# Patient Record
Sex: Female | Born: 1945
Health system: Southern US, Community
[De-identification: ages and names within clinical notes are randomized; demographics above are authoritative.]

## PROBLEM LIST (undated history)

## (undated) DIAGNOSIS — M069 Rheumatoid arthritis, unspecified: Secondary | ICD-10-CM

## (undated) DIAGNOSIS — I10 Essential (primary) hypertension: Secondary | ICD-10-CM

## (undated) DIAGNOSIS — D649 Anemia, unspecified: Secondary | ICD-10-CM

## (undated) DIAGNOSIS — K56609 Unspecified intestinal obstruction, unspecified as to partial versus complete obstruction: Secondary | ICD-10-CM

## (undated) DIAGNOSIS — Z720 Tobacco use: Secondary | ICD-10-CM

## (undated) DIAGNOSIS — E079 Disorder of thyroid, unspecified: Secondary | ICD-10-CM

## (undated) DIAGNOSIS — C189 Malignant neoplasm of colon, unspecified: Secondary | ICD-10-CM

## (undated) DIAGNOSIS — I77819 Aortic ectasia, unspecified site: Secondary | ICD-10-CM

## (undated) DIAGNOSIS — M3500 Sicca syndrome, unspecified: Secondary | ICD-10-CM

## (undated) DIAGNOSIS — E785 Hyperlipidemia, unspecified: Secondary | ICD-10-CM

## (undated) DIAGNOSIS — I2510 Atherosclerotic heart disease of native coronary artery without angina pectoris: Secondary | ICD-10-CM

## (undated) DIAGNOSIS — K760 Fatty (change of) liver, not elsewhere classified: Secondary | ICD-10-CM

## (undated) DIAGNOSIS — Z8679 Personal history of other diseases of the circulatory system: Secondary | ICD-10-CM

## (undated) DIAGNOSIS — I319 Disease of pericardium, unspecified: Secondary | ICD-10-CM

## (undated) DIAGNOSIS — Z148 Genetic carrier of other disease: Secondary | ICD-10-CM

## (undated) HISTORY — DX: Genetic carrier of other disease: Z14.8

## (undated) HISTORY — DX: Personal history of other diseases of the circulatory system: Z86.79

## (undated) HISTORY — PX: COLON SURGERY: SHX602

## (undated) HISTORY — DX: Disorder of thyroid, unspecified: E07.9

## (undated) HISTORY — DX: Tobacco use: Z72.0

## (undated) HISTORY — DX: Aortic ectasia, unspecified site: I77.819

## (undated) HISTORY — DX: Anemia, unspecified: D64.9

## (undated) HISTORY — DX: Sicca syndrome, unspecified: M35.00

## (undated) HISTORY — DX: Hyperlipidemia, unspecified: E78.5

## (undated) HISTORY — DX: Atherosclerotic heart disease of native coronary artery without angina pectoris: I25.10

## (undated) HISTORY — DX: Fatty (change of) liver, not elsewhere classified: K76.0

## (undated) HISTORY — DX: Unspecified intestinal obstruction, unspecified as to partial versus complete obstruction: K56.609

## (undated) HISTORY — DX: Disease of pericardium, unspecified: I31.9

---

## 1973-08-25 HISTORY — PX: DILATION AND CURETTAGE OF UTERUS: SHX78

## 1999-08-26 HISTORY — PX: KNEE SURGERY: SHX244

## 2004-08-25 HISTORY — PX: KNEE SURGERY: SHX244

## 2009-06-05 ENCOUNTER — Ambulatory Visit: Payer: Self-pay | Admitting: Family

## 2009-06-05 ENCOUNTER — Telehealth: Payer: Self-pay | Admitting: Family

## 2009-06-05 DIAGNOSIS — Z87891 Personal history of nicotine dependence: Secondary | ICD-10-CM

## 2009-06-05 DIAGNOSIS — E785 Hyperlipidemia, unspecified: Secondary | ICD-10-CM

## 2009-06-05 DIAGNOSIS — D510 Vitamin B12 deficiency anemia due to intrinsic factor deficiency: Secondary | ICD-10-CM

## 2009-06-05 DIAGNOSIS — M069 Rheumatoid arthritis, unspecified: Secondary | ICD-10-CM

## 2009-06-05 DIAGNOSIS — K219 Gastro-esophageal reflux disease without esophagitis: Secondary | ICD-10-CM

## 2009-06-05 DIAGNOSIS — E782 Mixed hyperlipidemia: Secondary | ICD-10-CM | POA: Insufficient documentation

## 2009-06-05 DIAGNOSIS — E039 Hypothyroidism, unspecified: Secondary | ICD-10-CM

## 2009-06-05 HISTORY — DX: Hypothyroidism, unspecified: E03.9

## 2009-06-05 HISTORY — DX: Rheumatoid arthritis, unspecified: M06.9

## 2009-06-05 HISTORY — DX: Vitamin B12 deficiency anemia due to intrinsic factor deficiency: D51.0

## 2009-06-05 HISTORY — DX: Personal history of nicotine dependence: Z87.891

## 2009-06-05 HISTORY — DX: Gastro-esophageal reflux disease without esophagitis: K21.9

## 2009-06-05 HISTORY — DX: Mixed hyperlipidemia: E78.2

## 2009-06-06 ENCOUNTER — Encounter: Payer: Self-pay | Admitting: Family

## 2009-06-06 LAB — CONVERTED CEMR LAB
Calcium: 8.9 mg/dL (ref 8.4–10.5)
Cholesterol: 168 mg/dL (ref 0–200)
Creatinine, Ser: 0.72 mg/dL (ref 0.40–1.20)
Eosinophils Absolute: 0.2 10*3/uL (ref 0.0–0.7)
Eosinophils Relative: 3 % (ref 0–5)
HDL: 35 mg/dL — ABNORMAL LOW (ref >39)
LDL Cholesterol: 96 mg/dL (ref 0–99)
Lymphs Abs: 1.2 10*3/uL (ref 0.7–4.0)
MCHC: 31.9 g/dL (ref 30.0–36.0)
MCV: 96.8 fL (ref 78.0–100.0)
Neutro Abs: 4.1 10*3/uL (ref 1.7–7.7)
Platelets: 228 10*3/uL (ref 150–400)
Potassium: 4.5 meq/L (ref 3.5–5.3)
RBC: 4.72 M/uL (ref 3.87–5.11)
RDW: 16.1 % — ABNORMAL HIGH (ref 11.5–15.5)
Sodium: 139 meq/L (ref 135–145)
TSH: 0.116 microintl units/mL — ABNORMAL LOW (ref 0.350–4.500)
Triglycerides: 184 mg/dL — ABNORMAL HIGH (ref <150)
VLDL: 37 mg/dL (ref 0–40)
Vitamin B-12: 440 pg/mL (ref 211–911)

## 2009-06-08 ENCOUNTER — Telehealth: Payer: Self-pay | Admitting: Family

## 2009-06-08 ENCOUNTER — Encounter: Payer: Self-pay | Admitting: Family

## 2009-07-23 ENCOUNTER — Encounter: Payer: Self-pay | Admitting: Family

## 2009-07-23 ENCOUNTER — Ambulatory Visit: Payer: Self-pay | Admitting: Internal Medicine

## 2009-07-23 LAB — CONVERTED CEMR LAB: TSH: 1.119 microintl units/mL (ref 0.350–4.500)

## 2009-07-26 ENCOUNTER — Encounter: Payer: Self-pay | Admitting: Family

## 2009-09-03 ENCOUNTER — Telehealth (INDEPENDENT_AMBULATORY_CARE_PROVIDER_SITE_OTHER): Payer: Self-pay | Admitting: *Deleted

## 2009-09-05 ENCOUNTER — Telehealth: Payer: Self-pay | Admitting: Family

## 2009-09-10 ENCOUNTER — Ambulatory Visit: Payer: Self-pay | Admitting: Family

## 2009-09-10 DIAGNOSIS — F329 Major depressive disorder, single episode, unspecified: Secondary | ICD-10-CM

## 2009-09-10 DIAGNOSIS — I10 Essential (primary) hypertension: Secondary | ICD-10-CM | POA: Insufficient documentation

## 2009-09-10 DIAGNOSIS — R030 Elevated blood-pressure reading, without diagnosis of hypertension: Secondary | ICD-10-CM

## 2009-09-10 DIAGNOSIS — J309 Allergic rhinitis, unspecified: Secondary | ICD-10-CM

## 2009-09-10 DIAGNOSIS — E119 Type 2 diabetes mellitus without complications: Secondary | ICD-10-CM

## 2009-09-10 DIAGNOSIS — F32A Depression, unspecified: Secondary | ICD-10-CM

## 2009-09-10 HISTORY — DX: Type 2 diabetes mellitus without complications: E11.9

## 2009-09-10 HISTORY — DX: Depression, unspecified: F32.A

## 2009-09-10 HISTORY — DX: Allergic rhinitis, unspecified: J30.9

## 2009-09-10 LAB — CONVERTED CEMR LAB
Albumin: 4.2 g/dL (ref 3.5–5.2)
Alkaline Phosphatase: 80 units/L (ref 39–117)
Basophils Absolute: 0 10*3/uL (ref 0.0–0.1)
Basophils Relative: 0 % (ref 0–1)
Calcium: 9.1 mg/dL (ref 8.4–10.5)
Creatinine, Ser: 0.74 mg/dL (ref 0.40–1.20)
Eosinophils Absolute: 0.1 10*3/uL (ref 0.0–0.7)
Eosinophils Relative: 3 % (ref 0–5)
HCT: 41.5 % (ref 36.0–46.0)
Lymphocytes Relative: 30 % (ref 12–46)
Lymphs Abs: 1.5 10*3/uL (ref 0.7–4.0)
Monocytes Absolute: 0.5 10*3/uL (ref 0.1–1.0)
Monocytes Relative: 9 % (ref 3–12)
Neutrophils Relative %: 58 % (ref 43–77)
RBC: 4.56 M/uL (ref 3.87–5.11)
RDW: 15.8 % — ABNORMAL HIGH (ref 11.5–15.5)
Total Bilirubin: 0.3 mg/dL (ref 0.3–1.2)
Total Protein: 7.5 g/dL (ref 6.0–8.3)
Vitamin B-12: >2000 pg/mL — ABNORMAL HIGH (ref 211–911)
WBC: 4.9 10*3/uL (ref 4.0–10.5)

## 2009-09-11 ENCOUNTER — Encounter: Payer: Self-pay | Admitting: Family

## 2009-09-11 ENCOUNTER — Telehealth (INDEPENDENT_AMBULATORY_CARE_PROVIDER_SITE_OTHER): Payer: Self-pay | Admitting: *Deleted

## 2009-10-01 ENCOUNTER — Ambulatory Visit: Payer: Self-pay | Admitting: Family

## 2009-10-09 ENCOUNTER — Ambulatory Visit: Payer: Self-pay | Admitting: Family

## 2009-11-02 ENCOUNTER — Encounter: Payer: Self-pay | Admitting: Family

## 2009-11-12 ENCOUNTER — Encounter: Payer: Self-pay | Admitting: Family

## 2009-11-15 ENCOUNTER — Telehealth: Payer: Self-pay | Admitting: Family

## 2009-11-16 ENCOUNTER — Ambulatory Visit: Payer: Self-pay | Admitting: Family

## 2009-11-16 ENCOUNTER — Ambulatory Visit: Payer: Self-pay | Admitting: Diagnostic Radiology

## 2009-11-16 ENCOUNTER — Ambulatory Visit (HOSPITAL_BASED_OUTPATIENT_CLINIC_OR_DEPARTMENT_OTHER): Admission: RE | Admit: 2009-11-16 | Discharge: 2009-11-16 | Payer: Self-pay | Admitting: Internal Medicine

## 2009-11-16 ENCOUNTER — Telehealth: Payer: Self-pay | Admitting: Family

## 2009-11-19 ENCOUNTER — Encounter (INDEPENDENT_AMBULATORY_CARE_PROVIDER_SITE_OTHER): Payer: Self-pay | Admitting: *Deleted

## 2009-11-19 ENCOUNTER — Telehealth: Payer: Self-pay | Admitting: Family

## 2009-11-19 ENCOUNTER — Ambulatory Visit: Payer: Self-pay | Admitting: Family

## 2009-11-20 ENCOUNTER — Encounter: Payer: Self-pay | Admitting: Family

## 2009-11-20 ENCOUNTER — Telehealth: Payer: Self-pay | Admitting: Family

## 2009-11-27 ENCOUNTER — Encounter: Payer: Self-pay | Admitting: Family

## 2009-11-28 ENCOUNTER — Encounter: Admission: RE | Admit: 2009-11-28 | Discharge: 2009-11-28 | Payer: Self-pay | Admitting: Internal Medicine

## 2009-11-28 LAB — HM MAMMOGRAPHY

## 2009-12-23 LAB — HM DEXA SCAN

## 2009-12-26 ENCOUNTER — Ambulatory Visit: Payer: Self-pay | Admitting: Family

## 2009-12-26 DIAGNOSIS — J45909 Unspecified asthma, uncomplicated: Secondary | ICD-10-CM

## 2009-12-26 HISTORY — DX: Unspecified asthma, uncomplicated: J45.909

## 2009-12-26 LAB — CONVERTED CEMR LAB
ALT: 24 units/L (ref 0–35)
Albumin: 4.5 g/dL (ref 3.5–5.2)
Alkaline Phosphatase: 76 units/L (ref 39–117)
BUN: 13 mg/dL (ref 6–23)
CO2: 25 meq/L (ref 19–32)
Calcium: 9.3 mg/dL (ref 8.4–10.5)
Chloride: 103 meq/L (ref 96–112)
Glucose, Bld: 88 mg/dL (ref 70–99)
HDL: 32 mg/dL — ABNORMAL LOW (ref >39)
Hgb A1c MFr Bld: 6.1 % — ABNORMAL HIGH (ref <5.7)
LDL Cholesterol: 112 mg/dL — ABNORMAL HIGH (ref 0–99)
Potassium: 4.7 meq/L (ref 3.5–5.3)
Sodium: 138 meq/L (ref 135–145)
Total Bilirubin: 0.4 mg/dL (ref 0.3–1.2)
VLDL: 38 mg/dL (ref 0–40)

## 2009-12-27 ENCOUNTER — Encounter: Payer: Self-pay | Admitting: Family

## 2009-12-27 ENCOUNTER — Telehealth: Payer: Self-pay | Admitting: Family

## 2010-01-02 ENCOUNTER — Encounter: Payer: Self-pay | Admitting: Family

## 2010-03-04 ENCOUNTER — Telehealth: Payer: Self-pay | Admitting: Family

## 2010-03-05 ENCOUNTER — Encounter: Payer: Self-pay | Admitting: Family

## 2010-03-06 LAB — CONVERTED CEMR LAB
ALT: 24 units/L (ref 0–35)
AST: 22 units/L (ref 0–37)
Basophils Absolute: 0 10*3/uL (ref 0.0–0.1)
Basophils Relative: 1 % (ref 0–1)
Eosinophils Relative: 3 % (ref 0–5)
HCT: 45.6 % (ref 36.0–46.0)
Hemoglobin: 14.6 g/dL (ref 12.0–15.0)
MCHC: 32 g/dL (ref 30.0–36.0)
Monocytes Relative: 8 % (ref 3–12)
Neutro Abs: 3.3 10*3/uL (ref 1.7–7.7)
Neutrophils Relative %: 54 % (ref 43–77)
RDW: 16.1 % — ABNORMAL HIGH (ref 11.5–15.5)

## 2010-03-07 ENCOUNTER — Encounter: Payer: Self-pay | Admitting: Family

## 2010-03-11 ENCOUNTER — Encounter: Payer: Self-pay | Admitting: Family

## 2010-03-13 ENCOUNTER — Ambulatory Visit: Payer: Self-pay | Admitting: Family

## 2010-03-25 ENCOUNTER — Telehealth: Payer: Self-pay | Admitting: Family

## 2010-04-24 ENCOUNTER — Ambulatory Visit: Payer: Self-pay | Admitting: Family

## 2010-05-06 ENCOUNTER — Telehealth: Payer: Self-pay | Admitting: Family

## 2010-05-15 ENCOUNTER — Ambulatory Visit: Payer: Self-pay | Admitting: Family

## 2010-05-15 DIAGNOSIS — E559 Vitamin D deficiency, unspecified: Secondary | ICD-10-CM | POA: Insufficient documentation

## 2010-05-15 HISTORY — DX: Vitamin D deficiency, unspecified: E55.9

## 2010-05-15 LAB — CONVERTED CEMR LAB
Basophils Absolute: 0 10*3/uL (ref 0.0–0.1)
Eosinophils Absolute: 0.1 10*3/uL (ref 0.0–0.7)
Eosinophils Relative: 1 % (ref 0–5)
Hemoglobin: 15 g/dL (ref 12.0–15.0)
Lymphocytes Relative: 20 % (ref 12–46)
Lymphs Abs: 1.4 10*3/uL (ref 0.7–4.0)
MCHC: 34.2 g/dL (ref 30.0–36.0)
Monocytes Relative: 6 % (ref 3–12)
Neutrophils Relative %: 72 % (ref 43–77)
Platelets: 201 10*3/uL (ref 150–400)
RBC: 4.85 M/uL (ref 3.87–5.11)
RDW: 15.3 % (ref 11.5–15.5)
Vit D, 1,25-Dihydroxy: 47 (ref 30–89)
WBC: 7.1 10*3/uL (ref 4.0–10.5)

## 2010-05-17 ENCOUNTER — Encounter: Payer: Self-pay | Admitting: Family

## 2010-05-20 ENCOUNTER — Ambulatory Visit: Payer: Self-pay | Admitting: Family

## 2010-05-20 ENCOUNTER — Ambulatory Visit: Payer: Self-pay | Admitting: Diagnostic Radiology

## 2010-05-20 ENCOUNTER — Ambulatory Visit (HOSPITAL_BASED_OUTPATIENT_CLINIC_OR_DEPARTMENT_OTHER): Admission: RE | Admit: 2010-05-20 | Discharge: 2010-05-20 | Payer: Self-pay | Admitting: Internal Medicine

## 2010-05-20 LAB — CONVERTED CEMR LAB
Glucose, Urine, Semiquant: NEGATIVE
Specific Gravity, Urine: 1.01

## 2010-05-22 ENCOUNTER — Inpatient Hospital Stay (HOSPITAL_COMMUNITY): Admission: EM | Admit: 2010-05-22 | Discharge: 2010-05-25 | Payer: Self-pay | Admitting: Emergency Medicine

## 2010-05-22 ENCOUNTER — Ambulatory Visit: Payer: Self-pay | Admitting: Family

## 2010-06-03 ENCOUNTER — Ambulatory Visit: Payer: Self-pay | Admitting: Family

## 2010-06-04 ENCOUNTER — Encounter: Payer: Self-pay | Admitting: Family

## 2010-06-04 ENCOUNTER — Encounter: Payer: Self-pay | Admitting: Internal Medicine

## 2010-06-04 ENCOUNTER — Telehealth: Payer: Self-pay | Admitting: Family

## 2010-07-08 ENCOUNTER — Ambulatory Visit: Payer: Self-pay | Admitting: Family

## 2010-07-09 ENCOUNTER — Telehealth: Payer: Self-pay | Admitting: Family

## 2010-08-25 DIAGNOSIS — C189 Malignant neoplasm of colon, unspecified: Secondary | ICD-10-CM

## 2010-08-25 HISTORY — DX: Malignant neoplasm of colon, unspecified: C18.9

## 2010-09-10 ENCOUNTER — Encounter: Payer: Self-pay | Admitting: Family

## 2010-09-17 ENCOUNTER — Ambulatory Visit
Admission: RE | Admit: 2010-09-17 | Discharge: 2010-09-17 | Payer: Self-pay | Source: Home / Self Care | Attending: Family | Admitting: Family

## 2010-09-17 ENCOUNTER — Telehealth: Payer: Self-pay | Admitting: Family

## 2010-09-17 DIAGNOSIS — L989 Disorder of the skin and subcutaneous tissue, unspecified: Secondary | ICD-10-CM | POA: Insufficient documentation

## 2010-09-17 LAB — CONVERTED CEMR LAB
BUN: 16 mg/dL (ref 6–23)
CO2: 26 meq/L (ref 19–32)
Calcium: 9 mg/dL (ref 8.4–10.5)
Creatinine, Ser: 0.89 mg/dL (ref 0.40–1.20)
Glucose, Bld: 99 mg/dL (ref 70–99)
Hgb A1c MFr Bld: 6.2 % — ABNORMAL HIGH (ref <5.7)
Potassium: 4.6 meq/L (ref 3.5–5.3)

## 2010-09-18 ENCOUNTER — Encounter: Payer: Self-pay | Admitting: Family

## 2010-09-19 ENCOUNTER — Telehealth: Payer: Self-pay | Admitting: Family

## 2010-09-23 ENCOUNTER — Telehealth: Payer: Self-pay | Admitting: Family

## 2010-09-24 ENCOUNTER — Telehealth: Payer: Self-pay | Admitting: Family

## 2010-09-24 NOTE — Progress Notes (Signed)
Summary: b12 result, mailed letter  Phone Note Outgoing Call   Summary of Call: Please call patient and let her know B12 level was normal, but I would like to schedule a follow up B12 level drawn in 6 weeks (281). Thanks Initial call taken by: [name] FNP,  Dec 27, 2009 8:36 AM  Follow-up for Phone Call        Left message on machine to return my call.  Mervin Kung CMA  Dec 27, 2009 9:15 AM   Left message on machine to return my call.  Mervin Kung CMA  Dec 28, 2009 8:49 AM   Left message on machine to return my call.  Mervin Kung CMA  Dec 31, 2009 4:28 PM   Additional Follow-up for Phone Call Additional follow up Details #1::        Unable to reach pt by phone.  Letter mailed to pt. with Lab appt. and instructions.  Mervin Kung CMA  Jan 02, 2010 9:30 AM

## 2010-09-24 NOTE — Assessment & Plan Note (Signed)
Summary: nurse visit for tb test & CXR / tf,cma   Vital Signs:  Patient profile:   65 year old female Pulse rate:   88 / minute Pulse rhythm:   regular BP sitting:   100 / 76  (right arm) Cuff size:   large  Vitals Entered By: Mervin Kung CMA (November 16, 2009 1:46 PM) CC: Pt here for nurse visit to have PPD skin test and CXR per Dr. Corliss Skains to r/o TB.    CC:  Pt here for nurse visit to have PPD skin test and CXR per Dr. Corliss Skains to r/o TB. Marland Kitchen  Allergies: 1)  ! Levaquin   Impression & Recommendations:  Problem # 1:  RHEUMATOID ARTHRITIS (ICD-714.0)  Dr. Corliss Skains is requesting routine  PPD and CXR due to ongoing  immunosuppressant therapy.  Orders: CXR- 2view (CXR)  Complete Medication List: 1)  Advair Diskus 100-50 Mcg/dose Aepb (Fluticasone-salmeterol) .Marland Kitchen.. 1puff by mouth two times a day 2)  Ventolin Hfa 108 (90 Base) Mcg/act Aers (Albuterol sulfate) .Marland Kitchen.. 1 puff by mouth once daily as needed 3)  Methotrexate 2.5 Mg Tabs (Methotrexate sodium) .Marland Kitchen.. 10  tablets by mouth once a week 4)  Pantoprazole Sodium 40 Mg Tbec (Pantoprazole sodium) .... Take 1 tablet by mouth once a day 5)  Ambien Cr 12.5 Mg Cr-tabs (Zolpidem tartrate) .... Take 1 tab by mouth at bedtime 6)  Cyanocobalamin 1000 Mcg/ml Soln (Cyanocobalamin) .... Inject im once per month 7)  Diclofenac Sodium 75 Mg Tbec (Diclofenac sodium) .... Take 1 tablet by mouth two times a day as needed 8)  Fluticasone Propionate 50 Mcg/act Susp (Fluticasone propionate) .... 2 sprays each nostril once daily 9)  Zetia 10 Mg Tabs (Ezetimibe) .... Take 1 tablet by mouth once a day 10)  Levothroid 100 Mcg Tabs (Levothyroxine sodium) .... Take one tablet once daily 11)  Lidoderm 5 % Ptch (Lidocaine) .... Apply once daily for 12 hours a day as needed to affected areas  Other Orders: TB Skin Test [id number]) Admin 1st Vaccine (98119)    Immunizations Administered:  PPD Skin Test:    Vaccine Type: PPD    Site: right forearm  Mfr: San[employer]i Pasteur    Dose: 0.1 ml    Route: ID    Given by: Mervin Kung CMA    Exp. Date: 10/31/2011    Lot #: [id number]

## 2010-09-24 NOTE — Progress Notes (Signed)
  Phone Note Outgoing Call   Summary of Call: Please call patient and let her know that her TSH is normal she should continue current dose of thyroid medication and that I have reviewed the guidelines for the Zostavax, and we unfortunately will not be able to give it to her because it is a live vaccine which she cannot take while on her rheumatoid medications. Initial call taken by: [name] FNP,  July 09, 2010 9:17 AM     Appended Document:  Left message on machine to return my call.  Appended Document:  Left message on machine to return my call.  Appended Document:  Left detailed message on home phone re: thyroid result and zostavax per above note and to call if pt has any questions.

## 2010-09-24 NOTE — Letter (Signed)
Summary: Records Dated 10-13-03 thru 01-05-09/[facility]  Records Dated 10-13-03 thru 01-05-09/[facility], [place], [place]   Imported By: Lanelle Bal 09/06/2009 11:18:00  _____________________________________________________________________  External Attachment:    Type:   Image     Comment:   External Document

## 2010-09-24 NOTE — Letter (Signed)
Summary: Property Tax Form/[place] [employer]  Property Tax Form/[place] [employer]   Imported By: Lanelle Bal 06/14/2010 12:30:06  _____________________________________________________________________  External Attachment:    Type:   Image     Comment:   External Document

## 2010-09-24 NOTE — Progress Notes (Signed)
Summary: standing lab order:  sgot/sgpt/cbc diff/ albumin/ creat  Phone Note Call from Patient   Caller: Patient Call For: Shoaib Siefker  Summary of Call: she wants to have her blood work done before her appt on 7-20. Please fax order to the lab.  Dr Charlton Amor is sending an order for the labs he wants drawn  Initial call taken by: Roselle Locus,  March 04, 2010 2:43 PM  Follow-up for Phone Call        Spoke with nurse, she will refax the order.  Nicki Guadalajara Fergerson CMA Duncan Dull)  March 05, 2010 11:49 AM   Additional Follow-up for Phone Call Additional follow up Details #1::        Received order from Dr. Corliss Skains for CBC w/Diff, SGOT/SGPT, Creat / ALB--v58.69 every 6 - 8 weeks until January 2012.     Are there any other labs you want pt to have?  Nicki Guadalajara Fergerson CMA Duncan Dull)  March 05, 2010 1:15 PM     Additional Follow-up for Phone Call Additional follow up Details #2::    Could we please also add a B12 level? ( ICD-9 281) Follow-up by: [name] FNP,  March 05, 2010 4:25 PM  Additional Follow-up for Phone Call Additional follow up Details #3:: Details for Additional Follow-up Action Taken: Notified pt. we will send order to [facility]. Pt will go tomorrow. She is aware that labs will need to be repeated every 6 to 8 weeks. Pt has appt with Dr. Corliss Skains on Thursday. I advised pt that labs may not get to Dr Corliss Skains before her appt.  Pt voices understanding.  Lab order faxed to [facility]. Nicki Guadalajara Fergerson CMA Duncan Dull)  March 05, 2010 5:21 PM

## 2010-09-24 NOTE — Letter (Signed)
Summary: Primary Care Consult Scheduled Letter  [name] [place]  [address] [place], [place] [address]   Phone: [phone]  Fax: [phone]      11/19/2009 MRN: [id number]  Sheryl [name] [address] [place], [place]  [address]    Dear Ms. Mower,    We have scheduled an appointment for you.  At the recommendation of Sandford Craze, FNP, we have scheduled you for a Bone Density Scan with [facility] on 12-20-2009 at 9:00am.  Their address is [address] [place], [place] [address]. The office phone number is [phone].  If this appointment day and time is not convenient for you, please feel free to call the office of the doctor you are being referred to at the number listed above and reschedule the appointment.    It is important for you to keep your scheduled appointments. We are here to make sure you are given good patient care.   Thank you,    Renee, Patient Care Coordinator [place] [facility]

## 2010-09-24 NOTE — Assessment & Plan Note (Signed)
Summary: 2 DAY FOLLOW UP/MHF   Vital Signs:  Patient profile:   66 year old female O2 Sat:      93 % on Room air Temp:     98.3 degrees F oral Pulse rate:   91 / minute Pulse rhythm:   regular Resp:     26 per minute BP sitting:   140 / 80  (left arm) Cuff size:   large  Vitals Entered By: Mervin Kung CMA (AAMA) (May 22, 2010 10:50 AM)  O2 Flow:  Room air  Primary Care Latishia Suitt:  [name] FNP   History of Present Illness: Sheryl [name] is a 65 year  old female who presents today for follow up of her acute asthma exacerbation/bronchitis.  She was see on Monday for Bronchitis/asthma and was started on prednisone and zithromax.  She was also given a dose of solumedrol here in the office.  Pt notes that she awoke this AM at about 6AM and was unable to breath.  She called EMS and they came to her home and provided oxygen.  The oxygen helped significantly.  Symptoms are worse when she is ambulating. Symptoms are accompanied by anxiety. The patient is accompanied today by her Nephew.    Allergies: 1)  ! Levaquin 2)  ! Ceftin (Cefuroxime Axetil)  Past History:  Past Medical History: Last updated: 05/15/2010 Hyperlipidemia Hypothyroidism rheumatoid arthitis pernicious anemia Asthma DM 2 Tobacco abuse hyperlipidemia  Past Surgical History: Last updated: 06/05/2009 2006 Left Knee Arthrocopic surgery 2001 Right Knee GYN surgery D& C 1975  Family History: Reviewed history from 06/05/2009 and no changes required. Family History of CAD Female 1st degree relative <60 Family History of CAD Female 1st degree relative <50 Family History Diabetes 1st degree relative Family History High cholesterol Family History of Stroke F 1st degree relative <60  Social History: Reviewed history from 06/05/2009 and no changes required. Occupation: Unemployed Single 1 son age 66   Review of Systems       + SOB, +cough, had fever yesterday-none today  Physical  Exam  General:  Initially very anxious and appeared in distress.  After she received the neb treatment she was breathing without difficulty and was calm. Head:  Normocephalic and atraumatic without obvious abnormalities. No apparent alopecia or balding. Lungs:  normal respiratory effort and no accessory muscle use.  Bilateral expiratory wheeze.  Decreased air movement to bases Heart:  Normal rate and regular rhythm. S1 and S2 normal without gallop, murmur, click, rub or other extra sounds. Extremities:  No clubbing, cyanosis, edema, or deformity noted with normal full range of motion of all joints.   Neurologic:  alert & oriented X3 and gait normal.   Psych:  Oriented X3, memory intact for recent and remote, and severely anxious prior to nebulizer.   Impression & Recommendations:  Problem # 1:  ASTHMA, WITH ACUTE EXACERBATION (JOA-416.60) Assessment Deteriorated  Patient has not responded to outpatient treatment.  Patient was instructed to go to the emergency room for evaluation and admission.  Spoke with Dr. Katherine Roan at [facility] ED (this is family's preference of hospital) and gave her report.  Patient will likely need IV steroids and nebs.   Her updated medication list for this problem includes:    Advair Diskus 250-50 Mcg/dose Aepb (Fluticasone-salmeterol) ..... One puff twice daily    Ventolin Hfa 108 (90 Base) Mcg/act Aers (Albuterol sulfate) .Marland Kitchen... 2 puffs every 6 hours as needed    Prednisone 10 Mg Tabs (Prednisone) .Marland KitchenMarland KitchenMarland KitchenMarland Kitchen  Take 4 tablets daily x 2 days, then 3 tabs daily x 2 days, then 2 tabs daily x 2 days, then 1 tab daily for 2 days then stop.  Orders: Prescription Created Electronically [phone])  Complete Medication List: 1)  Advair Diskus 250-50 Mcg/dose Aepb (Fluticasone-salmeterol) .... One puff twice daily 2)  Ventolin Hfa 108 (90 Base) Mcg/act Aers (Albuterol sulfate) .... 2 puffs every 6 hours as needed 3)  Pantoprazole Sodium 40 Mg Tbec (Pantoprazole sodium) .... Take 1  tablet by mouth once a day 4)  Ambien 5 Mg Tabs (Zolpidem tartrate) .... One tablet by mouth at bedtime as needed 5)  Diclofenac Sodium 75 Mg Tbec (Diclofenac sodium) .... Take 1 tablet by mouth two times a day as needed 6)  Fluticasone Propionate 50 Mcg/act Susp (Fluticasone propionate) .... 2 sprays each nostril once daily as needed 7)  Zetia 10 Mg Tabs (Ezetimibe) .... Take 1 tablet by mouth once a day 8)  Levothroid 100 Mcg Tabs (Levothyroxine sodium) .... Take one tablet once daily 9)  Patanol 0.1 % Soln (Olopatadine hcl) .... One drop in each eye twice daily as needed 10)  Methotrexate Sodium 25 Mg/ml Soln (Methotrexate sodium) .... Inject 1 time a week. 11)  B Complex Tabs (B complex vitamins) .... One tablet by mouth daily 12)  Plaquenil 200 Mg Tabs (Hydroxychloroquine sulfate) .... Take 1 tablet by mouth two times a day 13)  Vitamin D 1000 Unit Tabs (Cholecalciferol) .... 2 tabs by mouth daily 14)  Vitamin E 1000 Unit Caps (Vitamin e) .... On cap 3x weekly 15)  Fish Oil Double Strength 1200 Mg Caps (Omega-3 fatty acids) .... One cap three times a day 16)  Aspirin Ec 81 Mg Tbec (Aspirin) .... One tab by mouth daily 17)  Zithromax 250 Mg Tabs (Azithromycin) .... 2 tabs by mouth today, then once daily for 4 more days. 18)  Prednisone 10 Mg Tabs (Prednisone) .... Take 4 tablets daily x 2 days, then 3 tabs daily x 2 days, then 2 tabs daily x 2 days, then 1 tab daily for 2 days then stop. 19)  Tessalon Perles 100 Mg Caps (Benzonatate) .... One cap by mouth three times a day as needed cough  Patient Instructions: 1)  A prescription has been sent to your pharmacy for a higher dose of advair. Please start this medication.  2)  Go directly downstairs to the ED for evaluation. Prescriptions: ADVAIR DISKUS 250-50 MCG/DOSE AEPB (FLUTICASONE-SALMETEROL) one puff twice daily  #1 x 2   Entered and Authorized by:   [name] FNP   Signed by:   [name] FNP on 05/22/2010    Method used:   Electronically to        [facility] (retail)       [address]       [place], [place]  [address]       Ph: [phone]       Fax: [phone]   RxID:   [id number]    Medication Administration  Medication # 1:    Medication: Albuterol Sulfate Sol 1mg  unit dose    Diagnosis: ASTHMA, WITH ACUTE EXACERBATION (ICD-493.92)    Dose: 2.5mg  / 3 mL    Route: inhaled    Exp Date: 02/22/2011    Lot #: [id number]    Mfr: [employer]    Patient tolerated medication without complications    Given by: Mervin Kung CMA Duncan Dull) (May 22, 2010 11:16 AM)  Orders Added: 1)  Prescription Created Electronically [G8553] 2)  Est. Patient Level III [[address]]

## 2010-09-24 NOTE — Letter (Signed)
Summary: [facility]  [facility]   Imported By: Maryln Gottron 04/02/2010 11:10:58  _____________________________________________________________________  External Attachment:    Type:   Image     Comment:   External Document

## 2010-09-24 NOTE — Progress Notes (Signed)
Summary: additional mammogram views  Phone Note Outgoing Call   Summary of Call: Reviewed mammogram report- abnormal. Left message for patient to call us.  I want to verify that they have arranged further imaging of both her breasts.  Initial call taken by: [name] FNP,  November 20, 2009 5:46 PM  Follow-up for Phone Call        Nicki Guadalajara patient returned your call & she will be home all day today, please call her back. Follow-up by: Magdalen Spatz [place],  November 21, 2009 9:41 AM  Additional Follow-up for Phone Call Additional follow up Details #1::        Notified pt. of abnormal mammogram bilaterally and need for additional images.  Additional views have not been scheduled. Advised pt. we would contact her with appt. date and time.  Mervin Kung CMA  November 21, 2009 10:55 AM     Additional Follow-up for Phone Call Additional follow up Details #2::    Spoke with [place] scheduler at the [facility].  She will set up additional imaging and will contact patient. Follow-up by: [name] FNP,  November 21, 2009 2:02 PM

## 2010-09-24 NOTE — Assessment & Plan Note (Signed)
Summary: follow up/mhf   Vital Signs:  Patient profile:   65 year old female Weight:      208 pounds Temp:     98.3 degrees F oral Pulse rate:   84 / minute BP sitting:   160 / 84  (right arm)  Vitals Entered By: Doristine Devoid (September 10, 2009 2:13 PM) CC: f/u on meds and labwork    Primary Care Provider:  [name] FNP  CC:  f/u on meds and labwork .  History of Present Illness: Sheryl [name] is a 65 year old female who presents today for follow up.  Notes that she has not been compliant with earlier recommendations due to recent loss of god-child who committed suicide.  This has been a source of great stress to the patient.  Patient notes that she had a sore throat/cough productive of green phlegm- this occurred in 08/03/2023.  Those symptoms resolved. Notes that this past week she has had some runny nose.  Denies associated fever.  RA- patient cancelled referral to rheumatology back in 08-03-2023 due to above death- she was out of town.      Allergies: 1)  ! Levaquin  Review of Systems       Notes that she feels depressed, denies suicide ideation.  Feels like it has been hard to take care of herself.    Physical Exam  General:  Well-developed,well-nourished,in no acute distress; alert,appropriate and cooperative throughout examination Neck:  No deformities, masses, or tenderness noted. Lungs:  Normal respiratory effort, chest expands symmetrically. Lungs are clear to auscultation, no crackles or wheezes. Heart:  Normal rate and regular rhythm. S1 and S2 normal without gallop, murmur, click, rub or other extra sounds. Psych:  Seems more subdued this visit.  good eye contact and not anxious appearing.     Impression & Recommendations:  Problem # 1:  HYPOTHYROIDISM (ICD-244.9) Assessment Comment Only Last TSH normal will complete today.   Her updated medication list for this problem includes:    Levothroid 100 Mcg Tabs (Levothyroxine sodium) .Marland Kitchen... Take one  tablet once daily  Orders: T-TSH (52841-32440)  Problem # 2:  PERNICIOUS ANEMIA (ICD-281.0) Assessment: New Patient was receiving B12 injections prior to moving to [place], last B12 level was low limit normal, but patient had just had injection, will give today, check level and plan for monthly injections.  Stressed importance of overal compliance with patient who verbalizes understanding Her updated medication list for this problem includes:    Cyanocobalamin 1000 Mcg/ml Soln (Cyanocobalamin) ..... Inject im once per month  Orders: T-Vitamin B12 ([id number]) Vit B12 1000 mcg (J3420) Admin of Therapeutic Inj  intramuscular or subcutaneous (40347)  Hgb: 14.6 (06/05/2009)   Hct: 45.7 (06/05/2009)   Platelets: 228 (06/05/2009) RBC: 4.72 (06/05/2009)   RDW: 16.1 (06/05/2009)   WBC: 6.0 (06/05/2009) MCV: 96.8 (06/05/2009)   MCHC: 31.9 (06/05/2009) B12: 440 (06/05/2009)   TSH: 1.119 (07/23/2009)  Problem # 3:  RHEUMATOID ARTHRITIS (ICD-714.0) Assessment: Unchanged Patient instructed to schedule appointment with Rheum ASAP.  She continues on large dose Methotrexate which I stressed to her is best managed by rheumaotology- she agrees to reschedule apt.   Orders: T-Comprehensive Metabolic Panel ([phone]) T-CBC w/Diff [phone]) T-ESR [facility]) (85652-ESR)  Problem # 4:  HYPERGLYCEMIA, MILD (ICD-790.29) Noted mild hyperglycemia, will check A1C Orders: T- Hemoglobin A1C ([id number])  Problem # 5:  DEPRESSIVE DISORDER NOT ELSEWHERE CLASSIFIED (ICD-311) Assessment: Deteriorated Offered antidepressant- I feel that this is contributing to her ability to self  care.  Patient declines med.  "I am on too many medicines already".  Initially agreed to psychology referral, but later declined.   Orders: Psychology Referral (Psychology)  Problem # 6:  ELEVATED BLOOD PRESSURE WITHOUT DIAGNOSIS OF HYPERTENSION (ICD-796.2) Assessment: Deteriorated Plan to repeat in 2 weeks, if remains elevated  will initiate med. therapy.  Patient advised to follow a low sodium diet.   BP today: 160/84 Prior BP: 136/90 (06/05/2009)  Labs Reviewed: Creat: 0.72 (06/05/2009) Chol: 168 (06/05/2009)   HDL: 35 (06/05/2009)   LDL: 96 (06/05/2009)   TG: 184 (06/05/2009)  Instructed in low sodium diet (DASH Handout) and behavior modification.    Problem # 7:  ALLERGIC RHINITIS (ICD-477.9) Assessment: Unchanged  Her updated medication list for this problem includes:    Fluticasone Propionate 50 Mcg/act Susp (Fluticasone propionate) .Marland Kitchen... 2 sprays each nostril once daily  Complete Medication List: 1)  Advair Diskus 100-50 Mcg/dose Aepb (Fluticasone-salmeterol) .Marland Kitchen.. 1puff by mouth two times a day 2)  Ventolin Hfa 108 (90 Base) Mcg/act Aers (Albuterol sulfate) .Marland Kitchen.. 1 puff by mouth once daily as needed 3)  Methotrexate 2.5 Mg Tabs (Methotrexate sodium) .Marland Kitchen.. 10  tablets by mouth once a week 4)  Pantoprazole Sodium 40 Mg Tbec (Pantoprazole sodium) .... Take 1 tablet by mouth once a day 5)  Ambien Cr 12.5 Mg Cr-tabs (Zolpidem tartrate) .... Take 1 tab by mouth at bedtime 6)  Cyanocobalamin 1000 Mcg/ml Soln (Cyanocobalamin) .... Inject im once per month 7)  Diclofenac Sodium 75 Mg Tbec (Diclofenac sodium) .... Take 1 tablet by mouth two times a day as needed 8)  Fluticasone Propionate 50 Mcg/act Susp (Fluticasone propionate) .... 2 sprays each nostril once daily 9)  Zetia 10 Mg Tabs (Ezetimibe) .... Take 1 tablet by mouth once a day 10)  Levothroid 100 Mcg Tabs (Levothyroxine sodium) .... Take one tablet once daily 11)  Lidoderm 5 % Ptch (Lidocaine) .... Apply once daily for 12 hours a day as needed to affected areas  Patient Instructions: 1)  Complete your blood work this afternoon. 2)  Please return in 2 weeks for a follow BP check. 3)  Reschedule your appointment with Rheumatology ASAP. 4)  Follow up in 3 months for a full appointment. 5)  Schedule monthly b12 injection.   Medication  Administration  Injection # 1:    Medication: Vit B12 1000 mcg    Diagnosis: PERNICIOUS ANEMIA (ICD-281.0)    Route: IM    Site: L deltoid    Exp Date: 03/26/2011    Lot #: [id number]    Mfr: [employer]    Given by: Doristine Devoid (September 10, 2009 3:40 PM)  Orders Added: 1)  T-Comprehensive Metabolic Panel [[id number]] 2)  T-TSH [[id number]] 3)  T-CBC w/Diff [[id number]] 4)  T-ESR [facility]) [21308-MVH] 5)  T-Vitamin B12 [82607-23330] 6)  T- Hemoglobin A1C [83036-23375] 7)  Psychology Referral [Psychology] 8)  Vit B12 1000 mcg [J3420] 9)  Admin of Therapeutic Inj  intramuscular or subcutaneous [96372] 10)  Est. Patient Level III [84696]

## 2010-09-24 NOTE — Progress Notes (Signed)
----   Converted from flag ---- ---- 09/10/2009 3:08 PM, [name] FNP wrote: Please hold off on scheduling psychology referral, patient wishes to wait. ------------------------------  Phone Note Outgoing Call   Details for Reason: referral Summary of Call: No referral made Initial call taken by: Darral Dash,  September 11, 2009 2:29 PM

## 2010-09-24 NOTE — Consult Note (Signed)
Summary: [facility]  [facility]   Imported By: Lanelle Bal 11/20/2009 10:47:55  _____________________________________________________________________  External Attachment:    Type:   Image     Comment:   External Document

## 2010-09-24 NOTE — Assessment & Plan Note (Signed)
Summary: B 12 INJ  Nurse Visit   Allergies: 1)  ! Levaquin  Medication Administration  Injection # 1:    Medication: Vit B12 1000 mcg    Diagnosis: PERNICIOUS ANEMIA (ICD-281.0)    Route: IM    Site: L deltoid    Exp Date: 06/26/2011    Lot #: [id number]    Mfr: [employer]    Given by: Doristine Devoid (October 09, 2009 2:59 PM)  Orders Added: 1)  Vit B12 1000 mcg [J3420] 2)  Admin of Therapeutic Inj  intramuscular or subcutaneous [96372]   Medication Administration  Injection # 1:    Medication: Vit B12 1000 mcg    Diagnosis: PERNICIOUS ANEMIA (ICD-281.0)    Route: IM    Site: L deltoid    Exp Date: 06/26/2011    Lot #: [id number]    Mfr: [employer]    Given by: Doristine Devoid (October 09, 2009 2:59 PM)  Orders Added: 1)  Vit B12 1000 mcg [J3420] 2)  Admin of Therapeutic Inj  intramuscular or subcutaneous [96045]

## 2010-09-24 NOTE — Progress Notes (Signed)
Summary: wants bone density  Phone Note Call from Patient   Summary of Call: Pt. states it is time for her bone density. Her last one was done in [place].  She would like something set up about 1 month from now at the [place] office. Initial call taken by: Mervin Kung CMA,  November 19, 2009 8:14 AM

## 2010-09-24 NOTE — Assessment & Plan Note (Signed)
Summary: nurse visit for tb reading / tf,cma   Primary Care Provider:  [name] FNP   History of Present Illness: The patient presented after 48 hours to check the injection site for positive or negative reaction.  Injection site examination: No firm bump forms at the test site.  Slightly reddish appearance and diameter was smaller than 5mm.  Assessment & Plan: Negative TB skin test.  Patient was counselled to call if experiences any irritation of site.   Sheryl [name] CMA  November 19, 2009 4:34 PM   Allergies: 1)  ! Levaquin   Complete Medication List: 1)  Advair Diskus 100-50 Mcg/dose Aepb (Fluticasone-salmeterol) .Marland Kitchen.. 1puff by mouth two times a day 2)  Ventolin Hfa 108 (90 Base) Mcg/act Aers (Albuterol sulfate) .Marland Kitchen.. 1 puff by mouth once daily as needed 3)  Methotrexate 2.5 Mg Tabs (Methotrexate sodium) .Marland Kitchen.. 10  tablets by mouth once a week 4)  Pantoprazole Sodium 40 Mg Tbec (Pantoprazole sodium) .... Take 1 tablet by mouth once a day 5)  Ambien Cr 12.5 Mg Cr-tabs (Zolpidem tartrate) .... Take 1 tab by mouth at bedtime 6)  Cyanocobalamin 1000 Mcg/ml Soln (Cyanocobalamin) .... Inject im once per month 7)  Diclofenac Sodium 75 Mg Tbec (Diclofenac sodium) .... Take 1 tablet by mouth two times a day as needed 8)  Fluticasone Propionate 50 Mcg/act Susp (Fluticasone propionate) .... 2 sprays each nostril once daily 9)  Zetia 10 Mg Tabs (Ezetimibe) .... Take 1 tablet by mouth once a day 10)  Levothroid 100 Mcg Tabs (Levothyroxine sodium) .... Take one tablet once daily 11)  Lidoderm 5 % Ptch (Lidocaine) .... Apply once daily for 12 hours a day as needed to affected areas 12)  Patanol 0.1 % Soln (Olopatadine hcl) .... One drop in each eye twice daily     PPD Results    Date of reading: 11/19/2009    Results: < 5mm    Interpretation: negative

## 2010-09-24 NOTE — Assessment & Plan Note (Signed)
Summary: B P CHECK/MHF   Vital Signs:  Patient profile:   65 year old female Weight:      207 pounds Pulse rate:   72 / minute BP sitting:   130 / 76  (left arm)  Vitals Entered By: Doristine Devoid (October 01, 2009 2:20 PM) CC: BP CHECK AND REFILL ON MEDS    Primary Care Provider:  [name] FNP  CC:  BP CHECK AND REFILL ON MEDS .  History of Present Illness: Sheryl [name] is a 65 year old female who presents today for follow up of her blood pressure.  She attributes her high blood pressure to taking a cold prep. prior to last visit.  Notes that she is scheduled to establish with rheumatology in March.    Allergies: 1)  ! Levaquin  Physical Exam  General:  Well-developed,well-nourished,in no acute distress; alert,appropriate and cooperative throughout examination Psych:  Calm, mood better this visit, smiling more, normally interactive and good eye contact.     Impression & Recommendations:  Problem # 1:  ELEVATED BLOOD PRESSURE WITHOUT DIAGNOSIS OF HYPERTENSION (ICD-796.2) Assessment Improved F/u BP normal. BP today: 130/76 Prior BP: 160/84 (09/10/2009)  Labs Reviewed: Creat: 0.74 (09/10/2009) Chol: 168 (06/05/2009)   HDL: 35 (06/05/2009)   LDL: 96 (06/05/2009)   TG: 184 (06/05/2009)  Instructed in low sodium diet (DASH Handout) and behavior modification.    Problem # 2:  RHEUMATOID ARTHRITIS (ICD-714.0) Assessment: New Patient has appointment with rheumatology. Continue methotrexate  Problem # 3:  HYPERGLYCEMIA, MILD (ICD-790.29) Assessment: New Noted A1C is 6.6, patient instructed to follow diabetic diet and increase exercise.  Plan f/u A1C in 3 months.    Problem # 4:  DEPRESSIVE DISORDER NOT ELSEWHERE CLASSIFIED (ICD-311) Assessment: Improved Patient notes that her symptoms are improved at this time.   Problem # 5:  PERNICIOUS ANEMIA (ICD-281.0) Assessment: Comment Only Patient notes feeling better since resuming B12 injections- will continue.    Her updated medication list for this problem includes:    Cyanocobalamin 1000 Mcg/ml Soln (Cyanocobalamin) ..... Inject im once per month  Complete Medication List: 1)  Advair Diskus 100-50 Mcg/dose Aepb (Fluticasone-salmeterol) .Marland Kitchen.. 1puff by mouth two times a day 2)  Ventolin Hfa 108 (90 Base) Mcg/act Aers (Albuterol sulfate) .Marland Kitchen.. 1 puff by mouth once daily as needed 3)  Methotrexate 2.5 Mg Tabs (Methotrexate sodium) .Marland Kitchen.. 10  tablets by mouth once a week 4)  Pantoprazole Sodium 40 Mg Tbec (Pantoprazole sodium) .... Take 1 tablet by mouth once a day 5)  Ambien Cr 12.5 Mg Cr-tabs (Zolpidem tartrate) .... Take 1 tab by mouth at bedtime 6)  Cyanocobalamin 1000 Mcg/ml Soln (Cyanocobalamin) .... Inject im once per month 7)  Diclofenac Sodium 75 Mg Tbec (Diclofenac sodium) .... Take 1 tablet by mouth two times a day as needed 8)  Fluticasone Propionate 50 Mcg/act Susp (Fluticasone propionate) .... 2 sprays each nostril once daily 9)  Zetia 10 Mg Tabs (Ezetimibe) .... Take 1 tablet by mouth once a day 10)  Levothroid 100 Mcg Tabs (Levothyroxine sodium) .... Take one tablet once daily 11)  Lidoderm 5 % Ptch (Lidocaine) .... Apply once daily for 12 hours a day as needed to affected areas  Patient Instructions: 1)  1.  Avoid white bread, white pasta and white rice 2)  2.  Avoid high fructose corn syrup 3)  3.  Instead eat brown carbs- Yams, Wheat pasta, whole grained breads, wild rice. 4)  Please return for a FASTING Lipid  Profile (272.4)and HgBA1c(790.29) TSH (244.9) 1 week before your f/u appointment in 3 months 5)  It is important that you exercise regularly at least 20 minutes 5 times a week. If you develop chest pain, have severe difficulty breathing, or feel very tired , stop exercising immediately and seek medical attention.

## 2010-09-24 NOTE — Letter (Signed)
   [place] at [facility] [address] [place], [place]  [address]  [place] Phone: [phone]      May 17, 2010   Sheryl [name] [address] [place], [place] [address]  RE:  LAB RESULTS  Dear  [name],  The following is an interpretation of your most recent lab tests.  Please take note of any instructions provided or changes to medications that have resulted from your lab work.     CBC:  Good - no changes needed B12 and Vitamin D levels are normal.  Continue your current medications.  No changes at this time.   Sincerely Yours,    [name] FNP  Appended Document:  mailed

## 2010-09-24 NOTE — Assessment & Plan Note (Signed)
Summary: 6 MONTH FOLLOW UP/MHFQ--Rm 4   Vital Signs:  Patient profile:   65 year old female Height:      63 inches Weight:      204.25 pounds BMI:     36.31 Temp:     98.4 degrees F oral Pulse rate:   96 / minute Pulse rhythm:   regular Resp:     18 per minute BP sitting:   126 / 84  (right arm) Cuff size:   large  Vitals Entered By: Mervin Kung CMA Duncan Dull) (May 15, 2010 1:39 PM) CC: Rm 4  Pt states she feels fatigued, body aches, fever and head congestion x 1 1/2 weeks. Is Patient Diabetic? Yes Comments Pt needs refills on all meds. Nicki Guadalajara Fergerson CMA Duncan Dull)  May 15, 2010 1:44 PM    Primary Care Provider:  [name] FNP  CC:  Rm 4  Pt states she feels fatigued, body aches, and fever and head congestion x 1 1/2 weeks.Marland Kitchen  History of Present Illness: Ms Strother is a 65 year old female who presents today with c/o fever 100.6 this AM.  She took Alka-seltzer plus cold this AM.  Notes some ear discomfort.  Feels run down, mild nasal drainage.  Asthma  has recently flared up since her trip to [place]. Mild cough- resolves after use of inhaler.  Last dose of methotrexate was on saturday. Complains of fatigue.    Diabetes-  does not check sugars at home.  Denies nocturia.  Denies polydipsia.    Allergies: 1)  ! Levaquin  Past History:  Past Medical History: Hyperlipidemia Hypothyroidism rheumatoid arthitis pernicious anemia Asthma DM 2 Tobacco abuse hyperlipidemia  Review of Systems       see HPI  Physical Exam  General:  Well-developed,well-nourished,in no acute distress; alert,appropriate and cooperative throughout examination Head:  Normocephalic and atraumatic without obvious abnormalities. No apparent alopecia or balding. Eyes:  PERRLA Ears:  External ear exam shows no significant lesions or deformities.  Otoscopic examination reveals clear canals, tympanic membranes are intact bilaterally without bulging, retraction, inflammation  or discharge. Hearing is grossly normal bilaterally. Mouth:  Oral mucosa and oropharynx without lesions or exudates.  Teeth in good repair. Neck:  No deformities, masses, or tenderness noted. Lungs:  Normal respiratory effort, chest expands symmetrically. Lungs are clear to auscultation, no crackles or wheezes. Heart:  Normal rate and regular rhythm. S1 and S2 normal without gallop, murmur, click, rub or other extra sounds.  Diabetes Management Exam:    Foot Exam (with socks and/or shoes not present):       Sensory-Monofilament:          Left foot: normal          Right foot: normal       Inspection:          Left foot: normal          Right foot: normal       Nails:          Left foot: normal          Right foot: normal   Impression & Recommendations:  Problem # 1:  URI (ICD-465.9) Assessment New Given duration of symptoms, recent fever,  and patient's immune suppressed state on Methotrexate, will treat empirically with antibiotics.  (Ceftin) Her updated medication list for this problem includes:    Diclofenac Sodium 75 Mg Tbec (Diclofenac sodium) .Marland Kitchen... Take 1 tablet by mouth two times a day as needed  Aspirin Ec 81 Mg Tbec (Aspirin) ..... One tab by mouth daily  Orders: TLB-CBC Platelet - w/Differential ([id number]) Prescription Created Electronically [phone])  Problem # 2:  DIABETES MELLITUS (ICD-250.00) Assessment: Comment Only Has been controlled, Foot exam normal.  Check a1C Orders: Hgb A1C ([id number])  Her updated medication list for this problem includes:    Aspirin Ec 81 Mg Tbec (Aspirin) ..... One tab by mouth daily  Problem # 3:  HYPOTHYROIDISM (ICD-244.9) Assessment: Unchanged Will check TSH today Her updated medication list for this problem includes:    Levothroid 100 Mcg Tabs (Levothyroxine sodium) .Marland Kitchen... Take one tablet once daily  Problem # 4:  PERNICIOUS ANEMIA (ICD-281.0) Assessment: Unchanged She is no longer receiving injections, taking by mouth,  will check B12 level now that she is back on methotrexate to make sure that this is sufficient.  The following medications were removed from the medication list:    Cyanocobalamin 1000 Mcg/ml Soln (Cyanocobalamin) ..... Inject im once per month  Orders: T-Vitamin B12 ([id number])  Problem # 5:  UNSPECIFIED VITAMIN D DEFICIENCY (ICD-268.9) Assessment: Unchanged Check Vitamin D level.  Orders: T-Assay of Vitamin D [id number])  Problem # 6:  TOBACCO ABUSE (ICD-305.1) Assessment: Unchanged  Patient was counseled on tobacco cessation   Orders: Tobacco use cessation intermediate 3-10 minutes ([id number])  Complete Medication List: 1)  Advair Diskus 100-50 Mcg/dose Aepb (Fluticasone-salmeterol) .Marland Kitchen.. 1puff by mouth two times a day 2)  Ventolin Hfa 108 (90 Base) Mcg/act Aers (Albuterol sulfate) .Marland Kitchen.. 1 puff by mouth once daily as needed 3)  Pantoprazole Sodium 40 Mg Tbec (Pantoprazole sodium) .... Take 1 tablet by mouth once a day 4)  Ambien 5 Mg Tabs (Zolpidem tartrate) .... One tablet by mouth at bedtime as needed 5)  Diclofenac Sodium 75 Mg Tbec (Diclofenac sodium) .... Take 1 tablet by mouth two times a day as needed 6)  Fluticasone Propionate 50 Mcg/act Susp (Fluticasone propionate) .... 2 sprays each nostril once daily 7)  Zetia 10 Mg Tabs (Ezetimibe) .... Take 1 tablet by mouth once a day 8)  Levothroid 100 Mcg Tabs (Levothyroxine sodium) .... Take one tablet once daily 9)  Lidoderm 5 % Ptch (Lidocaine) .... Apply once daily for 12 hours a day as needed to affected areas 10)  Patanol 0.1 % Soln (Olopatadine hcl) .... One drop in each eye twice daily 11)  Methotrexate Sodium 25 Mg/ml Soln (Methotrexate sodium) .... Inject 1 time a week. 12)  B Complex Tabs (B complex vitamins) .... One tablet by mouth daily 13)  Plaquenil 200 Mg Tabs (Hydroxychloroquine sulfate) .... Take 1 tablet by mouth two times a day 14)  Ceftin 500 Mg Tabs (Cefuroxime axetil) .... One tablet by mouth two times a  day x 10 days 15)  Vitamin D 1000 Unit Tabs (Cholecalciferol) .... 2 tabs by mouth daily 16)  Vitamin E 1000 Unit Caps (Vitamin e) .... On cap 3x weekly 17)  Fish Oil Double Strength 1200 Mg Caps (Omega-3 fatty acids) .... One cap three times a day 18)  Aspirin Ec 81 Mg Tbec (Aspirin) .... One tab by mouth daily  Patient Instructions: 1)  Call if your symptoms worsen or do not improve. 2)  Follow up in 2 weeks for a complete physical- come fasting to this appointment. Prescriptions: CEFTIN 500 MG TABS (CEFUROXIME AXETIL) one tablet by mouth two times a day x 10 days  #20 x 0   Entered and Authorized by:   [name] FNP   Signed by:  [name] FNP on 05/15/2010   Method used:   Electronically to        [facility] (retail)       [address]       [place], [place]  [address]       Ph: [phone]       Fax: [phone]   RxID:   [id number]   Current Allergies (reviewed today): ! LEVAQUIN

## 2010-09-24 NOTE — Assessment & Plan Note (Signed)
Summary: 1 MONTH FOLLOW UP/MHF--Rm 5   Vital Signs:  Patient profile:   65 year old female Height:      63 inches Weight:      203.75 pounds BMI:     36.22 O2 Sat:      95 % on Room air Temp:     98.1 degrees F oral Pulse rate:   84 / minute Pulse rhythm:   regular Resp:     18 per minute BP sitting:   122 / 84  (right arm) Cuff size:   large  Vitals Entered By: Mervin Kung CMA Duncan Dull) (July 08, 2010 10:27 AM)  O2 Flow:  Room air CC: 1 month follow up. Pt states she finds herself holding her breath when she talks. Still feels tired. Is Patient Diabetic? Yes Pain Assessment Patient in pain? no      Comments Pt states she is off of her Methotrexate and Plaquenil until her infection has cleared. Pt states Methotrexate has changed to 10 pills once a week, all other med doses and directions are correct. Nicki Guadalajara Fergerson CMA Duncan Dull)  July 08, 2010 10:36 AM    Primary Care Provider:  [name] FNP  CC:  1 month follow up. Pt states she finds herself holding her breath when she talks. Still feels tired.Marland Kitchen  History of Present Illness: patient is a 65 year old female who presents today for followup. Last month she was admitted for an acute asthma/COPD exacerbation overall she is feeling better. She is currently using her rescue inhaler several times a week. She does feel that she has yet to regain her full strength however. She notes that she does become easily winded. She tells me that she has recently quit smoking and in and is using the vapor cigarettesNotes that she is easily winded.  Feels weaker than she did before she got sick.     Pneumovax- She tells me that she has had her shot within the last few years.  Allergies: 1)  ! Levaquin 2)  ! Ceftin (Cefuroxime Axetil)  Physical Exam  General:  Well-developed,well-nourished,in no acute distress; alert,appropriate and cooperative throughout examination Lungs:  Normal respiratory effort, chest expands  symmetrically. Lungs are clear to auscultation, no crackles or wheezes. Heart:  Normal rate and regular rhythm. S1 and S2 normal without gallop, murmur, click, rub or other extra sounds. Psych:  Cognition and judgment appear intact. Alert and cooperative with normal attention span and concentration. No apparent delusions, illusions, hallucinations   Impression & Recommendations:  Problem # 1:  ASTHMA (ICD-493.90) Assessment Improved clinically improved continue Advair and p.r.n. albuterol. Her updated medication list for this problem includes:    Advair Diskus 250-50 Mcg/dose Aepb (Fluticasone-salmeterol) ..... One puff twice daily    Ventolin Hfa 108 (90 Base) Mcg/act Aers (Albuterol sulfate) .Marland Kitchen... 2 puffs every 6 hours as needed  Problem # 2:  HYPOTHYROIDISM (ICD-244.9) Assessment: Comment Only patient is due for followup TSH Will check. Her updated medication list for this problem includes:    Levothroid 100 Mcg Tabs (Levothyroxine sodium) .Marland Kitchen... Take one tablet once daily  Orders: T-TSH [phone])  Labs Reviewed: TSH: 0.969 (12/26/2009)    HgBA1c: 6.1 (12/26/2009) Chol: 182 (12/26/2009)   HDL: 32 (12/26/2009)   LDL: 112 (12/26/2009)   TG: 188 (12/26/2009)  Complete Medication List: 1)  Advair Diskus 250-50 Mcg/dose Aepb (Fluticasone-salmeterol) .... One puff twice daily 2)  Ventolin Hfa 108 (90 Base) Mcg/act Aers (Albuterol sulfate) .... 2 puffs every 6 hours as needed  3)  Pantoprazole Sodium 40 Mg Tbec (Pantoprazole sodium) .... Take 1 tablet by mouth once a day 4)  Ambien 5 Mg Tabs (Zolpidem tartrate) .... One tablet by mouth at bedtime as needed 5)  Diclofenac Sodium 75 Mg Tbec (Diclofenac sodium) .... Take 1 tablet by mouth two times a day as needed 6)  Fluticasone Propionate 50 Mcg/act Susp (Fluticasone propionate) .... 2 sprays each nostril once daily as needed 7)  Zetia 10 Mg Tabs (Ezetimibe) .... Take 1 tablet by mouth once a day 8)  Levothroid 100 Mcg Tabs  (Levothyroxine sodium) .... Take one tablet once daily 9)  Patanol 0.1 % Soln (Olopatadine hcl) .... One drop in each eye twice daily as needed 10)  Methotrexate 2.5 Mg Tabs (Methotrexate sodium) .Marland Kitchen.. 10 tablets by mouth once weekly 11)  B Complex Tabs (B complex vitamins) .... One tablet by mouth daily 12)  Plaquenil 200 Mg Tabs (Hydroxychloroquine sulfate) .... Take 1 tablet by mouth two times a day 13)  Vitamin D 1000 Unit Tabs (Cholecalciferol) .... 2 tabs by mouth daily 14)  Vitamin E 1000 Unit Caps (Vitamin e) .... On cap 3x weekly 15)  Fish Oil Double Strength 1200 Mg Caps (Omega-3 fatty acids) .... One cap three times a day 16)  Aspirin Ec 81 Mg Tbec (Aspirin) .... One tab by mouth daily 17)  Nystatin 100000 Unit/gm Crea (Nystatin) .... Apply twice daily to affected area until healed 18)  Benazepril Hcl 5 Mg Tabs (Benazepril hcl) .... One half tablet by mouth once daily  Patient Instructions: 1)  Please follow up in January. 2)  Call if you develop wheezing or if you do not continue to gain strength.   Orders Added: 1)  T-TSH [[id number]] 2)  Est. Patient Level III [09811]    Current Allergies (reviewed today): ! LEVAQUIN ! CEFTIN (CEFUROXIME AXETIL)

## 2010-09-24 NOTE — Letter (Signed)
Summary: Generic Letter  [place] at [facility]  [address]   [place], [place] [address]   Phone: [phone]  Fax: [phone]      01/02/2010    JAMIE HAFFORD [address] [address] [address] [place], [place]  [address]   Dear Ms. Rockhill,  I have been unable to contact you by phone and have some medical information to share with you. Although your B12 level was normal at the last check, Pete Schnitzer would like you to return to the lab in 6 weeks to recheck your level.  I have scheduled an appointment for you to have this rechecked on 02/07/10 in the lab on the first floor of our office. An order has been sent to the lab. Their hours are 8:30 - 5:30 Monday through Friday and they are closed for lunch each day from 12:30 to 1:30pm. If this is not a good day for you please call our office to reschedule.   Sincerely,    Vilma Prader (AAMA) Sabriel Borromeo O'Sullivan,NP

## 2010-09-24 NOTE — Progress Notes (Signed)
Summary: Orders for PPD & CXR/[facility]  Orders for PPD & CXR/[facility]   Imported By: Lanelle Bal 12/03/2009 12:37:04  _____________________________________________________________________  External Attachment:    Type:   Image     Comment:   External Document

## 2010-09-24 NOTE — Assessment & Plan Note (Signed)
Summary: 3 month follo wup/mhf--Rm 4   Vital Signs:  Patient profile:   65 year old female Height:      63 inches Weight:      205 pounds BMI:     36.45 Temp:     98.6 degrees F oral Pulse rate:   90 / minute Pulse rhythm:   regular Resp:     20 per minute BP sitting:   110 / 80  (right arm) Cuff size:   large  Vitals Entered By: Mervin Kung CMA Duncan Dull) (March 13, 2010 9:33 AM) CC: Room 4   3 month follow up.  Enlarging blood vessel on pt's chest?   Primary Care Provider:  [name] FNP  CC:  Room 4   3 month follow up.  Enlarging blood vessel on pt's chest?.  History of Present Illness: Sheryl [name] is a 65 year old female who presents today for follow up.  1) RA-  Back on methotrexate.  Notes some flare up of psoriasis on her hands.  Joint pain is improved.   2) B12 deficiency- now stable on tabs, will monitor as pt is back on methotrexate.    3) Insomnia- notes difficulty falling asleep.  Does not like the way that Remus Loffler makes her feel.    Preventive Screening-Counseling & Management  Alcohol-Tobacco     Smoking Cessation Counseling: yes  Allergies: 1)  ! Levaquin  Physical Exam  General:  Overweight white female in NAD Neck:  No deformities, masses, or tenderness noted. Lungs:  Normal respiratory effort, chest expands symmetrically. Lungs are clear to auscultation, no crackles or wheezes. Heart:  Normal rate and regular rhythm. S1 and S2 normal without gallop, murmur, click, rub or other extra sounds. Extremities:  No LE edema is noted Skin:  + approx 0.5cm hyperpigmented vascular lesion noted on her chest.   Impression & Recommendations:  Problem # 1:  RHEUMATOID ARTHRITIS (ICD-714.0) Assessment Improved Now being followed by Dr. Corliss Skains, back on methotrexate.  Stable.  Problem # 2:  INSOMNIA (ICD-780.52) Assessment: Deteriorated  Will change pt from the ambien CR 12.5 to ambien 5mg  to see if she is better able to tolerate. Her  updated medication list for this problem includes:    Ambien 5 Mg Tabs (Zolpidem tartrate) ..... One tablet by mouth at bedtime as needed  Orders: Prescription Created Electronically [phone])  Problem # 3:  PERNICIOUS ANEMIA (ICD-281.0) B12 has been stable on oral supplementation.  Hgb normal.  Monitor as pt is back on methotrexate. Her updated medication list for this problem includes:    Cyanocobalamin 1000 Mcg/ml Soln (Cyanocobalamin) ..... Inject im once per month  Hgb: 14.6 (03/06/2010)   Hct: 45.6 (03/06/2010)   Platelets: 181 (03/06/2010) RBC: 4.72 (03/06/2010)   RDW: 16.1 (03/06/2010)   WBC: 6.1 (03/06/2010) MCV: 96.6 (03/06/2010)   MCHC: 32.0 (03/06/2010) B12: 438 (03/06/2010)   TSH: 0.969 (12/26/2009)  Complete Medication List: 1)  Advair Diskus 100-50 Mcg/dose Aepb (Fluticasone-salmeterol) .Marland Kitchen.. 1puff by mouth two times a day 2)  Ventolin Hfa 108 (90 Base) Mcg/act Aers (Albuterol sulfate) .Marland Kitchen.. 1 puff by mouth once daily as needed 3)  Pantoprazole Sodium 40 Mg Tbec (Pantoprazole sodium) .... Take 1 tablet by mouth once a day 4)  Ambien 5 Mg Tabs (Zolpidem tartrate) .... One tablet by mouth at bedtime as needed 5)  Cyanocobalamin 1000 Mcg/ml Soln (Cyanocobalamin) .... Inject im once per month 6)  Diclofenac Sodium 75 Mg Tbec (Diclofenac sodium) .... Take 1 tablet by  mouth two times a day as needed 7)  Fluticasone Propionate 50 Mcg/act Susp (Fluticasone propionate) .... 2 sprays each nostril once daily 8)  Zetia 10 Mg Tabs (Ezetimibe) .... Take 1 tablet by mouth once a day 9)  Levothroid 100 Mcg Tabs (Levothyroxine sodium) .... Take one tablet once daily 10)  Lidoderm 5 % Ptch (Lidocaine) .... Apply once daily for 12 hours a day as needed to affected areas 11)  Patanol 0.1 % Soln (Olopatadine hcl) .... One drop in each eye twice daily 12)  Methotrexate Sodium 25 Mg/ml Soln (Methotrexate sodium) .... Inject 1 time a week. 13)  B Complex Tabs (B complex vitamins) .... One tablet by  mouth daily 14)  Plaquenil 200 Mg Tabs (Hydroxychloroquine sulfate) .... Take 1 tablet by mouth two times a day  Patient Instructions: 1)  You will be contacted about your referral to Dermatology. 2)  Please follow up in 3 months. 3)  Tobacco is very bad for your health and your loved ones! You Should stop smoking!. 4)  Have a nice summer. Prescriptions: AMBIEN 5 MG TABS (ZOLPIDEM TARTRATE) one tablet by mouth at bedtime as needed  #30 x 0   Entered and Authorized by:   [name] FNP   Signed by:   [name] FNP on 03/13/2010   Method used:   Print then Give to Patient   RxID:   [id number]   Current Allergies (reviewed today): ! LEVAQUIN

## 2010-09-24 NOTE — Miscellaneous (Signed)
Summary: bmp order  Clinical Lists Changes  Orders: Added new Test order of T-Basic Metabolic Panel [phone]) - Signed

## 2010-09-24 NOTE — Letter (Signed)
Summary: Tax Exclusion Form/[place] [employer]  Tax Exclusion Form/[place] [employer]   Imported By: Lanelle Bal 05/03/2010 09:56:11  _____________________________________________________________________  External Attachment:    Type:   Image     Comment:   External Document

## 2010-09-24 NOTE — Progress Notes (Signed)
Summary: cipro drops  Phone Note Call from Patient   Caller: Patient Call For: [name] FNP Summary of Call: Pt. states that her doctor used to prescribe Ciprofloxin  eye drops for her to keep on hand when her allergies flared up. Can we send a rx  to [facility]? Initial call taken by: Mervin Kung CMA,  November 16, 2009 2:00 PM  Follow-up for Phone Call        Cipro is only good for infection, for itching eyes I recommend patanol.  She should try this and call us if her symptoms do not improve.   Follow-up by: [name] FNP,  November 16, 2009 2:33 PM  Additional Follow-up for Phone Call Additional follow up Details #1::        Left message on pt's voicemail informing her of Patanol rx. instead of Cipro. Advised pt to call the office if she has any questions. Nicki Guadalajara Fergerson CMA  November 16, 2009 3:38 PM     New/Updated Medications: PATANOL 0.1 % SOLN (OLOPATADINE HCL) one drop in each eye twice daily Prescriptions: PATANOL 0.1 % SOLN (OLOPATADINE HCL) one drop in each eye twice daily  #1 x 1   Entered and Authorized by:   [name] FNP   Signed by:   [name] FNP on 11/16/2009   Method used:   Electronically to        [facility] (retail)       [address]       [place], [place]  [address]       Ph: [phone]       Fax: [phone]   RxID:   [id number]

## 2010-09-24 NOTE — Letter (Signed)
   [place] [facility] [address] [place], [place]  [address]  [place] Phone: [phone]      September 11, 2009   [facility] [address] [place], [place] [address]  RE:  LAB RESULTS  Dear  Ms. Koebel,  The following is an interpretation of your most recent lab tests.  Please take note of any instructions provided or changes to medications that have resulted from your lab work.  ELECTROLYTES:  Good - no changes needed  KIDNEY FUNCTION TESTS:  Good - no changes needed  LIVER FUNCTION TESTS:  Good - no changes needed   THYROID STUDIES:  Thyroid studies normal TSH: 1.303     DIABETIC STUDIES:  Good - no changes needed Blood Glucose: 94   HgbA1C: 6.6     CBC:  Good - no changes needed Your sed rate is elevated- please arrange an appointment with Rheumatology.  Your B12 level is good.   Sincerely Yours,    [name] FNP

## 2010-09-24 NOTE — Assessment & Plan Note (Signed)
Summary: fever x 4 days/dt   Vital Signs:  Patient profile:   65 year old female Height:      63 inches Weight:      201.25 pounds BMI:     35.78 Temp:     98.4 degrees F oral Pulse rate:   102 / minute Pulse rhythm:   regular Resp:     18 per minute BP sitting:   126 / 78  (right arm) Cuff size:   large  Vitals Entered By: Mervin Kung CMA Duncan Dull) (May 20, 2010 1:15 PM) CC: Rm 5  Chest congestion, difficulty breathing. Thinks she had a reaction to Ceftin. Is Patient Diabetic? Yes Pain Assessment Patient in pain? no        Primary Care Provider:  [name] FNP  CC:  Rm 5  Chest congestion and difficulty breathing. Thinks she had a reaction to Ceftin.Marland Kitchen  History of Present Illness: Sheryl [name] is a 65 year old female who presents today for follow up.  Last week she complained of fatigue, nasal drainge and feeling "run down."  She was started on empiric Ceftin.  She notes that after 2 doses of abx started feel good.   Then Thursday evening, she developed severe dry, hacking cough.  Now sore from coughing so hard.  Has been using Mucinex.  Now bringing up green sputum.  Had fever last night up to 102.9.  Lower in the AM. Patient took, Tylenol and Alkaseltzer plus.     Allergies: 1)  ! Levaquin 2)  ! Ceftin (Cefuroxime Axetil)  Past History:  Past Medical History: Last updated: 05/15/2010 Hyperlipidemia Hypothyroidism rheumatoid arthitis pernicious anemia Asthma DM 2 Tobacco abuse hyperlipidemia  Review of Systems       Denies dysuria or urinary frequency  Physical Exam  General:  Well-developed,well-nourished,in no acute distress; alert,appropriate and cooperative throughout examination Head:  Normocephalic and atraumatic without obvious abnormalities. No apparent alopecia or balding. Lungs:  Fair air movement to bases.  + intermittent hacking cough.  Bilateral expiratory wheeze. No increased WOB.   Heart:  Normal rate and regular rhythm.  S1 and S2 normal without gallop, murmur, click, rub or other extra sounds. Extremities:  No LE edema.     Impression & Recommendations:  Problem # 1:  BRONCHITIS (ICD-490) Assessment New 65 year old female with Acute asthma exacerbation in the setting of bronchitis.  CXR was negative for pneumonia. Was unable to tolerate ceftin.  Will switch to zithromax.  Solumedrol given in office along with albuterol neb.  Plan to follow with prednisone taper.  Pt instructed to use albuterol every 4-6 hours.  F/u in 2 days.   (f/u o2 sat after neb was 95%)  Will also check urine culture to rule out UTI given patient's recent report of fever.   Her updated medication list for this problem includes:    Advair Diskus 100-50 Mcg/dose Aepb (Fluticasone-salmeterol) .Marland Kitchen... 1puff by mouth two times a day    Ventolin Hfa 108 (90 Base) Mcg/act Aers (Albuterol sulfate) .Marland Kitchen... 2 puffs every 6 hours as needed    Ceftin 500 Mg Tabs (Cefuroxime axetil) ..... One tablet by mouth two times a day x 10 days    Zithromax 250 Mg Tabs (Azithromycin) .Marland Kitchen... 2 tabs by mouth today, then once daily for 4 more days.    Tessalon Perles 100 Mg Caps (Benzonatate) ..... One cap by mouth three times a day as needed cough  Orders: CXR- 2view (CXR) Prescription Created Electronically [phone])  Complete Medication List: 1)  Advair Diskus 100-50 Mcg/dose Aepb (Fluticasone-salmeterol) .Marland Kitchen.. 1puff by mouth two times a day 2)  Ventolin Hfa 108 (90 Base) Mcg/act Aers (Albuterol sulfate) .... 2 puffs every 6 hours as needed 3)  Pantoprazole Sodium 40 Mg Tbec (Pantoprazole sodium) .... Take 1 tablet by mouth once a day 4)  Ambien 5 Mg Tabs (Zolpidem tartrate) .... One tablet by mouth at bedtime as needed 5)  Diclofenac Sodium 75 Mg Tbec (Diclofenac sodium) .... Take 1 tablet by mouth two times a day as needed 6)  Fluticasone Propionate 50 Mcg/act Susp (Fluticasone propionate) .... 2 sprays each nostril once daily as needed 7)  Zetia 10 Mg Tabs  (Ezetimibe) .... Take 1 tablet by mouth once a day 8)  Levothroid 100 Mcg Tabs (Levothyroxine sodium) .... Take one tablet once daily 9)  Patanol 0.1 % Soln (Olopatadine hcl) .... One drop in each eye twice daily as needed 10)  Methotrexate Sodium 25 Mg/ml Soln (Methotrexate sodium) .... Inject 1 time a week. 11)  B Complex Tabs (B complex vitamins) .... One tablet by mouth daily 12)  Plaquenil 200 Mg Tabs (Hydroxychloroquine sulfate) .... Take 1 tablet by mouth two times a day 13)  Ceftin 500 Mg Tabs (Cefuroxime axetil) .... One tablet by mouth two times a day x 10 days 14)  Vitamin D 1000 Unit Tabs (Cholecalciferol) .... 2 tabs by mouth daily 15)  Vitamin E 1000 Unit Caps (Vitamin e) .... On cap 3x weekly 16)  Fish Oil Double Strength 1200 Mg Caps (Omega-3 fatty acids) .... One cap three times a day 17)  Aspirin Ec 81 Mg Tbec (Aspirin) .... One tab by mouth daily 18)  Zithromax 250 Mg Tabs (Azithromycin) .... 2 tabs by mouth today, then once daily for 4 more days. 19)  Prednisone 10 Mg Tabs (Prednisone) .... Take 4 tablets daily x 2 days, then 3 tabs daily x 2 days, then 2 tabs daily x 2 days, then 1 tab daily for 2 days then stop. 20)  Tessalon Perles 100 Mg Caps (Benzonatate) .... One cap by mouth three times a day as needed cough  Other Orders: Nebulizer Tx (44010) Albuterol Sulfate Sol 1mg  unit dose (U7253) Solumedrol up to 125mg  (G6440) Admin of Therapeutic Inj  intramuscular or subcutaneous (34742) Specimen Handling (99000) UA Dipstick w/o Micro (manual) (81002) T-Culture, Urine ([id number])  Patient Instructions: 1)  Please follow up in 2 days. 2)  Call if your symptoms worsen or do not improve. Prescriptions: TESSALON PERLES 100 MG CAPS (BENZONATATE) one cap by mouth three times a day as needed cough  #30 x 0   Entered and Authorized by:   [name] FNP   Signed by:   [name] FNP on 05/20/2010   Method used:   Electronically to        [facility] (retail)       [address]       [place], [place]  [address]       Ph: [phone]       Fax: [phone]   RxID:   [id number] PREDNISONE 10 MG TABS (PREDNISONE) take 4 tablets daily x 2 days, then 3 tabs daily x 2 days, then 2 tabs daily x 2 days, then 1 tab daily for 2 days then stop.  #20 x 0   Entered and Authorized by:   [name] FNP   Signed by:   [name] FNP  on 05/20/2010   Method used:   Electronically to        [facility] (retail)       [address]       [place], [place]  [address]       Ph: [phone]       Fax: [phone]   RxID:   [id number] ZITHROMAX 250 MG TABS (AZITHROMYCIN) 2 tabs by mouth today, then once daily for 4 more days.  #1 pack x 0   Entered and Authorized by:   [name] FNP   Signed by:   [name] FNP on 05/20/2010   Method used:   Electronically to        [facility] (retail)       [address]       [place], [place]  [address]       Ph: [phone]       Fax: [phone]   RxID:   [id number]    Medication Administration  Injection # 1:    Medication: Solumedrol up to 125mg     Diagnosis: URI (ICD-465.9)    Route: IM    Site: RUOQ gluteus    Exp Date: 04/24/2012    Lot #: [id number]    Mfr: [employer]    Patient tolerated injection without complications    Given by: Mervin Kung CMA (AAMA) (May 20, 2010 4:07 PM)  Medication # 1:    Medication: Albuterol Sulfate Sol 1mg  unit dose    Diagnosis: URI (ICD-465.9)    Dose: 2.5mg  / 3mL    Route: inhaled    Exp Date: 02/22/2011    Lot #: [id number]    Mfr: [employer]    Patient tolerated medication without complications    Given by: Mervin Kung CMA Duncan Dull) (May 20, 2010 2:23 PM)  Orders Added: 1)  CXR- 2view [CXR] 2)  Nebulizer Tx [06301] 3)  Albuterol Sulfate Sol 1mg  unit dose [J7613] 4)  Est. Patient Level III [60109] 5)  Prescription Created Electronically [G8553] 6)  Solumedrol up  to 125mg  [J2930] 7)  Admin of Therapeutic Inj  intramuscular or subcutaneous [96372] 8)  Specimen Handling [99000] 9)  UA Dipstick w/o Micro (manual) [81002] 10)  T-Culture, Urine [[id number]]   Laboratory Results   Urine Tests   Date/Time Reported: Mervin Kung CMA Duncan Dull)  May 20, 2010 5:34 PM   Routine Urinalysis   Color: amber Appearance: Clear Glucose: negative   (Normal Range: Negative) Bilirubin: negative   (Normal Range: Negative) Ketone: negative   (Normal Range: Negative) Spec. Gravity: 1.010   (Normal Range: 1.003-1.035) Blood: trace-intact   (Normal Range: Negative) pH: 7.5   (Normal Range: 5.0-8.0) Protein: 30   (Normal Range: Negative) Urobilinogen: 0.2   (Normal Range: 0-1) Nitrite: negative   (Normal Range: Negative) Leukocyte Esterace: trace   (Normal Range: Negative)    Comments: Urine cultured. Nicki Guadalajara Fergerson CMA Duncan Dull)  May 20, 2010 5:36 PM

## 2010-09-24 NOTE — Progress Notes (Signed)
----   Converted from flag ---- ---- 06/04/2010 2:10 PM, Mervin Kung CMA (AAMA) wrote: Order faxed.  ---- 06/04/2010 8:09 AM, [name] FNP wrote: could you please fax order to lab for BMET (diagnosis HTN).  I instructed pt to f/u in 1 month. thanks ------------------------------

## 2010-09-24 NOTE — Progress Notes (Signed)
Summary: Methotrexate Refill  Phone Note Refill Request Message from:  Fax from Pharmacy on September 05, 2009 12:51 PM  Refills Requested: Medication #1:  METHOTREXATE 2.5 MG TABS 10  tablets by mouth once a week   Dosage confirmed as above?Dosage Confirmed   Brand Name Necessary? No   Supply Requested: 1 month   Last Refilled: 08/01/2009  Method Requested: Electronic Next Appointment Scheduled: 09-10-09 215 Sheryl [name] Initial call taken by: Roselle Locus,  September 05, 2009 12:51 PM    Prescriptions: METHOTREXATE 2.5 MG TABS (METHOTREXATE SODIUM) 10  tablets by mouth once a week  #40 x 0   Entered and Authorized by:   [name] FNP   Signed by:   [name] FNP on 09/05/2009   Method used:   Electronically to        [facility] (retail)       [address]       [place], [place]  [address]       Ph: [phone]       Fax: [phone]   RxID:   [id number]

## 2010-09-24 NOTE — Progress Notes (Signed)
Summary: certification of disability papers, fever  Phone Note Outgoing Call Call back at [phone]   Call placed by: Mervin Kung, CMA (AAMA) Call placed to: Patient Summary of Call: Left message for pt to call and let me know if she wants Certification of Disability papers mailed to her home or does she want to pick them up? Nicki Guadalajara Fergerson CMA Duncan Dull)  June 04, 2010 4:58 PM   Follow-up for Phone Call        Spoke to pt and she would like paper mailed to her home.  Pt also stated that she was running a fever around 100 a couple of days. She started taking a probitotic and states she feels better and is not having the fever now. Pt will call for appt  if fever returns. Nicki Guadalajara Fergerson CMA Duncan Dull)  June 07, 2010 11:08 AM

## 2010-09-24 NOTE — Miscellaneous (Signed)
  Clinical Lists Changes  Observations: Added new observation of MAMMOGRAM: abnormal bilateral (11/16/2009 16:36)      Preventive Care Screening  Mammogram:    Date:  11/16/2009    Results:  abnormal bilateral

## 2010-09-24 NOTE — Progress Notes (Signed)
Summary: pt. question  Phone Note Call from Patient   Summary of Call: pt. called wanting to know if you got Dr.Deveshwar's orders on this pt. for a cxr and tb test.? Call back patient at [phone] She wants to get these completed TODAY Initial call taken by: Michaelle Copas,  November 15, 2009 12:24 PM  Follow-up for Phone Call        Spoke to pt and made nurse visit for 11/16/09 @ 1:30pm at [facility] office. Mervin Kung CMA  November 15, 2009 12:53 PM

## 2010-09-24 NOTE — Progress Notes (Signed)
Summary: refill request  Phone Note Refill Request Message from:  Fax from Pharmacy on September 03, 2009 10:43 AM  Refills Requested: Medication #1:  LEVOTHROID 100 MCG TABS Take one tablet once daily   Dosage confirmed as above?Dosage Confirmed   Brand Name Necessary? No   Supply Requested: 1 month   Last Refilled: 08/03/2009  Method Requested: Electronic Next Appointment Scheduled: none Initial call taken by: Roselle Locus,  September 03, 2009 10:43 AM  Follow-up for Phone Call        Please call Sheryl [name] and let her know that I have sent a 2 week supply of her thyroid medicine to her pharmacy, but that I would like to see her back in the office for follow up before I authorize any further refills.  Thanks Follow-up by: [name] FNP,  September 03, 2009 4:03 PM  Additional Follow-up for Phone Call Additional follow up Details #1::        left detailed msg on machine informing patient of recommendations...........Marland KitchenDoristine Devoid  September 03, 2009 4:34 PM     Prescriptions: LEVOTHROID 100 MCG TABS (LEVOTHYROXINE SODIUM) Take one tablet once daily  #15 x 0   Entered and Authorized by:   [name] FNP   Signed by:   [name] FNP on 09/03/2009   Method used:   Electronically to        [facility] (retail)       [address]       [place], [place]  [address]       Ph: [phone]       Fax: [phone]   RxID:   [id number]

## 2010-09-24 NOTE — Progress Notes (Signed)
Summary: refill--Advair  Phone Note Refill Request Message from:  Fax from [facility] in [place] on March 25, 2010 2:06 PM  Refills Requested: Medication #1:  ADVAIR DISKUS 100-50 MCG/DOSE AEPB 1puff by mouth two times a day   Dosage confirmed as above?Dosage Confirmed   Supply Requested: 1 month   Last Refilled: 02/10/2010 Next Appointment Scheduled: 05-15-10  Peggyann Juba, NP Initial call taken by: Mervin Kung CMA (AAMA),  March 25, 2010 2:06 PM    Prescriptions: ADVAIR DISKUS 100-50 MCG/DOSE AEPB (FLUTICASONE-SALMETEROL) 1puff by mouth two times a day  #60 Each x 2   Entered by:   Mervin Kung CMA (AAMA)   Authorized by:   [name] FNP   Signed by:   Mervin Kung CMA (AAMA) on 03/25/2010   Method used:   Electronically to        [facility] (retail)       [address]       [place], [place]  [address]       Ph: [phone]       Fax: [phone]   RxID:   [id number]

## 2010-09-24 NOTE — Assessment & Plan Note (Signed)
Summary: FLU SHOT/DT  Nurse Visit  CC: Pt here for flu vaccine. Flu Vaccine Consent Questions     Do you have a history of severe allergic reactions to this vaccine? no    Any prior history of allergic reactions to egg and/or gelatin? no    Do you have a sensitivity to the preservative Thimersol? no    Do you have a past history of Guillan-Barre Syndrome? no    Do you currently have an acute febrile illness? no    Have you ever had a severe reaction to latex? no    Vaccine information given and explained to patient? yes    Are you currently pregnant? no    Lot Number:[id number]   Exp Date:02/22/2011   Site Given Right Deltoid IM   Allergies: 1)  ! Levaquin  Orders Added: 1)  Flu Vaccine 55yrs + [90658] 2)  Administration Flu vaccine - MCR [G0008]

## 2010-09-24 NOTE — Progress Notes (Signed)
Summary: refill --pantoprazole  Phone Note Refill Request Message from:  Fax from Pharmacy on May 06, 2010 9:56 AM  Refills Requested: Medication #1:  PANTOPRAZOLE SODIUM 40 MG TBEC Take 1 tablet by mouth once a day   Dosage confirmed as above?Dosage Confirmed   Brand Name Necessary? No   Supply Requested: 1 month   Last Refilled: 04/11/2010  Method Requested: Electronic Next Appointment Scheduled: 05-15-10 [address] [place]  Initial call taken by: Roselle Locus,  May 06, 2010 9:57 AM    Prescriptions: PANTOPRAZOLE SODIUM 40 MG TBEC (PANTOPRAZOLE SODIUM) Take 1 tablet by mouth once a day  #30 Tablet x 1   Entered by:   Mervin Kung CMA (AAMA)   Authorized by:   [name] FNP   Signed by:   Mervin Kung CMA (AAMA) on 05/06/2010   Method used:   Electronically to        [facility] (retail)       [address]       [place], [place]  [address]       Ph: [phone]       Fax: [phone]   RxID:   [id number]

## 2010-09-24 NOTE — Assessment & Plan Note (Signed)
Summary: 3 MONTH FOLLOW UP/MHF   Vital Signs:  Patient profile:   65 year old female Height:      63 inches Weight:      208.25 pounds BMI:     37.02 Temp:     98.0 degrees F oral Pulse rate:   90 / minute Pulse rhythm:   r128 Resp:     18 per minute BP sitting:   128 / 88  (right arm) Cuff size:   large  Vitals Entered By: Mervin Kung CMA (Dec 26, 2009 1:34 PM) CC: ROOM 4  3 month follow up.  Pt states she has started Methotrexate and in the past this causes redness at the injection site and has gotten infected. Is Patient Diabetic? No Comments Pt did not have fasting lipid pnl, a1c or tsh done. She is fasting and can complete them today.   Primary Care Provider:  [name] FNP  CC:  ROOM 4  3 month follow up.  Pt states she has started Methotrexate and in the past this causes redness at the injection site and has gotten infected.Marland Kitchen  History of Present Illness: Ms Sheryl [name] is a 65 year old female who presents today for follow up.  She reports that she has not eaten in 8 hours.     1) RA- saw Dr. Corliss Skains- started injectable methotrexate.  Joint pain is stable- does have some pain in left toes and hands- worse with gardening.    2)Tobacco abuse- has cut down to 4 cigarettes a day.  3)HTN- Reports that she has been monitoring BP at home and BP has been stable.  Keeps a low sodium diet.  4) Hyperglycemia- last A1C was 6.1- pt reports that she has been eating   5) Asthma- Was bothering her more when pollen counts were high, continues on Advair. Symptoms stable.    Allergies: 1)  ! Levaquin  Physical Exam  General:  Overweight white female in NAD Lungs:  Normal respiratory effort, chest expands symmetrically. Lungs are clear to auscultation, no crackles or wheezes. Heart:  Normal rate and regular rhythm. S1 and S2 normal without gallop, murmur, click, rub or other extra sounds. Msk:  mild joint enlargement noted in bilateral fingers. Extremities:  no  swelling.    Skin:  small pea sized nodule at methotrexate injection site left thigh without surrounding erythema.  No drainage noted.   Impression & Recommendations:  Problem # 1:  DIABETES MELLITUS (ICD-250.00) Assessment Unchanged  Will check A1C today.  Pt reports that she has been working hard on diet.  Orders: T-Hgb A1C ([address])   Labs Reviewed: Creat: 0.74 (09/10/2009)    Reviewed HgBA1c results: 6.6 (09/10/2009)  Problem # 2:  PERNICIOUS ANEMIA (ICD-281.0) Assessment: Comment Only Patient reports that she takes an oral B12 supplement.  Initial B12 level in October was in the 400's- this was following a 2 month hiatus from injections.  Her last injection was in February.  Will check level and determine ongoing need for injections vs oral supplement. Her updated medication list for this problem includes:    Cyanocobalamin 1000 Mcg/ml Soln (Cyanocobalamin) ..... Inject im once per month  Orders: T-Vitamin B12 ([id number])  Problem # 3:  RHEUMATOID ARTHRITIS (ICD-714.0) Assessment: Improved Symptoms are improved.  Patient recently established with Dr. Corliss Skains- defer management to Rheumatology.  Problem # 4:  ELEVATED BLOOD PRESSURE WITHOUT DIAGNOSIS OF HYPERTENSION (ICD-796.2) Assessment: Deteriorated BP is reasonable, will continue to monitor- pt instructed to continue low sodium  diet.  Orders: T-Comprehensive Metabolic Panel ([id number])  BP today: 128/88 Prior BP: 100/76 (11/16/2009)  Labs Reviewed: Creat: 0.74 (09/10/2009) Chol: 168 (06/05/2009)   HDL: 35 (06/05/2009)   LDL: 96 (06/05/2009)   TG: 184 (06/05/2009)  Instructed in low sodium diet (DASH Handout) and behavior modification.    Problem # 5:  HYPOTHYROIDISM (ICD-244.9) Check TSH Her updated medication list for this problem includes:    Levothroid 100 Mcg Tabs (Levothyroxine sodium) .Marland Kitchen... Take one tablet once daily  Orders: T-TSH [phone])  Labs Reviewed: TSH: 1.303 (09/10/2009)      HgBA1c: 6.6 (09/10/2009) Chol: 168 (06/05/2009)   HDL: 35 (06/05/2009)   LDL: 96 (06/05/2009)   TG: 184 (06/05/2009)  Problem # 6:  ASTHMA (ICD-493.90) Assessment: Improved Symptoms improved.  Continue Advair and as needed albuterol.   Her updated medication list for this problem includes:    Advair Diskus 100-50 Mcg/dose Aepb (Fluticasone-salmeterol) .Marland Kitchen... 1puff by mouth two times a day    Ventolin Hfa 108 (90 Base) Mcg/act Aers (Albuterol sulfate) .Marland Kitchen... 1 puff by mouth once daily as needed  Complete Medication List: 1)  Advair Diskus 100-50 Mcg/dose Aepb (Fluticasone-salmeterol) .Marland Kitchen.. 1puff by mouth two times a day 2)  Ventolin Hfa 108 (90 Base) Mcg/act Aers (Albuterol sulfate) .Marland Kitchen.. 1 puff by mouth once daily as needed 3)  Pantoprazole Sodium 40 Mg Tbec (Pantoprazole sodium) .... Take 1 tablet by mouth once a day 4)  Ambien Cr 12.5 Mg Cr-tabs (Zolpidem tartrate) .... Take 1 tab by mouth at bedtime 5)  Cyanocobalamin 1000 Mcg/ml Soln (Cyanocobalamin) .... Inject im once per month 6)  Diclofenac Sodium 75 Mg Tbec (Diclofenac sodium) .... Take 1 tablet by mouth two times a day as needed 7)  Fluticasone Propionate 50 Mcg/act Susp (Fluticasone propionate) .... 2 sprays each nostril once daily 8)  Zetia 10 Mg Tabs (Ezetimibe) .... Take 1 tablet by mouth once a day 9)  Levothroid 100 Mcg Tabs (Levothyroxine sodium) .... Take one tablet once daily 10)  Lidoderm 5 % Ptch (Lidocaine) .... Apply once daily for 12 hours a day as needed to affected areas 11)  Patanol 0.1 % Soln (Olopatadine hcl) .... One drop in each eye twice daily 12)  Methotrexate Sodium 25 Mg/ml Soln (Methotrexate sodium) .... Inject 1 time a week. 13)  B Complex Tabs (B complex vitamins) .... One tablet by mouth daily  Other Orders: T-Lipid Profile ([id number])  Patient Instructions: 1)  Please follow up in 3 months. 2)  Keep working hard on diabetic diet, exercise, and low sodium.  Current Allergies (reviewed today): !  LEVAQUIN

## 2010-09-24 NOTE — Assessment & Plan Note (Signed)
Summary: hospital follow up/mhf--Rm 5   Vital Signs:  Patient profile:   65 year old female Height:      63 inches Weight:      203.50 pounds BMI:     36.18 Temp:     98.2 degrees F oral Pulse rate:   90 / minute Pulse rhythm:   regular Resp:     18 per minute BP sitting:   122 / 80  (right arm) Cuff size:   large  Vitals Entered By: Mervin Kung CMA Duncan Dull) (June 03, 2010 10:44 AM) CC: Rm 5  Hospital f/u. Still has burning with urination. Also having sinus drainage and cough since Saturday. Is Patient Diabetic? Yes Pain Assessment Patient in pain? no      Comments Pt states she completed Zithromax, prednisone. Methotrexate has change to pills; now takes 10pills once a week. Methotrexate and Plaquenil are hold until infections clear up.  Nicki Guadalajara Fergerson CMA Duncan Dull)  June 03, 2010 10:53 AM    Primary Care Provider:  [name] FNP  CC:  Rm 5  Hospital f/u. Still has burning with urination. Also having sinus drainage and cough since Saturday.Marland Kitchen  History of Present Illness: Sheryl [name] is a 65 year old female who presents today for hospital follow up today from her most recent hospitalization for acute asthma exacerbation.    1.Asthma- has not need rescue inhalers all week.  Notes some occasional reflux congestion   2.Notes rash in buttocks- has been using aveeno eczema.  3.Had Enterococcus UTI in hospital-  macrobid sensitive. Notes that perineal area is irritated, denies  4. RA-  notes that she is off of her   5. Diabetes- last eye exam was greater than 1 year ago.    Allergies: 1)  ! Levaquin 2)  ! Ceftin (Cefuroxime Axetil)  Past History:  Past Medical History: Last updated: 05/15/2010 Hyperlipidemia Hypothyroidism rheumatoid arthitis pernicious anemia Asthma DM 2 Tobacco abuse hyperlipidemia  Past Surgical History: Last updated: 06/05/2009 2006 Left Knee Arthrocopic surgery 2001 Right Knee GYN surgery D& C 1975  Physical  Exam  General:  Well-developed,well-nourished,in no acute distress; alert,appropriate and cooperative throughout examination Head:  Normocephalic and atraumatic without obvious abnormalities. No apparent alopecia or balding. Neck:  No deformities, masses, or tenderness noted. Lungs:  Normal respiratory effort, chest expands symmetrically. Lungs are clear to auscultation, no crackles or wheezes. Heart:  Normal rate and regular rhythm. S1 and S2 normal without gallop, murmur, click, rub or other extra sounds. Extremities:  No clubbing, cyanosis, edema, or deformity noted with normal full range of motion of all joints.   Skin:  + erythematous rash noted on vulva and inner thighs   Impression & Recommendations:  Problem # 1:  ASTHMA, WITH ACUTE EXACERBATION (ICD-493.92) Assessment Improved Patient is now back to baseline.  She is maintained on Advair, rarely needing her rescue inhaler at this point.   The following medications were removed from the medication list:    Prednisone 10 Mg Tabs (Prednisone) .Marland Kitchen... Take 4 tablets daily x 2 days, then 3 tabs daily x 2 days, then 2 tabs daily x 2 days, then 1 tab daily for 2 days then stop. Her updated medication list for this problem includes:    Advair Diskus 250-50 Mcg/dose Aepb (Fluticasone-salmeterol) ..... One puff twice daily    Ventolin Hfa 108 (90 Base) Mcg/act Aers (Albuterol sulfate) .Marland Kitchen... 2 puffs every 6 hours as needed  Problem # 2:  DIABETES MELLITUS (ICD-250.00) Assessment: Comment Only  Patient's A1C was 6.3 on 9/27 during her hospitalization.  Will continue to monitor.  Add low dose ace for renal protection, continue aspirin.  Patient to schedule eye exam.  Flu shot up to date.   Her updated medication list for this problem includes:    Aspirin Ec 81 Mg Tbec (Aspirin) ..... One tab by mouth daily    Benazepril Hcl 5 Mg Tabs (Benazepril hcl) ..... One half tablet by mouth once daily  Labs Reviewed: Creat: 0.90 (03/11/2010)     Reviewed HgBA1c results: 6.1 (12/26/2009)  6.6 (09/10/2009)  Problem # 3:  INTERTRIGO, CANDIDAL (ICD-695.89) Assessment: New  Will treat with nystatin ointment  Orders: Prescription Created Electronically [phone])  Complete Medication List: 1)  Advair Diskus 250-50 Mcg/dose Aepb (Fluticasone-salmeterol) .... One puff twice daily 2)  Ventolin Hfa 108 (90 Base) Mcg/act Aers (Albuterol sulfate) .... 2 puffs every 6 hours as needed 3)  Pantoprazole Sodium 40 Mg Tbec (Pantoprazole sodium) .... Take 1 tablet by mouth once a day 4)  Ambien 5 Mg Tabs (Zolpidem tartrate) .... One tablet by mouth at bedtime as needed 5)  Diclofenac Sodium 75 Mg Tbec (Diclofenac sodium) .... Take 1 tablet by mouth two times a day as needed 6)  Fluticasone Propionate 50 Mcg/act Susp (Fluticasone propionate) .... 2 sprays each nostril once daily as needed 7)  Zetia 10 Mg Tabs (Ezetimibe) .... Take 1 tablet by mouth once a day 8)  Levothroid 100 Mcg Tabs (Levothyroxine sodium) .... Take one tablet once daily 9)  Patanol 0.1 % Soln (Olopatadine hcl) .... One drop in each eye twice daily as needed 10)  Methotrexate Sodium 25 Mg/ml Soln (Methotrexate sodium) .... Inject 1 time a week. 11)  B Complex Tabs (B complex vitamins) .... One tablet by mouth daily 12)  Plaquenil 200 Mg Tabs (Hydroxychloroquine sulfate) .... Take 1 tablet by mouth two times a day 13)  Vitamin D 1000 Unit Tabs (Cholecalciferol) .... 2 tabs by mouth daily 14)  Vitamin E 1000 Unit Caps (Vitamin e) .... On cap 3x weekly 15)  Fish Oil Double Strength 1200 Mg Caps (Omega-3 fatty acids) .... One cap three times a day 16)  Aspirin Ec 81 Mg Tbec (Aspirin) .... One tab by mouth daily 17)  Nystatin 100000 Unit/gm Crea (Nystatin) .... Apply twice daily to affected area until healed 18)  Benazepril Hcl 5 Mg Tabs (Benazepril hcl) .... One half tablet by mouth once daily   Patient Instructions: 1)  Please schedule an eye exam. 2)  Please follow up in 1  month for blood work.   3)  Follow up in 3 months. Prescriptions: BENAZEPRIL HCL 5 MG TABS (BENAZEPRIL HCL) one half tablet by mouth once daily  #30 x 2   Entered and Authorized by:   [name] FNP   Signed by:   [name] FNP on 06/03/2010   Method used:   Electronically to        [facility] (retail)       [address]       [place], [place]  [address]       Ph: [phone]       Fax: [phone]   RxID:   [id number] NYSTATIN 100000 UNIT/GM CREA (NYSTATIN) apply twice daily to affected area until healed  #1 tube x 1   Entered and Authorized by:   [name] FNP   Signed by:   [name] FNP on 06/03/2010  Method used:   Electronically to        [facility] (retail)       [address]       [place], [place]  [address]       Ph: [phone]       Fax: [phone]   RxID:   [id number]   Current Allergies (reviewed today): ! LEVAQUIN ! CEFTIN (CEFUROXIME AXETIL)

## 2010-09-24 NOTE — Letter (Signed)
   [place] at [facility] [address] [place], [place]  [address]  [place] Phone: [phone]      Dec 27, 2009   Sheryl [name] [address] [place], [place] [address]  RE:  LAB RESULTS  Dear  [name],  The following is an interpretation of your most recent lab tests.  Please take note of any instructions provided or changes to medications that have resulted from your lab work.  ELECTROLYTES:  Good - no changes needed  KIDNEY FUNCTION TESTS:  Good - no changes needed  LIPID PANEL:  Stable - no changes needed Triglyceride: 188   Cholesterol: 182   LDL: 112   HDL: 32   Chol/HDL%:  5.7 Ratio  THYROID STUDIES:  Thyroid studies normal TSH: 0.969     DIABETIC STUDIES:  Improved - continue management Blood Glucose: 88   HgbA1C: 6.1    Your B12 level is normal.  Please continue your B complex vitamin.  I would like for you to return for a lab draw in 6 weeks so that we can make sure that your level is stable.  If it drops then we will need to resume your injections.  Your diabetes has improved considerably.  A1C is down from 6.6 to 6.1.  Keep up the good work!   Sincerely Yours,    [name] FNP

## 2010-09-26 NOTE — Assessment & Plan Note (Signed)
Summary: 2 month f/u / tf,cma--rm 5   Vital Signs:  Patient profile:   65 year old female Height:      63 inches Weight:      205.50 pounds BMI:     36.53 Temp:     98.1 degrees F oral Pulse rate:   84 / minute Pulse rhythm:   regular Resp:     18 per minute BP sitting:   134 / 78  (right arm) Cuff size:   large  Vitals Entered By: Mervin Kung CMA Duncan Dull) (September 17, 2010 2:06 PM) CC: Pt here for 2 month follow up. Thinks that her asthma is getting worse, having to use inhaler more frequently. Is Patient Diabetic? Yes Pain Assessment Patient in pain? no        Primary Care Provider:  [name] FNP  CC:  Pt here for 2 month follow up. Thinks that her asthma is getting worse and having to use inhaler more frequently.Marland Kitchen  History of Present Illness: Patient is a 65 year old female who presents today for follow up.  1) Asthma- she is using rescue inhaler twice a day during the rain, not using at all during the dry weather.  She continues advair 250/50 twice daily.  2)RA- back on methotrexate, reports that she recently had an appointment with rheumatology  3) Pernicious anemia- continues to take oral B vitamin.  4)Tobacco abuse- quit smoking-using the vapor cigarettes  Allergies: 1)  ! Levaquin 2)  ! Ceftin (Cefuroxime Axetil)  Past History:  Past Medical History: Last updated: 05/15/2010 Hyperlipidemia Hypothyroidism rheumatoid arthitis pernicious anemia Asthma DM 2 Tobacco abuse hyperlipidemia  Family History: Reviewed history from 06/05/2009 and no changes required. Family History of CAD Female 1st degree relative <60 Family History of CAD Female 1st degree relative <50 Family History Diabetes 1st degree relative Family History High cholesterol Family History of Stroke F 1st degree relative <60  Social History: Reviewed history from 06/05/2009 and no changes required. Occupation: Unemployed Single 1 son age 49   Review of Systems     see HPI  Physical Exam  General:  Well-developed,well-nourished,in no acute distress; alert,appropriate and cooperative throughout examination Lungs:  Normal respiratory effort, chest expands symmetrically. Lungs are clear to auscultation, no crackles or wheezes. Heart:  Normal rate and regular rhythm. S1 and S2 normal without gallop, murmur, click, rub or other extra sounds. Psych:  Cognition and judgment appear intact. Alert and cooperative with normal attention span and concentration. No apparent delusions, illusions, hallucinations  Diabetes Management Exam:    Foot Exam (with socks and/or shoes not present):       Sensory-Monofilament:          Left foot: normal          Right foot: normal       Inspection:          Left foot: normal          Right foot: normal       Nails:          Left foot: normal          Right foot: normal   Impression & Recommendations:  Problem # 1:  ASTHMA (ICD-493.90) Assessment Deteriorated Will add singulair to her regimen. Her updated medication list for this problem includes:    Advair Diskus 250-50 Mcg/dose Aepb (Fluticasone-salmeterol) ..... One puff twice daily    Ventolin Hfa 108 (90 Base) Mcg/act Aers (Albuterol sulfate) .Marland Kitchen... 2 puffs every 6 hours  as needed    Singulair 10 Mg Tabs (Montelukast sodium) ..... One tablet by mouth once daily  Problem # 2:  DIABETES MELLITUS (ICD-250.00) Assessment: Comment Only Will plan to check labs as listed below.   Her updated medication list for this problem includes:    Aspirin Ec 81 Mg Tbec (Aspirin) ..... One tab by mouth daily    Benazepril Hcl 5 Mg Tabs (Benazepril hcl) ..... One half tablet by mouth once daily  Orders: Ophthalmology Referral (Ophthalmology) T-Hgb A1C [facility]) (83036-HBA1C) TLB-Microalbumin/Creat Ratio, Urine (82043-MALB)  Complete Medication List: 1)  Advair Diskus 250-50 Mcg/dose Aepb (Fluticasone-salmeterol) .... One puff twice daily 2)  Ventolin Hfa 108 (90 Base)  Mcg/act Aers (Albuterol sulfate) .... 2 puffs every 6 hours as needed 3)  Pantoprazole Sodium 40 Mg Tbec (Pantoprazole sodium) .... Take 1 tablet by mouth once a day 4)  Ambien 5 Mg Tabs (Zolpidem tartrate) .... One tablet by mouth at bedtime as needed 5)  Diclofenac Sodium 75 Mg Tbec (Diclofenac sodium) .... Take 1 tablet by mouth two times a day as needed 6)  Fluticasone Propionate 50 Mcg/act Susp (Fluticasone propionate) .... 2 sprays each nostril once daily as needed 7)  Zetia 10 Mg Tabs (Ezetimibe) .... Take 1 tablet by mouth once a day 8)  Levothroid 100 Mcg Tabs (Levothyroxine sodium) .... Take one tablet once daily 9)  Patanol 0.1 % Soln (Olopatadine hcl) .... One drop in each eye twice daily as needed 10)  Methotrexate 2.5 Mg Tabs (Methotrexate sodium) .Marland Kitchen.. 10 tablets by mouth once weekly 11)  B Complex Tabs (B complex vitamins) .... One tablet by mouth daily 12)  Plaquenil 200 Mg Tabs (Hydroxychloroquine sulfate) .... Take 1 tablet by mouth two times a day 13)  Vitamin D 1000 Unit Tabs (Cholecalciferol) .... 2 tabs by mouth daily 14)  Vitamin E 1000 Unit Caps (Vitamin e) .... On cap 3x weekly 15)  Fish Oil Double Strength 1200 Mg Caps (Omega-3 fatty acids) .... One cap three times a day 16)  Aspirin Ec 81 Mg Tbec (Aspirin) .... One tab by mouth daily 17)  Nystatin 100000 Unit/gm Crea (Nystatin) .... Apply twice daily to affected area until healed 18)  Benazepril Hcl 5 Mg Tabs (Benazepril hcl) .... One half tablet by mouth once daily 19)  Singulair 10 Mg Tabs (Montelukast sodium) .... One tablet by mouth once daily  Other Orders: Dermatology Referral (Derma) TLB-BMP (Basic Metabolic Panel-BMET) (80048-METABOL)  Patient Instructions: 1)  Please complete your lab work today on the first floor. 2)  Follow up in 3 months. Prescriptions: SINGULAIR 10 MG TABS (MONTELUKAST SODIUM) one tablet by mouth once daily  #30 x 3   Entered and Authorized by:   [name] FNP   Signed  by:   [name] FNP on 09/17/2010   Method used:   Electronically to        [facility] (retail)       [address]       [place], [place]  [address]       Ph: [phone]       Fax: [phone]   RxID:   [id number]    Orders Added: 1)  Dermatology Referral [Derma] 2)  Ophthalmology Referral [Ophthalmology] 3)  T-Hgb A1C [facility]) [83036-HBA1C] 4)  TLB-BMP (Basic Metabolic Panel-BMET) [80048-METABOL] 5)  TLB-Microalbumin/Creat Ratio, Urine [82043-MALB] 6)  Est. Patient Level III [00867]     Current Allergies (reviewed today): ! LEVAQUIN !  CEFTIN (CEFUROXIME AXETIL)

## 2010-09-26 NOTE — Letter (Signed)
   [place] at [facility] [address] [place], [place]  [address]  [place] Phone: [phone]      September 18, 2010   Sheryl [name] [address] [place], [place] [address]  RE:  LAB RESULTS  Dear  [name],  The following is an interpretation of your most recent lab tests.  Please take note of any instructions provided or changes to medications that have resulted from your lab work.  ELECTROLYTES:  Good - no changes needed  KIDNEY FUNCTION TESTS:  Good - no changes needed    DIABETIC STUDIES:  Good - no changes needed Blood Glucose: 99   HgbA1C: 6.2   Microalbumin/Creatinine Ratio: 5.7     CBC:  Good - no changes needed   Sincerely Yours,    [name] FNP  Appended Document:  Mailed

## 2010-09-26 NOTE — Progress Notes (Signed)
----   Converted from flag ---- ---- 09/19/2010 8:36 AM, Mervin Kung CMA (AAMA) wrote: Lab results faxed.  ---- 09/17/2010 2:44 PM, [name] FNP wrote: Could you please fax Dr. Corliss Skains Ms. Reine's lab work that she is going to complete today. thanks ------------------------------

## 2010-10-01 ENCOUNTER — Ambulatory Visit (INDEPENDENT_AMBULATORY_CARE_PROVIDER_SITE_OTHER): Payer: Medicare Other

## 2010-10-01 ENCOUNTER — Encounter: Payer: Self-pay | Admitting: Family

## 2010-10-01 DIAGNOSIS — Z23 Encounter for immunization: Secondary | ICD-10-CM

## 2010-10-02 NOTE — Progress Notes (Signed)
  Phone Note Outgoing Call   Call placed by: [name] FNP,  September 17, 2010 3:46 PM Call placed to: Specialist Summary of Call: Spoke with Dr. Fatima Sanger nurse re: safety of pneumovax vaccine on Methotrexate.  She told me that it is safe from their stand point- ok for the patient to receive pneumovax. Initial call taken by: [name] FNP,  September 17, 2010 3:48 PM

## 2010-10-02 NOTE — Progress Notes (Signed)
Summary: refill--ambien  Phone Note Refill Request Message from:  Fax from Pharmacy on September 24, 2010 8:31 AM  Refills Requested: Medication #1:  zolpidem tartrate 10 mg tab 1 by mouth at bedtime   Dosage confirmed as above?Dosage Confirmed   Brand Name Necessary? No   Supply Requested: 1 month   Last Refilled: 04/04/2009 [facility] [address] [place] fax [phone]   Method Requested: Electronic Next Appointment Scheduled: 10-01-10 inj Initial call taken by: Roselle Locus,  September 24, 2010 8:33 AM  Follow-up for Phone Call        Refill left on pharmacy voicemail. Nicki Guadalajara Fergerson CMA Duncan Dull)  September 24, 2010 8:37 AM     Prescriptions: AMBIEN 5 MG TABS (ZOLPIDEM TARTRATE) one tablet by mouth at bedtime as needed  #30 x 0   Entered by:   Mervin Kung CMA (AAMA)   Authorized by:   [name] FNP   Signed by:   Mervin Kung CMA (AAMA) on 09/24/2010   Method used:   Telephoned to ...       [facility]  [address] (retail)       [address]       [place], [place]  [address]       Ph: [phone]       Fax: [phone]   RxID:   [id number]

## 2010-10-09 ENCOUNTER — Encounter: Payer: Self-pay | Admitting: Family

## 2010-10-10 NOTE — Assessment & Plan Note (Signed)
Summary: pneumovax injection  Nurse Visit   Allergies: 1)  ! Levaquin 2)  ! Ceftin (Cefuroxime Axetil)  Immunizations Administered:  Pneumonia Vaccine:    Vaccine Type: Pneumovax    Site: left deltoid    Mfr: Merck    Dose: 0.5 ml    Route: IM    Given by: Mervin Kung CMA (AAMA)    Exp. Date: 01/17/2012    Lot #: [id number]    VIS given: 07/30/09 version given October 01, 2010.  Orders Added: 1)  Pneumococcal Vaccine [90732] 2)  Admin 1st Vaccine [10626]

## 2010-10-10 NOTE — Letter (Signed)
Summary: [facility]  [facility]   Imported By: Lanelle Bal 10/04/2010 11:20:18  _____________________________________________________________________  External Attachment:    Type:   Image     Comment:   External Document

## 2010-10-10 NOTE — Progress Notes (Signed)
Summary: pneumovax, organ donation  Phone Note Outgoing Call   Summary of Call: Please call patient and schedule a nurse visit for a pneumovax.  I cleared it with Dr. Reginia Forts office.   Initial call taken by: [name] FNP,  September 23, 2010 3:09 PM  Follow-up for Phone Call        Pt notified and scheduled nurse visit for 10/01/10 @ 10:30.  Pt is wanting to know when she comes for injection if we could give her some information on who to contact about becoming and organ donor. Please advise. Nicki Guadalajara Fergerson CMA [facility])  September 23, 2010 3:58 PM   Additional Follow-up for Phone Call Additional follow up Details #1::        I believe that she can indicate with the Department of motor vehicles on her license if she wishes to become an organ donor.    Additional Follow-up by: [name] FNP,  September 23, 2010 4:03 PM    Additional Follow-up for Phone Call Additional follow up Details #2::    Pt can also register online at [email or web address]. Nicki Guadalajara Fergerson CMA [facility])  September 24, 2010 2:55 PM  Pt returned for pneumovax. Pt states she is already an organ donor but wants to know how to donate her body to science.  Spoke with Efraim Kaufmann, will provide pt with [employer] ([employer]) # [phone] to see if they can assist pt. Left message on machine to return my call. Nicki Guadalajara Fergerson CMA [facility])  October 01, 2010 11:02 AM    Additional Follow-up for Phone Call Additional follow up Details #3:: Details for Additional Follow-up Action Taken: Pt notified and provided contact number.  Additional Follow-up by: Mervin Kung CMA [facility]),  October 02, 2010 2:04 PM

## 2010-10-17 ENCOUNTER — Ambulatory Visit: Payer: Self-pay | Admitting: Internal Medicine

## 2010-10-18 ENCOUNTER — Ambulatory Visit (INDEPENDENT_AMBULATORY_CARE_PROVIDER_SITE_OTHER): Payer: Medicare Other | Admitting: Family

## 2010-10-18 ENCOUNTER — Encounter: Payer: Self-pay | Admitting: Family

## 2010-10-18 DIAGNOSIS — J45909 Unspecified asthma, uncomplicated: Secondary | ICD-10-CM

## 2010-10-22 NOTE — Assessment & Plan Note (Signed)
Summary: 1 month follow up/mhf--rm 4   Vital Signs:  Patient profile:   65 year old female Height:      63 inches Weight:      205 pounds BMI:     36.45 Temp:     97.9 degrees F oral Pulse rate:   78 / minute Pulse rhythm:   regular Resp:     18 per minute BP sitting:   120 / 78  (right arm) Cuff size:   large  Vitals Entered By: Mervin Kung CMA [facility]) (October 18, 2010 10:24 AM) Is Patient Diabetic? Yes   Primary Care Provider:  [name] FNP   History of Present Illness: Sheryl [name] is a 65 year old female who presents today for follow up of her asthma.  Notes that her symptoms improved even before she filled her Singulair.  She then filled her Singulair and started taking on an every other day basis.  Denies current wheezing.  Does not + stiffness related to her RA.  Patient has not been needing to use her rescue inhaler.    Allergies: 1)  ! Levaquin 2)  ! Ceftin (Cefuroxime Axetil)  Past History:  Past Medical History: Last updated: 05/15/2010 Hyperlipidemia Hypothyroidism rheumatoid arthitis pernicious anemia Asthma DM 2 Tobacco abuse hyperlipidemia  Past Surgical History: Last updated: 06/05/2009 2006 Left Knee Arthrocopic surgery 2001 Right Knee GYN surgery D& C 1975  Review of Systems       see HPI  Physical Exam  General:  Overweight white female, awake, alert and NAD Head:  Normocephalic and atraumatic without obvious abnormalities. No apparent alopecia or balding. Lungs:  Normal respiratory effort, chest expands symmetrically. Lungs are clear to auscultation, no crackles or wheezes. Heart:  Normal rate and regular rhythm. S1 and S2 normal without gallop, murmur, click, rub or other extra sounds. Extremities:  No peripheral edema.  Diabetes Management Exam:    Eye Exam:       Eye Exam done elsewhere          Date: 10/09/2010          Results: normal, cataract left eye          Done by: Dr Hazle Quant   Impression &  Recommendations:  Problem # 1:  ASTHMA (ICD-493.90) Assessment Improved Symptoms improved.  She will try discontinuation of singulair.  Recommended that if her frequency of rescue inhaler use is >2x a week that she should restart singulair on a daily basis.   Her updated medication list for this problem includes:    Advair Diskus 250-50 Mcg/dose Aepb (Fluticasone-salmeterol) ..... One puff twice daily    Ventolin Hfa 108 (90 Base) Mcg/act Aers (Albuterol sulfate) .Marland Kitchen... 2 puffs every 6 hours as needed    Singulair 10 Mg Tabs (Montelukast sodium) ..... One tablet by mouth once daily  Complete Medication List: 1)  Advair Diskus 250-50 Mcg/dose Aepb (Fluticasone-salmeterol) .... One puff twice daily 2)  Ventolin Hfa 108 (90 Base) Mcg/act Aers (Albuterol sulfate) .... 2 puffs every 6 hours as needed 3)  Pantoprazole Sodium 40 Mg Tbec (Pantoprazole sodium) .... Take 1 tablet by mouth once a day 4)  Ambien 5 Mg Tabs (Zolpidem tartrate) .... One tablet by mouth at bedtime as needed 5)  Diclofenac Sodium 75 Mg Tbec (Diclofenac sodium) .... Take 1 tablet by mouth two times a day as needed 6)  Fluticasone Propionate 50 Mcg/act Susp (Fluticasone propionate) .... 2 sprays each nostril once daily as needed 7)  Zetia  10 Mg Tabs (Ezetimibe) .... Take 1 tablet by mouth once a day 8)  Levothroid 100 Mcg Tabs (Levothyroxine sodium) .... Take one tablet once daily 9)  Patanol 0.1 % Soln (Olopatadine hcl) .... One drop in each eye twice daily as needed 10)  Methotrexate 2.5 Mg Tabs (Methotrexate sodium) .Marland Kitchen.. 10 tablets by mouth once weekly 11)  B Complex Tabs (B complex vitamins) .... One tablet by mouth daily 12)  Plaquenil 200 Mg Tabs (Hydroxychloroquine sulfate) .... Take 1 tablet by mouth two times a day 13)  Vitamin D 1000 Unit Tabs (Cholecalciferol) .... 2 tabs by mouth daily 14)  Vitamin E 1000 Unit Caps (Vitamin e) .... On cap 3x weekly 15)  Fish Oil Double Strength 1200 Mg Caps (Omega-3 fatty acids)  .... One cap three times a day 16)  Aspirin Ec 81 Mg Tbec (Aspirin) .... One tab by mouth daily 17)  Nystatin 100000 Unit/gm Crea (Nystatin) .... Apply twice daily to affected area until healed 18)  Benazepril Hcl 5 Mg Tabs (Benazepril hcl) .... One half tablet by mouth once daily 19)  Singulair 10 Mg Tabs (Montelukast sodium) .... One tablet by mouth once daily  Patient Instructions: 1)  You can try discontinuing singulair for the next few weeks.  If you find that you are needing your albuterol rescue inhaler more than 2x a week after you stop singulair, then resume Sinuglair once daily. 2)  Follow up in early April.   Orders Added: 1)  Est. Patient Level III [04540]     Current Allergies (reviewed today): ! LEVAQUIN ! CEFTIN (CEFUROXIME AXETIL)   Vital Signs:  Patient Profile:   65 year old female Height:     63 inches Weight:      205 pounds BMI:     36.45 Temp:     97.9 degrees F oral Pulse rate:   78 / minute Pulse rhythm:   regular Resp:     18 per minute BP sitting:   120 / 78 Cuff size:   large

## 2010-10-30 ENCOUNTER — Encounter: Payer: Self-pay | Admitting: Family

## 2010-11-05 NOTE — Miscellaneous (Signed)
Summary: eye exam  Clinical Lists Changes  Medications: Added new medication of SYSTANE 0.4-0.3 % SOLN (POLYETHYL GLYCOL-PROPYL GLYCOL) 1 drop 2-3 times a day. Observations: Added new observation of DMEYEEXAMNXT: 09/2011 (10/30/2010 15:37) Added new observation of DMEYEEXMRES: myopia, presbyopia (10/09/2010 15:38) Added new observation of EYE EXAM BY: [facility] (10/09/2010 15:38) Added new observation of DIAB EYE EX: myopia, presbyopia (10/09/2010 15:38)        Diabetes Management Exam:    Eye Exam:       Eye Exam done elsewhere          Date: 10/09/2010          Results: myopia, presbyopia          Done by: [facility]

## 2010-11-07 LAB — URINALYSIS, ROUTINE W REFLEX MICROSCOPIC
Bilirubin Urine: NEGATIVE
Glucose, UA: NEGATIVE mg/dL
Hgb urine dipstick: NEGATIVE
Protein, ur: NEGATIVE mg/dL
Specific Gravity, Urine: 1.011 (ref 1.005–1.030)

## 2010-11-07 LAB — GLUCOSE, CAPILLARY
Glucose-Capillary: 106 mg/dL — ABNORMAL HIGH (ref 70–99)
Glucose-Capillary: 91 mg/dL (ref 70–99)
Glucose-Capillary: 120 mg/dL — ABNORMAL HIGH (ref 70–99)
Glucose-Capillary: 140 mg/dL — ABNORMAL HIGH (ref 70–99)
Glucose-Capillary: 162 mg/dL — ABNORMAL HIGH (ref 70–99)
Glucose-Capillary: 258 mg/dL — ABNORMAL HIGH (ref 70–99)

## 2010-11-07 LAB — CBC
HCT: 38 % (ref 36.0–46.0)
HCT: 42 % (ref 36.0–46.0)
Hemoglobin: 14.1 g/dL (ref 12.0–15.0)
MCH: 31 pg (ref 26.0–34.0)
MCHC: 33.6 g/dL (ref 30.0–36.0)
MCHC: 33.7 g/dL (ref 30.0–36.0)
MCV: 93.1 fL (ref 78.0–100.0)
Platelets: 209 10*3/uL (ref 150–400)
RBC: 4.08 MIL/uL (ref 3.87–5.11)
RDW: 15.5 % (ref 11.5–15.5)

## 2010-11-07 LAB — URINE CULTURE

## 2010-11-07 LAB — DIFFERENTIAL
Basophils Absolute: 0 10*3/uL (ref 0.0–0.1)
Basophils Relative: 0 % (ref 0–1)
Basophils Relative: 0 % (ref 0–1)
Eosinophils Absolute: 0 10*3/uL (ref 0.0–0.7)
Lymphocytes Relative: 7 % — ABNORMAL LOW (ref 12–46)
Monocytes Relative: 3 % (ref 3–12)
Neutro Abs: 13 10*3/uL — ABNORMAL HIGH (ref 1.7–7.7)
Neutro Abs: 9.7 10*3/uL — ABNORMAL HIGH (ref 1.7–7.7)
Neutrophils Relative %: 82 % — ABNORMAL HIGH (ref 43–77)

## 2010-11-07 LAB — COMPREHENSIVE METABOLIC PANEL
ALT: 25 U/L (ref 0–35)
AST: 26 U/L (ref 0–37)
Albumin: 3.3 g/dL — ABNORMAL LOW (ref 3.5–5.2)
BUN: 12 mg/dL (ref 6–23)
BUN: 13 mg/dL (ref 6–23)
Calcium: 8.4 mg/dL (ref 8.4–10.5)
Calcium: 8.6 mg/dL (ref 8.4–10.5)
Chloride: 105 mEq/L (ref 96–112)
Creatinine, Ser: 0.88 mg/dL (ref 0.4–1.2)
GFR calc non Af Amer: >60 mL/min (ref >60)
Glucose, Bld: 368 mg/dL — ABNORMAL HIGH (ref 70–99)
Glucose, Bld: 96 mg/dL (ref 70–99)
Sodium: 135 mEq/L (ref 135–145)
Total Bilirubin: 0.4 mg/dL (ref 0.3–1.2)
Total Bilirubin: 0.6 mg/dL (ref 0.3–1.2)
Total Protein: 7.2 g/dL (ref 6.0–8.3)
Total Protein: 8.3 g/dL (ref 6.0–8.3)

## 2010-11-07 LAB — CK TOTAL AND CKMB (NOT AT ARMC)
CK, MB: 4.7 ng/mL — ABNORMAL HIGH (ref 0.3–4.0)
Relative Index: 4.2 — ABNORMAL HIGH (ref 0.0–2.5)
Relative Index: 4.4 — ABNORMAL HIGH (ref 0.0–2.5)
Relative Index: 4.5 — ABNORMAL HIGH (ref 0.0–2.5)
Total CK: 103 U/L (ref 7–177)

## 2010-11-07 LAB — MAGNESIUM: Magnesium: 2.3 mg/dL (ref 1.5–2.5)

## 2010-11-07 LAB — TROPONIN I
Troponin I: 0.01 ng/mL (ref 0.00–0.06)
Troponin I: 0.02 ng/mL (ref 0.00–0.06)

## 2010-11-07 LAB — CULTURE, BLOOD (ROUTINE X 2)
Culture  Setup Time: 201109290855
Culture: NO GROWTH

## 2010-11-07 LAB — HEMOGLOBIN A1C: Hgb A1c MFr Bld: 6.3 % — ABNORMAL HIGH (ref <5.7)

## 2010-11-12 NOTE — Letter (Signed)
Summary: [facility]   Imported By: Maryln Gottron 11/07/2010 12:43:00  _____________________________________________________________________  External Attachment:    Type:   Image     Comment:   External Document

## 2010-11-17 ENCOUNTER — Other Ambulatory Visit: Payer: Self-pay | Admitting: Family

## 2010-12-02 ENCOUNTER — Ambulatory Visit: Payer: Medicare Other | Admitting: Family

## 2010-12-03 ENCOUNTER — Other Ambulatory Visit: Payer: Self-pay | Admitting: Family

## 2010-12-04 ENCOUNTER — Other Ambulatory Visit: Payer: Self-pay | Admitting: Family

## 2010-12-12 ENCOUNTER — Other Ambulatory Visit: Payer: Self-pay | Admitting: Family

## 2010-12-12 NOTE — Telephone Encounter (Signed)
Refill left on pharmacy voicemail. 

## 2010-12-16 ENCOUNTER — Other Ambulatory Visit: Payer: Self-pay | Admitting: Family

## 2010-12-19 ENCOUNTER — Encounter: Payer: Self-pay | Admitting: Family

## 2010-12-24 ENCOUNTER — Telehealth: Payer: Self-pay | Admitting: Family

## 2010-12-24 ENCOUNTER — Other Ambulatory Visit (HOSPITAL_COMMUNITY)
Admission: RE | Admit: 2010-12-24 | Discharge: 2010-12-24 | Disposition: A | Discharge status: Home / Self Care | Payer: Medicare Other | Source: Ambulatory Visit | Attending: Family | Admitting: Family

## 2010-12-24 ENCOUNTER — Ambulatory Visit (INDEPENDENT_AMBULATORY_CARE_PROVIDER_SITE_OTHER): Payer: Medicare Other | Admitting: Family

## 2010-12-24 ENCOUNTER — Encounter: Payer: Self-pay | Admitting: Family

## 2010-12-24 DIAGNOSIS — R82998 Other abnormal findings in urine: Secondary | ICD-10-CM

## 2010-12-24 DIAGNOSIS — E039 Hypothyroidism, unspecified: Secondary | ICD-10-CM

## 2010-12-24 DIAGNOSIS — M069 Rheumatoid arthritis, unspecified: Secondary | ICD-10-CM

## 2010-12-24 DIAGNOSIS — L750 Bromhidrosis: Secondary | ICD-10-CM

## 2010-12-24 DIAGNOSIS — Z01419 Encounter for gynecological examination (general) (routine) without abnormal findings: Secondary | ICD-10-CM

## 2010-12-24 DIAGNOSIS — E785 Hyperlipidemia, unspecified: Secondary | ICD-10-CM

## 2010-12-24 DIAGNOSIS — G4700 Insomnia, unspecified: Secondary | ICD-10-CM

## 2010-12-24 DIAGNOSIS — D649 Anemia, unspecified: Secondary | ICD-10-CM

## 2010-12-24 DIAGNOSIS — Z124 Encounter for screening for malignant neoplasm of cervix: Secondary | ICD-10-CM | POA: Insufficient documentation

## 2010-12-24 DIAGNOSIS — Z0000 Encounter for general adult medical examination without abnormal findings: Secondary | ICD-10-CM

## 2010-12-24 DIAGNOSIS — N390 Urinary tract infection, site not specified: Secondary | ICD-10-CM

## 2010-12-24 DIAGNOSIS — R8290 Unspecified abnormal findings in urine: Secondary | ICD-10-CM

## 2010-12-24 LAB — POCT URINALYSIS DIPSTICK
Bilirubin, UA: NEGATIVE
Ketones, UA: NEGATIVE
Protein, UA: NEGATIVE
Spec Grav, UA: 1.005

## 2010-12-24 MED ORDER — FLUCONAZOLE 150 MG PO TABS: ORAL_TABLET | ORAL | Status: DC

## 2010-12-24 MED ORDER — NITROFURANTOIN MONOHYD MACRO 100 MG PO CAPS: 100.0000 mg | ORAL_CAPSULE | Freq: Two times a day (BID) | ORAL | Status: AC

## 2010-12-24 NOTE — Telephone Encounter (Signed)
Please call patient and let her know that her urine shows mild uti-  I have sent rx for macrobid to her pharmacy.

## 2010-12-24 NOTE — Progress Notes (Signed)
Subjective:    Patient ID: Sheryl [name], female    DOB: [birth year], 65 y.o.   MRN: [id number]  HPI    Subjective:   Patient here for Medicare annual wellness visit and management of other chronic and acute problems. Never had colonoscopy.    Insomnia- improved since she has been on the [place].    ACE- stopped as she was leaking urine.  Stopped ace and the urine leakage.  RA- this is being followedby Dr. Corliss Skains. Stable except some fatigue.     Risk factors:   Roster of Physicians Providing Medical Care to Patient: Dr.  Mcneil Sober- [facility]- Michell Heinrich  Activities of Daily Living  In your present state of health, do you have any difficulty performing the following activities? Preparing food and eating?: No  Bathing yourself: No  Getting dressed: No  Using the toilet:No  Moving around from place to place: No  In the past year have you fallen or had a near fall?:No     Home Safety: Has smoke detector and wears seat belts. No firearms. No excess sun exposure- wears sunscreen.  Diet and Exercise  Current exercise habits: 10 minutes of stretching/day 7 days a week.  Active in her garden 4-5 hours at a time- 3 days a week. Dietary issues discussed: healthy diet- has improved her diet.  Has cut out sugar.  Depression Screen  (Note: if answer to either of the following is "Yes", then a more complete depression screening is indicated)  Q1: Over the past two weeks, have you felt down, depressed or hopeless?no  Q2: Over the past two weeks, have you felt little interest or pleasure in doing things? no   The following portions of the patient's history were reviewed and updated as appropriate: allergies, current medications, past family history, past medical history, past social history, past surgical history and problem list.   Vision: Hearing: able hear forced whisper at 6 feet.   Body mass index: Cognitive Impairment Assessment:  cognition, memory and judgment appear normal.   Assessment:   Medicare wellness utd except for colonoscopy- will arrange. Pap performed today. Patient was  encouraged to continue exercise and healthy diet.  Plan:    During the course of the visit the patient was educated and counseled about appropriate screening and preventive services including:       Fall prevention   Screening mammography- completed  Bone densitometry screening  Diabetes screening  Nutrition counseling   Vaccines / LABS Zostavax / Pnemonccoal Vaccine  today  PSA  Patient Instructions (the written plan) was given to the patient.       Review of Systems  Constitutional: Negative for fever.  HENT: Negative for hearing loss.   Eyes: Negative for visual disturbance.  Respiratory: Negative for cough and shortness of breath.   Cardiovascular: Negative for chest pain and leg swelling.  Gastrointestinal:       Recent episode of food poisoning with vomitting- resolved.   Genitourinary: Negative for dysuria.       + vaginal irritation.  Musculoskeletal:       Bilateral hand pain due to rheumatoid, as well ankles toes.    Neurological: Negative for weakness.  Hematological: Negative for adenopathy.  Psychiatric/Behavioral:       Denis symptoms of depression.       Objective:   Physical Exam  Constitutional: She is oriented to person, place, and time. She appears well-developed and well-nourished.       Overweight  white female.  HENT:  Head: Normocephalic and atraumatic.  Right Ear: Tympanic membrane and ear canal normal.  Left Ear: Tympanic membrane and ear canal normal.  Mouth/Throat: Uvula is midline, oropharynx is clear and moist and mucous membranes are normal.  Neck: Normal range of motion. Neck supple.  Cardiovascular: Normal rate, regular rhythm and normal heart sounds.   No murmur heard. Pulmonary/Chest: Effort normal and breath sounds normal. No respiratory distress. She has no wheezes. She has  no rales.  Abdominal: Soft. Bowel sounds are normal. She exhibits no distension and no mass. There is no tenderness. There is no rebound and no guarding.  Genitourinary: Vagina normal and uterus normal. No vaginal discharge found.  Musculoskeletal: She exhibits no edema and no tenderness.       Patient is noted to have some swelling of PIP joints bilaterally in the hands.  Neurological: She is alert and oriented to person, place, and time.  Skin: Skin is warm and dry.  Psychiatric: She has a normal mood and affect. Her behavior is normal. Judgment and thought content normal.   breast exam: No masses noted, no axillary lymphadenopathy.     Past Medical History  Diagnosis Date  . Hyperlipidemia   . Thyroid disease     hypothyroid  . Anemia     pernicious  . Asthma   . Diabetes mellitus     type 2  . Tobacco abuse     History   Social History  . Marital Status: Single    Spouse Name: N/A    Number of Children: 1  . Years of Education: N/A   Occupational History  . unemployed    Social History Main Topics  . Smoking status: Current Some Day Smoker    Types: Cigarettes  . Smokeless tobacco: Not on file   Comment: < 1 daily. Uses smokeless cigarette.  . Alcohol Use: 0.6 oz/week    1 Glasses of wine per week  . Drug Use: Not on file  . Sexually Active: Not on file   Other Topics Concern  . Not on file   Social History Narrative   Regular exercise:  Stretching exercises. Resistance bandsCaffeine: 1 mug (2cus) daily.    Past Surgical History  Procedure Date  . Knee surgery 2006    arthroscopic left knee  . Knee surgery 2001    right knee  . Dilation and curettage of uterus 1975    Family History  Problem Relation Age of Onset  . Heart disease Other     CAD  . Diabetes Other   . Hyperlipidemia Other   . Stroke Other     Allergies  Allergen Reactions  . Cefuroxime Axetil     REACTION: asthma, cough  . Levofloxacin   . Statins     Muscle pain, syncope      Current Outpatient Prescriptions on File Prior to Visit  Medication Sig Dispense Refill  . ADVAIR DISKUS 250-50 MCG/DOSE AEPB inhale 1 dose by mouth twice a day  60 each  1  . albuterol (VENTOLIN HFA) 108 (90 BASE) MCG/ACT inhaler Inhale 2 puffs into the lungs every 6 (six) hours as needed.        Marland Kitchen aspirin 81 MG tablet Take 81 mg by mouth daily.        Marland Kitchen b complex vitamins tablet Take 1 tablet by mouth daily.        . benazepril (LOTENSIN) 5 MG tablet take 1/2 tablet by mouth once daily  15 tablet  0  . cholecalciferol (VITAMIN D) 1000 UNITS tablet Take 2 tablets by mouth daily.       . diclofenac (VOLTAREN) 75 MG EC tablet Take 75 mg by mouth 2 (two) times daily.        Marland Kitchen ezetimibe (ZETIA) 10 MG tablet Take 10 mg by mouth daily.        . fluticasone (FLONASE) 50 MCG/ACT nasal spray 2 sprays by Each Nare route daily as needed.        . hydroxychloroquine (PLAQUENIL) 200 MG tablet Take 200 mg by mouth 2 (two) times daily.        Marland Kitchen levothyroxine (SYNTHROID, LEVOTHROID) 100 MCG tablet take 1 tablet by mouth once daily  30 tablet  1  . methotrexate (RHEUMATREX) 2.5 MG tablet Take 10 tablets by mouth once a week. Caution:Chemotherapy. Protect from light.       . montelukast (SINGULAIR) 10 MG tablet Take 10 mg by mouth daily.        Marland Kitchen nystatin (MYCOSTATIN) cream Apply 1 application topically 2 (two) times daily. To affected area until healed       . olopatadine (PATANOL) 0.1 % ophthalmic solution Place 1 drop into both eyes 2 (two) times daily as needed.        . Omega-3 Fatty Acids (FISH OIL) 1200 MG CAPS Take 1 capsule by mouth 3 (three) times daily.        . pantoprazole (PROTONIX) 40 MG tablet take 1 tablet by mouth once daily  30 tablet  1  . Polyethyl Glycol-Propyl Glycol (SYSTANE) 0.4-0.3 % SOLN Apply 1 drop to eye. 2 to 3 times a day.       . vitamin E 1000 UNIT capsule Take 1 capsule 3 times a week.       . zolpidem (AMBIEN) 5 MG tablet take 1 tablet by mouth at bedtime if needed  30  tablet  0    BP 110/80  Pulse 60  Temp(Src) 98.3 F (36.8 C) (Oral)  Resp 16  Ht 5' 2.99" (1.6 m)  Wt 200 lb 1.9 oz (90.774 kg)  BMI 35.46 kg/m2  '   Assessment & Plan:

## 2010-12-24 NOTE — Telephone Encounter (Signed)
Left message on machine to return my call. 

## 2010-12-24 NOTE — Patient Instructions (Addendum)
You will be contacted about your referral to GI.   We will contact you about your pap smear results.   Complete your fasting lab work tomorrow on the first floor.

## 2010-12-25 DIAGNOSIS — Z0000 Encounter for general adult medical examination without abnormal findings: Secondary | ICD-10-CM | POA: Insufficient documentation

## 2010-12-25 DIAGNOSIS — G4700 Insomnia, unspecified: Secondary | ICD-10-CM

## 2010-12-25 HISTORY — DX: Encounter for general adult medical examination without abnormal findings: Z00.00

## 2010-12-25 HISTORY — DX: Insomnia, unspecified: G47.00

## 2010-12-25 NOTE — Telephone Encounter (Signed)
Left detailed message on voicemail re: Rx for UTI per [name]'s instruction and to call if any questions.

## 2010-12-25 NOTE — Assessment & Plan Note (Signed)
Patient reports that this is improved with the use of Ambien when necessary

## 2010-12-25 NOTE — Assessment & Plan Note (Signed)
This is stable and is being managed by Dr. Titus Dubin.

## 2010-12-25 NOTE — Assessment & Plan Note (Signed)
65 yr old female presents today for her Medicare wellness exam. She was counseled on exercise and weight loss. She will be referred for screening colonoscopy. Other health care maintenance is up-to-date.

## 2010-12-26 LAB — URINE CULTURE: Colony Count: 8000

## 2010-12-26 NOTE — Progress Notes (Signed)
For the pelvic and breast exam, 7 total bullets have to be documented to bill. Please see the handout I provided to you regard billing breast & pelvic exam.  Thanks Molli Hazard

## 2010-12-27 ENCOUNTER — Encounter: Payer: Self-pay | Admitting: Family

## 2010-12-31 ENCOUNTER — Encounter: Payer: Self-pay | Admitting: Family

## 2010-12-31 LAB — CBC WITH DIFFERENTIAL/PLATELET
Basophils Relative: 1 % (ref 0–1)
Eosinophils Absolute: 0.2 10*3/uL (ref 0.0–0.7)
HCT: 42.6 % (ref 36.0–46.0)
Hemoglobin: 14.2 g/dL (ref 12.0–15.0)
Lymphs Abs: 1.3 10*3/uL (ref 0.7–4.0)
MCH: 31.7 pg (ref 26.0–34.0)
MCHC: 33.3 g/dL (ref 30.0–36.0)
Monocytes Absolute: 0.3 10*3/uL (ref 0.1–1.0)
Monocytes Relative: 7 % (ref 3–12)
Neutro Abs: 2.7 10*3/uL (ref 1.7–7.7)
RBC: 4.48 MIL/uL (ref 3.87–5.11)

## 2010-12-31 LAB — LIPID PANEL
HDL: 28 mg/dL — ABNORMAL LOW (ref >39)
Total CHOL/HDL Ratio: 5.6 Ratio
Triglycerides: 145 mg/dL (ref <150)

## 2010-12-31 LAB — HEPATIC FUNCTION PANEL
ALT: 26 U/L (ref 0–35)
AST: 26 U/L (ref 0–37)
Bilirubin, Direct: 0.1 mg/dL (ref 0.0–0.3)
Indirect Bilirubin: 0.3 mg/dL (ref 0.0–0.9)
Total Protein: 6.9 g/dL (ref 6.0–8.3)

## 2010-12-31 LAB — HEMOGLOBIN A1C: Hgb A1c MFr Bld: 6.1 % — ABNORMAL HIGH (ref <5.7)

## 2010-12-31 LAB — TSH: TSH: 1.538 u[IU]/mL (ref 0.350–4.500)

## 2011-01-08 ENCOUNTER — Telehealth: Payer: Self-pay | Admitting: *Deleted

## 2011-01-08 NOTE — Telephone Encounter (Signed)
Pt left voice message that she was returning our call. Attempted to reach pt and left message for her to return my call.

## 2011-01-09 ENCOUNTER — Encounter: Payer: Self-pay | Admitting: *Deleted

## 2011-01-09 ENCOUNTER — Telehealth: Payer: Self-pay | Admitting: *Deleted

## 2011-01-09 NOTE — Telephone Encounter (Signed)
Pt returned my call. Requested Pap Smear results, wanted to know if she should be concerned about her RDW count. Advised pt Pap smear was normal. RDW only slightly elevated, no concern at this time and nothing that pt should or can do to correct this. Please advise if there is any other information I should relay to pt.

## 2011-01-09 NOTE — Telephone Encounter (Signed)
Sent NOS letter. Cancelled colon

## 2011-01-10 NOTE — Telephone Encounter (Signed)
Pt advised that [name] was in agreement.

## 2011-01-10 NOTE — Telephone Encounter (Signed)
I agree

## 2011-01-12 ENCOUNTER — Other Ambulatory Visit: Payer: Self-pay | Admitting: Family

## 2011-01-16 ENCOUNTER — Encounter: Payer: Self-pay | Admitting: Family

## 2011-01-23 ENCOUNTER — Other Ambulatory Visit: Payer: Self-pay | Admitting: Family

## 2011-01-23 ENCOUNTER — Other Ambulatory Visit: Payer: Medicare Other | Admitting: Internal Medicine

## 2011-02-04 ENCOUNTER — Ambulatory Visit: Payer: Medicare Other | Admitting: Internal Medicine

## 2011-02-06 ENCOUNTER — Other Ambulatory Visit: Payer: Self-pay | Admitting: Family

## 2011-02-09 ENCOUNTER — Other Ambulatory Visit: Payer: Self-pay | Admitting: Family

## 2011-02-11 ENCOUNTER — Other Ambulatory Visit: Payer: Self-pay | Admitting: Family

## 2011-03-02 ENCOUNTER — Other Ambulatory Visit: Payer: Self-pay | Admitting: Family

## 2011-03-03 ENCOUNTER — Other Ambulatory Visit: Payer: Self-pay | Admitting: Family

## 2011-03-04 NOTE — Telephone Encounter (Signed)
Ok to refill #30 no refills 

## 2011-03-05 NOTE — Telephone Encounter (Signed)
Ambien refill left on pharmacy voicemail.

## 2011-03-17 ENCOUNTER — Encounter: Payer: Self-pay | Admitting: Family

## 2011-03-17 DIAGNOSIS — M3500 Sicca syndrome, unspecified: Secondary | ICD-10-CM

## 2011-03-17 HISTORY — DX: Sjogren syndrome, unspecified: M35.00

## 2011-04-02 ENCOUNTER — Other Ambulatory Visit: Payer: Self-pay | Admitting: Family

## 2011-04-03 NOTE — Telephone Encounter (Signed)
Refill left on pharmacy voicemail; #30 x no refills. 

## 2011-04-18 ENCOUNTER — Ambulatory Visit (HOSPITAL_BASED_OUTPATIENT_CLINIC_OR_DEPARTMENT_OTHER)
Admission: RE | Admit: 2011-04-18 | Discharge: 2011-04-18 | Disposition: A | Discharge status: Home / Self Care | Payer: Medicare Other | Source: Ambulatory Visit | Attending: Family | Admitting: Family

## 2011-04-18 ENCOUNTER — Encounter: Payer: Self-pay | Admitting: Family

## 2011-04-18 ENCOUNTER — Ambulatory Visit (INDEPENDENT_AMBULATORY_CARE_PROVIDER_SITE_OTHER): Payer: Medicare Other | Admitting: Family

## 2011-04-18 VITALS — BP 126/84 | HR 72 | Temp 97.8°F | Resp 16 | Ht 62.99 in | Wt 194.1 lb

## 2011-04-18 DIAGNOSIS — E039 Hypothyroidism, unspecified: Secondary | ICD-10-CM

## 2011-04-18 DIAGNOSIS — Z1231 Encounter for screening mammogram for malignant neoplasm of breast: Secondary | ICD-10-CM

## 2011-04-18 DIAGNOSIS — M069 Rheumatoid arthritis, unspecified: Secondary | ICD-10-CM

## 2011-04-18 DIAGNOSIS — Z1239 Encounter for other screening for malignant neoplasm of breast: Secondary | ICD-10-CM

## 2011-04-18 DIAGNOSIS — J45909 Unspecified asthma, uncomplicated: Secondary | ICD-10-CM

## 2011-04-18 DIAGNOSIS — I10 Essential (primary) hypertension: Secondary | ICD-10-CM

## 2011-04-18 DIAGNOSIS — K219 Gastro-esophageal reflux disease without esophagitis: Secondary | ICD-10-CM

## 2011-04-18 LAB — BASIC METABOLIC PANEL
CO2: 27 mEq/L (ref 19–32)
Calcium: 9.2 mg/dL (ref 8.4–10.5)
Potassium: 4.6 mEq/L (ref 3.5–5.3)
Sodium: 134 mEq/L — ABNORMAL LOW (ref 135–145)

## 2011-04-18 LAB — TSH: TSH: 0.841 u[IU]/mL (ref 0.350–4.500)

## 2011-04-18 NOTE — Patient Instructions (Addendum)
Schedule mammogram on the first floor.  Complete lab work on the first floor.  Stop advair, start symbicort.  Let me know if you would like to continue and we will send prescription to your pharmacy. Follow up in 3 months.

## 2011-04-18 NOTE — Assessment & Plan Note (Signed)
This is being managed by Dr. Corliss Skains.  Clinically stable.

## 2011-04-18 NOTE — Assessment & Plan Note (Signed)
Handout provided to the pt.  Recommended small meals, nothing to eat for several hours before going to bed, discussed foods to avoid.  Could try Zegerid, but she is in the donut hole and cannot afford this med change at this time.

## 2011-04-18 NOTE — Progress Notes (Signed)
Subjective:    Patient ID: Sheryl [name], female    DOB: [birth year], 65 y.o.   MRN: [id number]  HPI  Ms.  Sheryl [name] is a 65 yr old female who presents today for follow up.  GERD- this is bothering her.  Notes several episodes of vomitting after eating meat- has stopped eating meat which has helped with her symptosm. Also feels that the advair is contributing to her reflux symptoms.  Symptoms improve if she does not eat after 5pm.    Asthma- feels that this is in control unless she is in contact with mold.    RA-  Feels run down by methotrexate and tells me that she wishes to discuss alternatives with Dr. Corliss Skains at her upcoming visit. Notes that she has stopped refined sugar and this has helped lessen some the the pain and joint swelling in her fingers.   Review of Systems See HPI  Past Medical History  Diagnosis Date  . Hyperlipidemia   . Thyroid disease     hypothyroid  . Anemia     pernicious  . Asthma   . Diabetes mellitus     type 2  . Tobacco abuse   . Sjogren's syndrome 03/17/2011    History   Social History  . Marital Status: Single    Spouse Name: N/A    Number of Children: 1  . Years of Education: N/A   Occupational History  . unemployed    Social History Main Topics  . Smoking status: Current Some Day Smoker    Types: Cigarettes  . Smokeless tobacco: Not on file   Comment: 3 cigarettes a month. Uses smokeless cigarette.  . Alcohol Use: 0.6 oz/week    1 Glasses of wine per week  . Drug Use: Not on file  . Sexually Active: Not on file   Other Topics Concern  . Not on file   Social History Narrative   Regular exercise:  Stretching exercises. Resistance bandsCaffeine: 1 mug (2cus) daily.    Past Surgical History  Procedure Date  . Knee surgery 2006    arthroscopic left knee  . Knee surgery 2001    right knee  . Dilation and curettage of uterus 1975    Family History  Problem Relation Age of Onset  . Heart disease Other     CAD  .  Diabetes Other   . Hyperlipidemia Other   . Stroke Other     Allergies  Allergen Reactions  . Cefuroxime Axetil     REACTION: asthma, cough  . Levofloxacin   . Statins     Muscle pain, syncope    Current Outpatient Prescriptions on File Prior to Visit  Medication Sig Dispense Refill  . albuterol (VENTOLIN HFA) 108 (90 BASE) MCG/ACT inhaler Inhale 2 puffs into the lungs every 6 (six) hours as needed.        Marland Kitchen aspirin 81 MG tablet Take 81 mg by mouth daily.        Marland Kitchen b complex vitamins tablet Take 1 tablet by mouth daily.        . benazepril (LOTENSIN) 5 MG tablet take 1/2 tablet by mouth once daily  15 tablet  0  . cholecalciferol (VITAMIN D) 1000 UNITS tablet Take 2 tablets by mouth daily.       . diclofenac (VOLTAREN) 75 MG EC tablet Take 75 mg by mouth 2 (two) times daily.        . fluconazole (DIFLUCAN) 150 MG tablet Take one tablet  today, may repeat once in 3 days if vaginal irritation is not resolved.  2 tablet  0  . fluticasone (FLONASE) 50 MCG/ACT nasal spray 2 sprays by Each Nare route daily as needed.        . hydroxychloroquine (PLAQUENIL) 200 MG tablet Take 200 mg by mouth 2 (two) times daily.        Marland Kitchen levothyroxine (SYNTHROID, LEVOTHROID) 100 MCG tablet take 1 tablet by mouth once daily  30 tablet  2  . methotrexate (RHEUMATREX) 2.5 MG tablet Take 10 tablets by mouth once a week. Caution:Chemotherapy. Protect from light.       . nystatin (MYCOSTATIN) cream Apply 1 application topically 2 (two) times daily. To affected area until healed       . olopatadine (PATANOL) 0.1 % ophthalmic solution Place 1 drop into both eyes 2 (two) times daily as needed.        . Omega-3 Fatty Acids (FISH OIL) 1200 MG CAPS Take 1 capsule by mouth 3 (three) times daily.        . pantoprazole (PROTONIX) 40 MG tablet take 1 tablet by mouth once daily  30 tablet  1  . Polyethyl Glycol-Propyl Glycol (SYSTANE) 0.4-0.3 % SOLN Apply 1 drop to eye. 2 to 3 times a day.       Marland Kitchen SINGULAIR 10 MG tablet take  1 tablet by mouth once daily  30 tablet  3  . vitamin E 1000 UNIT capsule Take 1 capsule 3 times a week.       Marland Kitchen ZETIA 10 MG tablet take 1 tablet by mouth once daily  30 tablet  2  . zolpidem (AMBIEN) 5 MG tablet take 1 tablet by mouth at bedtime if needed  30 tablet  0    BP 126/84  Pulse 72  Temp(Src) 97.8 F (36.6 C) (Oral)  Resp 16  Ht 5' 2.99" (1.6 m)  Wt 194 lb 1.3 oz (88.034 kg)  BMI 34.39 kg/m2       Objective:   Physical Exam  Constitutional: She appears well-developed and well-nourished.  HENT:  Head: Normocephalic and atraumatic.  Cardiovascular: Normal rate and regular rhythm.   Pulmonary/Chest: Effort normal.  Psychiatric: She has a normal mood and affect. Her behavior is normal. Judgment and thought content normal.          Assessment & Plan:  symbicort 160/4.5 [id number] 2013 10  I offered to arrange a AAA screening ultrasound.  She is interested in completing a carotid duplex as well and wishes to look into the screening [facility] that comes to [place] and charges a flat rate to check both of these.  She will look into scheduling this and will let me know if she wishes to have [place] set something up through our system.   She requested EKG today- NSR without any acute changes.

## 2011-04-18 NOTE — Assessment & Plan Note (Signed)
Will try switching from Advair to symbicort to see if this helps with her GERD symptoms.  Samples provided today.

## 2011-04-21 ENCOUNTER — Encounter: Payer: Self-pay | Admitting: Family

## 2011-04-22 ENCOUNTER — Telehealth: Payer: Self-pay | Admitting: *Deleted

## 2011-04-22 DIAGNOSIS — E871 Hypo-osmolality and hyponatremia: Secondary | ICD-10-CM

## 2011-04-22 NOTE — Telephone Encounter (Signed)
Received message from pt stating she was told that her liver enzymes were elevated by her rheumatologist. Pt is requesting results from

## 2011-04-22 NOTE — Telephone Encounter (Signed)
Patient is requesting results from recent labs. Advised pt per lab letter that results are stable and shows mild decrease of sodium that will be checked at next visit. Pt wants to know possible causes for low sodium. She also reports that [facility] told her her liver enzymes were elevated and she may need to have some medications adjusted. Pt states that she requested the lab to send [place] copies of this blood work. Results have not been received yet. Spoke to Perrin at [place], [phone] and requested additional results. She will fax them to [place]. Please advise.

## 2011-04-22 NOTE — Telephone Encounter (Signed)
Spoke with pt re: sodium 134 and mild elevation of ast/alt.  Pt was instructed to follow up at the lab in 1 month to have these tests repeated. She verbalizes understanding.

## 2011-05-02 ENCOUNTER — Other Ambulatory Visit: Payer: Self-pay | Admitting: Family

## 2011-05-08 ENCOUNTER — Encounter: Payer: Self-pay | Admitting: Internal Medicine

## 2011-05-08 ENCOUNTER — Ambulatory Visit (INDEPENDENT_AMBULATORY_CARE_PROVIDER_SITE_OTHER): Payer: Medicare Other | Admitting: Internal Medicine

## 2011-05-08 DIAGNOSIS — G4700 Insomnia, unspecified: Secondary | ICD-10-CM

## 2011-05-08 DIAGNOSIS — R109 Unspecified abdominal pain: Secondary | ICD-10-CM

## 2011-05-08 DIAGNOSIS — J45909 Unspecified asthma, uncomplicated: Secondary | ICD-10-CM

## 2011-05-08 MED ORDER — AZITHROMYCIN 250 MG PO TABS: ORAL_TABLET | ORAL | Status: AC

## 2011-05-08 MED ORDER — ZOLPIDEM TARTRATE 5 MG PO TABS: 5.0000 mg | ORAL_TABLET | Freq: Every evening | ORAL | Status: DC | PRN

## 2011-05-08 MED ORDER — PREDNISONE 10 MG PO TABS: 20.0000 mg | ORAL_TABLET | Freq: Every day | ORAL | Status: AC

## 2011-05-09 DIAGNOSIS — R109 Unspecified abdominal pain: Secondary | ICD-10-CM | POA: Insufficient documentation

## 2011-05-09 NOTE — Assessment & Plan Note (Signed)
Schedule ruq [place]. Continue ppi. Followup if no improvement or worsening.

## 2011-05-09 NOTE — Assessment & Plan Note (Signed)
Possible infxs trigger. Begin abx po course to completion. Brief po course of prednisone. Continue albuterol prn. Followup if no improvement or worsening.

## 2011-05-09 NOTE — Assessment & Plan Note (Signed)
rf [employer]

## 2011-05-09 NOTE — Progress Notes (Signed)
  Subjective:    Patient ID: Sheryl [name], female    DOB: Aug 13, 1946, 65 y.o.   MRN: [id number]  HPI Pt presents to clinic for evaluation of possible asthma exacerbation. Notes 1 wk h/o dry cough with intermittent chest tightness relieved by albuterol mdi. Notes subjective wheezing without dyspnea. Cough now progressing to intermittently productive for yellow sputum. Believes may be having low grade fever as well. Has intermittent epigastric abdominal pain that may radiate to the back. Occurs postprandially. Is maintained on stable daily dose ppi without improvement. No nausea or vomitting. Suffers from chronic insomnia and requests refill of ambien. Tolerates without adverse effect. No other complaints.  Past Medical History  Diagnosis Date  . Hyperlipidemia   . Thyroid disease     hypothyroid  . Anemia     pernicious  . Asthma   . Diabetes mellitus     type 2  . Tobacco abuse   . Sjogren's syndrome 03/17/2011   Past Surgical History  Procedure Date  . Knee surgery 2006    arthroscopic left knee  . Knee surgery 2001    right knee  . Dilation and curettage of uterus 1975    reports that she has been smoking Cigarettes.  She does not have any smokeless tobacco history on file. She reports that she drinks about .6 ounces of alcohol per week. Her drug history not on file. family history includes Diabetes in her other; Heart disease in her other; Hyperlipidemia in her other; and Stroke in her other. Allergies  Allergen Reactions  . Cefuroxime Axetil     REACTION: asthma, cough  . Levofloxacin   . Statins     Muscle pain, syncope       Review of Systems see hpi     Objective:   Physical Exam  Nursing note and vitals reviewed. Constitutional: She appears well-developed and well-nourished. No distress.  HENT:  Head: Normocephalic and atraumatic.  Right Ear: External ear normal.  Left Ear: External ear normal.  Nose: Nose normal.  Mouth/Throat: Oropharynx is clear and  moist. No oropharyngeal exudate.  Eyes: Conjunctivae are normal. Right eye exhibits no discharge. Left eye exhibits no discharge.  Neck: Neck supple.  Cardiovascular: Normal rate, regular rhythm and normal heart sounds.  Exam reveals no gallop and no friction rub.   No murmur heard. Pulmonary/Chest: Effort normal and breath sounds normal. No respiratory distress. She has no wheezes. She has no rales.  Abdominal: Soft. Normal appearance and bowel sounds are normal. She exhibits no ascites and no mass. There is no hepatosplenomegaly. There is no tenderness. There is no rigidity, no guarding and negative Murphy's sign.  Lymphadenopathy:    She has no cervical adenopathy.  Neurological: She is alert.  Skin: Skin is warm and dry. She is not diaphoretic.  Psychiatric: She has a normal mood and affect.          Assessment & Plan:

## 2011-05-10 ENCOUNTER — Other Ambulatory Visit: Payer: Self-pay | Admitting: Family

## 2011-06-18 NOTE — Telephone Encounter (Signed)
Spoke to pt and reminded her of need to have sodium and alt/ast rechecked. Pt states she will return Friday to complete the tests.

## 2011-06-21 LAB — BASIC METABOLIC PANEL
CO2: 21 mEq/L (ref 19–32)
Chloride: 103 mEq/L (ref 96–112)
Creat: 0.88 mg/dL (ref 0.50–1.10)

## 2011-06-21 LAB — HEPATIC FUNCTION PANEL: Bilirubin, Direct: <0.1 mg/dL (ref 0.0–0.3)

## 2011-06-22 ENCOUNTER — Encounter: Payer: Self-pay | Admitting: Family

## 2011-06-22 ENCOUNTER — Telehealth: Payer: Self-pay | Admitting: Family

## 2011-06-22 NOTE — Telephone Encounter (Addendum)
Sheryl [name]- could you pls fax copy of her LFT's to Dr. Corliss Skains- rheumatologist. Also, pls call pt and let her know that her liver function tests are up a bit.  She is scheduled for the end of November.  I would like to see her in the next 2 weeks pls instead. Thanks.

## 2011-06-23 NOTE — Telephone Encounter (Signed)
Pt notified and scheduled f/u for 07/07/11 at 10:45am.

## 2011-06-23 NOTE — Telephone Encounter (Signed)
Results faxed to Dr Corliss Skains at [phone].  Left message on machine to return my call.

## 2011-07-06 ENCOUNTER — Other Ambulatory Visit: Payer: Self-pay | Admitting: Family

## 2011-07-07 ENCOUNTER — Encounter: Payer: Self-pay | Admitting: Family

## 2011-07-07 ENCOUNTER — Ambulatory Visit (INDEPENDENT_AMBULATORY_CARE_PROVIDER_SITE_OTHER): Payer: Medicare Other | Admitting: Family

## 2011-07-07 ENCOUNTER — Other Ambulatory Visit: Payer: Self-pay | Admitting: Family

## 2011-07-07 VITALS — BP 110/80 | HR 84 | Temp 98.1°F | Resp 16 | Ht 62.0 in | Wt 194.0 lb

## 2011-07-07 DIAGNOSIS — Z23 Encounter for immunization: Secondary | ICD-10-CM

## 2011-07-07 DIAGNOSIS — R739 Hyperglycemia, unspecified: Secondary | ICD-10-CM

## 2011-07-07 DIAGNOSIS — R7309 Other abnormal glucose: Secondary | ICD-10-CM

## 2011-07-07 DIAGNOSIS — R7989 Other specified abnormal findings of blood chemistry: Secondary | ICD-10-CM

## 2011-07-07 DIAGNOSIS — R109 Unspecified abdominal pain: Secondary | ICD-10-CM

## 2011-07-07 DIAGNOSIS — R110 Nausea: Secondary | ICD-10-CM

## 2011-07-07 MED ORDER — ZOLPIDEM TARTRATE 5 MG PO TABS: 5.0000 mg | ORAL_TABLET | Freq: Every evening | ORAL | Status: DC | PRN

## 2011-07-07 MED ORDER — LEVOTHYROXINE SODIUM 100 MCG PO TABS: 100.0000 ug | ORAL_TABLET | Freq: Every day | ORAL | Status: DC

## 2011-07-07 MED ORDER — FLUTICASONE-SALMETEROL 250-50 MCG/DOSE IN AEPB: 1.0000 | INHALATION_SPRAY | Freq: Two times a day (BID) | RESPIRATORY_TRACT | Status: DC

## 2011-07-07 NOTE — Patient Instructions (Addendum)
Please complete your lab work prior to leaving.  Call if you develop recurrent abdominal pain or nausea. Follow up in 2 months.

## 2011-07-07 NOTE — Assessment & Plan Note (Signed)
Clinically resolved. Check LFT's, lipase.  If normal will not persue any further work up.  If + will refer for abd ultrasound.

## 2011-07-07 NOTE — Progress Notes (Signed)
Subjective:    Patient ID: Sheryl [name], female    DOB: 1946-04-24, 65 y.o.   MRN: [id number]  HPI  Sheryl [name] is a 65 yr old female who presents today for follow up. She was seen in September for abdominal pain.   Abdominal pain- she reports that this has resolved.  Denies nausea.  She notes that she has had intermittent "terra cotta" colored stool.  Often accompanied her nausea. She reports normal colored stools at this point. She denies any black of bloody stools.      Elevated LFT's- She has stopped methotrexate.   She did not complete abd. Ultrasound mentioned last visit.  Review of Systems See HPI  Past Medical History  Diagnosis Date  . Hyperlipidemia   . Thyroid disease     hypothyroid  . Anemia     pernicious  . Asthma   . Diabetes mellitus     type 2  . Tobacco abuse   . Sjogren's syndrome 03/17/2011    History   Social History  . Marital Status: Single    Spouse Name: N/A    Number of Children: 1  . Years of Education: N/A   Occupational History  . unemployed    Social History Main Topics  . Smoking status: Current Some Day Smoker    Types: Cigarettes  . Smokeless tobacco: Not on file   Comment: 3 cigarettes a month. Uses smokeless cigarette.  . Alcohol Use: 0.6 oz/week    1 Glasses of wine per week  . Drug Use: Not on file  . Sexually Active: Not on file   Other Topics Concern  . Not on file   Social History Narrative   Regular exercise:  Stretching exercises. Resistance bandsCaffeine: 1 mug (2cus) daily.    Past Surgical History  Procedure Date  . Knee surgery 2006    arthroscopic left knee  . Knee surgery 2001    right knee  . Dilation and curettage of uterus 1975    Family History  Problem Relation Age of Onset  . Heart disease Other     CAD  . Diabetes Other   . Hyperlipidemia Other   . Stroke Other     Allergies  Allergen Reactions  . Cefuroxime Axetil     REACTION: asthma, cough  . Levofloxacin   . Statins       Muscle pain, syncope    Current Outpatient Prescriptions on File Prior to Visit  Medication Sig Dispense Refill  . albuterol (VENTOLIN HFA) 108 (90 BASE) MCG/ACT inhaler Inhale 2 puffs into the lungs every 6 (six) hours as needed.        Marland Kitchen aspirin 81 MG tablet Take 81 mg by mouth daily.        . diclofenac (VOLTAREN) 75 MG EC tablet Take 75 mg by mouth 2 (two) times daily as needed.       . fluticasone (FLONASE) 50 MCG/ACT nasal spray 2 sprays by Each Nare route daily as needed.        . nystatin (MYCOSTATIN) cream Apply 1 application topically 2 (two) times daily. To affected area until healed       . olopatadine (PATANOL) 0.1 % ophthalmic solution Place 1 drop into both eyes 2 (two) times daily as needed.        Bertram Gala Glycol-Propyl Glycol (SYSTANE) 0.4-0.3 % SOLN Apply 1 drop to eye. 2 to 3 times a day as needed.      Marland Kitchen b  complex vitamins tablet Take 1 tablet by mouth daily.        . benazepril (LOTENSIN) 5 MG tablet take 1/2 tablet by mouth once daily  15 tablet  0  . cholecalciferol (VITAMIN D) 1000 UNITS tablet Take 2 tablets by mouth daily.       . pantoprazole (PROTONIX) 40 MG tablet take 1 tablet by mouth once daily  30 tablet  2  . vitamin E 1000 UNIT capsule Take 1 capsule 3 times a week.       Marland Kitchen ZETIA 10 MG tablet take 1 tablet by mouth once daily  30 tablet  2    BP 110/80  Pulse 84  Temp(Src) 98.1 F (36.7 C) (Oral)  Resp 16  Ht 5\' 2"  (1.575 m)  Wt 194 lb (87.998 kg)  BMI 35.48 kg/m2       Objective:   Physical Exam  Constitutional: She appears well-developed and well-nourished. No distress.  HENT:  Head: Normocephalic and atraumatic.  Eyes: Conjunctivae are normal. Pupils are equal, round, and reactive to light.  Cardiovascular: Normal rate and regular rhythm.   No murmur heard. Pulmonary/Chest: Effort normal and breath sounds normal. No respiratory distress.  Abdominal: Soft. She exhibits no distension and no mass. There is no rebound.       Very  mild left upper quadrant tenderness to palpation without guarding.           Assessment & Plan:

## 2011-07-08 ENCOUNTER — Telehealth: Payer: Self-pay | Admitting: *Deleted

## 2011-07-08 ENCOUNTER — Encounter: Payer: Self-pay | Admitting: Family

## 2011-07-08 LAB — BASIC METABOLIC PANEL
BUN: 13 mg/dL (ref 6–23)
Chloride: 103 mEq/L (ref 96–112)
Glucose, Bld: 99 mg/dL (ref 70–99)
Potassium: 5 mEq/L (ref 3.5–5.3)

## 2011-07-08 LAB — HEPATIC FUNCTION PANEL
ALT: 32 U/L (ref 0–35)
AST: 36 U/L (ref 0–37)
Alkaline Phosphatase: 79 U/L (ref 39–117)
Bilirubin, Direct: 0.1 mg/dL (ref 0.0–0.3)
Indirect Bilirubin: 0.3 mg/dL (ref 0.0–0.9)
Total Protein: 7.4 g/dL (ref 6.0–8.3)

## 2011-07-08 NOTE — Telephone Encounter (Signed)
Message copied by Kathi Simpers on Tue Jul 08, 2011  5:17 PM ------      Message from: O'SULLIVAN, [name]      Created: Tue Jul 08, 2011  8:24 AM       Could you pls ask lab to add on LFT (diagnosis elevated liver function tests?) thanks

## 2011-07-08 NOTE — Telephone Encounter (Signed)
Test has been added with dx 790.6 per Marcelino Duster at [place].

## 2011-07-09 ENCOUNTER — Encounter: Payer: Self-pay | Admitting: Family

## 2011-07-14 ENCOUNTER — Ambulatory Visit: Payer: Medicare Other | Admitting: Family

## 2011-07-22 ENCOUNTER — Ambulatory Visit: Payer: Medicare Other | Admitting: Family

## 2011-08-01 ENCOUNTER — Other Ambulatory Visit: Payer: Self-pay | Admitting: Family

## 2011-08-15 ENCOUNTER — Other Ambulatory Visit: Payer: Self-pay | Admitting: Family

## 2011-08-15 NOTE — Telephone Encounter (Signed)
Please advise refill? 

## 2011-08-15 NOTE — Telephone Encounter (Signed)
OK to send #30 with zero refills.  

## 2011-08-15 NOTE — Telephone Encounter (Signed)
Call placed to [facility] [phone], verbal refill provided to [place].

## 2011-09-02 ENCOUNTER — Telehealth: Payer: Self-pay | Admitting: *Deleted

## 2011-09-02 ENCOUNTER — Encounter: Payer: Self-pay | Admitting: Family

## 2011-09-02 ENCOUNTER — Ambulatory Visit (INDEPENDENT_AMBULATORY_CARE_PROVIDER_SITE_OTHER): Payer: Medicare Other | Admitting: Family

## 2011-09-02 DIAGNOSIS — H109 Unspecified conjunctivitis: Secondary | ICD-10-CM

## 2011-09-02 DIAGNOSIS — J329 Chronic sinusitis, unspecified: Secondary | ICD-10-CM | POA: Diagnosis not present

## 2011-09-02 MED ORDER — DOXYCYCLINE HYCLATE 100 MG PO TABS: 100.0000 mg | ORAL_TABLET | Freq: Two times a day (BID) | ORAL | Status: DC

## 2011-09-02 MED ORDER — DOXYCYCLINE HYCLATE 100 MG PO CAPS: 100.0000 mg | ORAL_CAPSULE | Freq: Two times a day (BID) | ORAL | Status: DC

## 2011-09-02 MED ORDER — NEOMYCIN-POLYMYXIN-HC OP SUSP: OPHTHALMIC | Status: DC

## 2011-09-02 NOTE — Patient Instructions (Signed)
Please call if your symptoms worsen or do not improve

## 2011-09-02 NOTE — Telephone Encounter (Signed)
Received call from [facility] stating doxycycline is on back order and is unavailable from all [facility] as well as [facility]. Doxycycline capsules are available at the [facility] pharmacy and Rx has been sent to them per Sandford Craze, NP. Attempted to notify pt and left detailed message on home and cell voicemails.

## 2011-09-02 NOTE — Assessment & Plan Note (Signed)
Will plan to treat with cortisporin drops.

## 2011-09-02 NOTE — Assessment & Plan Note (Signed)
Will plan to treat with doxycyline given multiple drug allergies.

## 2011-09-02 NOTE — Progress Notes (Signed)
Subjective:    Patient ID: Sheryl [name], female    DOB: Mar 26, 1946, 66 y.o.   MRN: [id number]  HPI  Sheryl [name] is 66 yr old female who presents today to discuss her sinus congestion.  Symptoms started about 1 month ago.  She reports that symptoms worsened due to turning on her heat.  She notes chronic post nasal drip and "fullness" in sinuses. She has yellow/green nasal discharge.  Denies associated fever.  She does report that her eyes have had "green gook and are stuck shut."     Review of Systems See HPI  Past Medical History  Diagnosis Date  . Hyperlipidemia   . Thyroid disease     hypothyroid  . Anemia     pernicious  . Asthma   . Diabetes mellitus     type 2  . Tobacco abuse   . Sjogren's syndrome 03/17/2011    History   Social History  . Marital Status: Single    Spouse Name: N/A    Number of Children: 1  . Years of Education: N/A   Occupational History  . unemployed    Social History Main Topics  . Smoking status: Current Some Day Smoker    Types: Cigarettes  . Smokeless tobacco: Not on file   Comment: 3 cigarettes a month. Uses smokeless cigarette.  . Alcohol Use: 0.6 oz/week    1 Glasses of wine per week  . Drug Use: Not on file  . Sexually Active: Not on file   Other Topics Concern  . Not on file   Social History Narrative   Regular exercise:  Stretching exercises. Resistance bandsCaffeine: 1 mug (2cus) daily.    Past Surgical History  Procedure Date  . Knee surgery 2006    arthroscopic left knee  . Knee surgery 2001    right knee  . Dilation and curettage of uterus 1975    Family History  Problem Relation Age of Onset  . Heart disease Other     CAD  . Diabetes Other   . Hyperlipidemia Other   . Stroke Other     Allergies  Allergen Reactions  . Cefuroxime Axetil     REACTION: asthma, cough  . Levofloxacin   . Statins     Muscle pain, syncope    Current Outpatient Prescriptions on File Prior to Visit  Medication  Sig Dispense Refill  . ADVAIR DISKUS 250-50 MCG/DOSE AEPB inhale 1 dose by mouth twice a day  1 each  0  . albuterol (VENTOLIN HFA) 108 (90 BASE) MCG/ACT inhaler Inhale 2 puffs into the lungs every 6 (six) hours as needed.        Marland Kitchen aspirin 81 MG tablet Take 81 mg by mouth daily.        . diclofenac (VOLTAREN) 75 MG EC tablet Take 75 mg by mouth 2 (two) times daily as needed.       . fluticasone (FLONASE) 50 MCG/ACT nasal spray 2 sprays by Each Nare route daily as needed.        . Fluticasone-Salmeterol (ADVAIR DISKUS) 250-50 MCG/DOSE AEPB Inhale 1 puff into the lungs 2 (two) times daily.  60 each  2  . levothyroxine (SYNTHROID, LEVOTHROID) 100 MCG tablet Take 1 tablet (100 mcg total) by mouth daily.  30 tablet  2  . multivitamin (THERAGRAN) per tablet Take 1 tablet by mouth daily.        Marland Kitchen nystatin (MYCOSTATIN) cream Apply 1 application topically 2 (two) times daily.  To affected area until healed       . olopatadine (PATANOL) 0.1 % ophthalmic solution Place 1 drop into both eyes 2 (two) times daily as needed.        . pantoprazole (PROTONIX) 40 MG tablet take 1 tablet by mouth once daily  30 tablet  1  . pantoprazole (PROTONIX) 40 MG tablet Take 1 tablet by mouth daily.      Bertram Gala Glycol-Propyl Glycol (SYSTANE) 0.4-0.3 % SOLN Apply 1 drop to eye. 2 to 3 times a day as needed.      Marland Kitchen ZETIA 10 MG tablet take 1 tablet by mouth once daily  30 tablet  2  . zolpidem (AMBIEN) 5 MG tablet take 1 tablet by mouth at bedtime for sleep  30 tablet  0    BP 122/78  Pulse 72  Temp(Src) 98.2 F (36.8 C) (Oral)  Resp 18  Wt 193 lb 0.6 oz (87.562 kg)  SpO2 100%       Objective:   Physical Exam  Constitutional: She appears well-developed and well-nourished.  HENT:  Head: Normocephalic and atraumatic.       L TM is dull, right TM normal.   Eyes: Left conjunctiva is injected. Left conjunctiva has no hemorrhage.  Cardiovascular: Normal rate and regular rhythm.   No murmur  heard. Pulmonary/Chest: Effort normal and breath sounds normal. No respiratory distress. She has no wheezes. She has no rales. She exhibits no tenderness.  Musculoskeletal: She exhibits no edema.  Skin: Skin is warm and dry.  Psychiatric: She has a normal mood and affect. Her behavior is normal. Judgment and thought content normal.          Assessment & Plan:

## 2011-09-04 NOTE — Telephone Encounter (Signed)
Received call from pt that she has been unable to get doxycycline from her pharmacy and she will need alternative. I questioned if pt had checked with the [facility] pharmacy and she stated she had not. Pt states she has not checked her messages and did not know we had sent Rx there. Pharmacy still has rx ready to be picked up and pt states she will get it today.

## 2011-09-05 ENCOUNTER — Other Ambulatory Visit: Payer: Self-pay | Admitting: *Deleted

## 2011-09-05 MED ORDER — LEVOTHYROXINE SODIUM 100 MCG PO TABS: 100.0000 ug | ORAL_TABLET | Freq: Every day | ORAL | Status: DC

## 2011-09-05 MED ORDER — PANTOPRAZOLE SODIUM 40 MG PO TBEC: 40.0000 mg | DELAYED_RELEASE_TABLET | Freq: Every day | ORAL | Status: DC

## 2011-09-05 MED ORDER — ZOLPIDEM TARTRATE 5 MG PO TABS: 5.0000 mg | ORAL_TABLET | Freq: Every evening | ORAL | Status: DC | PRN

## 2011-09-05 MED ORDER — FLUTICASONE-SALMETEROL 250-50 MCG/DOSE IN AEPB: 1.0000 | INHALATION_SPRAY | Freq: Two times a day (BID) | RESPIRATORY_TRACT | Status: DC

## 2011-09-05 NOTE — Telephone Encounter (Signed)
Received fax from [facility] pharmacy that pt is requesting to have all rxs filled with them. They are requesting new rxs on all meds. Refills sent for: levothyroxine, advair and protonix #30 x 2 refills each. Zolpidem 5mg  1 tablet at bedtime as needed for sleep #30 x no refills Called to [place].

## 2011-09-08 ENCOUNTER — Encounter: Payer: Self-pay | Admitting: Family

## 2011-09-08 ENCOUNTER — Ambulatory Visit: Payer: Medicare Other | Admitting: Family

## 2011-09-08 ENCOUNTER — Ambulatory Visit (INDEPENDENT_AMBULATORY_CARE_PROVIDER_SITE_OTHER): Payer: Medicare Other | Admitting: Family

## 2011-09-08 VITALS — BP 120/84 | HR 84 | Temp 98.1°F | Resp 18 | Wt 195.0 lb

## 2011-09-08 DIAGNOSIS — R030 Elevated blood-pressure reading, without diagnosis of hypertension: Secondary | ICD-10-CM | POA: Diagnosis not present

## 2011-09-08 DIAGNOSIS — E039 Hypothyroidism, unspecified: Secondary | ICD-10-CM

## 2011-09-08 DIAGNOSIS — H109 Unspecified conjunctivitis: Secondary | ICD-10-CM | POA: Diagnosis not present

## 2011-09-08 DIAGNOSIS — J329 Chronic sinusitis, unspecified: Secondary | ICD-10-CM

## 2011-09-08 DIAGNOSIS — Z79899 Other long term (current) drug therapy: Secondary | ICD-10-CM | POA: Diagnosis not present

## 2011-09-08 MED ORDER — OLOPATADINE HCL 0.1 % OP SOLN: 1.0000 [drp] | Freq: Two times a day (BID) | OPHTHALMIC | Status: DC | PRN

## 2011-09-08 NOTE — Progress Notes (Signed)
Subjective:    Patient ID: Sheryl [name], female    DOB: [birth year], 66 y.o.   MRN: [id number]  HPI  Ms.  Sheryl [name] is a 66 yr old female who presents today for follow up.  1) Conjunctivitis- She notes that this is improved.  No longer having green discharge from her eyes.  Using cortisporin.  Eyes still feel like they are "burning."  2) Sinusitis- treated with doxy. Improving, now nasal discharge is clear.  3) Hypothyroid- she continues levothyroxine. Feels well on current dose.   4) Elevated blood pressure-  Currently on low sodium diet only.     Review of Systems See HPI  Past Medical History  Diagnosis Date  . Hyperlipidemia   . Thyroid disease     hypothyroid  . Anemia     pernicious  . Asthma   . Diabetes mellitus     type 2  . Tobacco abuse   . Sjogren's syndrome 03/17/2011    History   Social History  . Marital Status: Single    Spouse Name: N/A    Number of Children: 1  . Years of Education: N/A   Occupational History  . unemployed    Social History Main Topics  . Smoking status: Current Some Day Smoker    Types: Cigarettes  . Smokeless tobacco: Not on file   Comment: 3 cigarettes a month. Uses smokeless cigarette.  . Alcohol Use: 0.6 oz/week    1 Glasses of wine per week  . Drug Use: Not on file  . Sexually Active: Not on file   Other Topics Concern  . Not on file   Social History Narrative   Regular exercise:  Stretching exercises. Resistance bandsCaffeine: 1 mug (2cus) daily.    Past Surgical History  Procedure Date  . Knee surgery 2006    arthroscopic left knee  . Knee surgery 2001    right knee  . Dilation and curettage of uterus 1975    Family History  Problem Relation Age of Onset  . Heart disease Other     CAD  . Diabetes Other   . Hyperlipidemia Other   . Stroke Other     Allergies  Allergen Reactions  . Cefuroxime Axetil     REACTION: asthma, cough  . Levofloxacin   . Statins     Muscle pain, syncope     Current Outpatient Prescriptions on File Prior to Visit  Medication Sig Dispense Refill  . albuterol (VENTOLIN HFA) 108 (90 BASE) MCG/ACT inhaler Inhale 2 puffs into the lungs every 6 (six) hours as needed.        Marland Kitchen aspirin 81 MG tablet Take 81 mg by mouth daily.        . diclofenac (VOLTAREN) 75 MG EC tablet Take 75 mg by mouth 2 (two) times daily as needed.       . doxycycline (VIBRAMYCIN) 100 MG capsule Take 1 capsule (100 mg total) by mouth 2 (two) times daily. For 10 days  20 capsule  0  . fluticasone (FLONASE) 50 MCG/ACT nasal spray 2 sprays by Each Nare route daily as needed.        . Fluticasone-Salmeterol (ADVAIR DISKUS) 250-50 MCG/DOSE AEPB Inhale 1 puff into the lungs 2 (two) times daily.  60 each  2  . levothyroxine (SYNTHROID, LEVOTHROID) 100 MCG tablet Take 1 tablet (100 mcg total) by mouth daily.  30 tablet  2  . multivitamin (THERAGRAN) per tablet Take 1 tablet by mouth daily.        Marland Kitchen  NEOMYCIN-POLYMYXIN-HC, OPHTH, SUSP 2 drops to each eye 4 times daily for 1 week.  7.5 mL  0  . nystatin (MYCOSTATIN) cream Apply 1 application topically 2 (two) times daily. To affected area until healed       . pantoprazole (PROTONIX) 40 MG tablet take 1 tablet by mouth once daily  30 tablet  1  . pantoprazole (PROTONIX) 40 MG tablet Take 1 tablet (40 mg total) by mouth daily.  30 tablet  2  . Polyethyl Glycol-Propyl Glycol (SYSTANE) 0.4-0.3 % SOLN Apply 1 drop to eye. 2 to 3 times a day as needed.      Marland Kitchen ZETIA 10 MG tablet take 1 tablet by mouth once daily  30 tablet  2  . zolpidem (AMBIEN) 5 MG tablet Take 1 tablet (5 mg total) by mouth at bedtime as needed for sleep.  30 tablet  0    BP 120/84  Pulse 84  Temp(Src) 98.1 F (36.7 C) (Oral)  Resp 18  Wt 195 lb 0.6 oz (88.47 kg)       Objective:   Physical Exam  Constitutional: She appears well-developed and well-nourished. No distress.  HENT:  Head: Normocephalic and atraumatic.  Mouth/Throat: No oropharyngeal exudate.  Eyes:  Conjunctivae are normal. Pupils are equal, round, and reactive to light. No scleral icterus.  Cardiovascular: Normal rate and regular rhythm.   No murmur heard. Pulmonary/Chest: Effort normal and breath sounds normal. No respiratory distress. She has no wheezes. She has no rales. She exhibits no tenderness.  Musculoskeletal: She exhibits no edema.  Skin: Skin is dry.  Psychiatric: She has a normal mood and affect. Her behavior is normal. Judgment and thought content normal.          Assessment & Plan:

## 2011-09-08 NOTE — Assessment & Plan Note (Signed)
Resolving, complete doxy.

## 2011-09-08 NOTE — Assessment & Plan Note (Signed)
Clinically stable. Obtain TSH, continue synthroid.  

## 2011-09-08 NOTE — Patient Instructions (Signed)
Please follow up in early May for Medicare Wellness- come fasting.

## 2011-09-08 NOTE — Assessment & Plan Note (Signed)
Improving.  She continues cortisporin.  Requesting refill on patanol.

## 2011-09-08 NOTE — Assessment & Plan Note (Signed)
BP looks good today.  Monitor.  

## 2011-09-09 ENCOUNTER — Encounter: Payer: Self-pay | Admitting: Family

## 2011-09-25 DIAGNOSIS — M25549 Pain in joints of unspecified hand: Secondary | ICD-10-CM | POA: Diagnosis not present

## 2011-09-25 DIAGNOSIS — M069 Rheumatoid arthritis, unspecified: Secondary | ICD-10-CM | POA: Diagnosis not present

## 2011-09-25 DIAGNOSIS — R6889 Other general symptoms and signs: Secondary | ICD-10-CM | POA: Diagnosis not present

## 2011-09-25 DIAGNOSIS — Z79899 Other long term (current) drug therapy: Secondary | ICD-10-CM | POA: Diagnosis not present

## 2011-09-27 ENCOUNTER — Other Ambulatory Visit: Payer: Self-pay | Admitting: Family

## 2011-09-29 ENCOUNTER — Telehealth: Payer: Self-pay | Admitting: *Deleted

## 2011-09-29 NOTE — Telephone Encounter (Signed)
Verified with pt that she will pick Rxs up at the [facility] pharmacy as refills were sent there in January.

## 2011-09-29 NOTE — Telephone Encounter (Signed)
Pt has been notified.

## 2011-09-29 NOTE — Telephone Encounter (Signed)
I would recommend that she see her eye doctor.  She should let us know if she has any trouble getting in with her eye doctor.

## 2011-09-29 NOTE — Telephone Encounter (Signed)
Received call from pt stating her eyes are still irritated. Neomycin did not help and Pataday only gives her temporary relief. Pt wants to know if you have any other suggestions or should she see her eye doctor at [facility]?

## 2011-10-01 DIAGNOSIS — H1045 Other chronic allergic conjunctivitis: Secondary | ICD-10-CM | POA: Diagnosis not present

## 2011-10-09 DIAGNOSIS — H1045 Other chronic allergic conjunctivitis: Secondary | ICD-10-CM | POA: Diagnosis not present

## 2011-10-15 ENCOUNTER — Encounter: Payer: Self-pay | Admitting: Family

## 2011-10-15 ENCOUNTER — Ambulatory Visit (INDEPENDENT_AMBULATORY_CARE_PROVIDER_SITE_OTHER): Payer: Medicare Other | Admitting: Family

## 2011-10-15 VITALS — BP 124/80 | HR 93 | Temp 98.0°F | Resp 18 | Wt 194.1 lb

## 2011-10-15 DIAGNOSIS — J45901 Unspecified asthma with (acute) exacerbation: Secondary | ICD-10-CM | POA: Diagnosis not present

## 2011-10-15 DIAGNOSIS — B370 Candidal stomatitis: Secondary | ICD-10-CM | POA: Diagnosis not present

## 2011-10-15 DIAGNOSIS — R197 Diarrhea, unspecified: Secondary | ICD-10-CM | POA: Diagnosis not present

## 2011-10-15 MED ORDER — AZITHROMYCIN 250 MG PO TABS: ORAL_TABLET | ORAL | Status: AC

## 2011-10-15 MED ORDER — FLUCONAZOLE 100 MG PO TABS: 100.0000 mg | ORAL_TABLET | Freq: Every day | ORAL | Status: AC

## 2011-10-15 NOTE — Assessment & Plan Note (Signed)
66 yr old female with asthma exacerbation/bronchitis. Will plan to treat with zpak and pred taper.  Plan follow up in 1 week.

## 2011-10-15 NOTE — Progress Notes (Signed)
  Subjective:    Patient ID: Sheryl [name], female    DOB: January 29, 1946, 66 y.o.   MRN: [id number]  HPI    Review of Systems     Objective:   Physical Exam        Assessment & Plan:  Addendum:  Lung exam should be documented as CTA bilaterally without wheezing.  Prednisone was not ordered- correction.

## 2011-10-15 NOTE — Assessment & Plan Note (Signed)
Due to complaint of pain with swallowing, I will assume that the  Candidiasis extends down the esophagus and plan to treat with diflucan.

## 2011-10-15 NOTE — Patient Instructions (Signed)
Please complete stool samples and return. Follow up in 1 week, call sooner if symptoms worsen.

## 2011-10-15 NOTE — Progress Notes (Signed)
Subjective:    Patient ID: Sheryl [name], female    DOB: 02-Oct-1945, 66 y.o.   MRN: [id number]  HPI  Ms.  Scharnhorst is a 66 yr old female who presents today with chief complaint of cough.  Cough is productive of yellow sputum. She reports + associated wheezing, and bilateral ear pain.  Notes that she has seen the eye doctor who is treating her eyes for redness and irritation with 2 different eye drops. She does not bring these with her today.   Notes chronic diarrhea- cramping, worse after she takes abx.  Up to 3 loose stools a day.  Notes that this has been happening for > 28yr.   Notes some thrush in mouth, and some discomfort with swallowing.  Review of Systems See HPI  Past Medical History  Diagnosis Date  . Hyperlipidemia   . Thyroid disease     hypothyroid  . Anemia     pernicious  . Asthma   . Diabetes mellitus     type 2  . Tobacco abuse   . Sjogren's syndrome 03/17/2011    History   Social History  . Marital Status: Single    Spouse Name: N/A    Number of Children: 1  . Years of Education: N/A   Occupational History  . unemployed    Social History Main Topics  . Smoking status: Current Some Day Smoker    Types: Cigarettes  . Smokeless tobacco: Not on file   Comment: 3 cigarettes a month. Uses smokeless cigarette.  . Alcohol Use: 0.6 oz/week    1 Glasses of wine per week  . Drug Use: Not on file  . Sexually Active: Not on file   Other Topics Concern  . Not on file   Social History Narrative   Regular exercise:  Stretching exercises. Resistance bandsCaffeine: 1 mug (2cus) daily.    Past Surgical History  Procedure Date  . Knee surgery 2006    arthroscopic left knee  . Knee surgery 2001    right knee  . Dilation and curettage of uterus 1975    Family History  Problem Relation Age of Onset  . Heart disease Other     CAD  . Diabetes Other   . Hyperlipidemia Other   . Stroke Other     Allergies  Allergen Reactions  . Cefuroxime  Axetil     REACTION: asthma, cough  . Levofloxacin   . Statins     Muscle pain, syncope    Current Outpatient Prescriptions on File Prior to Visit  Medication Sig Dispense Refill  . albuterol (VENTOLIN HFA) 108 (90 BASE) MCG/ACT inhaler Inhale 2 puffs into the lungs every 6 (six) hours as needed.        Marland Kitchen aspirin 81 MG tablet Take 81 mg by mouth daily.        . diclofenac (VOLTAREN) 75 MG EC tablet Take 75 mg by mouth 2 (two) times daily as needed.       . fluticasone (FLONASE) 50 MCG/ACT nasal spray 2 sprays by Each Nare route daily as needed.        . Fluticasone-Salmeterol (ADVAIR DISKUS) 250-50 MCG/DOSE AEPB Inhale 1 puff into the lungs 2 (two) times daily.  60 each  2  . Hydroxychloroquine Sulfate (PLAQUENIL PO) Take 200 mg by mouth 2 (two) times daily.       Marland Kitchen levothyroxine (SYNTHROID, LEVOTHROID) 100 MCG tablet Take 1 tablet (100 mcg total) by mouth daily.  30 tablet  2  . multivitamin (THERAGRAN) per tablet Take 1 tablet by mouth daily.        Marland Kitchen nystatin (MYCOSTATIN) cream Apply 1 application topically 2 (two) times daily. To affected area until healed       . Olopatadine HCl (PATADAY) 0.2 % SOLN Place 1 drop into both eyes 2 (two) times daily as needed.      . pantoprazole (PROTONIX) 40 MG tablet Take 1 tablet (40 mg total) by mouth daily.  30 tablet  2  . ZETIA 10 MG tablet take 1 tablet by mouth once daily  30 tablet  2  . zolpidem (AMBIEN) 5 MG tablet Take 1 tablet (5 mg total) by mouth at bedtime as needed for sleep.  30 tablet  0  . NEOMYCIN-POLYMYXIN-HC, OPHTH, SUSP 2 drops to each eye 4 times daily for 1 week.  7.5 mL  0  . Polyethyl Glycol-Propyl Glycol (SYSTANE) 0.4-0.3 % SOLN Apply 1 drop to eye. 2 to 3 times a day as needed.        BP 124/80  Pulse 93  Temp(Src) 98 F (36.7 C) (Oral)  Resp 18  Wt 194 lb 1.3 oz (88.034 kg)  SpO2 97%       Objective:   Physical Exam  Constitutional: She appears well-developed and well-nourished. No distress.  HENT:       +  oral thrush noted on tongue.  Cardiovascular: Normal rate and regular rhythm.   No murmur heard. Pulmonary/Chest: Effort normal.       Bilateral expiratory wheeze.   Musculoskeletal: She exhibits no edema.  Skin: Skin is warm and dry.  Psychiatric: She has a normal mood and affect. Her behavior is normal. Judgment and thought content normal.          Assessment & Plan:

## 2011-10-15 NOTE — Assessment & Plan Note (Signed)
Obtain stool studies.

## 2011-10-17 LAB — OVA AND PARASITE SCREEN: OP: NONE SEEN

## 2011-10-20 LAB — STOOL CULTURE

## 2011-10-22 DIAGNOSIS — H2510 Age-related nuclear cataract, unspecified eye: Secondary | ICD-10-CM | POA: Diagnosis not present

## 2011-10-22 DIAGNOSIS — Z79899 Other long term (current) drug therapy: Secondary | ICD-10-CM | POA: Diagnosis not present

## 2011-10-24 ENCOUNTER — Encounter: Payer: Self-pay | Admitting: Family

## 2011-10-24 ENCOUNTER — Ambulatory Visit (INDEPENDENT_AMBULATORY_CARE_PROVIDER_SITE_OTHER): Payer: Medicare Other | Admitting: Family

## 2011-10-24 DIAGNOSIS — B370 Candidal stomatitis: Secondary | ICD-10-CM | POA: Diagnosis not present

## 2011-10-24 DIAGNOSIS — E119 Type 2 diabetes mellitus without complications: Secondary | ICD-10-CM

## 2011-10-24 DIAGNOSIS — E039 Hypothyroidism, unspecified: Secondary | ICD-10-CM

## 2011-10-24 DIAGNOSIS — J45901 Unspecified asthma with (acute) exacerbation: Secondary | ICD-10-CM | POA: Diagnosis not present

## 2011-10-24 DIAGNOSIS — G4700 Insomnia, unspecified: Secondary | ICD-10-CM | POA: Diagnosis not present

## 2011-10-24 DIAGNOSIS — H109 Unspecified conjunctivitis: Secondary | ICD-10-CM

## 2011-10-24 MED ORDER — NYSTATIN 100000 UNIT/ML MT SUSP: OROMUCOSAL | Status: DC

## 2011-10-24 MED ORDER — ZOLPIDEM TARTRATE 5 MG PO TABS: 5.0000 mg | ORAL_TABLET | Freq: Every evening | ORAL | Status: DC | PRN

## 2011-10-24 NOTE — Assessment & Plan Note (Signed)
Resolved, notes that she gets intermittent symptoms of thrush that she attributes to use of advair. Will send rx for nystatin for her to have on hand.

## 2011-10-24 NOTE — Assessment & Plan Note (Signed)
She reports that she is only using ambien PRN.

## 2011-10-24 NOTE — Assessment & Plan Note (Signed)
Allergic conjuntivitis- stable on pataday.

## 2011-10-24 NOTE — Assessment & Plan Note (Signed)
TSH was normal last visit.

## 2011-10-24 NOTE — Progress Notes (Signed)
Subjective:    Patient ID: Sheryl [name], female    DOB: [birth year], 66 y.o.   MRN: [id number]  HPI  Bronchitis- Last visit was treated with z-pak and advair. Reports that she is feeling much better.  Diarrhea- Stool studies were negative last visit. Diarrhea is resolved.    Thrush-Treated with diflucan last visit.  Reports that her tongue is feeling better.   Eye watering- attributes this to allergies, now on opthalmic antihistamine per her eye doctor which she reports is helping.   Review of Systems See HPI  Past Medical History  Diagnosis Date  . Hyperlipidemia   . Thyroid disease     hypothyroid  . Anemia     pernicious  . Asthma   . Diabetes mellitus     type 2  . Tobacco abuse   . Sjogren's syndrome 03/17/2011    History   Social History  . Marital Status: Single    Spouse Name: N/A    Number of Children: 1  . Years of Education: N/A   Occupational History  . unemployed    Social History Main Topics  . Smoking status: Current Some Day Smoker    Types: Cigarettes  . Smokeless tobacco: Not on file   Comment: 3 cigarettes a month. Uses smokeless cigarette.  . Alcohol Use: 0.6 oz/week    1 Glasses of wine per week  . Drug Use: Not on file  . Sexually Active: Not on file   Other Topics Concern  . Not on file   Social History Narrative   Regular exercise:  Stretching exercises. Resistance bandsCaffeine: 1 mug (2cus) daily.    Past Surgical History  Procedure Date  . Knee surgery 2006    arthroscopic left knee  . Knee surgery 2001    right knee  . Dilation and curettage of uterus 1975    Family History  Problem Relation Age of Onset  . Heart disease Other     CAD  . Diabetes Other   . Hyperlipidemia Other   . Stroke Other     Allergies  Allergen Reactions  . Cefuroxime Axetil     REACTION: asthma, cough  . Levofloxacin   . Statins     Muscle pain, syncope    Current Outpatient Prescriptions on File Prior to Visit  Medication  Sig Dispense Refill  . albuterol (VENTOLIN HFA) 108 (90 BASE) MCG/ACT inhaler Inhale 2 puffs into the lungs every 6 (six) hours as needed.        Marland Kitchen aspirin 81 MG tablet Take 81 mg by mouth daily.        . diclofenac (VOLTAREN) 75 MG EC tablet Take 75 mg by mouth 2 (two) times daily as needed.       . fluticasone (FLONASE) 50 MCG/ACT nasal spray 2 sprays by Each Nare route daily as needed.        . Fluticasone-Salmeterol (ADVAIR DISKUS) 250-50 MCG/DOSE AEPB Inhale 1 puff into the lungs 2 (two) times daily.  60 each  2  . Hydroxychloroquine Sulfate (PLAQUENIL PO) Take 200 mg by mouth 2 (two) times daily.       Marland Kitchen levothyroxine (SYNTHROID, LEVOTHROID) 100 MCG tablet Take 1 tablet (100 mcg total) by mouth daily.  30 tablet  2  . multivitamin (THERAGRAN) per tablet Take 1 tablet by mouth daily.        . NEOMYCIN-POLYMYXIN-HC, OPHTH, SUSP 2 drops to each eye 4 times daily for 1 week.  7.5 mL  0  .  nystatin (MYCOSTATIN) cream Apply 1 application topically 2 (two) times daily. To affected area until healed       . Olopatadine HCl (PATADAY) 0.2 % SOLN Place 1 drop into both eyes 2 (two) times daily as needed.      . pantoprazole (PROTONIX) 40 MG tablet Take 1 tablet (40 mg total) by mouth daily.  30 tablet  2  . Polyethyl Glycol-Propyl Glycol (SYSTANE) 0.4-0.3 % SOLN Apply 1 drop to eye. 2 to 3 times a day as needed.      Marland Kitchen ZETIA 10 MG tablet take 1 tablet by mouth once daily  30 tablet  2    BP 118/76  Pulse 89  Temp(Src) 98.1 F (36.7 C) (Oral)  Resp 16  Wt 195 lb 0.6 oz (88.47 kg)  SpO2 99%       Objective:   Physical Exam  Constitutional: She appears well-developed and well-nourished.  HENT:  Head: Normocephalic and atraumatic.  Eyes: Conjunctivae are normal.  Cardiovascular: Normal rate and regular rhythm.   No murmur heard. Pulmonary/Chest: Effort normal and breath sounds normal. No respiratory distress. She has no wheezes. She has no rales. She exhibits no tenderness.    Musculoskeletal: She exhibits no edema.  Psychiatric: She has a normal mood and affect. Her behavior is normal. Judgment and thought content normal.          Assessment & Plan:

## 2011-10-24 NOTE — Patient Instructions (Signed)
Please follow up in 6 months, sooner if problems/concerns.  

## 2011-10-24 NOTE — Assessment & Plan Note (Signed)
Resolved. Continue Advair and PRN albuterol.

## 2011-12-01 ENCOUNTER — Other Ambulatory Visit: Payer: Self-pay | Admitting: Family

## 2011-12-01 NOTE — Telephone Encounter (Signed)
Zolpidem 5mg  #30 x no refills called to [facility] Nimrod at [facility].

## 2011-12-29 ENCOUNTER — Other Ambulatory Visit (HOSPITAL_COMMUNITY)
Admission: RE | Admit: 2011-12-29 | Discharge: 2011-12-29 | Disposition: A | Discharge status: Home / Self Care | Payer: Medicare Other | Source: Ambulatory Visit | Attending: Family | Admitting: Family

## 2011-12-29 ENCOUNTER — Ambulatory Visit (INDEPENDENT_AMBULATORY_CARE_PROVIDER_SITE_OTHER): Payer: Medicare Other | Admitting: Family

## 2011-12-29 ENCOUNTER — Other Ambulatory Visit: Payer: Self-pay | Admitting: Family

## 2011-12-29 ENCOUNTER — Ambulatory Visit (HOSPITAL_BASED_OUTPATIENT_CLINIC_OR_DEPARTMENT_OTHER)
Admission: RE | Admit: 2011-12-29 | Discharge: 2011-12-29 | Disposition: A | Discharge status: Home / Self Care | Payer: Medicare Other | Source: Ambulatory Visit | Attending: Family | Admitting: Family

## 2011-12-29 ENCOUNTER — Encounter: Payer: Self-pay | Admitting: Family

## 2011-12-29 DIAGNOSIS — Z124 Encounter for screening for malignant neoplasm of cervix: Secondary | ICD-10-CM | POA: Insufficient documentation

## 2011-12-29 DIAGNOSIS — K219 Gastro-esophageal reflux disease without esophagitis: Secondary | ICD-10-CM | POA: Diagnosis not present

## 2011-12-29 DIAGNOSIS — J45909 Unspecified asthma, uncomplicated: Secondary | ICD-10-CM

## 2011-12-29 DIAGNOSIS — E039 Hypothyroidism, unspecified: Secondary | ICD-10-CM | POA: Diagnosis not present

## 2011-12-29 DIAGNOSIS — E119 Type 2 diabetes mellitus without complications: Secondary | ICD-10-CM

## 2011-12-29 DIAGNOSIS — F17200 Nicotine dependence, unspecified, uncomplicated: Secondary | ICD-10-CM

## 2011-12-29 DIAGNOSIS — M069 Rheumatoid arthritis, unspecified: Secondary | ICD-10-CM

## 2011-12-29 DIAGNOSIS — R197 Diarrhea, unspecified: Secondary | ICD-10-CM | POA: Insufficient documentation

## 2011-12-29 DIAGNOSIS — R141 Gas pain: Secondary | ICD-10-CM | POA: Diagnosis not present

## 2011-12-29 DIAGNOSIS — R140 Abdominal distension (gaseous): Secondary | ICD-10-CM

## 2011-12-29 DIAGNOSIS — E785 Hyperlipidemia, unspecified: Secondary | ICD-10-CM

## 2011-12-29 DIAGNOSIS — R030 Elevated blood-pressure reading, without diagnosis of hypertension: Secondary | ICD-10-CM

## 2011-12-29 DIAGNOSIS — R142 Eructation: Secondary | ICD-10-CM

## 2011-12-29 DIAGNOSIS — Z79899 Other long term (current) drug therapy: Secondary | ICD-10-CM | POA: Diagnosis not present

## 2011-12-29 DIAGNOSIS — L408 Other psoriasis: Secondary | ICD-10-CM

## 2011-12-29 DIAGNOSIS — Z01419 Encounter for gynecological examination (general) (routine) without abnormal findings: Secondary | ICD-10-CM | POA: Diagnosis not present

## 2011-12-29 DIAGNOSIS — Z0000 Encounter for general adult medical examination without abnormal findings: Secondary | ICD-10-CM | POA: Diagnosis not present

## 2011-12-29 DIAGNOSIS — K8020 Calculus of gallbladder without cholecystitis without obstruction: Secondary | ICD-10-CM

## 2011-12-29 DIAGNOSIS — E559 Vitamin D deficiency, unspecified: Secondary | ICD-10-CM

## 2011-12-29 DIAGNOSIS — L409 Psoriasis, unspecified: Secondary | ICD-10-CM | POA: Insufficient documentation

## 2011-12-29 DIAGNOSIS — R143 Flatulence: Secondary | ICD-10-CM | POA: Diagnosis not present

## 2011-12-29 DIAGNOSIS — D510 Vitamin B12 deficiency anemia due to intrinsic factor deficiency: Secondary | ICD-10-CM | POA: Diagnosis not present

## 2011-12-29 DIAGNOSIS — R109 Unspecified abdominal pain: Secondary | ICD-10-CM | POA: Diagnosis not present

## 2011-12-29 DIAGNOSIS — M3500 Sicca syndrome, unspecified: Secondary | ICD-10-CM

## 2011-12-29 DIAGNOSIS — Z1211 Encounter for screening for malignant neoplasm of colon: Secondary | ICD-10-CM | POA: Diagnosis not present

## 2011-12-29 HISTORY — DX: Psoriasis, unspecified: L40.9

## 2011-12-29 LAB — LIPID PANEL
Cholesterol: 200 mg/dL (ref 0–200)
Triglycerides: 205 mg/dL — ABNORMAL HIGH (ref <150)

## 2011-12-29 LAB — BASIC METABOLIC PANEL WITH GFR
Calcium: 9.3 mg/dL (ref 8.4–10.5)
GFR, Est African American: 84 mL/min
Sodium: 137 mEq/L (ref 135–145)

## 2011-12-29 LAB — CBC WITH DIFFERENTIAL/PLATELET
Basophils Relative: 1 % (ref 0–1)
Eosinophils Absolute: 0.1 10*3/uL (ref 0.0–0.7)
HCT: 43.5 % (ref 36.0–46.0)
Hemoglobin: 14.3 g/dL (ref 12.0–15.0)
Lymphs Abs: 1.7 10*3/uL (ref 0.7–4.0)
MCH: 29.5 pg (ref 26.0–34.0)
MCHC: 32.9 g/dL (ref 30.0–36.0)
Monocytes Absolute: 0.3 10*3/uL (ref 0.1–1.0)
Monocytes Relative: 6 % (ref 3–12)
Neutro Abs: 3.2 10*3/uL (ref 1.7–7.7)
RBC: 4.85 MIL/uL (ref 3.87–5.11)

## 2011-12-29 LAB — HEPATIC FUNCTION PANEL
ALT: 27 U/L (ref 0–35)
AST: 31 U/L (ref 0–37)
Albumin: 4.1 g/dL (ref 3.5–5.2)
Total Protein: 7.7 g/dL (ref 6.0–8.3)

## 2011-12-29 MED ORDER — FLUTICASONE-SALMETEROL 250-50 MCG/DOSE IN AEPB: 1.0000 | INHALATION_SPRAY | Freq: Two times a day (BID) | RESPIRATORY_TRACT | Status: DC

## 2011-12-29 MED ORDER — PANTOPRAZOLE SODIUM 40 MG PO TBEC: 40.0000 mg | DELAYED_RELEASE_TABLET | Freq: Every day | ORAL | Status: DC

## 2011-12-29 MED ORDER — BETAMETHASONE DIPROPIONATE 0.05 % EX CREA: TOPICAL_CREAM | Freq: Two times a day (BID) | CUTANEOUS | Status: DC

## 2011-12-29 MED ORDER — LEVOTHYROXINE SODIUM 100 MCG PO TABS: 100.0000 ug | ORAL_TABLET | Freq: Every day | ORAL | Status: DC

## 2011-12-29 MED ORDER — DICLOFENAC SODIUM 75 MG PO TBEC: 75.0000 mg | DELAYED_RELEASE_TABLET | Freq: Two times a day (BID) | ORAL | Status: DC | PRN

## 2011-12-29 MED ORDER — FLUTICASONE PROPIONATE 50 MCG/ACT NA SUSP: 2.0000 | Freq: Every day | NASAL | Status: DC

## 2011-12-29 NOTE — Patient Instructions (Addendum)
Please schedule your ultrasound on the first floor.   Complete your stool study and return. Let us know if you would like to have the shingles shot. Complete your blood work prior to leaving.  Follow up in 3 months.

## 2011-12-29 NOTE — Assessment & Plan Note (Signed)
Clinically stable. Obtain A1C, urine microalbumin.

## 2011-12-29 NOTE — Assessment & Plan Note (Signed)
Occasional GERD symptoms on protonix. Relieved with tums.

## 2011-12-29 NOTE — Assessment & Plan Note (Signed)
Some diarrhea.  Will obtain abdominal US to evaluate gallbladder as well as LFTs.

## 2011-12-29 NOTE — Assessment & Plan Note (Signed)
Obtain TSH, continue synthroid.  

## 2011-12-29 NOTE — Assessment & Plan Note (Signed)
BP Readings from Last 3 Encounters:  12/29/11 118/80  10/24/11 118/76  10/15/11 124/80   BP looks good- not on medications.  Monitor.

## 2011-12-29 NOTE — Progress Notes (Signed)
Subjective:    Patient ID: Sheryl [name], female    DOB: 1945/10/01, 66 y.o.   MRN: [id number]  HPI  Subjective:   Patient here for Medicare annual wellness visit and management of other chronic and acute problems.  Hypothyroid- She reports + fatigue.  Continues synthroid.    DM2- She continues to watch her diet.    Hyperlipidemia- stopped zetia as it was causing GI upset- "runs."  She has been off since September of last year.   Pernicious anemia- She is on a b12 multivitamin complex.    Asthma/tobacco abuse-  Reports that asthma has been well controlled.  She reports that she smokes 1 pack  Month.   RA/sjogrens- Feel like the arthritis is "in remission."  Only one episode of hand swelling.  Occasional swelling/pain of the left knee. She is off of the plaquenil.    GERD- Reports that this has been bothering her.  Using tums prn.    Digestive issues-  Reports that she has had some diarrhea.  She develops bloating/gas.  She reports that she has switched to fish.  Cooked fruit/canned fruit.      Risk factors: At risk for lung cancer due to smoking.    Roster of Physicians Providing Medical Care to Patient: Dr. Corliss Skains- rheumatology  Activities of Daily Living  In your present state of health, do you have any difficulty performing the following activities? Preparing food and eating?: No  Bathing yourself: No  Getting dressed: No  Using the toilet:No  Moving around from place to place: No  In the past year have you fallen or had a near fall?:No    Home Safety: Has smoke detector and wears seat belts. No firearms. No excess sun exposure.  Diet and Exercise  Current exercise habits: Not exercising regularly.  Does yoga every day.   Dietary issues discussed:  tries to avoid concentrated sweets.  Eating high carb diet.    Depression Screen  (Note: if answer to either of the following is "Yes", then a more complete depression screening is indicated)  Q1: Over the  past two weeks, have you felt down, depressed or hopeless?no  Q2: Over the past two weeks, have you felt little interest or pleasure in doing things? no   The following portions of the patient's history were reviewed and updated as appropriate: allergies, current medications, past family history, past medical history, past social history, past surgical history and problem list.    Objective:   Vision:see nursing Hearing: able to hear forced whisper at 6 feet. Body mass index: see HPI Cognitive Impairment Assessment: cognition, memory and judgment appear normal.   Assessment:   Medicare wellness utd on preventive parameters   Plan:    During the course of the visit the patient was educated and counseled about appropriate screening and preventive services including:       Fall prevention   Screening mammography  Bone densitometry screening  Nutrition counseling  [place]- declines at this time.  Agreeable to IFOB. Vaccines / LABS  Pt will check with insurance re: zostavax coverage and will let us know if she would like to proceed.   Patient Instructions (the written plan) was given to the patient.       Review of Systems  Constitutional: Negative for unexpected weight change.  HENT: Negative for hearing loss.   Eyes: Negative for visual disturbance.  Respiratory:       Some shortness of breath with asthma symptoms  Cardiovascular: Negative for  leg swelling.  Gastrointestinal: Positive for diarrhea.  Genitourinary: Negative for dysuria and frequency.  Musculoskeletal: Positive for arthralgias.  Skin:       psoriasis  Neurological: Negative for headaches.  Hematological: Negative for adenopathy.  Psychiatric/Behavioral:       Denies depression       Objective:   Physical Exam  Skin:       Scaling skin noted bilateral palmar surfaces.    Physical Exam  Constitutional: She is oriented to person, place, and time. She appears well-developed and well-nourished. No  distress.  HENT:  Head: Normocephalic and atraumatic.  Right Ear: Tympanic membrane and ear canal normal.  Left Ear: Tympanic membrane and ear canal normal.  Mouth/Throat: Oropharynx is clear and moist.  Eyes: Pupils are equal, round, and reactive to light. No scleral icterus.  Neck: Normal range of motion. No thyromegaly present.  Cardiovascular: Normal rate and regular rhythm.   No murmur heard. Pulmonary/Chest: Effort normal and breath sounds normal. No respiratory distress. He has no wheezes. She has no rales. She exhibits no tenderness.  Abdominal: Soft. Bowel sounds are normal. He exhibits no distension and no mass. There is no tenderness. There is no rebound and no guarding.  Musculoskeletal: She exhibits slight swelling of the left knee.  No other significant joint swelling is noted. Lymphadenopathy:    She has no cervical adenopathy.  Neurological: She is alert and oriented to person, place, and time. She has normal reflexes. She exhibits normal muscle tone. Coordination normal.  Skin: Skin is warm and dry.  Psychiatric: She has a normal mood and affect. Her behavior is normal. Judgment and thought content normal.  Breasts: Examined lying Right: Without masses, retractions, discharge or axillary adenopathy.  Left: Without masses, retractions, discharge or axillary adenopathy.  Inguinal/mons: Normal without inguinal adenopathy  External genitalia: Normal  BUS/Urethra/Skene's glands: Normal  Bladder: Normal  Vagina: Normal  Cervix: Normal (pap performed) Uterus: normal in size, shape and contour. Midline and mobile  Adnexa/parametria:  Rt: Without masses or tenderness.  Lt: Without masses or tenderness.  Anus and perineum: Normal           Assessment & Plan:         Assessment & Plan:

## 2011-12-29 NOTE — Assessment & Plan Note (Signed)
+   fatigue.  On oral B12 supplement.  Obtain cbc and b12 level.

## 2011-12-29 NOTE — Assessment & Plan Note (Signed)
Clinically stable.  This is being managed by Dr. Corliss Skains.

## 2011-12-29 NOTE — Assessment & Plan Note (Signed)
Noted on palmar surfaces of hands.  Trial of betamethasone cream.

## 2011-12-29 NOTE — Assessment & Plan Note (Signed)
-   Obtain vitamin D level

## 2011-12-29 NOTE — Assessment & Plan Note (Signed)
>>  ASSESSMENT AND PLAN FOR HYPERLIPIDEMIA WRITTEN ON 12/29/2011 11:20 AM BY O'SULLIVAN, Arlo Buffone, NP  She is off of zetia due to gi side effects.  Will obtain statin.

## 2011-12-29 NOTE — Assessment & Plan Note (Signed)
Pt counseled on importance of complete smoking cessation.  

## 2011-12-29 NOTE — Assessment & Plan Note (Signed)
Clinically stable, managed by Dr. Corliss Skains.

## 2011-12-29 NOTE — Assessment & Plan Note (Signed)
Stable. On advair and prn albuterol.

## 2011-12-29 NOTE — Assessment & Plan Note (Signed)
She is off of zetia due to gi side effects.  Will obtain statin.

## 2011-12-30 ENCOUNTER — Telehealth: Payer: Self-pay | Admitting: Family

## 2011-12-30 DIAGNOSIS — R140 Abdominal distension (gaseous): Secondary | ICD-10-CM

## 2011-12-30 LAB — VITAMIN D 25 HYDROXY (VIT D DEFICIENCY, FRACTURES): Vit D, 25-Hydroxy: 52 ng/mL (ref 30–89)

## 2011-12-30 LAB — MICROALBUMIN / CREATININE URINE RATIO
Creatinine, Urine: 147.7 mg/dL
Microalb Creat Ratio: 6.1 mg/g (ref 0.0–30.0)
Microalb, Ur: 0.9 mg/dL (ref 0.00–1.89)

## 2011-12-30 NOTE — Telephone Encounter (Signed)
Reviewed [place] results and lab testing with pt.  Small gallstones on [place], LFT's normal.  I question gastroparesis or IBS as part of her symptoms more so than gallbladder disease.  Will refer to GI for further evaluation. Pt is agreeable.

## 2012-01-02 ENCOUNTER — Encounter: Payer: Self-pay | Admitting: Family

## 2012-01-09 ENCOUNTER — Telehealth: Payer: Self-pay | Admitting: Family

## 2012-01-09 ENCOUNTER — Other Ambulatory Visit: Payer: Medicare Other

## 2012-01-09 DIAGNOSIS — R195 Other fecal abnormalities: Secondary | ICD-10-CM

## 2012-01-09 NOTE — Telephone Encounter (Signed)
Left message on home phone requesting that pt return call. Reviewed IFOB- heme +.  Pt should have colonoscopy.  I will refer to GI. (pended below).

## 2012-01-13 NOTE — Telephone Encounter (Signed)
Left message on home # to return my call. 

## 2012-01-14 ENCOUNTER — Telehealth: Payer: Self-pay | Admitting: Family

## 2012-01-14 NOTE — Telephone Encounter (Signed)
Opened in error

## 2012-01-14 NOTE — Telephone Encounter (Signed)
Could you pls fax copy of ifob results to Dr. Conley Rolls at [facility]?

## 2012-01-14 NOTE — Telephone Encounter (Signed)
Pt returned my call and was notified of below result. Pt states she had already scheduled a GI consult with [facility] for 02/02/12 due to her "digestive problems".  States that she has been trying to eat only foods that are easy to digest, avoiding fresh fruits and vegetables. Noted "a bad episode" while travelling recently after eating an egg roll.  Please advise re: current GI appt date.

## 2012-01-14 NOTE — Telephone Encounter (Signed)
IFOB and labs faxed to [phone]. Notified pt.

## 2012-01-28 ENCOUNTER — Other Ambulatory Visit: Payer: Self-pay | Admitting: Family

## 2012-01-28 NOTE — Telephone Encounter (Signed)
Yes ok to refill- #30 tabs no additional refills.

## 2012-01-29 NOTE — Telephone Encounter (Signed)
Refill approval left on pharmacy provider voice mail.

## 2012-02-03 DIAGNOSIS — R198 Other specified symptoms and signs involving the digestive system and abdomen: Secondary | ICD-10-CM | POA: Diagnosis not present

## 2012-02-03 DIAGNOSIS — Z1211 Encounter for screening for malignant neoplasm of colon: Secondary | ICD-10-CM | POA: Diagnosis not present

## 2012-02-03 DIAGNOSIS — K921 Melena: Secondary | ICD-10-CM | POA: Diagnosis not present

## 2012-02-03 DIAGNOSIS — K5900 Constipation, unspecified: Secondary | ICD-10-CM | POA: Diagnosis not present

## 2012-03-03 ENCOUNTER — Telehealth: Payer: Self-pay | Admitting: Family

## 2012-03-03 MED ORDER — ZOLPIDEM TARTRATE 5 MG PO TABS: 5.0000 mg | ORAL_TABLET | Freq: Every evening | ORAL | Status: DC | PRN

## 2012-03-03 NOTE — Telephone Encounter (Signed)
Refill- zolpidem tartrate 5mg  tab. Take one tablet by mouth at bedtime as needed for sleep. Qty 30 last fill 6.6.13

## 2012-03-03 NOTE — Telephone Encounter (Signed)
Refill called to [facility] at pharmacy.

## 2012-03-11 DIAGNOSIS — M069 Rheumatoid arthritis, unspecified: Secondary | ICD-10-CM | POA: Diagnosis not present

## 2012-03-11 DIAGNOSIS — R6889 Other general symptoms and signs: Secondary | ICD-10-CM | POA: Diagnosis not present

## 2012-03-11 DIAGNOSIS — M25569 Pain in unspecified knee: Secondary | ICD-10-CM | POA: Diagnosis not present

## 2012-03-11 DIAGNOSIS — Z79899 Other long term (current) drug therapy: Secondary | ICD-10-CM | POA: Diagnosis not present

## 2012-03-30 DIAGNOSIS — Z1212 Encounter for screening for malignant neoplasm of rectum: Secondary | ICD-10-CM | POA: Diagnosis not present

## 2012-03-30 DIAGNOSIS — D129 Benign neoplasm of anus and anal canal: Secondary | ICD-10-CM | POA: Diagnosis not present

## 2012-03-30 DIAGNOSIS — K5900 Constipation, unspecified: Secondary | ICD-10-CM | POA: Diagnosis not present

## 2012-03-30 DIAGNOSIS — K620 Anal polyp: Secondary | ICD-10-CM | POA: Diagnosis not present

## 2012-03-30 DIAGNOSIS — D128 Benign neoplasm of rectum: Secondary | ICD-10-CM | POA: Diagnosis not present

## 2012-03-30 DIAGNOSIS — C187 Malignant neoplasm of sigmoid colon: Secondary | ICD-10-CM | POA: Diagnosis not present

## 2012-03-30 DIAGNOSIS — K621 Rectal polyp: Secondary | ICD-10-CM | POA: Diagnosis not present

## 2012-03-31 ENCOUNTER — Ambulatory Visit: Payer: Medicare Other | Admitting: Family

## 2012-03-31 DIAGNOSIS — K921 Melena: Secondary | ICD-10-CM | POA: Diagnosis not present

## 2012-03-31 DIAGNOSIS — R198 Other specified symptoms and signs involving the digestive system and abdomen: Secondary | ICD-10-CM | POA: Diagnosis not present

## 2012-03-31 DIAGNOSIS — C187 Malignant neoplasm of sigmoid colon: Secondary | ICD-10-CM | POA: Diagnosis not present

## 2012-04-01 ENCOUNTER — Telehealth: Payer: Self-pay | Admitting: Family

## 2012-04-01 MED ORDER — ZOLPIDEM TARTRATE 5 MG PO TABS: 5.0000 mg | ORAL_TABLET | Freq: Every evening | ORAL | Status: DC | PRN

## 2012-04-01 NOTE — Telephone Encounter (Signed)
Refill- zolpidem tartrate 5mg  tab. Take one tablet by mouth at bedtime as needed for sleep. Qty 30 last fill 7.10.13

## 2012-04-01 NOTE — Telephone Encounter (Signed)
Refill called to [facility], #30 x no refills.

## 2012-04-02 DIAGNOSIS — R599 Enlarged lymph nodes, unspecified: Secondary | ICD-10-CM | POA: Diagnosis not present

## 2012-04-02 DIAGNOSIS — C189 Malignant neoplasm of colon, unspecified: Secondary | ICD-10-CM | POA: Diagnosis not present

## 2012-04-02 DIAGNOSIS — M47817 Spondylosis without myelopathy or radiculopathy, lumbosacral region: Secondary | ICD-10-CM | POA: Diagnosis not present

## 2012-04-02 DIAGNOSIS — K7689 Other specified diseases of liver: Secondary | ICD-10-CM | POA: Diagnosis not present

## 2012-04-02 LAB — HM COLONOSCOPY

## 2012-04-05 ENCOUNTER — Ambulatory Visit: Payer: Medicare Other | Admitting: Family

## 2012-04-06 DIAGNOSIS — Z0389 Encounter for observation for other suspected diseases and conditions ruled out: Secondary | ICD-10-CM | POA: Diagnosis not present

## 2012-04-06 DIAGNOSIS — C189 Malignant neoplasm of colon, unspecified: Secondary | ICD-10-CM | POA: Diagnosis not present

## 2012-04-06 DIAGNOSIS — C187 Malignant neoplasm of sigmoid colon: Secondary | ICD-10-CM | POA: Diagnosis not present

## 2012-04-07 DIAGNOSIS — C187 Malignant neoplasm of sigmoid colon: Secondary | ICD-10-CM | POA: Diagnosis not present

## 2012-04-09 DIAGNOSIS — C189 Malignant neoplasm of colon, unspecified: Secondary | ICD-10-CM | POA: Diagnosis not present

## 2012-04-09 DIAGNOSIS — M47814 Spondylosis without myelopathy or radiculopathy, thoracic region: Secondary | ICD-10-CM | POA: Diagnosis not present

## 2012-04-09 DIAGNOSIS — IMO0002 Reserved for concepts with insufficient information to code with codable children: Secondary | ICD-10-CM | POA: Diagnosis not present

## 2012-04-09 DIAGNOSIS — C187 Malignant neoplasm of sigmoid colon: Secondary | ICD-10-CM | POA: Diagnosis not present

## 2012-04-09 DIAGNOSIS — D1809 Hemangioma of other sites: Secondary | ICD-10-CM | POA: Diagnosis not present

## 2012-04-12 DIAGNOSIS — C187 Malignant neoplasm of sigmoid colon: Secondary | ICD-10-CM | POA: Diagnosis not present

## 2012-04-13 ENCOUNTER — Other Ambulatory Visit: Payer: Self-pay | Admitting: *Deleted

## 2012-04-13 DIAGNOSIS — K3580 Unspecified acute appendicitis: Secondary | ICD-10-CM | POA: Diagnosis not present

## 2012-04-13 DIAGNOSIS — Z79899 Other long term (current) drug therapy: Secondary | ICD-10-CM | POA: Diagnosis not present

## 2012-04-13 DIAGNOSIS — D126 Benign neoplasm of colon, unspecified: Secondary | ICD-10-CM | POA: Diagnosis not present

## 2012-04-13 DIAGNOSIS — E039 Hypothyroidism, unspecified: Secondary | ICD-10-CM | POA: Diagnosis not present

## 2012-04-13 DIAGNOSIS — M47814 Spondylosis without myelopathy or radiculopathy, thoracic region: Secondary | ICD-10-CM | POA: Diagnosis present

## 2012-04-13 DIAGNOSIS — K219 Gastro-esophageal reflux disease without esophagitis: Secondary | ICD-10-CM | POA: Diagnosis not present

## 2012-04-13 DIAGNOSIS — C187 Malignant neoplasm of sigmoid colon: Secondary | ICD-10-CM | POA: Diagnosis not present

## 2012-04-13 DIAGNOSIS — E119 Type 2 diabetes mellitus without complications: Secondary | ICD-10-CM | POA: Diagnosis not present

## 2012-04-13 DIAGNOSIS — E785 Hyperlipidemia, unspecified: Secondary | ICD-10-CM | POA: Diagnosis not present

## 2012-04-13 DIAGNOSIS — C189 Malignant neoplasm of colon, unspecified: Secondary | ICD-10-CM | POA: Diagnosis not present

## 2012-04-13 DIAGNOSIS — D1809 Hemangioma of other sites: Secondary | ICD-10-CM | POA: Diagnosis present

## 2012-04-13 MED ORDER — ALBUTEROL SULFATE HFA 108 (90 BASE) MCG/ACT IN AERS: 2.0000 | INHALATION_SPRAY | Freq: Four times a day (QID) | RESPIRATORY_TRACT | Status: DC | PRN

## 2012-04-13 NOTE — Telephone Encounter (Signed)
Rx done/SLS 

## 2012-04-28 DIAGNOSIS — C187 Malignant neoplasm of sigmoid colon: Secondary | ICD-10-CM | POA: Diagnosis not present

## 2012-05-06 ENCOUNTER — Other Ambulatory Visit: Payer: Self-pay | Admitting: Family

## 2012-05-26 ENCOUNTER — Ambulatory Visit: Payer: Medicare Other | Admitting: Internal Medicine

## 2012-05-26 ENCOUNTER — Ambulatory Visit (INDEPENDENT_AMBULATORY_CARE_PROVIDER_SITE_OTHER): Payer: Medicare Other | Admitting: Family

## 2012-05-26 ENCOUNTER — Other Ambulatory Visit: Payer: Self-pay | Admitting: Family

## 2012-05-26 ENCOUNTER — Encounter: Payer: Self-pay | Admitting: Family

## 2012-05-26 ENCOUNTER — Ambulatory Visit (HOSPITAL_BASED_OUTPATIENT_CLINIC_OR_DEPARTMENT_OTHER)
Admission: RE | Admit: 2012-05-26 | Discharge: 2012-05-26 | Disposition: A | Discharge status: Home / Self Care | Payer: Medicare Other | Source: Ambulatory Visit | Attending: Family | Admitting: Family

## 2012-05-26 VITALS — BP 120/88 | HR 77 | Temp 98.0°F | Resp 16 | Ht 62.75 in | Wt 176.0 lb

## 2012-05-26 DIAGNOSIS — Z1231 Encounter for screening mammogram for malignant neoplasm of breast: Secondary | ICD-10-CM

## 2012-05-26 DIAGNOSIS — C189 Malignant neoplasm of colon, unspecified: Secondary | ICD-10-CM

## 2012-05-26 DIAGNOSIS — E119 Type 2 diabetes mellitus without complications: Secondary | ICD-10-CM | POA: Diagnosis not present

## 2012-05-26 DIAGNOSIS — Z1239 Encounter for other screening for malignant neoplasm of breast: Secondary | ICD-10-CM

## 2012-05-26 DIAGNOSIS — J45909 Unspecified asthma, uncomplicated: Secondary | ICD-10-CM

## 2012-05-26 LAB — BASIC METABOLIC PANEL
Calcium: 9.3 mg/dL (ref 8.4–10.5)
Chloride: 102 mEq/L (ref 96–112)
Creat: 0.78 mg/dL (ref 0.50–1.10)

## 2012-05-26 NOTE — Assessment & Plan Note (Signed)
Clinically stable.  Obtain A1C.  

## 2012-05-26 NOTE — Assessment & Plan Note (Signed)
S/P colectomy.  Will request records from [facility] and [facility].

## 2012-05-26 NOTE — Progress Notes (Signed)
Subjective:    Patient ID: Sheryl [name], female    DOB: 1946-05-26, 66 y.o.   MRN: [id number]  HPI  [name] is a 66 yr old female who presents today for follow up.  Colon mass- She was last seen in may for medicare wellness visit and declined colo. She did proceed with IFOB testing which revealed heme + stool. She was referred to GI at [facility] and found to have a colon mass.  She reports that she was told that the mass was cancerous and she had a colectomy about 5 weeks ago. Her recovery has been unremarkable.  Was told that it was a stage 2 colon cancer.  Reports removal of 17 lymph nodes- reportedly neg.  Reports that she lost weight during August due to liquid diet.    RA- reports that when she was on a liquid diet RA was better.  Reports that she is trying to avoid sugar and gluten.  DM2-Reports that this has been well controlled. She is watching her diet.  Asthma- reports that she quit smoking.  She continues advair and notes that her symptoms have improved since she stopped smoking.   Review of Systems See HPI  Past Medical History  Diagnosis Date  . Hyperlipidemia   . Thyroid disease     hypothyroid  . Anemia     pernicious  . Asthma   . Diabetes mellitus     type 2  . Tobacco abuse   . Sjogren's syndrome 03/17/2011    History   Social History  . Marital Status: Single    Spouse Name: N/A    Number of Children: 1  . Years of Education: N/A   Occupational History  . unemployed    Social History Main Topics  . Smoking status: Former Smoker    Types: Cigarettes  . Smokeless tobacco: Not on file   Comment: 3 cigarettes a month. Uses smokeless cigarette.  . Alcohol Use: 0.6 oz/week    1 Glasses of wine per week  . Drug Use: Not on file  . Sexually Active: Not on file   Other Topics Concern  . Not on file   Social History Narrative   Regular exercise:  Stretching exercises. Resistance bandsCaffeine: 1 mug (2cus) daily.    Past Surgical  History  Procedure Date  . Knee surgery 2006    arthroscopic left knee  . Knee surgery 2001    right knee  . Dilation and curettage of uterus 1975  . Colon surgery 8/.23/13    tumor removed from sigmoid colon.    Family History  Problem Relation Age of Onset  . Heart disease Other     CAD  . Diabetes Other   . Hyperlipidemia Other   . Stroke Other     Allergies  Allergen Reactions  . Cefuroxime Axetil     REACTION: asthma, cough  . Levofloxacin   . Statins     Muscle pain, syncope    Current Outpatient Prescriptions on File Prior to Visit  Medication Sig Dispense Refill  . albuterol (VENTOLIN HFA) 108 (90 BASE) MCG/ACT inhaler Inhale 2 puffs into the lungs every 6 (six) hours as needed.  1 Inhaler  5  . aspirin 81 MG tablet Take 81 mg by mouth daily.        . Fluticasone-Salmeterol (ADVAIR DISKUS) 250-50 MCG/DOSE AEPB Inhale 1 puff into the lungs 2 (two) times daily.  60 each  5  . levothyroxine (SYNTHROID, LEVOTHROID) 100  MCG tablet Take 1 tablet (100 mcg total) by mouth daily.  30 tablet  5  . multivitamin (THERAGRAN) per tablet Take 1 tablet by mouth daily.        . pantoprazole (PROTONIX) 40 MG tablet Take 1 tablet (40 mg total) by mouth daily.  30 tablet  5    BP 120/88  Pulse 77  Temp 98 F (36.7 C) (Oral)  Resp 16  Ht 5' 2.75" (1.594 m)  Wt 176 lb (79.833 kg)  BMI 31.43 kg/m2  SpO2 99%       Objective:   Physical Exam  Constitutional: She is oriented to person, place, and time. She appears well-developed and well-nourished. No distress.  Cardiovascular: Normal rate and regular rhythm.   No murmur heard. Pulmonary/Chest: Effort normal and breath sounds normal. No respiratory distress. She has no wheezes. She has no rales. She exhibits no tenderness.  Abdominal: Soft. She exhibits no distension.       Midline abdominal scar appears to be well healed.  Musculoskeletal: She exhibits no edema.  Neurological: She is alert and oriented to person, place,  and time.  Skin: Skin is warm and dry.  Psychiatric: She has a normal mood and affect. Her behavior is normal. Judgment and thought content normal.          Assessment & Plan:

## 2012-05-26 NOTE — Assessment & Plan Note (Signed)
Improved. I commended her for quitting smoking.  Continue advair.

## 2012-05-26 NOTE — Patient Instructions (Addendum)
Please complete your blood work prior to leaving. Schedule your mammogram on the first floor. Please schedule a follow up appointment in 3 months.

## 2012-05-27 ENCOUNTER — Encounter: Payer: Self-pay | Admitting: Family

## 2012-05-27 LAB — HEMOGLOBIN A1C
Hgb A1c MFr Bld: 5.9 % — ABNORMAL HIGH (ref <5.7)
Mean Plasma Glucose: 123 mg/dL — ABNORMAL HIGH (ref <117)

## 2012-06-02 ENCOUNTER — Telehealth: Payer: Self-pay | Admitting: Family

## 2012-06-02 NOTE — Telephone Encounter (Signed)
Received medical records from [facility] Hematology/Oncology-Dr. Joice Lofts: [phone] F: [phone]

## 2012-06-02 NOTE — Telephone Encounter (Signed)
Received medical records from [facility]- Dr. Racheal Patches: [phone] F: [phone]

## 2012-06-03 ENCOUNTER — Other Ambulatory Visit: Payer: Self-pay | Admitting: Family

## 2012-06-04 NOTE — Telephone Encounter (Signed)
Verified with pt that she still takes [place] as needed and is requesting a refill. Ambien refill left on pharmacy voicemail, #30 x no refills.

## 2012-06-17 ENCOUNTER — Telehealth: Payer: Self-pay | Admitting: Family

## 2012-06-17 NOTE — Telephone Encounter (Signed)
Received medical records from [facility]-Dr. Dayna Ramus: [phone]: [phone]

## 2012-07-02 ENCOUNTER — Other Ambulatory Visit: Payer: Self-pay | Admitting: Family

## 2012-07-02 NOTE — Telephone Encounter (Signed)
Please advise re: Nystatin refill request.

## 2012-07-05 NOTE — Telephone Encounter (Signed)
rx sent to [facility]  

## 2012-07-10 ENCOUNTER — Encounter (HOSPITAL_BASED_OUTPATIENT_CLINIC_OR_DEPARTMENT_OTHER): Payer: Self-pay

## 2012-07-10 ENCOUNTER — Emergency Department (HOSPITAL_BASED_OUTPATIENT_CLINIC_OR_DEPARTMENT_OTHER)
Admission: EM | Admit: 2012-07-10 | Discharge: 2012-07-11 | Disposition: A | Discharge status: Other Acute Inpatient Hospital | Payer: Medicare Other | Attending: Emergency Medicine | Admitting: Emergency Medicine

## 2012-07-10 DIAGNOSIS — E785 Hyperlipidemia, unspecified: Secondary | ICD-10-CM | POA: Insufficient documentation

## 2012-07-10 DIAGNOSIS — Z79899 Other long term (current) drug therapy: Secondary | ICD-10-CM | POA: Insufficient documentation

## 2012-07-10 DIAGNOSIS — J45909 Unspecified asthma, uncomplicated: Secondary | ICD-10-CM | POA: Insufficient documentation

## 2012-07-10 DIAGNOSIS — I214 Non-ST elevation (NSTEMI) myocardial infarction: Secondary | ICD-10-CM | POA: Diagnosis not present

## 2012-07-10 DIAGNOSIS — J438 Other emphysema: Secondary | ICD-10-CM | POA: Diagnosis not present

## 2012-07-10 DIAGNOSIS — R52 Pain, unspecified: Secondary | ICD-10-CM | POA: Insufficient documentation

## 2012-07-10 DIAGNOSIS — R079 Chest pain, unspecified: Secondary | ICD-10-CM | POA: Diagnosis not present

## 2012-07-10 DIAGNOSIS — I2109 ST elevation (STEMI) myocardial infarction involving other coronary artery of anterior wall: Secondary | ICD-10-CM | POA: Insufficient documentation

## 2012-07-10 DIAGNOSIS — E119 Type 2 diabetes mellitus without complications: Secondary | ICD-10-CM | POA: Diagnosis not present

## 2012-07-10 DIAGNOSIS — M3500 Sicca syndrome, unspecified: Secondary | ICD-10-CM | POA: Insufficient documentation

## 2012-07-10 DIAGNOSIS — I959 Hypotension, unspecified: Secondary | ICD-10-CM | POA: Diagnosis not present

## 2012-07-10 DIAGNOSIS — Z862 Personal history of diseases of the blood and blood-forming organs and certain disorders involving the immune mechanism: Secondary | ICD-10-CM | POA: Insufficient documentation

## 2012-07-10 DIAGNOSIS — M542 Cervicalgia: Secondary | ICD-10-CM | POA: Diagnosis not present

## 2012-07-10 DIAGNOSIS — Z7982 Long term (current) use of aspirin: Secondary | ICD-10-CM | POA: Insufficient documentation

## 2012-07-10 DIAGNOSIS — Z87891 Personal history of nicotine dependence: Secondary | ICD-10-CM | POA: Insufficient documentation

## 2012-07-10 DIAGNOSIS — R918 Other nonspecific abnormal finding of lung field: Secondary | ICD-10-CM | POA: Diagnosis not present

## 2012-07-10 DIAGNOSIS — E079 Disorder of thyroid, unspecified: Secondary | ICD-10-CM | POA: Diagnosis not present

## 2012-07-10 HISTORY — DX: Rheumatoid arthritis, unspecified: M06.9

## 2012-07-10 HISTORY — DX: Malignant neoplasm of colon, unspecified: C18.9

## 2012-07-10 MED ORDER — ONDANSETRON HCL 4 MG/2ML IJ SOLN
4.0000 mg | Freq: Once | INTRAMUSCULAR | Status: AC
  Administered 2012-07-11: 4 mg via INTRAVENOUS
  Filled 2012-07-10: qty 2

## 2012-07-10 NOTE — ED Notes (Signed)
Patient reports that she developed generalized bodyaches on Thursday with nausea and vomiting x 2 days, reports pain more now in shoulders and left side of neck, reports dental surgery 2 weeks ago. Reports that it hurts her to take deep breath

## 2012-07-10 NOTE — ED Notes (Signed)
MD at bedside. 

## 2012-07-11 ENCOUNTER — Other Ambulatory Visit: Payer: Self-pay

## 2012-07-11 ENCOUNTER — Emergency Department (HOSPITAL_BASED_OUTPATIENT_CLINIC_OR_DEPARTMENT_OTHER): Payer: Medicare Other

## 2012-07-11 ENCOUNTER — Encounter (HOSPITAL_BASED_OUTPATIENT_CLINIC_OR_DEPARTMENT_OTHER): Payer: Self-pay | Admitting: *Deleted

## 2012-07-11 DIAGNOSIS — J438 Other emphysema: Secondary | ICD-10-CM | POA: Diagnosis not present

## 2012-07-11 DIAGNOSIS — I300 Acute nonspecific idiopathic pericarditis: Secondary | ICD-10-CM | POA: Diagnosis not present

## 2012-07-11 DIAGNOSIS — M3500 Sicca syndrome, unspecified: Secondary | ICD-10-CM | POA: Diagnosis not present

## 2012-07-11 DIAGNOSIS — I309 Acute pericarditis, unspecified: Secondary | ICD-10-CM | POA: Diagnosis not present

## 2012-07-11 DIAGNOSIS — Z87891 Personal history of nicotine dependence: Secondary | ICD-10-CM | POA: Diagnosis not present

## 2012-07-11 DIAGNOSIS — R509 Fever, unspecified: Secondary | ICD-10-CM | POA: Diagnosis not present

## 2012-07-11 DIAGNOSIS — J811 Chronic pulmonary edema: Secondary | ICD-10-CM | POA: Diagnosis not present

## 2012-07-11 DIAGNOSIS — E119 Type 2 diabetes mellitus without complications: Secondary | ICD-10-CM | POA: Diagnosis not present

## 2012-07-11 DIAGNOSIS — J90 Pleural effusion, not elsewhere classified: Secondary | ICD-10-CM | POA: Diagnosis not present

## 2012-07-11 DIAGNOSIS — Z85038 Personal history of other malignant neoplasm of large intestine: Secondary | ICD-10-CM | POA: Diagnosis not present

## 2012-07-11 DIAGNOSIS — Z7982 Long term (current) use of aspirin: Secondary | ICD-10-CM | POA: Diagnosis not present

## 2012-07-11 DIAGNOSIS — Z79899 Other long term (current) drug therapy: Secondary | ICD-10-CM | POA: Diagnosis not present

## 2012-07-11 DIAGNOSIS — I959 Hypotension, unspecified: Secondary | ICD-10-CM | POA: Diagnosis not present

## 2012-07-11 DIAGNOSIS — M542 Cervicalgia: Secondary | ICD-10-CM | POA: Diagnosis not present

## 2012-07-11 DIAGNOSIS — I270 Primary pulmonary hypertension: Secondary | ICD-10-CM | POA: Diagnosis not present

## 2012-07-11 DIAGNOSIS — I5021 Acute systolic (congestive) heart failure: Secondary | ICD-10-CM | POA: Diagnosis not present

## 2012-07-11 DIAGNOSIS — I501 Left ventricular failure: Secondary | ICD-10-CM | POA: Diagnosis not present

## 2012-07-11 DIAGNOSIS — J45909 Unspecified asthma, uncomplicated: Secondary | ICD-10-CM | POA: Diagnosis not present

## 2012-07-11 DIAGNOSIS — R0982 Postnasal drip: Secondary | ICD-10-CM | POA: Diagnosis not present

## 2012-07-11 DIAGNOSIS — Z862 Personal history of diseases of the blood and blood-forming organs and certain disorders involving the immune mechanism: Secondary | ICD-10-CM | POA: Diagnosis not present

## 2012-07-11 DIAGNOSIS — R079 Chest pain, unspecified: Secondary | ICD-10-CM | POA: Diagnosis not present

## 2012-07-11 DIAGNOSIS — E039 Hypothyroidism, unspecified: Secondary | ICD-10-CM | POA: Diagnosis not present

## 2012-07-11 DIAGNOSIS — I369 Nonrheumatic tricuspid valve disorder, unspecified: Secondary | ICD-10-CM | POA: Diagnosis not present

## 2012-07-11 DIAGNOSIS — R5381 Other malaise: Secondary | ICD-10-CM | POA: Diagnosis not present

## 2012-07-11 DIAGNOSIS — I517 Cardiomegaly: Secondary | ICD-10-CM | POA: Diagnosis not present

## 2012-07-11 DIAGNOSIS — E079 Disorder of thyroid, unspecified: Secondary | ICD-10-CM | POA: Diagnosis not present

## 2012-07-11 DIAGNOSIS — M069 Rheumatoid arthritis, unspecified: Secondary | ICD-10-CM | POA: Diagnosis not present

## 2012-07-11 DIAGNOSIS — R918 Other nonspecific abnormal finding of lung field: Secondary | ICD-10-CM | POA: Diagnosis not present

## 2012-07-11 DIAGNOSIS — R52 Pain, unspecified: Secondary | ICD-10-CM | POA: Diagnosis not present

## 2012-07-11 DIAGNOSIS — I2109 ST elevation (STEMI) myocardial infarction involving other coronary artery of anterior wall: Secondary | ICD-10-CM | POA: Diagnosis not present

## 2012-07-11 DIAGNOSIS — R072 Precordial pain: Secondary | ICD-10-CM | POA: Diagnosis not present

## 2012-07-11 DIAGNOSIS — E785 Hyperlipidemia, unspecified: Secondary | ICD-10-CM | POA: Diagnosis not present

## 2012-07-11 LAB — CBC WITH DIFFERENTIAL/PLATELET
Eosinophils Relative: 0 % (ref 0–5)
HCT: 35.9 % — ABNORMAL LOW (ref 36.0–46.0)
Lymphocytes Relative: 12 % (ref 12–46)
Lymphs Abs: 1.8 10*3/uL (ref 0.7–4.0)
MCH: 29.1 pg (ref 26.0–34.0)
MCV: 83.7 fL (ref 78.0–100.0)
Monocytes Absolute: 1 10*3/uL (ref 0.1–1.0)
RBC: 4.29 MIL/uL (ref 3.87–5.11)
WBC: 15 10*3/uL — ABNORMAL HIGH (ref 4.0–10.5)

## 2012-07-11 LAB — COMPREHENSIVE METABOLIC PANEL
BUN: 11 mg/dL (ref 6–23)
CO2: 25 mEq/L (ref 19–32)
Calcium: 8.7 mg/dL (ref 8.4–10.5)
Creatinine, Ser: 0.8 mg/dL (ref 0.50–1.10)
GFR calc Af Amer: 88 mL/min — ABNORMAL LOW (ref >90)
GFR calc non Af Amer: 76 mL/min — ABNORMAL LOW (ref >90)
Glucose, Bld: 131 mg/dL — ABNORMAL HIGH (ref 70–99)

## 2012-07-11 LAB — APTT: aPTT: 39 seconds — ABNORMAL HIGH (ref 24–37)

## 2012-07-11 LAB — TROPONIN I: Troponin I: 16.42 ng/mL (ref <0.30)

## 2012-07-11 LAB — CK TOTAL AND CKMB (NOT AT ARMC): Total CK: 873 U/L — ABNORMAL HIGH (ref 7–177)

## 2012-07-11 LAB — LIPASE, BLOOD: Lipase: 31 U/L (ref 11–59)

## 2012-07-11 MED ORDER — SODIUM CHLORIDE 0.9 % IV SOLN
Freq: Once | INTRAVENOUS | Status: AC
  Administered 2012-07-11: 04:00:00 via INTRAVENOUS

## 2012-07-11 MED ORDER — SODIUM CHLORIDE 0.9 % IV SOLN
Freq: Once | INTRAVENOUS | Status: AC
  Administered 2012-07-11: via INTRAVENOUS

## 2012-07-11 MED ORDER — SODIUM CHLORIDE 0.9 % IV BOLUS (SEPSIS)
1000.0000 mL | Freq: Once | INTRAVENOUS | Status: AC
  Administered 2012-07-11: 1000 mL via INTRAVENOUS

## 2012-07-11 MED ORDER — SODIUM CHLORIDE 0.9 % IV BOLUS (SEPSIS)
500.0000 mL | Freq: Once | INTRAVENOUS | Status: AC
  Administered 2012-07-11: 500 mL via INTRAVENOUS

## 2012-07-11 MED ORDER — HEPARIN BOLUS VIA INFUSION
4000.0000 [IU] | Freq: Once | INTRAVENOUS | Status: AC
  Administered 2012-07-11: 4000 [IU] via INTRAVENOUS

## 2012-07-11 MED ORDER — IOHEXOL 350 MG/ML SOLN: 80.0000 mL | Freq: Once | INTRAVENOUS | Status: DC | PRN

## 2012-07-11 MED ORDER — HEPARIN (PORCINE) IN NACL 100-0.45 UNIT/ML-% IJ SOLN
1000.0000 [IU]/h | INTRAMUSCULAR | Status: DC
  Administered 2012-07-11: 1000 [IU]/h via INTRAVENOUS
  Filled 2012-07-11: qty 250

## 2012-07-11 NOTE — ED Notes (Signed)
Patient transported to CT on cardiac monitor with RN.

## 2012-07-11 NOTE — ED Notes (Signed)
Patient transported to X-ray 

## 2012-07-11 NOTE — ED Notes (Signed)
Dr Bebe Shaggy notified of pt's troponin >16.

## 2012-07-11 NOTE — ED Notes (Signed)
MD at bedside. 

## 2012-07-11 NOTE — ED Notes (Signed)
Pt transferred by [facility] to [facility]. NS infusing through R forearm at 100cc/hr. Heaprin gtt infusing through left AC at 1,000 units/hour. Pt rates pain 8/10. Pt alert and oriented at time of transfer

## 2012-07-11 NOTE — ED Notes (Signed)
Patient returned from CT

## 2012-07-11 NOTE — ED Notes (Signed)
Assigned to room 458 @ [facility] per nursing supervisor, Tammy, RN notified, [facility] called for transport.

## 2012-07-11 NOTE — ED Notes (Signed)
Report given to [facility]. 

## 2012-07-11 NOTE — ED Notes (Addendum)
I took CBG and got 101 mg. / dcltr.

## 2012-07-11 NOTE — ED Provider Notes (Addendum)
History     CSN: [id number]  Arrival date & time 07/10/12  2325   First MD Initiated Contact with Patient 07/10/12 2340      Chief Complaint  Patient presents with  . sob, upper body pain, neck pain      Patient is a 66 y.o. female presenting with chest pain. The history is provided by the patient.  Chest Pain The chest pain began yesterday. Episode Length: a few seconds. Chest pain occurs intermittently. The pain is associated with breathing (swallowing). The severity of the pain is moderate. The quality of the pain is described as sharp. Radiates to: left shoulder, neck. Chest pain is worsened by deep breathing (swallowing). Primary symptoms include nausea and vomiting. Pertinent negatives for primary symptoms include no abdominal pain.  Pertinent negatives for associated symptoms include no lower extremity edema. She tried aspirin for the symptoms.   Pt reports earlier this week she developed indigestion and started to vomit - she reports up to 15 episodes of nonbloody emesis since. She reports soon after vomiting she developed chest pain.  The pain seems worse with breathing and swallowing.  She has some pain at times in shoulder and neck.  No SOB at rest.  She reports nonbloody BM that was loose but otherwise no other diarrhea.  No abd pain.    She reports recent dental surgery but no complications thus far.  She also reports she had colon resection last august.    Past Medical History  Diagnosis Date  . Hyperlipidemia   . Thyroid disease     hypothyroid  . Anemia     pernicious  . Asthma   . Diabetes mellitus     type 2  . Tobacco abuse   . Sjogren's syndrome 03/17/2011    Past Surgical History  Procedure Date  . Knee surgery 2006    arthroscopic left knee  . Knee surgery 2001    right knee  . Dilation and curettage of uterus 1975  . Colon surgery 8/.23/13    tumor removed from sigmoid colon.    Family History  Problem Relation Age of Onset  . Heart disease  Other     CAD  . Diabetes Other   . Hyperlipidemia Other   . Stroke Other     History  Substance Use Topics  . Smoking status: Former Smoker    Types: Cigarettes  . Smokeless tobacco: Not on file     Comment: 3 cigarettes a month. Uses smokeless cigarette.  . Alcohol Use: 0.6 oz/week    1 Glasses of wine per week    OB History    Grav Para Term Preterm Abortions TAB SAB Ect Mult Living                  Review of Systems  HENT: Negative for sore throat and facial swelling.   Cardiovascular: Positive for chest pain.  Gastrointestinal: Positive for nausea and vomiting. Negative for abdominal pain.  Neurological: Negative for syncope.  All other systems reviewed and are negative.    Allergies  Cefuroxime axetil; Levofloxacin; and Statins  Home Medications   Current Outpatient Rx  Name  Route  Sig  Dispense  Refill  . ADVAIR DISKUS 250-50 MCG/DOSE IN AEPB      INHALE 1 PUFF INTO THE LUNGS 2 (TWO) TIMES DAILY.   60 each   3   . ALBUTEROL SULFATE HFA 108 (90 BASE) MCG/ACT IN AERS   Inhalation   Inhale 2  puffs into the lungs every 6 (six) hours as needed.   1 Inhaler   5   . ASPIRIN 81 MG PO TABS   Oral   Take 81 mg by mouth daily.           Marland Kitchen LEVOTHYROXINE SODIUM 100 MCG PO TABS      TAKE 1 TABLET (100 MCG TOTAL) BY MOUTH DAILY.   30 tablet   3   . MULTIVITAMINS PO TABS   Oral   Take 1 tablet by mouth daily.           . NYSTATIN 100000 UNIT/ML MT SUSP      TAKE 1 TEASPOONFUL BY MOUTH SWISH AND SWALLOW 4 TIMES DAILY FOR 1 WEEK FOR THRUSH IF NEEDED.   180 mL   0   . PANTOPRAZOLE SODIUM 40 MG PO TBEC      TAKE 1 TABLET (40 MG TOTAL) BY MOUTH DAILY.   30 tablet   3   . ZOLPIDEM TARTRATE 5 MG PO TABS      TAKE ONE TABLET BY MOUTH AT BEDTIME   30 tablet   0     BP 121/63  Pulse 95  Temp 100.1 F (37.8 C) (Oral)  Resp 20  Wt 175 lb (79.379 kg)  SpO2 96%  Physical Exam CONSTITUTIONAL: Well developed/well nourished HEAD AND FACE:  Normocephalic/atraumatic EYES: EOMI/PERRL ENMT: Mucous membranes moist. Uvula midline.  Voice normal.   NECK: supple no meningeal signs, no crepitance noted SPINE:entire spine nontender CV: S1/S2 noted, no murmurs/rubs/gallops noted Chest - no tenderness/crepitance noted LUNGS: Lungs are clear to auscultation bilaterally, no apparent distress ABDOMEN: soft, nontender, no rebound or guarding GU:no cva tenderness NEURO: Pt is awake/alert, moves all extremitiesx4 EXTREMITIES: pulses normal, full ROM SKIN: warm, color normal PSYCH: no abnormalities of mood noted    ED Course  Procedures  CRITICAL CARE Performed by: Joya Gaskins   Total critical care time: 38  Critical care time was exclusive of separately billable procedures and treating other patients.  Critical care was necessary to treat or prevent imminent or life-threatening deterioration.  Critical care was time spent personally by me on the following activities: development of treatment plan with patient and/or surrogate as well as nursing, discussions with consultants, evaluation of patient's response to treatment, examination of patient, obtaining history from patient or surrogate, ordering and performing treatments and interventions, ordering and review of laboratory studies, ordering and review of radiographic studies, pulse oximetry and re-evaluation of patient's condition.   Labs Reviewed  CBC WITH DIFFERENTIAL  COMPREHENSIVE METABOLIC PANEL  LIPASE, BLOOD  TROPONIN I   12:48 AM Pt with severe nausea/vomiting now having chest pain and shoulder pain Pt reports she already took ASA earlier today Elevated troponin noted, however story less c/w ACS as had severe pain after vomiting and pain with deep breathing/swallowing.  Xray imaging negative but concern for esophageal injury from foreceful vomiting still high (also has low grade fever with elevated WBC) Her repeat EKG shows ?minimal elevation/biphasic in V5/V6  without any obvious reciprocal changes.   She is stable at this time without active CP at rest.  At this point, feel it is important to r/o other acute causes of her pain before diagnosing nonstemi 1:17 AM BP dropped to 70s, now improving with IV fluids and currently SBP 85 She is awake/alert, has no new complaints Will proceed with CT imaging 2:07 AM Ct chest negative for PE/esophageal injury SBP has improved Will admit for nonstemi Pt requests  to go to [facility].  She has no h/o cardiac disease She will need f/u on incidental findings on ct chest 2:23 AM Awaiting call back from [facility] 2:44 AM D/w dr Beverely Pace with cardiology at [facility] Requests I start heparin Will admit to [facility].   Will send CD with imaging with patient   MDM  Nursing notes including past medical history and social history reviewed and considered in documentation xrays reviewed and considered Labs/vital reviewed and considered Previous records reviewed and considered - recent PCP visits reviewed        Date: 07/11/2012  Rate: 89  Rhythm: normal sinus rhythm  QRS Axis: normal  Intervals: normal  ST/T Wave abnormalities: nonspecific ST changes  Conduction Disutrbances:none Poor quality EKG noted    Date: 07/11/2012  Rate: 75  Rhythm: normal sinus rhythm  QRS Axis: normal  Intervals: normal  ST/T Wave abnormalities: nonspecific ST changes and ?elevation V5/V6   Conduction Disutrbances:none  Narrative Interpretation:   Old EKG Reviewed: changes noted     Date: 07/11/2012 0214am  Rate: 77  Rhythm: normal sinus rhythm  QRS Axis: normal  Intervals: normal  ST/T Wave abnormalities: inverted t waves laterally  Conduction Disutrbances:none  Narrative Interpretation:   Old EKG Reviewed: changes noted    Joya Gaskins, MD 07/11/12 0244  Joya Gaskins, MD 07/11/12 0245  3:43 AM SBP between 85-90.  I spoke to dr Beverely Pace with cardiology He is comfortable keeping her BP in  this range without central line as patient has responded to IV fluids   Joya Gaskins, MD 07/11/12 [phone]

## 2012-07-14 ENCOUNTER — Telehealth: Payer: Self-pay | Admitting: *Deleted

## 2012-07-14 ENCOUNTER — Ambulatory Visit: Payer: Medicare Other | Admitting: Family

## 2012-07-14 NOTE — Telephone Encounter (Signed)
Records reviewed from [facility]. It appears that she did not sign out AMA and her lung exam was stable this AM at discharge.  OK to follow up Friday, but she should go to the ER if she develops increased sob or chest pain.

## 2012-07-14 NOTE — Telephone Encounter (Signed)
Received call from pt's niece, Billey Gosling stating pt discharged herself home from [facility] today. She reports that no medications were sent home with pt but recalls that pt was given Vicodin and ativan yesterday evening. Crystal reports that pt seemed confused afterwards but thought this was coming from the medication. After coming home today pt thought she saw bugs on the wall. Pt's niece states that pt was not given antibiotics at discharge. Pt has cough with discolored sputum. Scheduled pt appt to be seen today at 3:30pm. Pt called back and cancelled stating she "felt too winded" and tired to try and come in today. Pt r/s for Friday. Records received from [facility] and forwarded to Provider for review.  Please advise.

## 2012-07-14 NOTE — Telephone Encounter (Signed)
Left detailed message on home# and to call if any questions. 

## 2012-07-15 DIAGNOSIS — J45909 Unspecified asthma, uncomplicated: Secondary | ICD-10-CM | POA: Diagnosis not present

## 2012-07-15 DIAGNOSIS — I515 Myocardial degeneration: Secondary | ICD-10-CM | POA: Diagnosis not present

## 2012-07-15 DIAGNOSIS — Z85038 Personal history of other malignant neoplasm of large intestine: Secondary | ICD-10-CM | POA: Diagnosis not present

## 2012-07-15 DIAGNOSIS — M069 Rheumatoid arthritis, unspecified: Secondary | ICD-10-CM | POA: Diagnosis not present

## 2012-07-16 ENCOUNTER — Ambulatory Visit: Payer: Medicare Other | Admitting: Family

## 2012-07-16 DIAGNOSIS — R0989 Other specified symptoms and signs involving the circulatory and respiratory systems: Secondary | ICD-10-CM | POA: Diagnosis not present

## 2012-07-16 DIAGNOSIS — J90 Pleural effusion, not elsewhere classified: Secondary | ICD-10-CM | POA: Diagnosis not present

## 2012-07-16 DIAGNOSIS — R0609 Other forms of dyspnea: Secondary | ICD-10-CM | POA: Diagnosis not present

## 2012-07-16 DIAGNOSIS — I319 Disease of pericardium, unspecified: Secondary | ICD-10-CM | POA: Diagnosis not present

## 2012-07-17 LAB — CULTURE, BLOOD (ROUTINE X 2): Culture: NO GROWTH

## 2012-07-19 DIAGNOSIS — I319 Disease of pericardium, unspecified: Secondary | ICD-10-CM | POA: Diagnosis not present

## 2012-07-19 DIAGNOSIS — J45909 Unspecified asthma, uncomplicated: Secondary | ICD-10-CM | POA: Diagnosis not present

## 2012-07-19 DIAGNOSIS — F17200 Nicotine dependence, unspecified, uncomplicated: Secondary | ICD-10-CM | POA: Diagnosis not present

## 2012-07-19 DIAGNOSIS — R0609 Other forms of dyspnea: Secondary | ICD-10-CM | POA: Diagnosis not present

## 2012-07-19 DIAGNOSIS — J90 Pleural effusion, not elsewhere classified: Secondary | ICD-10-CM | POA: Diagnosis not present

## 2012-07-20 ENCOUNTER — Ambulatory Visit: Payer: Medicare Other | Admitting: Family

## 2012-07-26 ENCOUNTER — Encounter: Payer: Self-pay | Admitting: Family

## 2012-07-26 ENCOUNTER — Ambulatory Visit (INDEPENDENT_AMBULATORY_CARE_PROVIDER_SITE_OTHER): Payer: Medicare Other | Admitting: Family

## 2012-07-26 VITALS — BP 110/80 | HR 90 | Temp 98.2°F | Resp 16 | Ht 62.75 in | Wt 176.0 lb

## 2012-07-26 DIAGNOSIS — Z8679 Personal history of other diseases of the circulatory system: Secondary | ICD-10-CM

## 2012-07-26 DIAGNOSIS — Z5181 Encounter for therapeutic drug level monitoring: Secondary | ICD-10-CM

## 2012-07-26 DIAGNOSIS — M069 Rheumatoid arthritis, unspecified: Secondary | ICD-10-CM | POA: Diagnosis not present

## 2012-07-26 DIAGNOSIS — I319 Disease of pericardium, unspecified: Secondary | ICD-10-CM

## 2012-07-26 HISTORY — DX: Personal history of other diseases of the circulatory system: Z86.79

## 2012-07-26 HISTORY — DX: Disease of pericardium, unspecified: I31.9

## 2012-07-26 NOTE — Assessment & Plan Note (Signed)
Will refer to Rheumatology at [facility] at pt's request.

## 2012-07-26 NOTE — Progress Notes (Signed)
Subjective:    Patient ID: Sheryl [name], female    DOB: 08-22-1946, 66 y.o.   MRN: [id number]  HPI  Sheryl [name] is a 66 yr old female who presents today for hospital follow up. She was hospitalized 11/17 through 11/19 following a 1 week episode of viral syndrome. She reports that she initially had violent vomitting.  Niece brought her to the med [facility].  She had a + tryponin and was transported to [facility] at her request.    She was diagnosed with pericarditis.  She was admitted to [facility] and cardiology was consulted.  Records are reviewed.  11/17 CXR noted interval development of bilateral pulmonary edema. And pleural effusions.  She was started with prednisone and reports that her symptoms improved with within 2 days.  She has had dental implant surgery.  She would like to establish with a rheumatologist at [facility] due to proximity to her home.   Since returning home she has has some mild SOB with exertion.  She saw pulmonology, reports CXR showed fluid on the lungs.  She reports that she continues furosemide since her discharge from the hospital.  She had PFT's. She sees Dr. Beverely Pace on 12/2 and sees oncology on 12/11 and has follow up with pulmonology on 1/1 for additional PFTs. She denies current chest pain. She reports mild LE edema- which is improved.    She reports that her hallucinations happened after she took ativan.  She reports that she took it twice and both times she had visual hallucinations.     Review of Systems See HPI  Past Medical History  Diagnosis Date  . Hyperlipidemia   . Thyroid disease     hypothyroid  . Anemia     pernicious  . Asthma   . Diabetes mellitus     type 2  . Tobacco abuse   . Sjogren's syndrome 03/17/2011  . Colon cancer   . Rheumatoid arthritis     History   Social History  . Marital Status: Single    Spouse Name: N/A    Number of Children: 1  . Years of Education: N/A   Occupational History  . unemployed     Social History Main Topics  . Smoking status: Former Smoker    Types: Cigarettes  . Smokeless tobacco: Not on file     Comment: 3 cigarettes a month. Uses smokeless cigarette.  . Alcohol Use: 0.6 oz/week    1 Glasses of wine per week  . Drug Use: Not on file  . Sexually Active: Not on file   Other Topics Concern  . Not on file   Social History Narrative   Regular exercise:  Stretching exercises. Resistance bandsCaffeine: 1 mug (2cus) daily.    Past Surgical History  Procedure Date  . Knee surgery 2006    arthroscopic left knee  . Knee surgery 2001    right knee  . Dilation and curettage of uterus 1975  . Colon surgery 8/.23/13    tumor removed from sigmoid colon.    Family History  Problem Relation Age of Onset  . Heart disease Other     CAD  . Diabetes Other   . Hyperlipidemia Other   . Stroke Other     Allergies  Allergen Reactions  . Cefuroxime Axetil     REACTION: asthma, cough  . Levofloxacin   . Statins     Muscle pain, syncope    Current Outpatient Prescriptions on File Prior to  Visit  Medication Sig Dispense Refill  . ADVAIR DISKUS 250-50 MCG/DOSE AEPB INHALE 1 PUFF INTO THE LUNGS 2 (TWO) TIMES DAILY.  60 each  3  . albuterol (VENTOLIN HFA) 108 (90 BASE) MCG/ACT inhaler Inhale 2 puffs into the lungs every 6 (six) hours as needed.  1 Inhaler  5  . aspirin 81 MG tablet Take 81 mg by mouth daily.        Marland Kitchen levothyroxine (SYNTHROID, LEVOTHROID) 100 MCG tablet TAKE 1 TABLET (100 MCG TOTAL) BY MOUTH DAILY.  30 tablet  3  . multivitamin (THERAGRAN) per tablet Take 1 tablet by mouth daily.        Marland Kitchen nystatin (MYCOSTATIN) 100000 UNIT/ML suspension TAKE 1 TEASPOONFUL BY MOUTH SWISH AND SWALLOW 4 TIMES DAILY FOR 1 WEEK FOR THRUSH IF NEEDED.  180 mL  0  . pantoprazole (PROTONIX) 40 MG tablet TAKE 1 TABLET (40 MG TOTAL) BY MOUTH DAILY.  30 tablet  3  . zolpidem (AMBIEN) 5 MG tablet TAKE ONE TABLET BY MOUTH AT BEDTIME  30 tablet  0  . furosemide (LASIX) 40 MG  tablet Take 40 mg by mouth daily.        BP 110/80  Pulse 90  Temp 98.2 F (36.8 C) (Oral)  Resp 16  Ht 5' 2.75" (1.594 m)  Wt 176 lb (79.833 kg)  BMI 31.43 kg/m2  SpO2 99%       Objective:   Physical Exam  Constitutional: She appears well-developed and well-nourished. No distress.  HENT:  Head: Normocephalic and atraumatic.  Cardiovascular: Normal rate and regular rhythm.   No murmur heard. Pulmonary/Chest: Effort normal and breath sounds normal. No respiratory distress. She has no wheezes. She has no rales.  Musculoskeletal: She exhibits no edema.  Lymphadenopathy:    She has no cervical adenopathy.  Neurological: She is alert.  Skin: Skin is warm and dry. No rash noted. No erythema. No pallor.  Psychiatric: She has a normal mood and affect. Her behavior is normal. Judgment and thought content normal.          Assessment & Plan:

## 2012-07-26 NOTE — Patient Instructions (Addendum)
Please complete your blood work prior to leaving.  You will be contact about your referral to rheumatology. Please let us know if you have not heard back within 1 week about your referral. Follow up in 1 month.

## 2012-07-26 NOTE — Assessment & Plan Note (Signed)
Clinically improved.  Monitor.  Appears euvolemic today. She is instructed to keep her upcoming apt with cardiology and pulmonology.  Obtain BMET, continue furosemide for now.

## 2012-07-27 ENCOUNTER — Encounter: Payer: Self-pay | Admitting: Family

## 2012-07-27 LAB — BASIC METABOLIC PANEL
BUN: 13 mg/dL (ref 6–23)
Creat: 0.8 mg/dL (ref 0.50–1.10)
Glucose, Bld: 80 mg/dL (ref 70–99)
Potassium: 5.1 mEq/L (ref 3.5–5.3)

## 2012-08-05 DIAGNOSIS — I319 Disease of pericardium, unspecified: Secondary | ICD-10-CM | POA: Diagnosis not present

## 2012-08-05 DIAGNOSIS — J90 Pleural effusion, not elsewhere classified: Secondary | ICD-10-CM | POA: Diagnosis not present

## 2012-08-23 DIAGNOSIS — B029 Zoster without complications: Secondary | ICD-10-CM | POA: Diagnosis not present

## 2012-08-23 DIAGNOSIS — M069 Rheumatoid arthritis, unspecified: Secondary | ICD-10-CM | POA: Diagnosis not present

## 2012-08-23 DIAGNOSIS — I319 Disease of pericardium, unspecified: Secondary | ICD-10-CM | POA: Diagnosis not present

## 2012-08-24 DIAGNOSIS — C187 Malignant neoplasm of sigmoid colon: Secondary | ICD-10-CM | POA: Diagnosis not present

## 2012-09-03 ENCOUNTER — Ambulatory Visit (INDEPENDENT_AMBULATORY_CARE_PROVIDER_SITE_OTHER): Payer: Medicare Other | Admitting: Family

## 2012-09-03 ENCOUNTER — Other Ambulatory Visit: Payer: Self-pay | Admitting: Family

## 2012-09-03 ENCOUNTER — Encounter: Payer: Self-pay | Admitting: Family

## 2012-09-03 VITALS — BP 118/74 | HR 84 | Temp 98.2°F | Resp 16 | Ht 62.75 in | Wt 181.0 lb

## 2012-09-03 DIAGNOSIS — J329 Chronic sinusitis, unspecified: Secondary | ICD-10-CM

## 2012-09-03 DIAGNOSIS — F17200 Nicotine dependence, unspecified, uncomplicated: Secondary | ICD-10-CM | POA: Diagnosis not present

## 2012-09-03 DIAGNOSIS — E039 Hypothyroidism, unspecified: Secondary | ICD-10-CM | POA: Diagnosis not present

## 2012-09-03 DIAGNOSIS — E119 Type 2 diabetes mellitus without complications: Secondary | ICD-10-CM

## 2012-09-03 DIAGNOSIS — M069 Rheumatoid arthritis, unspecified: Secondary | ICD-10-CM | POA: Diagnosis not present

## 2012-09-03 LAB — HEMOGLOBIN A1C
Hgb A1c MFr Bld: 6.1 % — ABNORMAL HIGH (ref <5.7)
Mean Plasma Glucose: 128 mg/dL — ABNORMAL HIGH (ref <117)

## 2012-09-03 MED ORDER — DOXYCYCLINE HYCLATE 100 MG PO TABS: 100.0000 mg | ORAL_TABLET | Freq: Two times a day (BID) | ORAL | Status: DC

## 2012-09-03 NOTE — Patient Instructions (Addendum)
Please complete your blood work prior to leaving.  Call if sinus symptoms worsen or if no improvement in 2-3 weeks.   Follow up in 3 months.

## 2012-09-03 NOTE — Assessment & Plan Note (Signed)
Clinically stable.  Obtain A1C.  

## 2012-09-03 NOTE — Assessment & Plan Note (Signed)
Multiple drug allergies, will rx with doxy.

## 2012-09-03 NOTE — Assessment & Plan Note (Signed)
Clinically stable on synthroid. Continue same.  

## 2012-09-03 NOTE — Assessment & Plan Note (Signed)
Now being managed by Dr. Cardell Peach.

## 2012-09-03 NOTE — Assessment & Plan Note (Signed)
Patient reports that she quit.

## 2012-09-03 NOTE — Progress Notes (Signed)
Subjective:    Patient ID: Sheryl [name], female    DOB: August 18, 1946, 67 y.o.   MRN: [id number]  HPI  Sheryl [name] is a 67 yr old female who presents today for follow up.   Diabetes- she does not check her sugars at home.   Hypothyroid- she continues levothyroxine.    Sinus congestion- sinus congestion- started Monday, Tuesday sore throat.  Yesterday turned thick yellow.  She denies fever.    RA- She recently established with Dr. Cardell Peach- he treated her for shingles.  Notes recent worsening joint pain.  She will soon be starting a low dose methotrexate for Dr. Cardell Peach.    Review of Systems    see HPI  Past Medical History  Diagnosis Date  . Hyperlipidemia   . Thyroid disease     hypothyroid  . Anemia     pernicious  . Asthma   . Diabetes mellitus     type 2  . Tobacco abuse   . Sjogren's syndrome 03/17/2011  . Colon cancer   . Rheumatoid arthritis   . Pericarditis 07/26/2012    History   Social History  . Marital Status: Single    Spouse Name: N/A    Number of Children: 1  . Years of Education: N/A   Occupational History  . unemployed    Social History Main Topics  . Smoking status: Former Smoker    Types: Cigarettes  . Smokeless tobacco: Not on file     Comment: 3 cigarettes a month. Uses smokeless cigarette.  . Alcohol Use: 0.6 oz/week    1 Glasses of wine per week  . Drug Use: Not on file  . Sexually Active: Not on file   Other Topics Concern  . Not on file   Social History Narrative   Regular exercise:  Stretching exercises. Resistance bandsCaffeine: 1 mug (2cus) daily.    Past Surgical History  Procedure Date  . Knee surgery 2006    arthroscopic left knee  . Knee surgery 2001    right knee  . Dilation and curettage of uterus 1975  . Colon surgery 8/.23/13    tumor removed from sigmoid colon.    Family History  Problem Relation Age of Onset  . Heart disease Other     CAD  . Diabetes Other   . Hyperlipidemia Other   . Stroke Other       Allergies  Allergen Reactions  . Ativan (Lorazepam)     Visual hallucinations.   . Cefuroxime Axetil     REACTION: asthma, cough  . Levofloxacin   . Statins     Muscle pain, syncope    Current Outpatient Prescriptions on File Prior to Visit  Medication Sig Dispense Refill  . ADVAIR DISKUS 250-50 MCG/DOSE AEPB INHALE 1 PUFF INTO THE LUNGS 2 (TWO) TIMES DAILY.  60 each  3  . albuterol (VENTOLIN HFA) 108 (90 BASE) MCG/ACT inhaler Inhale 2 puffs into the lungs every 6 (six) hours as needed.  1 Inhaler  5  . aspirin 81 MG tablet Take 81 mg by mouth daily.        . furosemide (LASIX) 40 MG tablet Take 40 mg by mouth daily.      Marland Kitchen levothyroxine (SYNTHROID, LEVOTHROID) 100 MCG tablet TAKE 1 TABLET (100 MCG TOTAL) BY MOUTH DAILY.  30 tablet  3  . multivitamin (THERAGRAN) per tablet Take 1 tablet by mouth daily.        Marland Kitchen nystatin (MYCOSTATIN) 100000 UNIT/ML suspension TAKE  1 TEASPOONFUL BY MOUTH SWISH AND SWALLOW 4 TIMES DAILY FOR 1 WEEK FOR THRUSH IF NEEDED.  180 mL  0  . pantoprazole (PROTONIX) 40 MG tablet TAKE 1 TABLET (40 MG TOTAL) BY MOUTH DAILY.  30 tablet  3  . zolpidem (AMBIEN) 5 MG tablet TAKE ONE TABLET BY MOUTH AT BEDTIME  30 tablet  0    BP 118/74  Pulse 84  Temp 98.2 F (36.8 C) (Oral)  Resp 16  Ht 5' 2.75" (1.594 m)  Wt 181 lb (82.101 kg)  BMI 32.32 kg/m2  SpO2 98%    Objective:   Physical Exam  Constitutional: She appears well-developed and well-nourished. No distress.  Cardiovascular: Normal rate and regular rhythm.   No murmur heard. Pulmonary/Chest: Effort normal and breath sounds normal. No respiratory distress. She has no wheezes. She has no rales. She exhibits no tenderness.  Musculoskeletal: She exhibits no edema.  Psychiatric: She has a normal mood and affect. Her behavior is normal. Judgment and thought content normal.  declines diabetic foot exam today.        Assessment & Plan:

## 2012-09-06 ENCOUNTER — Encounter: Payer: Self-pay | Admitting: Family

## 2012-09-09 ENCOUNTER — Telehealth: Payer: Self-pay | Admitting: *Deleted

## 2012-09-09 DIAGNOSIS — E039 Hypothyroidism, unspecified: Secondary | ICD-10-CM

## 2012-09-09 NOTE — Telephone Encounter (Signed)
Request for TSH add-on lab cannot be performed d/t to no SST tube drawn in original lab orders; please inform patient that she will need to come back in to office for lab if still would like to have TSH for patient/SLS

## 2012-09-10 DIAGNOSIS — C187 Malignant neoplasm of sigmoid colon: Secondary | ICD-10-CM | POA: Diagnosis not present

## 2012-09-10 DIAGNOSIS — I319 Disease of pericardium, unspecified: Secondary | ICD-10-CM | POA: Diagnosis not present

## 2012-09-13 NOTE — Telephone Encounter (Signed)
Notified pt and she states she will return to the lab this week for TSH. Order entered and given to the lab.

## 2012-09-13 NOTE — Telephone Encounter (Signed)
Yes please

## 2012-09-13 NOTE — Telephone Encounter (Signed)
Pt has f/u in April. Do I need to call her in earlier for TSH?

## 2012-09-20 ENCOUNTER — Telehealth: Payer: Self-pay | Admitting: Family

## 2012-09-20 MED ORDER — LEVOTHYROXINE SODIUM 100 MCG PO TABS: 100.0000 ug | ORAL_TABLET | Freq: Every day | ORAL | Status: DC

## 2012-09-20 MED ORDER — PANTOPRAZOLE SODIUM 40 MG PO TBEC: 40.0000 mg | DELAYED_RELEASE_TABLET | Freq: Every day | ORAL | Status: DC

## 2012-09-20 NOTE — Telephone Encounter (Signed)
Refill-pantoprazole 40mg  tab.

## 2012-09-20 NOTE — Telephone Encounter (Signed)
Verified mail order pharmacy with pt and refills sent. Pt states she will return to the lab this week as she has had a cold and did not want to come out.

## 2012-09-20 NOTE — Telephone Encounter (Signed)
Refill- levothroid tab.

## 2012-09-21 ENCOUNTER — Telehealth: Payer: Self-pay | Admitting: Family

## 2012-09-21 MED ORDER — FLUTICASONE-SALMETEROL 250-50 MCG/DOSE IN AEPB: 1.0000 | INHALATION_SPRAY | Freq: Two times a day (BID) | RESPIRATORY_TRACT | Status: DC

## 2012-09-21 MED ORDER — FLUTICASONE PROPIONATE 50 MCG/ACT NA SUSP: 2.0000 | Freq: Every day | NASAL | Status: DC

## 2012-09-21 NOTE — Telephone Encounter (Signed)
Refill- fluticasone pro nasal spray 59mcg(16 gm). Qty 48  Refill- advair diskus 250/50(60 ea). Qty 180

## 2012-09-21 NOTE — Telephone Encounter (Signed)
Refills sent

## 2012-10-27 DIAGNOSIS — D126 Benign neoplasm of colon, unspecified: Secondary | ICD-10-CM | POA: Diagnosis not present

## 2012-10-27 DIAGNOSIS — K639 Disease of intestine, unspecified: Secondary | ICD-10-CM | POA: Diagnosis not present

## 2012-10-27 DIAGNOSIS — Z1211 Encounter for screening for malignant neoplasm of colon: Secondary | ICD-10-CM | POA: Diagnosis not present

## 2012-11-05 DIAGNOSIS — Z8619 Personal history of other infectious and parasitic diseases: Secondary | ICD-10-CM | POA: Diagnosis not present

## 2012-11-05 DIAGNOSIS — M3500 Sicca syndrome, unspecified: Secondary | ICD-10-CM | POA: Diagnosis not present

## 2012-11-24 ENCOUNTER — Telehealth: Payer: Self-pay | Admitting: Family

## 2012-11-24 MED ORDER — ZOLPIDEM TARTRATE 5 MG PO TABS: ORAL_TABLET | ORAL | Status: DC

## 2012-11-24 NOTE — Telephone Encounter (Signed)
Refill- zolpidem tartrate 5mg  tab. Take one tablet by mouth at bedtime. Qty 30 last fill 10.21.13

## 2012-11-24 NOTE — Telephone Encounter (Signed)
Refill called to [place] at [facility]

## 2012-11-30 ENCOUNTER — Ambulatory Visit: Payer: Medicare Other | Admitting: Family

## 2012-12-06 DIAGNOSIS — C187 Malignant neoplasm of sigmoid colon: Secondary | ICD-10-CM | POA: Diagnosis not present

## 2012-12-06 DIAGNOSIS — C189 Malignant neoplasm of colon, unspecified: Secondary | ICD-10-CM | POA: Diagnosis not present

## 2012-12-13 ENCOUNTER — Ambulatory Visit (INDEPENDENT_AMBULATORY_CARE_PROVIDER_SITE_OTHER): Payer: Medicare Other | Admitting: Family

## 2012-12-13 ENCOUNTER — Encounter: Payer: Self-pay | Admitting: Family

## 2012-12-13 VITALS — BP 118/82 | HR 88 | Temp 98.5°F | Resp 16 | Ht 62.75 in | Wt 193.0 lb

## 2012-12-13 DIAGNOSIS — E119 Type 2 diabetes mellitus without complications: Secondary | ICD-10-CM | POA: Diagnosis not present

## 2012-12-13 DIAGNOSIS — M069 Rheumatoid arthritis, unspecified: Secondary | ICD-10-CM | POA: Diagnosis not present

## 2012-12-13 DIAGNOSIS — I10 Essential (primary) hypertension: Secondary | ICD-10-CM | POA: Diagnosis not present

## 2012-12-13 DIAGNOSIS — J309 Allergic rhinitis, unspecified: Secondary | ICD-10-CM

## 2012-12-13 DIAGNOSIS — E039 Hypothyroidism, unspecified: Secondary | ICD-10-CM | POA: Diagnosis not present

## 2012-12-13 LAB — BASIC METABOLIC PANEL
Chloride: 104 mEq/L (ref 96–112)
Potassium: 4.8 mEq/L (ref 3.5–5.3)

## 2012-12-13 LAB — TSH: TSH: 0.886 u[IU]/mL (ref 0.350–4.500)

## 2012-12-13 NOTE — Assessment & Plan Note (Signed)
She is following with Dr. Cardell Peach. Clinically stable.  Management per rheumatology.

## 2012-12-13 NOTE — Assessment & Plan Note (Signed)
Clinically stable on levothyroxine, check TSH.

## 2012-12-13 NOTE — Assessment & Plan Note (Signed)
Clinically stable- diet controlled. Check A1C.

## 2012-12-13 NOTE — Patient Instructions (Addendum)
Please complete your lab work prior to leaving. Follow up in 3 months.   

## 2012-12-13 NOTE — Assessment & Plan Note (Signed)
Continue synthroid.

## 2012-12-13 NOTE — Progress Notes (Signed)
Subjective:    Patient ID: Sheryl [name], female    DOB: [birth year], 67 y.o.   MRN: [id number]  HPI  Sheryl [name] is a 67 yr old female who presents today for follow up.  1) DM2- last A1C was 6.1.  2) Hypothyroid- She is maintained on synthroid.    3) Allergic rhinitis- some sneezing, eye drainage. Continues flonase.   4) RA- following with Dr. Cardell Peach.  She is continuing the methotrexate.  He thinks that pericarditis was a result of the RA.       Review of Systems See HPI  Past Medical History  Diagnosis Date  . Hyperlipidemia   . Thyroid disease     hypothyroid  . Anemia     pernicious  . Asthma   . Diabetes mellitus     type 2  . Tobacco abuse   . Sjogren's syndrome 03/17/2011  . Colon cancer   . Rheumatoid arthritis   . Pericarditis 07/26/2012  . History of pericarditis 07/26/2012    History   Social History  . Marital Status: Single    Spouse Name: N/A    Number of Children: 1  . Years of Education: N/A   Occupational History  . unemployed    Social History Main Topics  . Smoking status: Former Smoker    Types: Cigarettes  . Smokeless tobacco: Not on file     Comment: 3 cigarettes a month. Uses smokeless cigarette.  . Alcohol Use: 0.6 oz/week    1 Glasses of wine per week  . Drug Use: Not on file  . Sexually Active: Not on file   Other Topics Concern  . Not on file   Social History Narrative   Regular exercise:  Stretching exercises. Resistance bands   Caffeine: 1 mug (2cus) daily.          Past Surgical History  Procedure Laterality Date  . Knee surgery  2006    arthroscopic left knee  . Knee surgery  2001    right knee  . Dilation and curettage of uterus  1975  . Colon surgery  8/.23/13    tumor removed from sigmoid colon.    Family History  Problem Relation Age of Onset  . Heart disease Other     CAD  . Diabetes Other   . Hyperlipidemia Other   . Stroke Other     Allergies  Allergen Reactions  . Ativan (Lorazepam)      Visual hallucinations.   . Cefuroxime Axetil     REACTION: asthma, cough  . Levofloxacin   . Statins     Muscle pain, syncope    Current Outpatient Prescriptions on File Prior to Visit  Medication Sig Dispense Refill  . albuterol (VENTOLIN HFA) 108 (90 BASE) MCG/ACT inhaler Inhale 2 puffs into the lungs every 6 (six) hours as needed.  1 Inhaler  5  . aspirin 81 MG tablet Take 81 mg by mouth daily.        Marland Kitchen doxycycline (VIBRA-TABS) 100 MG tablet Take 1 tablet (100 mg total) by mouth 2 (two) times daily.  20 tablet  0  . fluticasone (FLONASE) 50 MCG/ACT nasal spray Place 2 sprays into the nose daily.  48 g  1  . Fluticasone-Salmeterol (ADVAIR DISKUS) 250-50 MCG/DOSE AEPB Inhale 1 puff into the lungs 2 (two) times daily.  180 each  1  . furosemide (LASIX) 40 MG tablet Take 40 mg by mouth daily.      Marland Kitchen levothyroxine (SYNTHROID,  LEVOTHROID) 100 MCG tablet Take 1 tablet (100 mcg total) by mouth daily.  90 tablet  1  . multivitamin (THERAGRAN) per tablet Take 1 tablet by mouth daily.        Marland Kitchen nystatin (MYCOSTATIN) 100000 UNIT/ML suspension TAKE 1 TEASPOONFUL BY MOUTH SWISH AND SWALLOW 4 TIMES DAILY FOR 1 WEEK FOR THRUSH IF NEEDED.  180 mL  0  . pantoprazole (PROTONIX) 40 MG tablet Take 1 tablet (40 mg total) by mouth daily.  90 tablet  1  . zolpidem (AMBIEN) 5 MG tablet TAKE ONE TABLET BY MOUTH AT BEDTIME  30 tablet  0   No current facility-administered medications on file prior to visit.    BP 118/82  Pulse 88  Temp(Src) 98.5 F (36.9 C) (Oral)  Resp 16  Ht 5' 2.75" (1.594 m)  Wt 193 lb 0.6 oz (87.562 kg)  BMI 34.46 kg/m2  SpO2 98%       Objective:   Physical Exam  Constitutional: She is oriented to person, place, and time. She appears well-developed and well-nourished. No distress.  HENT:  Head: Normocephalic and atraumatic.  Cardiovascular: Normal rate and regular rhythm.   No murmur heard. Pulmonary/Chest: Effort normal and breath sounds normal. No respiratory distress.  She has no wheezes. She has no rales. She exhibits no tenderness.  Musculoskeletal: She exhibits no edema.  Neurological: She is alert and oriented to person, place, and time.  Psychiatric: She has a normal mood and affect. Her behavior is normal. Judgment and thought content normal.          Assessment & Plan:

## 2012-12-14 ENCOUNTER — Encounter: Payer: Self-pay | Admitting: Family

## 2012-12-14 LAB — HEMOGLOBIN A1C: Mean Plasma Glucose: 120 mg/dL — ABNORMAL HIGH (ref <117)

## 2012-12-29 ENCOUNTER — Ambulatory Visit: Payer: Medicare Other | Admitting: Family

## 2013-01-06 ENCOUNTER — Other Ambulatory Visit: Payer: Self-pay | Admitting: Family

## 2013-01-06 NOTE — Telephone Encounter (Signed)
Refill request for Ambien Last filled by MD on - 11/24/12 #30 x0 Last seen - 12/13/12 F/U - 04/12/13 Please advise refill?

## 2013-01-07 ENCOUNTER — Telehealth: Payer: Self-pay | Admitting: Family

## 2013-01-07 MED ORDER — ZOLPIDEM TARTRATE 5 MG PO TABS: ORAL_TABLET | ORAL | Status: DC

## 2013-01-07 NOTE — Telephone Encounter (Signed)
Saw pt on parking lot. She stopped me and requested refill of ambien.  Refill called in.

## 2013-01-10 NOTE — Telephone Encounter (Signed)
Rx already sent.

## 2013-02-07 ENCOUNTER — Other Ambulatory Visit: Payer: Self-pay | Admitting: Family

## 2013-02-07 NOTE — Telephone Encounter (Signed)
rx called into pharmacy

## 2013-02-07 NOTE — Telephone Encounter (Signed)
OK to send 30 tabs with zero refills.  

## 2013-02-09 DIAGNOSIS — M3500 Sicca syndrome, unspecified: Secondary | ICD-10-CM | POA: Diagnosis not present

## 2013-02-09 DIAGNOSIS — M069 Rheumatoid arthritis, unspecified: Secondary | ICD-10-CM | POA: Diagnosis not present

## 2013-02-09 DIAGNOSIS — L408 Other psoriasis: Secondary | ICD-10-CM | POA: Diagnosis not present

## 2013-02-10 ENCOUNTER — Ambulatory Visit (INDEPENDENT_AMBULATORY_CARE_PROVIDER_SITE_OTHER): Payer: Medicare Other | Admitting: Family

## 2013-02-10 ENCOUNTER — Telehealth: Payer: Self-pay | Admitting: Family

## 2013-02-10 ENCOUNTER — Encounter: Payer: Self-pay | Admitting: Family

## 2013-02-10 VITALS — BP 128/80 | HR 82 | Temp 98.8°F | Wt 196.0 lb

## 2013-02-10 DIAGNOSIS — Z136 Encounter for screening for cardiovascular disorders: Secondary | ICD-10-CM | POA: Diagnosis not present

## 2013-02-10 DIAGNOSIS — E119 Type 2 diabetes mellitus without complications: Secondary | ICD-10-CM

## 2013-02-10 DIAGNOSIS — J45909 Unspecified asthma, uncomplicated: Secondary | ICD-10-CM

## 2013-02-10 DIAGNOSIS — C189 Malignant neoplasm of colon, unspecified: Secondary | ICD-10-CM

## 2013-02-10 DIAGNOSIS — F329 Major depressive disorder, single episode, unspecified: Secondary | ICD-10-CM | POA: Diagnosis not present

## 2013-02-10 DIAGNOSIS — Z0000 Encounter for general adult medical examination without abnormal findings: Secondary | ICD-10-CM

## 2013-02-10 DIAGNOSIS — M8580 Other specified disorders of bone density and structure, unspecified site: Secondary | ICD-10-CM

## 2013-02-10 DIAGNOSIS — K219 Gastro-esophageal reflux disease without esophagitis: Secondary | ICD-10-CM

## 2013-02-10 DIAGNOSIS — E785 Hyperlipidemia, unspecified: Secondary | ICD-10-CM

## 2013-02-10 DIAGNOSIS — M949 Disorder of cartilage, unspecified: Secondary | ICD-10-CM

## 2013-02-10 DIAGNOSIS — M069 Rheumatoid arthritis, unspecified: Secondary | ICD-10-CM

## 2013-02-10 DIAGNOSIS — E039 Hypothyroidism, unspecified: Secondary | ICD-10-CM

## 2013-02-10 DIAGNOSIS — Z87891 Personal history of nicotine dependence: Secondary | ICD-10-CM

## 2013-02-10 DIAGNOSIS — M3500 Sicca syndrome, unspecified: Secondary | ICD-10-CM

## 2013-02-10 DIAGNOSIS — D510 Vitamin B12 deficiency anemia due to intrinsic factor deficiency: Secondary | ICD-10-CM

## 2013-02-10 NOTE — Telephone Encounter (Signed)
Notified pt with mellissa response. Marj will enter appt on nurse visit for 9:00am.../lmb

## 2013-02-10 NOTE — Assessment & Plan Note (Signed)
S/p colectomy. Clinically stable, management per oncology.

## 2013-02-10 NOTE — Assessment & Plan Note (Signed)
Stable.  Continue advair and prn albuterol. Quitting smoking has really helped her.

## 2013-02-10 NOTE — Assessment & Plan Note (Signed)
Stable. She is not taking protonix regularly.

## 2013-02-10 NOTE — Assessment & Plan Note (Signed)
I have asked her to fax me the copy of her CBC.

## 2013-02-10 NOTE — Assessment & Plan Note (Signed)
Clinically stable. Obtain TSH. Continue synthroid.

## 2013-02-10 NOTE — Patient Instructions (Addendum)
Please return fasting for lab work. You will be contacted about your referral. Follow up in 3 months.

## 2013-02-10 NOTE — Assessment & Plan Note (Signed)
Stable, not currently on meds. Monitor.  

## 2013-02-10 NOTE — Assessment & Plan Note (Signed)
Stable on methotrexate, management per rheumatology.

## 2013-02-10 NOTE — Telephone Encounter (Signed)
Pt will return tomorrow for lab work.  Could you pls call her and arrange nurse visit for Tdap as well.  She is due for that .

## 2013-02-10 NOTE — Assessment & Plan Note (Signed)
She has now quit smoking.  I commended her for this.

## 2013-02-10 NOTE — Assessment & Plan Note (Signed)
Management per rheumatology.  

## 2013-02-10 NOTE — Assessment & Plan Note (Addendum)
Last A1C 5.8. Clinically stable.

## 2013-02-10 NOTE — Progress Notes (Signed)
Subjective:    Patient ID: Sheryl [name], female    DOB: 12-Aug-1946, 67 y.o.   MRN: [id number]  HPI  Subjective:   Patient here for Medicare annual wellness visit and management of other chronic and acute problems.  RA/Sjogrens- follows with Dr. Cardell Peach at [facility]. She had CBC, CMP and ESR drawn yesterday.  She will obtain a copy of her labs for our file.  Reports that she is taking turmeric with black pepper which seems to be helping with her joint pain.   Pernicious anemia- had cbc drawn. She takes folic acid 3 days a week + a multivitamin.  Hypothyroid-  Continuing synthroid.  Reports feeling well on this dose.  She reports some weight gain but reports that she has been overeating and is not active enough.    Hyperlipidemia- She is not currently on a statin.   GERD- reports that she stopped protonix. No complaints.  Depression- Reports that mood is good.   Asthma- On advair and prn albuterol which she reports rarely needed.  She is no longer smoking.    Colon cancer- reports that since this her surgery she has had chronic constipation- she uses water and metamucil which helps. She follows with Dr. Allyne Gee- Heme/onc at [facility] with annual CT's.  Preventative-  Immunizations: Tetanus due Diet: see above.  Exercise: started a pilates program.  Colonoscopy: 10/27/12 Dexa:12/24/10 Pap Smear: 12/29/11 Mammogram: 05/26/12  Vision- deferred had completed 1 month ago. Hearing- grossly intact   Risk factors:  At risk for CAD due to hyperlipidemia  Roster of Physicians Providing Medical Care to Patient:  Dr. Cardell Peach- rheumatology Dr. Allyne Gee- heme onc  Activities of Daily Living  In your present state of health, do you have any difficulty performing the following activities? Preparing food and eating?: No  Bathing yourself: No  Getting dressed: No  Using the toilet:No  Moving around from place to place: No  In the past year have you fallen or had a near fall?:No    Home  Safety: Has smoke detector and wears seat belts. No firearms. No excess sun exposure.  Diet and Exercise  Current exercise habits:  Dietary issues discussed: healthy diet   Depression Screen  (Note: if answer to either of the following is "Yes", then a more complete depression screening is indicated)  Q1: Over the past two weeks, have you felt down, depressed or hopeless?no  Q2: Over the past two weeks, have you felt little interest or pleasure in doing things? no   The following portions of the patient's history were reviewed and updated as appropriate: allergies, current medications, past family history, past medical history, past social history, past surgical history and problem list.    Objective:    Assessment:   Medicare wellness utd on preventive parameters   Plan:    During the course of the visit the patient was educated and counseled about appropriate screening and preventive services including:       Fall prevention   Screening mammography  Bone densitometry screening  Diabetes screening  Nutrition counseling   Vaccines / LABS  Unable to receive zostavax due to methotrexate She will return tomorrow for Tdap.     Patient Instructions (the written plan) was given to the patient.      Review of Systems  Constitutional:       Reports some weight gain which she attributes to large portions and lack of exercise.   Respiratory: Negative for wheezing.   Musculoskeletal: Positive for arthralgias.  Psychiatric/Behavioral:       Denies depression       Objective:   Physical Exam  Constitutional: She is oriented to person, place, and time. She appears well-developed and well-nourished. No distress.  HENT:  Head: Normocephalic and atraumatic.  Cardiovascular: Normal rate and regular rhythm.   No murmur heard. Pulmonary/Chest: Breath sounds normal. No respiratory distress. She has no wheezes. She has no rales. She exhibits no tenderness.  Genitourinary:  Pelvic  deferred Breast exam normal, no masses noted  Musculoskeletal: She exhibits no edema.  Neurological: She is alert and oriented to person, place, and time.  Psychiatric: She has a normal mood and affect. Her behavior is normal. Judgment and thought content normal.          Assessment & Plan:

## 2013-02-11 ENCOUNTER — Ambulatory Visit: Payer: Medicare Other

## 2013-02-14 ENCOUNTER — Ambulatory Visit (INDEPENDENT_AMBULATORY_CARE_PROVIDER_SITE_OTHER): Payer: Medicare Other | Admitting: *Deleted

## 2013-02-14 DIAGNOSIS — E785 Hyperlipidemia, unspecified: Secondary | ICD-10-CM | POA: Diagnosis not present

## 2013-02-14 DIAGNOSIS — Z23 Encounter for immunization: Secondary | ICD-10-CM

## 2013-02-14 DIAGNOSIS — E039 Hypothyroidism, unspecified: Secondary | ICD-10-CM | POA: Diagnosis not present

## 2013-02-14 LAB — HEPATIC FUNCTION PANEL
Albumin: 3.8 g/dL (ref 3.5–5.2)
Alkaline Phosphatase: 79 U/L (ref 39–117)
Indirect Bilirubin: 0.3 mg/dL (ref 0.0–0.9)
Total Bilirubin: 0.4 mg/dL (ref 0.3–1.2)

## 2013-02-14 LAB — LIPID PANEL
Cholesterol: 180 mg/dL (ref 0–200)
HDL: 31 mg/dL — ABNORMAL LOW (ref >39)
Total CHOL/HDL Ratio: 5.8 Ratio

## 2013-02-16 ENCOUNTER — Telehealth: Payer: Self-pay | Admitting: Family

## 2013-02-16 ENCOUNTER — Encounter: Payer: Self-pay | Admitting: Family

## 2013-02-16 DIAGNOSIS — E039 Hypothyroidism, unspecified: Secondary | ICD-10-CM

## 2013-02-16 MED ORDER — OMEGA-3 FATTY ACIDS 1000 MG PO CAPS: 2.0000 g | ORAL_CAPSULE | Freq: Two times a day (BID) | ORAL | Status: DC

## 2013-02-16 NOTE — Telephone Encounter (Signed)
  Triglycerides remain mildly elevated. Please add fish oil 1000mg  caps- take 2 caps twice daily and avoid concentrated sweets.  Also- Thyroid medication is a little strong.  I would like her to cut synthroid in half and take 1/2 tab 2 days a week and full tab all the other days.  We will repeat TSH at her follow up in august.

## 2013-02-17 NOTE — Telephone Encounter (Signed)
Left detailed message on home # re: below instructions and to let us know if she has questions or if she will need Rx sent to pharmacy for levothyroxine with new directions. Future lab order entered.

## 2013-03-03 ENCOUNTER — Other Ambulatory Visit: Payer: Self-pay

## 2013-03-07 ENCOUNTER — Other Ambulatory Visit: Payer: Self-pay | Admitting: Family

## 2013-03-07 NOTE — Telephone Encounter (Signed)
eScribe request for refill on Zolpidem Last filled - 06.16.14, #30x0 eScribe request for refill on Nystatin Suspension Last filled - 03.11.2013, #180x0 [not on active med list] Last AEX - 06.19.14 Next AEX - 3-Mths Please advise on refills/SLS

## 2013-03-07 NOTE — Telephone Encounter (Signed)
OK to send refills as below.  

## 2013-03-08 MED ORDER — NYSTATIN 100000 UNIT/ML MT SUSP: 500000.0000 [IU] | Freq: Four times a day (QID) | OROMUCOSAL | Status: DC

## 2013-03-09 ENCOUNTER — Telehealth: Payer: Self-pay | Admitting: *Deleted

## 2013-03-09 NOTE — Telephone Encounter (Signed)
Received fax from [facility] for prior auth for zolpidem. Pt states she has tried numerous OTC sleep aids such as: benadryl, sleep ease and melatonin. Melatonin helped but caused her to have bad dreams. Other meds did not help. Zolpidem is first Rx she has taken for this. Pt has taken zolpidem since 12/12/10. Form forwarded to Provider for signature

## 2013-03-09 NOTE — Telephone Encounter (Signed)
Signed.

## 2013-03-09 NOTE — Telephone Encounter (Signed)
Rx called to Ben at [facility]. 

## 2013-03-10 NOTE — Telephone Encounter (Signed)
Form faxed to [employer] at [phone]. Awaiting approval / denial.

## 2013-03-10 NOTE — Telephone Encounter (Signed)
Received approval from [facility] approving zolpidem from 03/10/13 through 03/10/14. Notified pharmacist and left detailed message on home# re: authorization.

## 2013-03-14 ENCOUNTER — Telehealth: Payer: Self-pay | Admitting: *Deleted

## 2013-03-14 NOTE — Telephone Encounter (Signed)
Received fax request for refills on pt's synthroid and advair. Pt would like to proceed with [facility] mail service. Pt wants to know if any changes should be made to her thyroid medication, when is she due for another TSH?  Please advise.

## 2013-03-15 NOTE — Telephone Encounter (Signed)
OK to send refills to [place].  We will repeat TSH at her apt in August.

## 2013-03-15 NOTE — Telephone Encounter (Signed)
Refills faxed to [phone] at 11:57am. Notified pt. Also advised pt that we received request for zolpidem from [facility] pharmacy and sent 30 day supply to them. Sheryl [name] is unable to send 90 day supply to mail order. Pt voices understanding.

## 2013-03-21 NOTE — Telephone Encounter (Signed)
Zolpidem rx for 3 month supply was completed by Dr  Abner Greenspan and faxed to [facility] on 03/18/13. Spoke with pt today and confirmed that she is ok to receive 90 day supply at a time. Returned additional fax and left detailed message on [employer] Rx line ([phone]) authorizing 90 day supply and to call if any questions.

## 2013-03-22 DIAGNOSIS — C189 Malignant neoplasm of colon, unspecified: Secondary | ICD-10-CM | POA: Diagnosis not present

## 2013-04-11 DIAGNOSIS — C189 Malignant neoplasm of colon, unspecified: Secondary | ICD-10-CM | POA: Diagnosis not present

## 2013-04-12 ENCOUNTER — Ambulatory Visit: Payer: Medicare Other | Admitting: Family

## 2013-05-13 ENCOUNTER — Ambulatory Visit: Payer: Medicare Other | Admitting: Family

## 2013-05-25 ENCOUNTER — Encounter: Payer: Self-pay | Admitting: Family

## 2013-05-25 ENCOUNTER — Ambulatory Visit (INDEPENDENT_AMBULATORY_CARE_PROVIDER_SITE_OTHER): Payer: Medicare Other | Admitting: Family

## 2013-05-25 ENCOUNTER — Other Ambulatory Visit: Payer: Self-pay | Admitting: Family

## 2013-05-25 VITALS — BP 130/88 | HR 85 | Temp 98.0°F | Resp 16 | Ht 62.75 in | Wt 193.1 lb

## 2013-05-25 DIAGNOSIS — E119 Type 2 diabetes mellitus without complications: Secondary | ICD-10-CM

## 2013-05-25 DIAGNOSIS — E039 Hypothyroidism, unspecified: Secondary | ICD-10-CM | POA: Diagnosis not present

## 2013-05-25 DIAGNOSIS — H109 Unspecified conjunctivitis: Secondary | ICD-10-CM

## 2013-05-25 DIAGNOSIS — E785 Hyperlipidemia, unspecified: Secondary | ICD-10-CM

## 2013-05-25 DIAGNOSIS — M069 Rheumatoid arthritis, unspecified: Secondary | ICD-10-CM

## 2013-05-25 DIAGNOSIS — Z1239 Encounter for other screening for malignant neoplasm of breast: Secondary | ICD-10-CM

## 2013-05-25 DIAGNOSIS — Z1231 Encounter for screening mammogram for malignant neoplasm of breast: Secondary | ICD-10-CM

## 2013-05-25 DIAGNOSIS — Z0000 Encounter for general adult medical examination without abnormal findings: Secondary | ICD-10-CM

## 2013-05-25 DIAGNOSIS — C189 Malignant neoplasm of colon, unspecified: Secondary | ICD-10-CM

## 2013-05-25 LAB — HEMOGLOBIN A1C: Hgb A1c MFr Bld: 6.1 % — ABNORMAL HIGH (ref <5.7)

## 2013-05-25 LAB — BASIC METABOLIC PANEL WITH GFR
BUN: 14 mg/dL (ref 6–23)
CO2: 29 mEq/L (ref 19–32)
Calcium: 9.4 mg/dL (ref 8.4–10.5)
Creat: 0.85 mg/dL (ref 0.50–1.10)
GFR, Est African American: 83 mL/min
GFR, Est Non African American: 72 mL/min
Glucose, Bld: 113 mg/dL — ABNORMAL HIGH (ref 70–99)
Sodium: 138 mEq/L (ref 135–145)

## 2013-05-25 LAB — LIPID PANEL
HDL: 33 mg/dL — ABNORMAL LOW (ref >39)
LDL Cholesterol: 140 mg/dL — ABNORMAL HIGH (ref 0–99)
Total CHOL/HDL Ratio: 6.5 Ratio
Triglycerides: 216 mg/dL — ABNORMAL HIGH (ref <150)
VLDL: 43 mg/dL — ABNORMAL HIGH (ref 0–40)

## 2013-05-25 MED ORDER — NEOMYCIN-POLYMYXIN-HC 3.5-10000-1 OP SUSP: 2.0000 [drp] | Freq: Four times a day (QID) | OPHTHALMIC | Status: DC

## 2013-05-25 MED ORDER — DICLOFENAC SODIUM 1 % TD GEL: 2.0000 g | Freq: Four times a day (QID) | TRANSDERMAL | Status: DC

## 2013-05-25 NOTE — Patient Instructions (Addendum)
Please arrange follow up with your eye doctor if your eyes do not improve in 2-3 days. Please complete lab work prior to leaving. Schedule mammmogram on the first floor. Return in 1 week for flu shot and Prevnar vaccine. Keep your upcoming appointment with Rheumatology. Follow up with [place] in 3 months.

## 2013-05-25 NOTE — Assessment & Plan Note (Addendum)
Trial of cortisporin drops. She understands, especially given RA history, that if symptoms worsen or do not improve with drops, that she needs to be evaluated by her eye doctor.

## 2013-05-25 NOTE — Progress Notes (Signed)
Subjective:    Patient ID: Sheryl [name], female    DOB: [birth year], 67 y.o.   MRN: [id number]  HPI  [name] is a 67 yr old female who presents today for follow up of multiple medical problems.  1) DM2- This is diet controlled.  Last A1C was 6.1.    2) Hypothyroid- currently maintained on levothyroxine .  3) RA/Sjogren's- She follows with Dr. Cardell Peach. Currently maintained on methotrexate.  Has appointment on Friday.  Reports that she has itching on the corners of her eyes.  Wakes up with discharge.  Has had eye  redness.  Has knee pain/ankle pain.  4) Colon CA- she brings with her today a copy of her CT chest/abdomen/pelvis which was performed by Dr. Allyne Gee at [facility] Hem/onc. CT did not show evidence of locally recurrent or metastatic disease.         Review of Systems    see HPI  Past Medical History  Diagnosis Date  . Hyperlipidemia   . Thyroid disease     hypothyroid  . Anemia     pernicious  . Asthma   . Diabetes mellitus     type 2  . Tobacco abuse   . Sjogren's syndrome 03/17/2011  . Colon cancer   . Rheumatoid arthritis(714.0)   . Pericarditis 07/26/2012  . History of pericarditis 07/26/2012    History   Social History  . Marital Status: Single    Spouse Name: N/A    Number of Children: 1  . Years of Education: N/A   Occupational History  . unemployed    Social History Main Topics  . Smoking status: Former Smoker    Types: Cigarettes  . Smokeless tobacco: Not on file     Comment: 3 cigarettes a month. Uses smokeless cigarette.  . Alcohol Use: 0.6 oz/week    1 Glasses of wine per week  . Drug Use: Not on file  . Sexual Activity: Not on file   Other Topics Concern  . Not on file   Social History Narrative   Regular exercise:  Stretching exercises. Resistance bands   Caffeine: 1 mug (2cus) daily.          Past Surgical History  Procedure Laterality Date  . Knee surgery  2006    arthroscopic left knee  . Knee surgery   2001    right knee  . Dilation and curettage of uterus  1975  . Colon surgery  8/.23/13    tumor removed from sigmoid colon.    Family History  Problem Relation Age of Onset  . Heart disease Other     CAD  . Diabetes Other   . Hyperlipidemia Other   . Stroke Other     Allergies  Allergen Reactions  . Ativan [Lorazepam]     Visual hallucinations.   . Cefuroxime Axetil     REACTION: asthma, cough  . Levofloxacin   . Statins     Muscle pain, syncope    Current Outpatient Prescriptions on File Prior to Visit  Medication Sig Dispense Refill  . albuterol (VENTOLIN HFA) 108 (90 BASE) MCG/ACT inhaler Inhale 2 puffs into the lungs every 6 (six) hours as needed.  1 Inhaler  5  . aspirin 81 MG tablet Take 81 mg by mouth daily.        . fish oil-omega-3 fatty acids 1000 MG capsule Take 2 capsules (2 g total) by mouth 2 (two) times daily.      . fluticasone (  FLONASE) 50 MCG/ACT nasal spray Place 2 sprays into the nose daily.  48 g  1  . Fluticasone-Salmeterol (ADVAIR) 250-50 MCG/DOSE AEPB Inhale 1 puff into the lungs 2 (two) times daily as needed.      . furosemide (LASIX) 40 MG tablet Take 40 mg by mouth once a week.       . levothyroxine (SYNTHROID, LEVOTHROID) 100 MCG tablet One tablet by mouth daily 5 days a week and 1/2 tablet by mouth daily 2 days a week.      . methotrexate (RHEUMATREX) 2.5 MG tablet Take 5 tablets once a week.      . multivitamin (THERAGRAN) per tablet Take 1 tablet by mouth daily.        Marland Kitchen nystatin (MYCOSTATIN) 100000 UNIT/ML suspension Take 5 mLs (500,000 Units total) by mouth 4 (four) times daily.  180 mL  0  . pantoprazole (PROTONIX) 40 MG tablet Take 1 tablet (40 mg total) by mouth daily.  90 tablet  1  . zolpidem (AMBIEN) 5 MG tablet Take 1 tablet (5 mg total) by mouth at bedtime as needed for sleep.  30 tablet  0   No current facility-administered medications on file prior to visit.    BP 130/88  Pulse 85  Temp(Src) 98 F (36.7 C) (Oral)  Resp 16   Ht 5' 2.75" (1.594 m)  Wt 193 lb 1.3 oz (87.581 kg)  BMI 34.47 kg/m2  SpO2 99%    Objective:   Physical Exam  Constitutional: She is oriented to person, place, and time. She appears well-developed and well-nourished. No distress.  HENT:  Head: Normocephalic and atraumatic.  No significant eye swelling, redness or discharge noted.  Cardiovascular: Normal rate and regular rhythm.   No murmur heard. Pulmonary/Chest: Effort normal and breath sounds normal. No respiratory distress. She has no wheezes. She has no rales. She exhibits no tenderness.  Musculoskeletal: She exhibits no edema.  Lymphadenopathy:    She has no cervical adenopathy.  Neurological: She is alert and oriented to person, place, and time.  Psychiatric: She has a normal mood and affect. Her behavior is normal. Judgment and thought content normal.          Assessment & Plan:

## 2013-05-25 NOTE — Assessment & Plan Note (Signed)
Deteriorated.  She has follow up scheduled with Rheumatology. Defer management to Dr. Cardell Peach.

## 2013-05-25 NOTE — Assessment & Plan Note (Signed)
Clinically stable on synthroid, continue same, obtain TSH.  

## 2013-05-25 NOTE — Assessment & Plan Note (Signed)
Follow up CT is stable.  Management per oncology.

## 2013-05-25 NOTE — Assessment & Plan Note (Signed)
Clinically stable with diet, check A1C. She wishes to defer flu shot today and will return next week. Eye exam is up to date.

## 2013-05-26 ENCOUNTER — Telehealth: Payer: Self-pay | Admitting: Family

## 2013-05-26 MED ORDER — EZETIMIBE 10 MG PO TABS: 10.0000 mg | ORAL_TABLET | Freq: Every day | ORAL | Status: DC

## 2013-05-26 NOTE — Telephone Encounter (Signed)
Left detailed message on home# and to call if any questions. 

## 2013-05-26 NOTE — Telephone Encounter (Signed)
Please let pt know that her cholesterol has worsened. I recommend that she start zetia once daily. Work hard on low fat/low cholesterol diet, exercise, weight loss.

## 2013-06-01 ENCOUNTER — Telehealth: Payer: Self-pay | Admitting: *Deleted

## 2013-06-01 ENCOUNTER — Ambulatory Visit (INDEPENDENT_AMBULATORY_CARE_PROVIDER_SITE_OTHER): Payer: Medicare Other | Admitting: Family

## 2013-06-01 ENCOUNTER — Ambulatory Visit (HOSPITAL_BASED_OUTPATIENT_CLINIC_OR_DEPARTMENT_OTHER): Payer: Medicare Other

## 2013-06-01 DIAGNOSIS — Z23 Encounter for immunization: Secondary | ICD-10-CM

## 2013-06-01 NOTE — Telephone Encounter (Signed)
Pt came to the office for flu and prevnar injections. Requests that I notify her later of her cholesterol numbers. Released results to "mychart", will send notice through my chart.  Pt is concerned that her labs indicate she needs cholesterol medication again as she reports continued diet and exercise. Pt has not picked up Zetia rx as recommended yet as she has concerns. Pt states she is very weary of taking any medications after her scare with colon cancer. She is concerned that this medication is processed in the colon?  Pt wants to wait and repeat labs in the future.  Please advise.

## 2013-06-02 NOTE — Telephone Encounter (Signed)
I don't think that the zetia will pose risk to her colon.  I think benefits of taking medication outweigh the risks in her case.  However, it is up to her.  If she does not wish to take medication, then I would recommend continued efforts at diet, exercise, weight loss.

## 2013-06-03 NOTE — Telephone Encounter (Signed)
Left detailed message on pts voice mail with [name]'s recommendations.

## 2013-06-08 ENCOUNTER — Ambulatory Visit (INDEPENDENT_AMBULATORY_CARE_PROVIDER_SITE_OTHER): Payer: Medicare Other | Admitting: Physician Assistant

## 2013-06-08 ENCOUNTER — Encounter: Payer: Self-pay | Admitting: Physician Assistant

## 2013-06-08 ENCOUNTER — Ambulatory Visit (HOSPITAL_BASED_OUTPATIENT_CLINIC_OR_DEPARTMENT_OTHER)
Admission: RE | Admit: 2013-06-08 | Discharge: 2013-06-08 | Disposition: A | Discharge status: Home / Self Care | Payer: Medicare Other | Source: Ambulatory Visit | Attending: Family | Admitting: Family

## 2013-06-08 VITALS — BP 126/78 | HR 84 | Temp 98.2°F | Resp 18 | Ht 62.75 in | Wt 191.8 lb

## 2013-06-08 DIAGNOSIS — Z1231 Encounter for screening mammogram for malignant neoplasm of breast: Secondary | ICD-10-CM | POA: Insufficient documentation

## 2013-06-08 DIAGNOSIS — J019 Acute sinusitis, unspecified: Secondary | ICD-10-CM

## 2013-06-08 DIAGNOSIS — H109 Unspecified conjunctivitis: Secondary | ICD-10-CM

## 2013-06-08 MED ORDER — DOXYCYCLINE HYCLATE 100 MG PO TABS: 100.0000 mg | ORAL_TABLET | Freq: Two times a day (BID) | ORAL | Status: DC

## 2013-06-08 MED ORDER — ERYTHROMYCIN 5 MG/GM OP OINT: TOPICAL_OINTMENT | Freq: Four times a day (QID) | OPHTHALMIC | Status: DC

## 2013-06-08 NOTE — Progress Notes (Signed)
Patient ID: Sheryl [name], female   DOB: [birth year], 67 y.o.   MRN: [id number]  Patient presents to clinic today c/o sinus pain, pressure, nasal congestion with green discharge, non-productive cough and ear congestion x 2.5 weeks.  Patient denies fever, chills, shortness of breath.  Has history of asthma and has noticed slight occasional wheeze.  Endorses taking asthma medications as prescribed.  Denies N/V or change in bowel habits.  Patient also complains of red, irritated red eye.  Was seen on 1st by her PCP and diagnosed with conjunctivitis.  Given Rx for cortisporin.  Patient states she has not taken it as prescribed because it made her eyes burn.  Patient denies change in vision.  Has noticed crusting of eyes in the morning with R>L.  Denies itching eyes.     Past Medical History  Diagnosis Date  . Hyperlipidemia   . Thyroid disease     hypothyroid  . Anemia     pernicious  . Asthma   . Diabetes mellitus     type 2  . Tobacco abuse   . Sjogren's syndrome 03/17/2011  . Colon cancer   . Rheumatoid arthritis(714.0)   . Pericarditis 07/26/2012  . History of pericarditis 07/26/2012    Current Outpatient Prescriptions on File Prior to Visit  Medication Sig Dispense Refill  . albuterol (VENTOLIN HFA) 108 (90 BASE) MCG/ACT inhaler Inhale 2 puffs into the lungs every 6 (six) hours as needed.  1 Inhaler  5  . aspirin 81 MG tablet Take 81 mg by mouth daily.        . diclofenac sodium (VOLTAREN) 1 % GEL Apply 2 g topically 4 (four) times daily.  100 g  2  . ezetimibe (ZETIA) 10 MG tablet Take 1 tablet (10 mg total) by mouth daily.  30 tablet  3  . fish oil-omega-3 fatty acids 1000 MG capsule Take 2 capsules (2 g total) by mouth 2 (two) times daily.      . fluticasone (FLONASE) 50 MCG/ACT nasal spray Place 2 sprays into the nose daily.  48 g  1  . Fluticasone-Salmeterol (ADVAIR) 250-50 MCG/DOSE AEPB Inhale 1 puff into the lungs 2 (two) times daily as needed.      . furosemide (LASIX) 40 MG  tablet Take 40 mg by mouth once a week.       . levothyroxine (SYNTHROID, LEVOTHROID) 100 MCG tablet One tablet by mouth daily 5 days a week and 1/2 tablet by mouth daily 2 days a week.      . methotrexate (RHEUMATREX) 2.5 MG tablet Take 5 tablets once a week.      . multivitamin (THERAGRAN) per tablet Take 1 tablet by mouth daily.        Marland Kitchen nystatin (MYCOSTATIN) 100000 UNIT/ML suspension Take 5 mLs (500,000 Units total) by mouth 4 (four) times daily.  180 mL  0  . pantoprazole (PROTONIX) 40 MG tablet Take 1 tablet (40 mg total) by mouth daily.  90 tablet  1  . zolpidem (AMBIEN) 5 MG tablet Take 1 tablet (5 mg total) by mouth at bedtime as needed for sleep.  30 tablet  0   No current facility-administered medications on file prior to visit.    Allergies  Allergen Reactions  . Ativan [Lorazepam]     Visual hallucinations.   . Cefuroxime Axetil     REACTION: asthma, cough  . Levofloxacin   . Statins     Muscle pain, syncope    Family History  Problem Relation Age of Onset  . Heart disease Other     CAD  . Diabetes Other   . Hyperlipidemia Other   . Stroke Other     History   Social History  . Marital Status: Single    Spouse Name: N/A    Number of Children: 1  . Years of Education: N/A   Occupational History  . unemployed    Social History Main Topics  . Smoking status: Former Smoker    Types: Cigarettes  . Smokeless tobacco: None     Comment: 3 cigarettes a month. Uses smokeless cigarette.  . Alcohol Use: 0.6 oz/week    1 Glasses of wine per week  . Drug Use: None  . Sexual Activity: None   Other Topics Concern  . None   Social History Narrative   Regular exercise:  Stretching exercises. Resistance bands   Caffeine: 1 mug (2cus) daily.         Review of Systems  Constitutional: Negative for fever, chills and malaise/fatigue.  HENT: Positive for congestion. Negative for ear discharge and ear pain.   Eyes: Positive for pain, discharge and redness. Negative  for blurred vision, double vision and photophobia.  Respiratory: Positive for cough and wheezing. Negative for sputum production, shortness of breath and stridor.   Gastrointestinal: Negative for nausea, vomiting, abdominal pain, diarrhea and constipation.  Musculoskeletal: Negative for myalgias.  Neurological: Positive for headaches.  Endo/Heme/Allergies: Positive for environmental allergies.   Filed Vitals:   06/08/13 1114  BP: 126/78  Pulse: 84  Temp: 98.2 F (36.8 C)  Resp: 18   Physical Exam  Vitals reviewed. Constitutional: She is oriented to person, place, and time and well-developed, well-nourished, and in no distress.  HENT:  Head: Normocephalic and atraumatic.  Right Ear: External ear normal.  Left Ear: External ear normal.  Nose: Nose normal.  Mouth/Throat: Oropharynx is clear and moist. No oropharyngeal exudate.  TM WNL bilaterally.  Tenderness to percussion over maxillary sinuses bilaterally.  Eyes: EOM are normal. Pupils are equal, round, and reactive to light. Right eye exhibits discharge. Left eye exhibits no discharge.  Erythematous conjunctiva bilaterally.  No cobblestoning noted.  Neck: Neck supple.  Cardiovascular: Normal rate, regular rhythm and normal heart sounds.   Pulmonary/Chest: Effort normal and breath sounds normal. No respiratory distress. She has no wheezes. She has no rales. She exhibits no tenderness.  Lymphadenopathy:    She has no cervical adenopathy.  Neurological: She is alert and oriented to person, place, and time.  Skin: Skin is warm and dry. No rash noted.  Psychiatric: Affect normal.   Recent Results (from the past 2160 hour(s))  BASIC METABOLIC PANEL WITH GFR     Status: Abnormal   Collection Time    05/25/13 11:04 AM      Result Value Range   Sodium 138  135 - 145 mEq/L   Potassium 5.0  3.5 - 5.3 mEq/L   Chloride 103  96 - 112 mEq/L   CO2 29  19 - 32 mEq/L   Glucose, Bld 113 (*) 70 - 99 mg/dL   BUN 14  6 - 23 mg/dL   Creat  3.08  6.57 - 8.46 mg/dL   Calcium 9.4  8.4 - 96.2 mg/dL   GFR, Est African American 83     GFR, Est Non African American 72     Comment:       The estimated GFR is a calculation valid for adults (>=56 years old)  that uses the CKD-EPI algorithm to adjust for age and sex. It is       not to be used for children, pregnant women, hospitalized patients,        patients on dialysis, or with rapidly changing kidney function.     According to the NKDEP, eGFR >89 is normal, 60-89 shows mild     impairment, 30-59 shows moderate impairment, 15-29 shows severe     impairment and <15 is ESRD.        TSH     Status: None   Collection Time    05/25/13 11:04 AM      Result Value Range   TSH 1.179  0.350 - 4.500 uIU/mL  HEMOGLOBIN A1C     Status: Abnormal   Collection Time    05/25/13 11:04 AM      Result Value Range   Hemoglobin A1C 6.1 (*) <5.7 %   Comment:                                                                            According to the ADA Clinical Practice Recommendations for 2011, when     HbA1c is used as a screening test:             >=6.5%   Diagnostic of Diabetes Mellitus                (if abnormal result is confirmed)           5.7-6.4%   Increased risk of developing Diabetes Mellitus           References:Diagnosis and Classification of Diabetes Mellitus,Diabetes     Care,2011,34(Suppl 1):S62-S69 and Standards of Medical Care in             Diabetes - 2011,Diabetes Care,2011,34 (Suppl 1):S11-S61.         Mean Plasma Glucose 128 (*) <117 mg/dL  LIPID PANEL     Status: Abnormal   Collection Time    05/25/13 11:04 AM      Result Value Range   Cholesterol 216 (*) 0 - 200 mg/dL   Comment: ATP III Classification:           < 200        mg/dL        Desirable          200 - 239     mg/dL        Borderline High          >= 240        mg/dL        High         Triglycerides 216 (*) <150 mg/dL   HDL 33 (*) >16 mg/dL   Total CHOL/HDL Ratio 6.5     VLDL 43 (*) 0 - 40  mg/dL   LDL Cholesterol 109 (*) 0 - 99 mg/dL   Comment:       Total Cholesterol/HDL Ratio:CHD Risk                            Coronary Heart Disease Risk Table  Men       Women              1/2 Average Risk              3.4        3.3                  Average Risk              5.0        4.4               2X Average Risk              9.6        7.1               3X Average Risk             23.4       11.0     Use the calculated Patient Ratio above and the CHD Risk table      to determine the patient's CHD Risk.     ATP III Classification (LDL):           < 100        mg/dL         Optimal          100 - 129     mg/dL         Near or Above Optimal          130 - 159     mg/dL         Borderline High          160 - 189     mg/dL         High           > 190        mg/dL         Very High          Assessment/Plan: Acute sinusitis with symptoms > 10 days Given allergies, will Rx doxycycline.  Increase fluids.  Zyrtec.  Probiotic.  Rest.  Humidifier. Saline nasal spray.  Conjunctivitis Rx Erythromycin ointment due to intolerance of last antibiotic.  Given patient's history of RA and Sjogren's, patient advised to schedule appointment with her ophthalmologist.

## 2013-06-08 NOTE — Assessment & Plan Note (Signed)
Rx Erythromycin ointment due to intolerance of last antibiotic.  Given patient's history of RA and Sjogren's, patient advised to schedule appointment with her ophthalmologist.

## 2013-06-08 NOTE — Assessment & Plan Note (Addendum)
Given allergies, will Rx doxycycline.  Increase fluids.  Zyrtec.  Probiotic.  Rest.  Humidifier. Saline nasal spray.

## 2013-06-08 NOTE — Patient Instructions (Signed)
Please take medications as prescribed.  Increase fluid intake.  Daily probiotic Daily claritin.  Saline nasal spray.  Continue with Restasis eye drops.  If eye symptoms are not improving, you need to see your eye doctor.

## 2013-06-14 DIAGNOSIS — M3500 Sicca syndrome, unspecified: Secondary | ICD-10-CM | POA: Diagnosis not present

## 2013-06-14 DIAGNOSIS — M069 Rheumatoid arthritis, unspecified: Secondary | ICD-10-CM | POA: Diagnosis not present

## 2013-06-14 DIAGNOSIS — M1710 Unilateral primary osteoarthritis, unspecified knee: Secondary | ICD-10-CM | POA: Diagnosis not present

## 2013-06-23 ENCOUNTER — Telehealth: Payer: Self-pay | Admitting: *Deleted

## 2013-06-23 MED ORDER — ZOLPIDEM TARTRATE 5 MG PO TABS: 5.0000 mg | ORAL_TABLET | Freq: Every evening | ORAL | Status: DC | PRN

## 2013-06-23 NOTE — Telephone Encounter (Signed)
Rx printed for MD signature; faxed to [facility] Delivery/SLS

## 2013-06-23 NOTE — Telephone Encounter (Signed)
Faxed refill request received from pharmacy for Zolpidem Last filled by MD on 07.14.14, #[id number] Last AEX - 10.01.14 Next AEX - 3 Months Please Advise in PCP absence/SLS

## 2013-06-23 NOTE — Telephone Encounter (Signed)
OK to refill Ambien, with 30 day supply, 1 rf

## 2013-07-04 DIAGNOSIS — C189 Malignant neoplasm of colon, unspecified: Secondary | ICD-10-CM | POA: Diagnosis not present

## 2013-08-31 ENCOUNTER — Ambulatory Visit (INDEPENDENT_AMBULATORY_CARE_PROVIDER_SITE_OTHER): Payer: Medicare Other | Admitting: Family

## 2013-08-31 ENCOUNTER — Encounter: Payer: Self-pay | Admitting: Family

## 2013-08-31 VITALS — BP 120/88 | HR 86 | Temp 97.8°F | Resp 16 | Ht 62.75 in | Wt 192.0 lb

## 2013-08-31 DIAGNOSIS — E039 Hypothyroidism, unspecified: Secondary | ICD-10-CM | POA: Diagnosis not present

## 2013-08-31 DIAGNOSIS — E119 Type 2 diabetes mellitus without complications: Secondary | ICD-10-CM | POA: Diagnosis not present

## 2013-08-31 DIAGNOSIS — J45909 Unspecified asthma, uncomplicated: Secondary | ICD-10-CM | POA: Diagnosis not present

## 2013-08-31 MED ORDER — MONTELUKAST SODIUM 10 MG PO TABS: 10.0000 mg | ORAL_TABLET | Freq: Every day | ORAL | Status: DC

## 2013-08-31 NOTE — Patient Instructions (Addendum)
Please complete your lab work prior to leaving. Follow up in 3 months.   

## 2013-08-31 NOTE — Assessment & Plan Note (Signed)
Clinically stable off of advair. Will add generic singulair for maintenance.

## 2013-08-31 NOTE — Assessment & Plan Note (Signed)
Clinically stable on synthroid. Continue same.  

## 2013-08-31 NOTE — Progress Notes (Signed)
Subjective:    Patient ID: Sheryl [name], female    DOB: November 24, 1945, 68 y.o.   MRN: [id number]  HPI  Sheryl [name] is a 68 yr old female who presents today for follow up.  1) DM-not checking sugars at home.   2) Hypothyroid- on synthroid 159mcg.  3) Asthma- Advair flared her gerd and is too expensive.  Was using advair eery other day and is now off.  Reports that her asthma has been generally well controlled.  Hopes not to have to restart due to cost. Generic meds are free to her.    Review of Systems See HPI  Past Medical History  Diagnosis Date  . Hyperlipidemia   . Thyroid disease     hypothyroid  . Anemia     pernicious  . Asthma   . Diabetes mellitus     type 2  . Tobacco abuse   . Sjogren's syndrome 03/17/2011  . Colon cancer   . Rheumatoid arthritis(714.0)   . Pericarditis 07/26/2012  . History of pericarditis 07/26/2012    History   Social History  . Marital Status: Single    Spouse Name: N/A    Number of Children: 1  . Years of Education: N/A   Occupational History  . unemployed    Social History Main Topics  . Smoking status: Former Smoker    Types: Cigarettes  . Smokeless tobacco: Not on file     Comment: 3 cigarettes a month. Uses smokeless cigarette.  . Alcohol Use: 0.6 oz/week    1 Glasses of wine per week  . Drug Use: Not on file  . Sexual Activity: Not on file   Other Topics Concern  . Not on file   Social History Narrative   Regular exercise:  Stretching exercises. Resistance bands   Caffeine: 1 mug (2cus) daily.          Past Surgical History  Procedure Laterality Date  . Knee surgery  2006    arthroscopic left knee  . Knee surgery  2001    right knee  . Dilation and curettage of uterus  1975  . Colon surgery  8/.23/13    tumor removed from sigmoid colon.    Family History  Problem Relation Age of Onset  . Heart disease Other     CAD  . Diabetes Other   . Hyperlipidemia Other   . Stroke Other     Allergies   Allergen Reactions  . Ativan [Lorazepam]     Visual hallucinations.   . Cefuroxime Axetil     REACTION: asthma, cough  . Levofloxacin   . Statins     Muscle pain, syncope    Current Outpatient Prescriptions on File Prior to Visit  Medication Sig Dispense Refill  . albuterol (VENTOLIN HFA) 108 (90 BASE) MCG/ACT inhaler Inhale 2 puffs into the lungs every 6 (six) hours as needed.  1 Inhaler  5  . aspirin 81 MG tablet Take 81 mg by mouth daily.        . diclofenac sodium (VOLTAREN) 1 % GEL Apply 2 g topically 4 (four) times daily.  100 g  2  . doxycycline (VIBRA-TABS) 100 MG tablet Take 1 tablet (100 mg total) by mouth 2 (two) times daily.  20 tablet  0  . ezetimibe (ZETIA) 10 MG tablet Take 1 tablet (10 mg total) by mouth daily.  30 tablet  3  . fish oil-omega-3 fatty acids 1000 MG capsule Take 2 capsules (2 g  total) by mouth 2 (two) times daily.      . fluticasone (FLONASE) 50 MCG/ACT nasal spray Place 2 sprays into the nose daily.  48 g  1  . furosemide (LASIX) 40 MG tablet Take 40 mg by mouth once a week.       . levothyroxine (SYNTHROID, LEVOTHROID) 100 MCG tablet One tablet by mouth daily 5 days a week and 1/2 tablet by mouth daily 2 days a week.      . methotrexate (RHEUMATREX) 2.5 MG tablet Take 5 tablets once a week.      . multivitamin (THERAGRAN) per tablet Take 1 tablet by mouth daily.        Marland Kitchen nystatin (MYCOSTATIN) 100000 UNIT/ML suspension Take 5 mLs (500,000 Units total) by mouth 4 (four) times daily.  180 mL  0  . pantoprazole (PROTONIX) 40 MG tablet Take 1 tablet (40 mg total) by mouth daily.  90 tablet  1  . zolpidem (AMBIEN) 5 MG tablet Take 1 tablet (5 mg total) by mouth at bedtime as needed for sleep.  30 tablet  1  . Fluticasone-Salmeterol (ADVAIR) 250-50 MCG/DOSE AEPB Inhale 1 puff into the lungs 2 (two) times daily as needed.       No current facility-administered medications on file prior to visit.    BP 120/88  Pulse 86  Temp(Src) 97.8 F (36.6 C) (Oral)   Resp 16  Ht 5' 2.75" (1.594 m)  Wt 192 lb 0.6 oz (87.109 kg)  BMI 34.28 kg/m2  SpO2 97%       Objective:   Physical Exam  Constitutional: She is oriented to person, place, and time. She appears well-developed and well-nourished. No distress.  HENT:  Head: Normocephalic and atraumatic.  Cardiovascular: Normal rate and regular rhythm.   No murmur heard. Pulmonary/Chest: Effort normal and breath sounds normal. No respiratory distress. She has no wheezes. She has no rales. She exhibits no tenderness.  Musculoskeletal: She exhibits no edema.  Neurological: She is alert and oriented to person, place, and time.  Psychiatric: She has a normal mood and affect. Her behavior is normal. Judgment and thought content normal.          Assessment & Plan:

## 2013-08-31 NOTE — Assessment & Plan Note (Signed)
Clinically stable, check a1c

## 2013-08-31 NOTE — Progress Notes (Signed)
Pre visit review using our clinic review tool, if applicable. No additional management support is needed unless otherwise documented below in the visit note. 

## 2013-09-01 ENCOUNTER — Telehealth: Payer: Self-pay

## 2013-09-01 LAB — LIPID PANEL
CHOL/HDL RATIO: 6.5 ratio
Cholesterol: 228 mg/dL — ABNORMAL HIGH (ref 0–200)
HDL: 35 mg/dL — ABNORMAL LOW (ref >39)
LDL CALC: 155 mg/dL — AB (ref 0–99)
Triglycerides: 191 mg/dL — ABNORMAL HIGH (ref <150)
VLDL: 38 mg/dL (ref 0–40)

## 2013-09-01 LAB — HEPATIC FUNCTION PANEL
ALT: 74 U/L — ABNORMAL HIGH (ref 0–35)
AST: 79 U/L — ABNORMAL HIGH (ref 0–37)
Albumin: 4 g/dL (ref 3.5–5.2)
Alkaline Phosphatase: 81 U/L (ref 39–117)
BILIRUBIN DIRECT: 0.1 mg/dL (ref 0.0–0.3)
BILIRUBIN TOTAL: 0.5 mg/dL (ref 0.3–1.2)
Indirect Bilirubin: 0.4 mg/dL (ref 0.0–0.9)
Total Protein: 7.8 g/dL (ref 6.0–8.3)

## 2013-09-01 NOTE — Telephone Encounter (Signed)
Relevant patient education assigned to patient using Emmi. ° °

## 2013-09-05 ENCOUNTER — Telehealth: Payer: Self-pay | Admitting: Family

## 2013-09-05 NOTE — Telephone Encounter (Signed)
Reviewed lab work, cholesterol remains elevated.  Liver function testing remains elevated.  This is likely due to fatty liver which was noted on abdominal US previously.  Low fat/low cholesterol diet diet, exercise, weight loss can help. Continue fish oil.

## 2013-09-05 NOTE — Telephone Encounter (Signed)
Left detailed message on home# and to call if any questions. 

## 2013-09-07 ENCOUNTER — Telehealth: Payer: Self-pay | Admitting: *Deleted

## 2013-09-07 MED ORDER — ZOLPIDEM TARTRATE 5 MG PO TABS: 5.0000 mg | ORAL_TABLET | Freq: Every evening | ORAL | Status: DC | PRN

## 2013-09-07 NOTE — Telephone Encounter (Signed)
Received fax from [facility] for refill of zolpidem.  Rx completed for #90 x zero refills and faxed back to [phone].

## 2013-09-12 ENCOUNTER — Telehealth: Payer: Self-pay

## 2013-09-12 MED ORDER — LEVOTHYROXINE SODIUM 100 MCG PO TABS: ORAL_TABLET | ORAL | Status: DC

## 2013-09-12 MED ORDER — ALBUTEROL SULFATE HFA 108 (90 BASE) MCG/ACT IN AERS: 2.0000 | INHALATION_SPRAY | Freq: Four times a day (QID) | RESPIRATORY_TRACT | Status: DC | PRN

## 2013-09-12 MED ORDER — ZOLPIDEM TARTRATE 5 MG PO TABS: 5.0000 mg | ORAL_TABLET | Freq: Every evening | ORAL | Status: DC | PRN

## 2013-09-12 NOTE — Telephone Encounter (Signed)
Spoke with Elmyra Ricks at [facility] and she verified receipt of zolpidem Rx on 09/07/13 and states it has been shipped to pt and is scheduled to arrive on 09/13/13. Also verified receipt of levothyroxine and and albuterol today.

## 2013-09-12 NOTE — Telephone Encounter (Signed)
Spoke with pt. She requests 30 day supply to local pharmacy and 90 day supply to [facility] as well. Advised her that Ambien was requested and faxed back to mail order on 09/07/13. Did not receive other requests. 30 day supply sent to [facility] main [place] per pt request. 90 day supply to mail order as well.

## 2013-09-12 NOTE — Telephone Encounter (Signed)
The patient called hoping to get refills on the following meds:  Albuterol  Levothyroxine  ambien   Pt callback - [phone]

## 2013-09-23 ENCOUNTER — Ambulatory Visit (INDEPENDENT_AMBULATORY_CARE_PROVIDER_SITE_OTHER): Payer: Medicare Other | Admitting: Family

## 2013-09-23 ENCOUNTER — Encounter: Payer: Self-pay | Admitting: Family

## 2013-09-23 VITALS — BP 122/80 | HR 91 | Temp 97.6°F | Resp 18 | Ht 62.75 in | Wt 194.0 lb

## 2013-09-23 DIAGNOSIS — J209 Acute bronchitis, unspecified: Secondary | ICD-10-CM | POA: Diagnosis not present

## 2013-09-23 DIAGNOSIS — J40 Bronchitis, not specified as acute or chronic: Secondary | ICD-10-CM | POA: Insufficient documentation

## 2013-09-23 MED ORDER — BUDESONIDE-FORMOTEROL FUMARATE 160-4.5 MCG/ACT IN AERO: 2.0000 | INHALATION_SPRAY | Freq: Two times a day (BID) | RESPIRATORY_TRACT | Status: DC

## 2013-09-23 MED ORDER — AZITHROMYCIN 250 MG PO TABS: ORAL_TABLET | ORAL | Status: DC

## 2013-09-23 MED ORDER — FLUTICASONE-SALMETEROL 250-50 MCG/DOSE IN AEPB: 1.0000 | INHALATION_SPRAY | Freq: Two times a day (BID) | RESPIRATORY_TRACT | Status: DC | PRN

## 2013-09-23 MED ORDER — PREDNISONE 10 MG PO TABS: ORAL_TABLET | ORAL | Status: DC

## 2013-09-23 NOTE — Assessment & Plan Note (Signed)
Will rx with zpak, prednisone, symbicort- sample provided.  Pt instructed to follow up if symptoms worsen or if symptoms do not improve in 2-3 days.

## 2013-09-23 NOTE — Patient Instructions (Signed)
Please start zithromax, prednisone taper and symbicort. Call if symptoms worsen or if not improved in 2-3 days.

## 2013-09-23 NOTE — Progress Notes (Signed)
Pre visit review using our clinic review tool, if applicable. No additional management support is needed unless otherwise documented below in the visit note. 

## 2013-09-23 NOTE — Progress Notes (Signed)
Subjective:    Patient ID: Sheryl [name], female    DOB: 06/30/1946, 68 y.o.   MRN: [id number]  HPI  [name] is a 68 yr old female who presents today with chief complaint of cough. Cough is described as productive. Worse at night. Eating increases her mucous.  Her ears are draining a little bit.  + associated wheezing, no associated fever. She had some symbicort on hand that she started a few days ago.   Review of Systems    see HPI  Past Medical History  Diagnosis Date  . Hyperlipidemia   . Thyroid disease     hypothyroid  . Anemia     pernicious  . Asthma   . Diabetes mellitus     type 2  . Tobacco abuse   . Sjogren's syndrome 03/17/2011  . Colon cancer   . Rheumatoid arthritis(714.0)   . Pericarditis 07/26/2012  . History of pericarditis 07/26/2012    History   Social History  . Marital Status: Single    Spouse Name: N/A    Number of Children: 1  . Years of Education: N/A   Occupational History  . unemployed    Social History Main Topics  . Smoking status: Former Smoker    Types: Cigarettes  . Smokeless tobacco: Not on file     Comment: 3 cigarettes a month. Uses smokeless cigarette.  . Alcohol Use: 0.6 oz/week    1 Glasses of wine per week  . Drug Use: Not on file  . Sexual Activity: Not on file   Other Topics Concern  . Not on file   Social History Narrative   Regular exercise:  Stretching exercises. Resistance bands   Caffeine: 1 mug (2cus) daily.          Past Surgical History  Procedure Laterality Date  . Knee surgery  2006    arthroscopic left knee  . Knee surgery  2001    right knee  . Dilation and curettage of uterus  1975  . Colon surgery  8/.23/13    tumor removed from sigmoid colon.    Family History  Problem Relation Age of Onset  . Heart disease Other     CAD  . Diabetes Other   . Hyperlipidemia Other   . Stroke Other     Allergies  Allergen Reactions  . Ativan [Lorazepam]     Visual hallucinations.   .  Cefuroxime Axetil     REACTION: asthma, cough  . Levofloxacin   . Statins     Muscle pain, syncope    Current Outpatient Prescriptions on File Prior to Visit  Medication Sig Dispense Refill  . albuterol (VENTOLIN HFA) 108 (90 BASE) MCG/ACT inhaler Inhale 2 puffs into the lungs every 6 (six) hours as needed.  3 Inhaler  1  . aspirin 81 MG tablet Take 81 mg by mouth daily.        . diclofenac sodium (VOLTAREN) 1 % GEL Apply 2 g topically 4 (four) times daily.  100 g  2  . fish oil-omega-3 fatty acids 1000 MG capsule Take 2 capsules (2 g total) by mouth 2 (two) times daily.      . fluticasone (FLONASE) 50 MCG/ACT nasal spray Place 2 sprays into the nose daily.  48 g  1  . Fluticasone-Salmeterol (ADVAIR) 250-50 MCG/DOSE AEPB Inhale 1 puff into the lungs 2 (two) times daily as needed.      Marland Kitchen levothyroxine (SYNTHROID, LEVOTHROID) 100 MCG tablet One  tablet by mouth daily 5 days a week and 1/2 tablet by mouth daily 2 days a week. Until mail order arrives.  90 tablet  1  . methotrexate (RHEUMATREX) 2.5 MG tablet Take 5 tablets once a week.      . montelukast (SINGULAIR) 10 MG tablet Take 1 tablet (10 mg total) by mouth at bedtime.  90 tablet  1  . multivitamin (THERAGRAN) per tablet Take 1 tablet by mouth daily.        Marland Kitchen nystatin (MYCOSTATIN) 100000 UNIT/ML suspension Take 5 mLs (500,000 Units total) by mouth 4 (four) times daily.  180 mL  0  . pantoprazole (PROTONIX) 40 MG tablet Take 1 tablet (40 mg total) by mouth daily.  90 tablet  1  . zolpidem (AMBIEN) 5 MG tablet Take 1 tablet (5 mg total) by mouth at bedtime as needed for sleep.  30 tablet  0   No current facility-administered medications on file prior to visit.    BP 122/80  Pulse 91  Temp(Src) 97.6 F (36.4 C) (Oral)  Resp 18  Ht 5' 2.75" (1.594 m)  Wt 194 lb (87.998 kg)  BMI 34.63 kg/m2  SpO2 99%    Objective:   Physical Exam  Constitutional: She is oriented to person, place, and time. She appears well-developed and  well-nourished. No distress.  HENT:  Head: Normocephalic and atraumatic.  Right Ear: Tympanic membrane and ear canal normal.  Left Ear: Tympanic membrane and ear canal normal.  Mouth/Throat: No oropharyngeal exudate, posterior oropharyngeal edema or posterior oropharyngeal erythema.  Cardiovascular: Normal rate and regular rhythm.   No murmur heard. Pulmonary/Chest: Effort normal. No respiratory distress. She has wheezes. She has no rales. She exhibits no tenderness.  Neurological: She is alert and oriented to person, place, and time.  Psychiatric: She has a normal mood and affect. Her behavior is normal. Judgment and thought content normal.          Assessment & Plan:

## 2013-10-31 DIAGNOSIS — C189 Malignant neoplasm of colon, unspecified: Secondary | ICD-10-CM | POA: Diagnosis not present

## 2013-11-01 ENCOUNTER — Telehealth: Payer: Self-pay | Admitting: *Deleted

## 2013-11-01 MED ORDER — ZOLPIDEM TARTRATE 5 MG PO TABS: 5.0000 mg | ORAL_TABLET | Freq: Every evening | ORAL | Status: DC | PRN

## 2013-11-01 MED ORDER — DICLOFENAC SODIUM 1 % TD GEL: 2.0000 g | Freq: Four times a day (QID) | TRANSDERMAL | Status: DC

## 2013-11-01 NOTE — Telephone Encounter (Signed)
Pt left message on voicemail that her refills have expired for zolpidem and diclofenac. Refills sent. Notified pt.

## 2013-11-04 ENCOUNTER — Telehealth: Payer: Self-pay | Admitting: *Deleted

## 2013-11-04 ENCOUNTER — Telehealth: Payer: Self-pay | Admitting: Family

## 2013-11-04 DIAGNOSIS — M069 Rheumatoid arthritis, unspecified: Secondary | ICD-10-CM

## 2013-11-04 MED ORDER — DICLOFENAC SODIUM 75 MG PO TBEC: 75.0000 mg | DELAYED_RELEASE_TABLET | Freq: Two times a day (BID) | ORAL | Status: DC | PRN

## 2013-11-04 NOTE — Telephone Encounter (Signed)
Rx sent for diclofenac tabs.  Please advise pt to use sparingly. Use tylenol first, diclofenac for breakthrough pain.

## 2013-11-04 NOTE — Telephone Encounter (Signed)
Notified pt. She states she has been using the gel more than the tablets as the tablets seem to make her drowsy and states she only uses the tablets for breakthrough pain. Sometimes takes the tablets at night when the pain keeps her awake. Thinks she had rx from another doctor previously that she was using. Pt also states that her rheumatologist has left and she is needing referral to another rheumatologist in the [place] area.  Please advise.

## 2013-11-04 NOTE — Telephone Encounter (Signed)
Sheryl [name],  Diclofenac gel is what was currently listed on her med list so that was what I refilled when she requested it on 11/01/13.  Is it ok to send tablets instead?

## 2013-11-04 NOTE — Telephone Encounter (Signed)
HER AMBIEN WAS CALLED IN AND THAT IS FINE BUT SHE WAS CALLED IN A GEL AND SHE NEEDS THE PILL IN THAT MED

## 2013-11-04 NOTE — Telephone Encounter (Signed)
    Ronny Flurry, CMA at 11/04/2013 5:14 PM     Status: Signed        Notified pt. She states she has been using the gel more than the tablets as the tablets seem to make her drowsy and states she only uses the tablets for breakthrough pain. Sometimes takes the tablets at night when the pain keeps her awake. Thinks she had rx from another doctor previously that she was using. Pt also states that her rheumatologist has left and she is needing referral to another rheumatologist in the [place] area. Please advise.        [name], NP at 11/04/2013 2:26 PM     Status: Signed        Rx sent for diclofenac tabs. Please advise pt to use sparingly. Use tylenol first, diclofenac for breakthrough pain.         Ronny Flurry, CMA at 11/04/2013 1:44 PM     Status: Signed        [name],  Diclofenac gel is what was currently listed on her med list so that was what I refilled when she requested it on 11/01/13. Is it ok to send tablets instead?        Elveria Royals at 11/04/2013 1:19 PM     Status: Signed        HER AMBIEN WAS CALLED IN AND THAT IS FINE BUT SHE WAS CALLED IN A GEL AND SHE NEEDS THE PILL IN THAT MED

## 2013-11-18 ENCOUNTER — Telehealth: Payer: Self-pay | Admitting: Family

## 2013-11-18 NOTE — Telephone Encounter (Signed)
Patient has requested her scripts for  Ambien, Prednisone and Symbicort  be re-written for 3 month supply, she does not have to pay a copay with [facility] if her medications are written for 3 month supply. She also would like to know if it is OK to take the Symbicort and singulair together?

## 2013-11-18 NOTE — Telephone Encounter (Signed)
Rx request forms in folder for Provider signature. Please advise re: additional question below.

## 2013-11-19 NOTE — Telephone Encounter (Signed)
Ambien will need to be written by Dr. Charlett Blake if she is agreeable to do so.  Will address additional concerns upon my return in 1 week.

## 2013-11-21 MED ORDER — BUDESONIDE-FORMOTEROL FUMARATE 160-4.5 MCG/ACT IN AERO: 2.0000 | INHALATION_SPRAY | Freq: Two times a day (BID) | RESPIRATORY_TRACT | Status: DC

## 2013-11-21 NOTE — Telephone Encounter (Signed)
Spoke with pt and she states that her previous doctor in [place] would giver her a 3 month standing order for Prednisone to keep on hand in case of an arthritis flare up. Pt states that when it flared up she would call her doctor and be instructed on how to dose the Prednisone but she had it on hand in case she was too sick to go out and get medication. Advised pt this would be addressed next week upon Provider's return. Ambien forwarded to Dr Charlett Blake for signature. Faxed refill for symbicort to [facility]. Pt voices understanding.

## 2013-11-22 MED ORDER — ZOLPIDEM TARTRATE 5 MG PO TABS: 5.0000 mg | ORAL_TABLET | Freq: Every evening | ORAL | Status: DC | PRN

## 2013-11-22 NOTE — Telephone Encounter (Signed)
Ambien Rx faxed to [facility] at [phone].

## 2013-11-24 ENCOUNTER — Telehealth: Payer: Self-pay | Admitting: Family

## 2013-11-24 NOTE — Telephone Encounter (Signed)
Denial form received, forward to nurse

## 2013-11-28 NOTE — Telephone Encounter (Signed)
PA request for zolpidem has been updated and resubmitted to [facility] for further consideration.

## 2013-11-29 NOTE — Telephone Encounter (Signed)
Received approval from [facility] via covermy meds. Notified pt.

## 2013-11-30 ENCOUNTER — Encounter: Payer: Self-pay | Admitting: Family

## 2013-11-30 ENCOUNTER — Ambulatory Visit (INDEPENDENT_AMBULATORY_CARE_PROVIDER_SITE_OTHER): Payer: Medicare Other | Admitting: Family

## 2013-11-30 VITALS — BP 118/80 | HR 83 | Temp 98.3°F | Resp 16 | Ht 62.75 in | Wt 196.1 lb

## 2013-11-30 DIAGNOSIS — E119 Type 2 diabetes mellitus without complications: Secondary | ICD-10-CM | POA: Diagnosis not present

## 2013-11-30 DIAGNOSIS — J209 Acute bronchitis, unspecified: Secondary | ICD-10-CM | POA: Diagnosis not present

## 2013-11-30 DIAGNOSIS — J45909 Unspecified asthma, uncomplicated: Secondary | ICD-10-CM | POA: Diagnosis not present

## 2013-11-30 DIAGNOSIS — M069 Rheumatoid arthritis, unspecified: Secondary | ICD-10-CM

## 2013-11-30 DIAGNOSIS — E039 Hypothyroidism, unspecified: Secondary | ICD-10-CM | POA: Diagnosis not present

## 2013-11-30 LAB — HEMOGLOBIN A1C
HEMOGLOBIN A1C: 6.4 % — AB (ref <5.7)
MEAN PLASMA GLUCOSE: 137 mg/dL — AB (ref <117)

## 2013-11-30 LAB — BASIC METABOLIC PANEL WITH GFR
BUN: 12 mg/dL (ref 6–23)
CO2: 24 mEq/L (ref 19–32)
Calcium: 8.9 mg/dL (ref 8.4–10.5)
Chloride: 99 mEq/L (ref 96–112)
Creat: 0.77 mg/dL (ref 0.50–1.10)
GFR, EST NON AFRICAN AMERICAN: 81 mL/min
Glucose, Bld: 92 mg/dL (ref 70–99)
POTASSIUM: 4.5 meq/L (ref 3.5–5.3)
Sodium: 133 mEq/L — ABNORMAL LOW (ref 135–145)

## 2013-11-30 LAB — TSH: TSH: 2.099 u[IU]/mL (ref 0.350–4.500)

## 2013-11-30 NOTE — Assessment & Plan Note (Signed)
Clinically stable on synthroid, obtain follow up TSH.  

## 2013-11-30 NOTE — Assessment & Plan Note (Addendum)
Stable with diet control. Obtain a1C, urine microalbumin.  Consider addition of ACE inhibitor next visit.

## 2013-11-30 NOTE — Progress Notes (Signed)
Pre visit review using our clinic review tool, if applicable. No additional management support is needed unless otherwise documented below in the visit note. 

## 2013-11-30 NOTE — Progress Notes (Signed)
Subjective:    Patient ID: Sheryl [name], female    DOB: [birth year], 68 y.o.   MRN: [id number]  HPI  [name] is a 68 yr old female who presents today for follow up.  1) DM2- diet controlled. Last A1C was 6.1. She does not check sugars at home.    2) Hypothyroid- maintianed on synthroid.    3) Asthma- maintained on symbicort, singulair, albuterol. Reports recent worsening of symptoms due to allergies.   4) Foot swelling- reports bad arthritis in the left 3 toes.    5) RA-She is now following with Dr. Abner Greenspan.  He moved location.      Review of Systems See HPI  Past Medical History  Diagnosis Date  . Hyperlipidemia   . Thyroid disease     hypothyroid  . Anemia     pernicious  . Asthma   . Diabetes mellitus     type 2  . Tobacco abuse   . Sjogren's syndrome 03/17/2011  . Colon cancer   . Rheumatoid arthritis(714.0)   . Pericarditis 07/26/2012  . History of pericarditis 07/26/2012    History   Social History  . Marital Status: Single    Spouse Name: N/A    Number of Children: 1  . Years of Education: N/A   Occupational History  . unemployed    Social History Main Topics  . Smoking status: Former Smoker    Types: Cigarettes  . Smokeless tobacco: Not on file     Comment: 3 cigarettes a month. Uses smokeless cigarette.  . Alcohol Use: 0.6 oz/week    1 Glasses of wine per week  . Drug Use: Not on file  . Sexual Activity: Not on file   Other Topics Concern  . Not on file   Social History Narrative   Regular exercise:  Stretching exercises. Resistance bands   Caffeine: 1 mug (2cus) daily.          Past Surgical History  Procedure Laterality Date  . Knee surgery  2006    arthroscopic left knee  . Knee surgery  2001    right knee  . Dilation and curettage of uterus  1975  . Colon surgery  8/.23/13    tumor removed from sigmoid colon.    Family History  Problem Relation Age of Onset  . Heart disease Other     CAD  . Diabetes Other     . Hyperlipidemia Other   . Stroke Other     Allergies  Allergen Reactions  . Ativan [Lorazepam]     Visual hallucinations.   . Cefuroxime Axetil     REACTION: asthma, cough  . Levofloxacin   . Statins     Muscle pain, syncope    Current Outpatient Prescriptions on File Prior to Visit  Medication Sig Dispense Refill  . albuterol (VENTOLIN HFA) 108 (90 BASE) MCG/ACT inhaler Inhale 2 puffs into the lungs every 6 (six) hours as needed.  3 Inhaler  1  . aspirin 81 MG tablet Take 81 mg by mouth daily.        . budesonide-formoterol (SYMBICORT) 160-4.5 MCG/ACT inhaler Inhale 2 puffs into the lungs 2 (two) times daily.  3 Inhaler  1  . diclofenac (VOLTAREN) 75 MG EC tablet Take 1 tablet (75 mg total) by mouth 2 (two) times daily as needed.  30 tablet  0  . fish oil-omega-3 fatty acids 1000 MG capsule Take 2 capsules (2 g total) by mouth 2 (two) times  daily.      . fluticasone (FLONASE) 50 MCG/ACT nasal spray Place 2 sprays into the nose daily.  48 g  1  . levothyroxine (SYNTHROID, LEVOTHROID) 100 MCG tablet One tablet by mouth daily 5 days a week and 1/2 tablet by mouth daily 2 days a week. Until mail order arrives.  90 tablet  1  . methotrexate (RHEUMATREX) 2.5 MG tablet Take 5 tablets once a week.      . montelukast (SINGULAIR) 10 MG tablet Take 1 tablet (10 mg total) by mouth at bedtime.  90 tablet  1  . multivitamin (THERAGRAN) per tablet Take 1 tablet by mouth daily.        Marland Kitchen nystatin (MYCOSTATIN) 100000 UNIT/ML suspension Take 5 mLs (500,000 Units total) by mouth 4 (four) times daily.  180 mL  0  . pantoprazole (PROTONIX) 40 MG tablet Take 1 tablet (40 mg total) by mouth daily.  90 tablet  1  . predniSONE (DELTASONE) 10 MG tablet 4 tabs by mouth once daily x 2 days, then 3 tabs daily x 2 days, then 2 tabs daily x 2 days then 1 tab daily x 2 days then stop  20 tablet  0  . zolpidem (AMBIEN) 5 MG tablet Take 1 tablet (5 mg total) by mouth at bedtime as needed for sleep.  90 tablet  1    No current facility-administered medications on file prior to visit.    BP 118/80  Pulse 83  Temp(Src) 98.3 F (36.8 C) (Oral)  Resp 16  Ht 5' 2.75" (1.594 m)  Wt 196 lb 1.3 oz (88.941 kg)  BMI 35.00 kg/m2  SpO2 99%       Objective:   Physical Exam  Constitutional: She is oriented to person, place, and time. She appears well-developed and well-nourished. No distress.  HENT:  Head: Normocephalic and atraumatic.  Cardiovascular: Normal rate and regular rhythm.   No murmur heard. Pulmonary/Chest: Effort normal and breath sounds normal. No respiratory distress. She has no wheezes. She has no rales. She exhibits no tenderness.  Musculoskeletal: She exhibits no edema.  Neurological: She is alert and oriented to person, place, and time.  Skin:  Slightly violaceous color to left 2nd, 3rd, and 4th toes.  These toes are warm with brisk cap refill. 2+ bilateral DP/PT pulses bilaterally.   Psychiatric: She has a normal mood and affect. Her behavior is normal. Judgment and thought content normal.          Assessment & Plan:

## 2013-11-30 NOTE — Assessment & Plan Note (Signed)
Fair control. Continue Symbicort, singulair, albuterol.

## 2013-11-30 NOTE — Assessment & Plan Note (Signed)
Has appointment with new rheumatologist.  Management per rheumatology.

## 2013-11-30 NOTE — Patient Instructions (Signed)
Please complete lab work prior to leaving. Follow up in 3 months.  

## 2013-12-01 LAB — MICROALBUMIN / CREATININE URINE RATIO
Creatinine, Urine: 69.8 mg/dL
Microalb Creat Ratio: 7.2 mg/g (ref 0.0–30.0)
Microalb, Ur: 0.5 mg/dL (ref 0.00–1.89)

## 2013-12-02 ENCOUNTER — Telehealth: Payer: Self-pay | Admitting: Family

## 2013-12-02 ENCOUNTER — Encounter: Payer: Self-pay | Admitting: Family

## 2013-12-02 DIAGNOSIS — E871 Hypo-osmolality and hyponatremia: Secondary | ICD-10-CM

## 2013-12-02 NOTE — Telephone Encounter (Signed)
Notified pt and she voices understanding. 

## 2013-12-02 NOTE — Telephone Encounter (Signed)
Sugar is at goal.  Urine testing is negative for abnormal amounts of protein. Thyroid function looks good on current dose of synthroid- please continue same. Sodium is mildly low.  Please repeat bmet in 2 weeks. Dx hyponatremia.

## 2013-12-07 DIAGNOSIS — K5900 Constipation, unspecified: Secondary | ICD-10-CM | POA: Diagnosis not present

## 2013-12-07 DIAGNOSIS — Z85038 Personal history of other malignant neoplasm of large intestine: Secondary | ICD-10-CM | POA: Diagnosis not present

## 2013-12-07 DIAGNOSIS — Z1211 Encounter for screening for malignant neoplasm of colon: Secondary | ICD-10-CM | POA: Diagnosis not present

## 2014-01-12 DIAGNOSIS — Z1211 Encounter for screening for malignant neoplasm of colon: Secondary | ICD-10-CM | POA: Diagnosis not present

## 2014-01-12 DIAGNOSIS — Z85038 Personal history of other malignant neoplasm of large intestine: Secondary | ICD-10-CM | POA: Diagnosis not present

## 2014-01-12 DIAGNOSIS — D126 Benign neoplasm of colon, unspecified: Secondary | ICD-10-CM | POA: Diagnosis not present

## 2014-01-24 ENCOUNTER — Telehealth: Payer: Self-pay | Admitting: *Deleted

## 2014-01-24 MED ORDER — PANTOPRAZOLE SODIUM 40 MG PO TBEC: 40.0000 mg | DELAYED_RELEASE_TABLET | Freq: Every day | ORAL | Status: DC

## 2014-01-24 MED ORDER — LEVOTHYROXINE SODIUM 100 MCG PO TABS: ORAL_TABLET | ORAL | Status: DC

## 2014-01-24 MED ORDER — ALBUTEROL SULFATE HFA 108 (90 BASE) MCG/ACT IN AERS: 2.0000 | INHALATION_SPRAY | Freq: Four times a day (QID) | RESPIRATORY_TRACT | Status: DC | PRN

## 2014-01-24 NOTE — Telephone Encounter (Signed)
Received fax from [facility] requesting refills on:  Pantoprazole, proair and levothyroxine. Refills sent.

## 2014-01-25 ENCOUNTER — Telehealth: Payer: Self-pay | Admitting: *Deleted

## 2014-01-25 NOTE — Telephone Encounter (Signed)
Rx was sent electronically on 01/24/14 per pt's request. Current request for levothyroxine refill discarded.

## 2014-01-31 ENCOUNTER — Other Ambulatory Visit: Payer: Self-pay | Admitting: Family

## 2014-02-03 DIAGNOSIS — E871 Hypo-osmolality and hyponatremia: Secondary | ICD-10-CM | POA: Diagnosis not present

## 2014-02-03 LAB — BASIC METABOLIC PANEL
BUN: 12 mg/dL (ref 6–23)
CALCIUM: 9 mg/dL (ref 8.4–10.5)
CO2: 27 meq/L (ref 19–32)
CREATININE: 0.87 mg/dL (ref 0.50–1.10)
Chloride: 99 mEq/L (ref 96–112)
Glucose, Bld: 110 mg/dL — ABNORMAL HIGH (ref 70–99)
Potassium: 4.5 mEq/L (ref 3.5–5.3)
Sodium: 136 mEq/L (ref 135–145)

## 2014-02-17 DIAGNOSIS — Z85038 Personal history of other malignant neoplasm of large intestine: Secondary | ICD-10-CM | POA: Diagnosis not present

## 2014-02-17 DIAGNOSIS — C189 Malignant neoplasm of colon, unspecified: Secondary | ICD-10-CM | POA: Diagnosis not present

## 2014-03-08 ENCOUNTER — Telehealth: Payer: Self-pay

## 2014-03-08 DIAGNOSIS — E785 Hyperlipidemia, unspecified: Secondary | ICD-10-CM

## 2014-03-08 NOTE — Telephone Encounter (Signed)
Diabetic bundle:  I left a detailed message on patients vm stating that we need her to come in and get her LDL checked at her earliest convenience.  Lab order placed

## 2014-03-10 ENCOUNTER — Ambulatory Visit (INDEPENDENT_AMBULATORY_CARE_PROVIDER_SITE_OTHER): Payer: Medicare Other | Admitting: Family

## 2014-03-10 ENCOUNTER — Encounter: Payer: Self-pay | Admitting: Family

## 2014-03-10 ENCOUNTER — Other Ambulatory Visit: Payer: Self-pay | Admitting: Family

## 2014-03-10 VITALS — BP 102/80 | HR 78 | Temp 97.9°F | Resp 18 | Ht 62.75 in

## 2014-03-10 DIAGNOSIS — E119 Type 2 diabetes mellitus without complications: Secondary | ICD-10-CM

## 2014-03-10 DIAGNOSIS — E039 Hypothyroidism, unspecified: Secondary | ICD-10-CM

## 2014-03-10 DIAGNOSIS — M069 Rheumatoid arthritis, unspecified: Secondary | ICD-10-CM | POA: Diagnosis not present

## 2014-03-10 DIAGNOSIS — B370 Candidal stomatitis: Secondary | ICD-10-CM

## 2014-03-10 DIAGNOSIS — R7309 Other abnormal glucose: Secondary | ICD-10-CM

## 2014-03-10 DIAGNOSIS — K140 Glossitis: Secondary | ICD-10-CM

## 2014-03-10 DIAGNOSIS — D510 Vitamin B12 deficiency anemia due to intrinsic factor deficiency: Secondary | ICD-10-CM | POA: Diagnosis not present

## 2014-03-10 DIAGNOSIS — R7303 Prediabetes: Secondary | ICD-10-CM

## 2014-03-10 NOTE — Assessment & Plan Note (Signed)
Check A1c. She is exercising. She does eat sweets but also eats plenty of fruits/vegetables.

## 2014-03-10 NOTE — Patient Instructions (Addendum)
Please complete lab work prior to leaving. Start nystatin for thrush. Follow up in 3 months.

## 2014-03-10 NOTE — Assessment & Plan Note (Addendum)
White coating noted on tongue. Tongue also looks smooth and red. Check B12/folate. Use nystatin as needed.

## 2014-03-10 NOTE — Progress Notes (Signed)
Subjective:    Patient ID: Sheryl [name], female    DOB: 1945/12/01, 68 y.o.   MRN: [id number]  HPI  Sheryl [name] is a 68 yr old female who presents today for  3 month follow up.  Hyperglycemia -  States she feels flushed after eating occasioanally- feels like "body is adjusting to what she ate". Feels this more when she overeats. Diet: eats healthy diet but does eat sweets. Eats very little meat and lots of fruits and vegetables. Exercise: stretches and does Pilates at least twice weekly. Walking irritates knees. Lifts weights daily.  Lab Results  Component Value Date   HGBA1C 6.4* 11/30/2013   Hypothyroidism -  Taking levothyroxine. Denies blurred vision, double vision, loss of vision, tremor, palpitations, diarrhea.  Denies change in hair, skin, nails.  Has occasional anxiety with stress. Does have constant constipation. Uses metamucil twice daily. Lab Results  Component Value Date   TSH 2.099 11/30/2013   Asthma Has had GI virus this week with nausea/vomiting which she feels has caused her asthma to act up. Has used rescue inhaler once this week. Feels breathing is better now. Continues to have dry cough and raspy throat.  RA - has pain in knees, ankles, toes, and hands. Not currently seeing rheumatologist. States meds made her "deathly sick". Is smoking marijuana 3 x week and taking turmeric. Feels that these treatments are giving her a "normal life". Swelling left 3 toes she had in April has  improved.  Has noticed white coating on tongue. States that she swishes with water after using budenoside-formoterol. Has nystatin that she uses prn.   Review of Systems  Constitutional: Positive for fatigue ([employer].). Negative for fever, chills and diaphoresis.  Cardiovascular: Negative for chest pain and leg swelling.      Past Medical History  Diagnosis Date  . Hyperlipidemia   . Thyroid disease     hypothyroid  . Anemia     pernicious  . Asthma   . Diabetes  mellitus     type 2  . Tobacco abuse   . Sjogren's syndrome 03/17/2011  . Colon cancer   . Rheumatoid arthritis(714.0)   . Pericarditis 07/26/2012  . History of pericarditis 07/26/2012    History   Social History  . Marital Status: Single    Spouse Name: N/A    Number of Children: 1  . Years of Education: N/A   Occupational History  . unemployed    Social History Main Topics  . Smoking status: Former Smoker    Types: Cigarettes  . Smokeless tobacco: Not on file     Comment: 3 cigarettes a month. Uses smokeless cigarette.  . Alcohol Use: 0.6 oz/week    1 Glasses of wine per week  . Drug Use: Not on file  . Sexual Activity: Not on file   Other Topics Concern  . Not on file   Social History Narrative   Regular exercise:  Stretching exercises. Resistance bands   Caffeine: 1 mug (2cus) daily.          Past Surgical History  Procedure Laterality Date  . Knee surgery  2006    arthroscopic left knee  . Knee surgery  2001    right knee  . Dilation and curettage of uterus  1975  . Colon surgery  8/.23/13    tumor removed from sigmoid colon.    Family History  Problem Relation Age of Onset  . Heart disease Other     CAD  .  Diabetes Other   . Hyperlipidemia Other   . Stroke Other     Allergies  Allergen Reactions  . Ativan [Lorazepam]     Visual hallucinations.   . Cefuroxime Axetil     REACTION: asthma, cough  . Levofloxacin   . Statins     Muscle pain, syncope    Current Outpatient Prescriptions on File Prior to Visit  Medication Sig Dispense Refill  . albuterol (VENTOLIN HFA) 108 (90 BASE) MCG/ACT inhaler Inhale 2 puffs into the lungs every 6 (six) hours as needed.  3 Inhaler  1  . aspirin 81 MG tablet Take 81 mg by mouth daily.        . budesonide-formoterol (SYMBICORT) 160-4.5 MCG/ACT inhaler Inhale 2 puffs into the lungs 2 (two) times daily.  3 Inhaler  1  . diclofenac (VOLTAREN) 75 MG EC tablet Take 1 tablet (75 mg total) by mouth 2 (two) times  daily as needed.  30 tablet  0  . fish oil-omega-3 fatty acids 1000 MG capsule Take 2 capsules (2 g total) by mouth 2 (two) times daily.      Marland Kitchen levothyroxine (SYNTHROID, LEVOTHROID) 100 MCG tablet One tablet by mouth daily 5 days a week and 1/2 tablet by mouth daily 2 days a week. Until mail order arrives.  90 tablet  1  . montelukast (SINGULAIR) 10 MG tablet Take 1 tablet (10 mg total) by mouth at bedtime.  90 tablet  1  . multivitamin (THERAGRAN) per tablet Take 1 tablet by mouth daily.        . pantoprazole (PROTONIX) 40 MG tablet Take 1 tablet (40 mg total) by mouth daily.  90 tablet  1  . zolpidem (AMBIEN) 5 MG tablet Take 1 tablet (5 mg total) by mouth at bedtime as needed for sleep.  90 tablet  1   No current facility-administered medications on file prior to visit.    BP 102/80  Pulse 78  Temp(Src) 97.9 F (36.6 C) (Oral)  Resp 18  Ht 5' 2.75" (1.594 m)  SpO2 98%   Objective:   Physical Exam  Constitutional: She is oriented to person, place, and time. She appears well-developed and well-nourished. No distress.  HENT:  Head: Normocephalic and atraumatic.  Mouth/Throat: Oropharynx is clear and moist. No oropharyngeal exudate.  White coating noted on tongue except for strip down the middle which looks smooth and red.  Eyes: Pupils are equal, round, and reactive to light. No scleral icterus.  Cardiovascular: Normal rate, regular rhythm, normal heart sounds and intact distal pulses.  Exam reveals no gallop and no friction rub.   No murmur heard. Pulmonary/Chest: Effort normal. No respiratory distress. She has no wheezes. She has no rales. She exhibits no tenderness.  Musculoskeletal: She exhibits no edema.  Lymphadenopathy:    She has no cervical adenopathy.  Neurological: She is alert and oriented to person, place, and time.  Skin: Skin is warm and dry. No rash noted. She is not diaphoretic. No erythema.  Psychiatric: She has a normal mood and affect. Her behavior is normal.        Assessment & Plan:  Patient seen by [facility] NP-student.  I have personally seen and examined patient and agree with Sheryl [name]'s assessment and plan.

## 2014-03-10 NOTE — Progress Notes (Signed)
Pre visit review using our clinic review tool, if applicable. No additional management support is needed unless otherwise documented below in the visit note. 

## 2014-03-10 NOTE — Assessment & Plan Note (Addendum)
Maintained on 100 mcg synthroid. Check TSH at next 3 month follow. Up.

## 2014-03-10 NOTE — Assessment & Plan Note (Signed)
Not currently seeing rheumatologist. States meds made her "deathly sick". Is smoking marijuana 3 x week and taking turmeric. Feels that these treatments are giving her a "normal life".

## 2014-03-11 LAB — HEMOGLOBIN A1C
Hgb A1c MFr Bld: 6.1 % — ABNORMAL HIGH (ref <5.7)
Mean Plasma Glucose: 128 mg/dL — ABNORMAL HIGH (ref <117)

## 2014-03-11 LAB — FOLATE: Folate: >20 ng/mL

## 2014-03-11 LAB — VITAMIN B12: Vitamin B-12: 443 pg/mL (ref 211–911)

## 2014-03-12 ENCOUNTER — Encounter: Payer: Self-pay | Admitting: Family

## 2014-03-13 ENCOUNTER — Encounter: Payer: Self-pay | Admitting: Family

## 2014-03-24 DIAGNOSIS — E119 Type 2 diabetes mellitus without complications: Secondary | ICD-10-CM | POA: Diagnosis not present

## 2014-03-25 DIAGNOSIS — K56609 Unspecified intestinal obstruction, unspecified as to partial versus complete obstruction: Secondary | ICD-10-CM

## 2014-03-25 HISTORY — DX: Unspecified intestinal obstruction, unspecified as to partial versus complete obstruction: K56.609

## 2014-04-03 ENCOUNTER — Encounter: Payer: Self-pay | Admitting: Family

## 2014-04-18 ENCOUNTER — Emergency Department (HOSPITAL_BASED_OUTPATIENT_CLINIC_OR_DEPARTMENT_OTHER)
Admission: EM | Admit: 2014-04-18 | Discharge: 2014-04-19 | Disposition: A | Discharge status: Other Acute Inpatient Hospital | Payer: Medicare Other | Attending: Emergency Medicine | Admitting: Emergency Medicine

## 2014-04-18 ENCOUNTER — Emergency Department (HOSPITAL_BASED_OUTPATIENT_CLINIC_OR_DEPARTMENT_OTHER): Payer: Medicare Other

## 2014-04-18 ENCOUNTER — Encounter (HOSPITAL_BASED_OUTPATIENT_CLINIC_OR_DEPARTMENT_OTHER): Payer: Self-pay | Admitting: Emergency Medicine

## 2014-04-18 DIAGNOSIS — Z7982 Long term (current) use of aspirin: Secondary | ICD-10-CM | POA: Insufficient documentation

## 2014-04-18 DIAGNOSIS — J45909 Unspecified asthma, uncomplicated: Secondary | ICD-10-CM | POA: Diagnosis not present

## 2014-04-18 DIAGNOSIS — Z87891 Personal history of nicotine dependence: Secondary | ICD-10-CM | POA: Diagnosis not present

## 2014-04-18 DIAGNOSIS — Z791 Long term (current) use of non-steroidal anti-inflammatories (NSAID): Secondary | ICD-10-CM | POA: Insufficient documentation

## 2014-04-18 DIAGNOSIS — R51 Headache: Secondary | ICD-10-CM | POA: Insufficient documentation

## 2014-04-18 DIAGNOSIS — M069 Rheumatoid arthritis, unspecified: Secondary | ICD-10-CM | POA: Insufficient documentation

## 2014-04-18 DIAGNOSIS — Z85038 Personal history of other malignant neoplasm of large intestine: Secondary | ICD-10-CM | POA: Insufficient documentation

## 2014-04-18 DIAGNOSIS — R109 Unspecified abdominal pain: Secondary | ICD-10-CM | POA: Diagnosis not present

## 2014-04-18 DIAGNOSIS — K56609 Unspecified intestinal obstruction, unspecified as to partial versus complete obstruction: Secondary | ICD-10-CM | POA: Diagnosis not present

## 2014-04-18 DIAGNOSIS — E079 Disorder of thyroid, unspecified: Secondary | ICD-10-CM | POA: Diagnosis not present

## 2014-04-18 DIAGNOSIS — Z862 Personal history of diseases of the blood and blood-forming organs and certain disorders involving the immune mechanism: Secondary | ICD-10-CM | POA: Diagnosis not present

## 2014-04-18 DIAGNOSIS — E119 Type 2 diabetes mellitus without complications: Secondary | ICD-10-CM | POA: Diagnosis not present

## 2014-04-18 DIAGNOSIS — K8020 Calculus of gallbladder without cholecystitis without obstruction: Secondary | ICD-10-CM | POA: Diagnosis not present

## 2014-04-18 DIAGNOSIS — R1084 Generalized abdominal pain: Secondary | ICD-10-CM | POA: Diagnosis not present

## 2014-04-18 DIAGNOSIS — Z79899 Other long term (current) drug therapy: Secondary | ICD-10-CM | POA: Diagnosis not present

## 2014-04-18 DIAGNOSIS — Z9889 Other specified postprocedural states: Secondary | ICD-10-CM | POA: Insufficient documentation

## 2014-04-18 DIAGNOSIS — IMO0002 Reserved for concepts with insufficient information to code with codable children: Secondary | ICD-10-CM | POA: Diagnosis not present

## 2014-04-18 DIAGNOSIS — J029 Acute pharyngitis, unspecified: Secondary | ICD-10-CM | POA: Insufficient documentation

## 2014-04-18 DIAGNOSIS — K7689 Other specified diseases of liver: Secondary | ICD-10-CM | POA: Diagnosis not present

## 2014-04-18 LAB — COMPREHENSIVE METABOLIC PANEL
ALK PHOS: 92 U/L (ref 39–117)
ALT: 116 U/L — AB (ref 0–35)
ANION GAP: 17 — AB (ref 5–15)
AST: 93 U/L — ABNORMAL HIGH (ref 0–37)
Albumin: 4 g/dL (ref 3.5–5.2)
BUN: 23 mg/dL (ref 6–23)
CO2: 25 mEq/L (ref 19–32)
Calcium: 10.3 mg/dL (ref 8.4–10.5)
Chloride: 93 mEq/L — ABNORMAL LOW (ref 96–112)
Creatinine, Ser: 1 mg/dL (ref 0.50–1.10)
GFR calc non Af Amer: 57 mL/min — ABNORMAL LOW (ref >90)
GFR, EST AFRICAN AMERICAN: 67 mL/min — AB (ref >90)
GLUCOSE: 154 mg/dL — AB (ref 70–99)
POTASSIUM: 4.6 meq/L (ref 3.7–5.3)
Sodium: 135 mEq/L — ABNORMAL LOW (ref 137–147)
Total Bilirubin: 0.9 mg/dL (ref 0.3–1.2)
Total Protein: 9.8 g/dL — ABNORMAL HIGH (ref 6.0–8.3)

## 2014-04-18 LAB — URINALYSIS, ROUTINE W REFLEX MICROSCOPIC
Glucose, UA: NEGATIVE mg/dL
HGB URINE DIPSTICK: NEGATIVE
Ketones, ur: 15 mg/dL — AB
Nitrite: NEGATIVE
PROTEIN: 30 mg/dL — AB
Specific Gravity, Urine: 1.025 (ref 1.005–1.030)
Urobilinogen, UA: 1 mg/dL (ref 0.0–1.0)
pH: 5.5 (ref 5.0–8.0)

## 2014-04-18 LAB — CBC WITH DIFFERENTIAL/PLATELET
BASOS PCT: 0 % (ref 0–1)
Basophils Absolute: 0 10*3/uL (ref 0.0–0.1)
EOS ABS: 0.1 10*3/uL (ref 0.0–0.7)
EOS PCT: 1 % (ref 0–5)
HCT: 54.1 % — ABNORMAL HIGH (ref 36.0–46.0)
Hemoglobin: 18.7 g/dL — ABNORMAL HIGH (ref 12.0–15.0)
Lymphocytes Relative: 14 % (ref 12–46)
Lymphs Abs: 1.6 10*3/uL (ref 0.7–4.0)
MCH: 30.9 pg (ref 26.0–34.0)
MCHC: 34.6 g/dL (ref 30.0–36.0)
MCV: 89.4 fL (ref 78.0–100.0)
MONOS PCT: 6 % (ref 3–12)
Monocytes Absolute: 0.6 10*3/uL (ref 0.1–1.0)
NEUTROS PCT: 79 % — AB (ref 43–77)
Neutro Abs: 8.8 10*3/uL — ABNORMAL HIGH (ref 1.7–7.7)
Platelets: 202 10*3/uL (ref 150–400)
RBC: 6.05 MIL/uL — ABNORMAL HIGH (ref 3.87–5.11)
RDW: 14.8 % (ref 11.5–15.5)
WBC: 11.1 10*3/uL — ABNORMAL HIGH (ref 4.0–10.5)

## 2014-04-18 LAB — URINE MICROSCOPIC-ADD ON

## 2014-04-18 LAB — LIPASE, BLOOD: Lipase: 52 U/L (ref 11–59)

## 2014-04-18 MED ORDER — ONDANSETRON HCL 4 MG/2ML IJ SOLN
4.0000 mg | Freq: Once | INTRAMUSCULAR | Status: AC
Start: 2014-04-18 — End: 2014-04-18
  Administered 2014-04-18: 4 mg via INTRAVENOUS
  Filled 2014-04-18: qty 2

## 2014-04-18 MED ORDER — IOHEXOL 300 MG/ML  SOLN
50.0000 mL | Freq: Once | INTRAMUSCULAR | Status: AC | PRN
  Administered 2014-04-18: 50 mL via ORAL

## 2014-04-18 MED ORDER — IOHEXOL 300 MG/ML  SOLN
100.0000 mL | Freq: Once | INTRAMUSCULAR | Status: AC | PRN
  Administered 2014-04-18: 100 mL via INTRAVENOUS

## 2014-04-18 MED ORDER — SODIUM CHLORIDE 0.9 % IV SOLN: INTRAVENOUS | Status: DC

## 2014-04-18 MED ORDER — HYDROMORPHONE HCL PF 1 MG/ML IJ SOLN
1.0000 mg | Freq: Once | INTRAMUSCULAR | Status: AC
  Administered 2014-04-18: 1 mg via INTRAVENOUS
  Filled 2014-04-18: qty 1

## 2014-04-18 MED ORDER — SODIUM CHLORIDE 0.9 % IV BOLUS (SEPSIS)
500.0000 mL | Freq: Once | INTRAVENOUS | Status: AC
  Administered 2014-04-18: 500 mL via INTRAVENOUS

## 2014-04-18 NOTE — ED Notes (Signed)
Pt. Reports abd. Pian with history of colon cancer.  Pt. Reports she has been vomiting since Sunday.  Pt. In triage telling RN how to care for her and about her health problems.

## 2014-04-18 NOTE — ED Provider Notes (Addendum)
CSN: [id number]     Arrival date & time 04/18/14  1927 History  This chart was scribed for Fredia Sorrow, MD by Peyton Bottoms, ED Scribe. This patient was seen in room MH07/MH07 and the patient's care was started at 8:28 PM.  Chief Complaint  Patient presents with  . Emesis  . Abdominal Pain   Patient is a 68 y.o. female presenting with vomiting and abdominal pain. The history is provided by the patient. No language interpreter was used.  Emesis Severity:  Moderate Duration:  3 days Timing:  Intermittent Number of daily episodes:  5 How soon after eating does vomiting occur:  5 minutes Progression:  Unchanged Recent urination:  Normal Associated symptoms: abdominal pain, chills, headaches and sore throat   Abdominal Pain Associated symptoms: chills, cough, nausea, sore throat and vomiting   Associated symptoms: no chest pain, no dysuria, no fever and no shortness of breath     HPI Comments: KRISTOL ALMANZAR is a 68 y.o. female with a history of colon cancer, who presents to the Emergency Department complaining of generalized, sharp, cramping abdominal pain that began 3 days ago. She states that her abdominal pain occasionally radiates to her back. She currently rates her pain as 0/10, with 10/10 at its worst. Patient states that she has had associated episodes of emesis that began 3 days ago. She states she had 5 episodes of emesis today. She states she vomitted every time she tried to eat or drink. Patient has had about 20 episodes in total since onset. She states she has had mild abnormal bowel movements. She states she sometimes feels dizzy with episodes of sharp abdominal pain. Patient had a colectomy in 2013. She is currently seen by Dr. Inda Castle at [facility]. She states she has no allergies to pain medications.   Past Medical History  Diagnosis Date  . Hyperlipidemia   . Thyroid disease     hypothyroid  . Anemia     pernicious  . Asthma   . Diabetes mellitus      type 2  . Tobacco abuse   . Sjogren's syndrome 03/17/2011  . Colon cancer   . Rheumatoid arthritis(714.0)   . Pericarditis 07/26/2012  . History of pericarditis 07/26/2012   Past Surgical History  Procedure Laterality Date  . Knee surgery  2006    arthroscopic left knee  . Knee surgery  2001    right knee  . Dilation and curettage of uterus  1975  . Colon surgery  8/.23/13    tumor removed from sigmoid colon.   Family History  Problem Relation Age of Onset  . Heart disease Other     CAD  . Diabetes Other   . Hyperlipidemia Other   . Stroke Other    History  Substance Use Topics  . Smoking status: Former Smoker    Types: Cigarettes  . Smokeless tobacco: Not on file     Comment: 3 cigarettes a month. Uses smokeless cigarette.  . Alcohol Use: 0.6 oz/week    1 Glasses of wine per week   OB History   Grav Para Term Preterm Abortions TAB SAB Ect Mult Living                 Review of Systems  Constitutional: Positive for chills. Negative for fever.  HENT: Positive for sore throat. Negative for rhinorrhea.   Eyes: Negative for visual disturbance.  Respiratory: Positive for cough. Negative for shortness of breath.   Cardiovascular: Negative for  chest pain and leg swelling.  Gastrointestinal: Positive for nausea, vomiting and abdominal pain.  Genitourinary: Negative for dysuria.  Musculoskeletal: Positive for back pain.  Skin: Negative for rash.  Neurological: Positive for headaches.  Hematological: Does not bruise/bleed easily.  Psychiatric/Behavioral: Negative for confusion.    Allergies  Ativan; Cefuroxime axetil; Levofloxacin; and Statins  Home Medications   Prior to Admission medications   Medication Sig Start Date End Date Taking? Authorizing Provider  albuterol (VENTOLIN HFA) 108 (90 BASE) MCG/ACT inhaler Inhale 2 puffs into the lungs every 6 (six) hours as needed. 01/24/14   [name], NP  aspirin 81 MG tablet Take 81 mg by mouth daily.       Historical Provider, MD  budesonide-formoterol (SYMBICORT) 160-4.5 MCG/ACT inhaler Inhale 2 puffs into the lungs 2 (two) times daily. 11/21/13   [name], NP  diclofenac (VOLTAREN) 75 MG EC tablet Take 1 tablet (75 mg total) by mouth 2 (two) times daily as needed. 11/04/13   [name], NP  fish oil-omega-3 fatty acids 1000 MG capsule Take 2 capsules (2 g total) by mouth 2 (two) times daily. 02/16/13   [name], NP  fluticasone (FLONASE) 50 MCG/ACT nasal spray Place 2 sprays into the nose daily as needed. 09/21/12   [name], NP  levothyroxine (SYNTHROID, LEVOTHROID) 100 MCG tablet One tablet by mouth daily 5 days a week and 1/2 tablet by mouth daily 2 days a week. Until mail order arrives. 01/24/14   [name], NP  montelukast (SINGULAIR) 10 MG tablet Take 1 tablet (10 mg total) by mouth at bedtime. 08/31/13   [name], NP  multivitamin [facility] per tablet Take 1 tablet by mouth daily.      Historical Provider, MD  NON FORMULARY TUMERIC MILK.    Historical Provider, MD  nystatin (MYCOSTATIN) 100000 UNIT/ML suspension TAKE 5 MLS BY MOUTH 4 TIMES DAILY. AS NEEDED. 01/31/14   [name], NP  pantoprazole (PROTONIX) 40 MG tablet Take 1 tablet (40 mg total) by mouth daily. 01/24/14   [name], NP  zolpidem (AMBIEN) 5 MG tablet Take 1 tablet (5 mg total) by mouth at bedtime as needed for sleep. 11/22/13   Mosie Lukes, MD   Triage Vitals: BP 129/106  Pulse 107  Temp(Src) 98.2 F (36.8 C) (Oral)  Resp 18  Ht 5\' 3"  (1.6 m)  Wt 185 lb (83.915 kg)  BMI 32.78 kg/m2  SpO2 98% Physical Exam  Nursing note and vitals reviewed. Constitutional: She is oriented to person, place, and time. She appears well-developed and well-nourished.  HENT:  Mouth/Throat: Oropharynx is clear and moist.  Eyes: Conjunctivae and EOM are normal. Pupils are equal, round, and reactive to light.  Cardiovascular: Normal rate, regular rhythm and normal heart  sounds.   Pulmonary/Chest: Effort normal and breath sounds normal.  Abdominal: There is no tenderness.  Bowel sounds decreased but present  Musculoskeletal: She exhibits no edema.  Neurological: She is alert and oriented to person, place, and time. No cranial nerve deficit. She exhibits normal muscle tone. Coordination normal.    ED Course  Procedures (including critical care time)  DIAGNOSTIC STUDIES: Oxygen Saturation is 98% on RA, normal by my interpretation.    COORDINATION OF CARE: 8:32 PM- Discussed plans to order diagnostic lab work and imaging. Pt advised of plan for treatment and pt agrees. Results for orders placed during the hospital encounter of 04/18/14  COMPREHENSIVE METABOLIC PANEL      Result Value Ref Range   Sodium  135 (*) 137 - 147 mEq/L   Potassium 4.6  3.7 - 5.3 mEq/L   Chloride 93 (*) 96 - 112 mEq/L   CO2 25  19 - 32 mEq/L   Glucose, Bld 154 (*) 70 - 99 mg/dL   BUN 23  6 - 23 mg/dL   Creatinine, Ser 1.00  0.50 - 1.10 mg/dL   Calcium 10.3  8.4 - 10.5 mg/dL   Total Protein 9.8 (*) 6.0 - 8.3 g/dL   Albumin 4.0  3.5 - 5.2 g/dL   AST 93 (*) 0 - 37 U/L   ALT 116 (*) 0 - 35 U/L   Alkaline Phosphatase 92  39 - 117 U/L   Total Bilirubin 0.9  0.3 - 1.2 mg/dL   GFR calc non Af Amer 57 (*) >90 mL/min   GFR calc Af Amer 67 (*) >90 mL/min   Anion gap 17 (*) 5 - 15  LIPASE, BLOOD      Result Value Ref Range   Lipase 52  11 - 59 U/L  CBC WITH DIFFERENTIAL      Result Value Ref Range   WBC 11.1 (*) 4.0 - 10.5 K/uL   RBC 6.05 (*) 3.87 - 5.11 MIL/uL   Hemoglobin 18.7 (*) 12.0 - 15.0 g/dL   HCT 54.1 (*) 36.0 - 46.0 %   MCV 89.4  78.0 - 100.0 fL   MCH 30.9  26.0 - 34.0 pg   MCHC 34.6  30.0 - 36.0 g/dL   RDW 14.8  11.5 - 15.5 %   Platelets 202  150 - 400 K/uL   Neutrophils Relative % 79 (*) 43 - 77 %   Neutro Abs 8.8 (*) 1.7 - 7.7 K/uL   Lymphocytes Relative 14  12 - 46 %   Lymphs Abs 1.6  0.7 - 4.0 K/uL   Monocytes Relative 6  3 - 12 %   Monocytes Absolute 0.6   0.1 - 1.0 K/uL   Eosinophils Relative 1  0 - 5 %   Eosinophils Absolute 0.1  0.0 - 0.7 K/uL   Basophils Relative 0  0 - 1 %   Basophils Absolute 0.0  0.0 - 0.1 K/uL  URINALYSIS, ROUTINE W REFLEX MICROSCOPIC      Result Value Ref Range   Color, Urine ORANGE (*) YELLOW   APPearance CLOUDY (*) CLEAR   Specific Gravity, Urine 1.025  1.005 - 1.030   pH 5.5  5.0 - 8.0   Glucose, UA NEGATIVE  NEGATIVE mg/dL   Hgb urine dipstick NEGATIVE  NEGATIVE   Bilirubin Urine MODERATE (*) NEGATIVE   Ketones, ur 15 (*) NEGATIVE mg/dL   Protein, ur 30 (*) NEGATIVE mg/dL   Urobilinogen, UA 1.0  0.0 - 1.0 mg/dL   Nitrite NEGATIVE  NEGATIVE   Leukocytes, UA LARGE (*) NEGATIVE  URINE MICROSCOPIC-ADD ON      Result Value Ref Range   Squamous Epithelial / LPF FEW (*) RARE   WBC, UA 21-50  <3 WBC/hpf   Bacteria, UA FEW (*) RARE   Casts HYALINE CASTS (*) NEGATIVE   Ct Abdomen Pelvis W Contrast  04/18/2014   CLINICAL DATA:  Sharp cramping abdominal pain beginning 3 days ago. Nausea and vomiting. History of colectomy for colon cancer 2013.  EXAM: CT ABDOMEN AND PELVIS WITH CONTRAST  TECHNIQUE: Multidetector CT imaging of the abdomen and pelvis was performed using the standard protocol following bolus administration of intravenous contrast.  CONTRAST:  28mL OMNIPAQUE IOHEXOL 300 MG/ML SOLN, 18mL OMNIPAQUE  IOHEXOL 300 MG/ML SOLN  COMPARISON:  04/11/2013  FINDINGS: The lung bases are clear.  Small esophageal hiatal hernia. Diffuse fatty infiltration of the liver. No focal liver lesions identified. Cholelithiasis with single stone demonstrated in the gallbladder. No inflammatory changes. Calcified granulomas in the spleen. Tiny low-attenuation lesion in the inferior spleen measuring 9 mm. This is probably a cyst but does appear to be slightly larger than on previous study. 11 mm left adrenal gland nodule is unchanged. Pancreas, kidneys, inferior vena cava are unremarkable. Calcification of the abdominal aorta without  aneurysm. Stomach is normal. Proximal jejunum is distended with decompression of the distal jejunum and ileum. Transition zone appears to be in the mid pelvis posteriorly. Small bowel obstruction is suspected. Diffusely stool-filled colon without distention. No free air or free fluid in the abdomen. Moderately large ventral abdominal wall hernias containing fat. No bowel content.  Pelvis: Uterus and ovaries are not enlarged. Surgical anastomosis in the sigmoid colon. No evidence of residual or recurrent tumor. No significant lymphadenopathy. Small amount of free fluid in the pelvis is nonspecific but possibly reactive. Appendix is not identified. Lucent lesions demonstrated in T11 and T12 vertebral bodies, stable since previous study, possibly representing hemangiomas.  IMPRESSION: Proximal small bowel distention to the level of the pelvis with decompression of the distal jejunum and ileum. Changes likely represent small bowel obstruction due to adhesion. Ventral abdominal wall hernias containing fat. No bowel herniation. No definite evidence of metastatic disease. Cholelithiasis. Fatty infiltration of the liver.   Electronically Signed   By: Lucienne Capers M.D.   On: 04/18/2014 22:42      MDM   Final diagnoses:  Small bowel obstruction   Patient with symptoms consistent with small bowel structure. CT scan now shows evidence of small bowel obstruction. Patient improved here in the emergency department pain decreasing no persistent vomiting. Patient is followed by [facility]. Patient's had a partial colectomy in the past due to colon cancer. Patient has not had a history of small bowel structure is in the past. Patient should urinalysis suggestive of urinary tract infection. But doesn't negative nitrite and no bacteria. Based on the urine culture.   Patient surgeon in the past as been a [facility]. Patient's general surgery group is there. It's Dr. Leighton Ruff, spoke to the surgeon on  call for the group Dr. Georgia Lopes they would like to have her transported there for admission patient has been operated on before by their group. We'll discuss with the unassigned hospitalist for the admission.  Contacted the undersigned hospitalist [facility] apparently their beds are very full he been told not to accept any outside admissions however this is a situation where the surgery group wants her  There. We've been informed to talk to the nursing supervisor at the [facility]  Not treat for urinary tract infection at this time. Urine culture is pending.  I personally performed the services described in this documentation, which was scribed in my presence. The recorded information has been reviewed and is accurate.     Fredia Sorrow, MD 04/19/14 0017  Fredia Sorrow, MD 04/19/14 0019  [facility] does have availability for a MedSurg bed. Dr. Clydene Laming from hospitalist service excepting. The transfer will be done by [place]. Patient currently looking a lot better and feeling a lot better.  Fredia Sorrow, MD 04/19/14 [phone]

## 2014-04-19 DIAGNOSIS — R7401 Elevation of levels of liver transaminase levels: Secondary | ICD-10-CM | POA: Diagnosis not present

## 2014-04-19 DIAGNOSIS — E669 Obesity, unspecified: Secondary | ICD-10-CM | POA: Diagnosis not present

## 2014-04-19 DIAGNOSIS — F17200 Nicotine dependence, unspecified, uncomplicated: Secondary | ICD-10-CM | POA: Diagnosis present

## 2014-04-19 DIAGNOSIS — Z862 Personal history of diseases of the blood and blood-forming organs and certain disorders involving the immune mechanism: Secondary | ICD-10-CM | POA: Diagnosis not present

## 2014-04-19 DIAGNOSIS — K6389 Other specified diseases of intestine: Secondary | ICD-10-CM | POA: Diagnosis not present

## 2014-04-19 DIAGNOSIS — Z791 Long term (current) use of non-steroidal anti-inflammatories (NSAID): Secondary | ICD-10-CM | POA: Diagnosis not present

## 2014-04-19 DIAGNOSIS — Z85038 Personal history of other malignant neoplasm of large intestine: Secondary | ICD-10-CM | POA: Diagnosis not present

## 2014-04-19 DIAGNOSIS — Z79899 Other long term (current) drug therapy: Secondary | ICD-10-CM | POA: Diagnosis not present

## 2014-04-19 DIAGNOSIS — Z888 Allergy status to other drugs, medicaments and biological substances status: Secondary | ICD-10-CM | POA: Diagnosis not present

## 2014-04-19 DIAGNOSIS — Z7982 Long term (current) use of aspirin: Secondary | ICD-10-CM | POA: Diagnosis not present

## 2014-04-19 DIAGNOSIS — E079 Disorder of thyroid, unspecified: Secondary | ICD-10-CM | POA: Diagnosis not present

## 2014-04-19 DIAGNOSIS — K8020 Calculus of gallbladder without cholecystitis without obstruction: Secondary | ICD-10-CM | POA: Diagnosis present

## 2014-04-19 DIAGNOSIS — Z881 Allergy status to other antibiotic agents status: Secondary | ICD-10-CM | POA: Diagnosis not present

## 2014-04-19 DIAGNOSIS — J45909 Unspecified asthma, uncomplicated: Secondary | ICD-10-CM | POA: Diagnosis present

## 2014-04-19 DIAGNOSIS — E119 Type 2 diabetes mellitus without complications: Secondary | ICD-10-CM | POA: Diagnosis present

## 2014-04-19 DIAGNOSIS — M069 Rheumatoid arthritis, unspecified: Secondary | ICD-10-CM | POA: Diagnosis present

## 2014-04-19 DIAGNOSIS — M3500 Sicca syndrome, unspecified: Secondary | ICD-10-CM | POA: Diagnosis present

## 2014-04-19 DIAGNOSIS — Z6833 Body mass index (BMI) 33.0-33.9, adult: Secondary | ICD-10-CM | POA: Diagnosis not present

## 2014-04-19 DIAGNOSIS — D510 Vitamin B12 deficiency anemia due to intrinsic factor deficiency: Secondary | ICD-10-CM | POA: Diagnosis present

## 2014-04-19 DIAGNOSIS — R109 Unspecified abdominal pain: Secondary | ICD-10-CM | POA: Diagnosis not present

## 2014-04-19 DIAGNOSIS — Z9049 Acquired absence of other specified parts of digestive tract: Secondary | ICD-10-CM | POA: Diagnosis not present

## 2014-04-19 DIAGNOSIS — Z87891 Personal history of nicotine dependence: Secondary | ICD-10-CM | POA: Diagnosis not present

## 2014-04-19 DIAGNOSIS — Z4682 Encounter for fitting and adjustment of non-vascular catheter: Secondary | ICD-10-CM | POA: Diagnosis not present

## 2014-04-19 DIAGNOSIS — E039 Hypothyroidism, unspecified: Secondary | ICD-10-CM | POA: Diagnosis present

## 2014-04-19 DIAGNOSIS — K7689 Other specified diseases of liver: Secondary | ICD-10-CM | POA: Diagnosis present

## 2014-04-19 DIAGNOSIS — IMO0002 Reserved for concepts with insufficient information to code with codable children: Secondary | ICD-10-CM | POA: Diagnosis not present

## 2014-04-19 DIAGNOSIS — N390 Urinary tract infection, site not specified: Secondary | ICD-10-CM | POA: Diagnosis not present

## 2014-04-19 DIAGNOSIS — J029 Acute pharyngitis, unspecified: Secondary | ICD-10-CM | POA: Diagnosis not present

## 2014-04-19 DIAGNOSIS — K5669 Other intestinal obstruction: Secondary | ICD-10-CM | POA: Diagnosis not present

## 2014-04-19 DIAGNOSIS — K565 Intestinal adhesions [bands], unspecified as to partial versus complete obstruction: Secondary | ICD-10-CM | POA: Diagnosis present

## 2014-04-19 DIAGNOSIS — Z9889 Other specified postprocedural states: Secondary | ICD-10-CM | POA: Diagnosis not present

## 2014-04-19 DIAGNOSIS — E785 Hyperlipidemia, unspecified: Secondary | ICD-10-CM | POA: Diagnosis present

## 2014-04-19 DIAGNOSIS — R51 Headache: Secondary | ICD-10-CM | POA: Diagnosis not present

## 2014-04-19 DIAGNOSIS — K439 Ventral hernia without obstruction or gangrene: Secondary | ICD-10-CM | POA: Diagnosis present

## 2014-04-19 DIAGNOSIS — K56609 Unspecified intestinal obstruction, unspecified as to partial versus complete obstruction: Secondary | ICD-10-CM | POA: Diagnosis not present

## 2014-04-19 DIAGNOSIS — R7989 Other specified abnormal findings of blood chemistry: Secondary | ICD-10-CM | POA: Diagnosis present

## 2014-04-19 LAB — URINE CULTURE: Colony Count: 70000

## 2014-04-19 MED ORDER — LIDOCAINE HCL 2 % EX GEL
CUTANEOUS | Status: AC
  Administered 2014-04-19: 03:00:00
  Filled 2014-04-19: qty 20

## 2014-04-19 MED ORDER — HYDROMORPHONE HCL PF 1 MG/ML IJ SOLN
1.0000 mg | Freq: Once | INTRAMUSCULAR | Status: AC
  Administered 2014-04-19: 1 mg via INTRAVENOUS
  Filled 2014-04-19: qty 1

## 2014-04-19 NOTE — ED Notes (Signed)
MD at bedside discussing transfer to [facility].

## 2014-04-24 ENCOUNTER — Other Ambulatory Visit: Payer: Self-pay | Admitting: Family

## 2014-05-02 ENCOUNTER — Encounter: Payer: Self-pay | Admitting: Physician Assistant

## 2014-05-02 ENCOUNTER — Telehealth: Payer: Self-pay | Admitting: Physician Assistant

## 2014-05-02 ENCOUNTER — Ambulatory Visit (INDEPENDENT_AMBULATORY_CARE_PROVIDER_SITE_OTHER): Payer: Medicare Other | Admitting: Physician Assistant

## 2014-05-02 VITALS — BP 141/98 | HR 92 | Temp 98.2°F | Wt 185.0 lb

## 2014-05-02 DIAGNOSIS — K8020 Calculus of gallbladder without cholecystitis without obstruction: Secondary | ICD-10-CM

## 2014-05-02 DIAGNOSIS — K56609 Unspecified intestinal obstruction, unspecified as to partial versus complete obstruction: Secondary | ICD-10-CM

## 2014-05-02 DIAGNOSIS — R030 Elevated blood-pressure reading, without diagnosis of hypertension: Secondary | ICD-10-CM | POA: Diagnosis not present

## 2014-05-02 DIAGNOSIS — R740 Nonspecific elevation of levels of transaminase and lactic acid dehydrogenase [LDH]: Principal | ICD-10-CM

## 2014-05-02 DIAGNOSIS — R7401 Elevation of levels of liver transaminase levels: Secondary | ICD-10-CM

## 2014-05-02 HISTORY — DX: Unspecified intestinal obstruction, unspecified as to partial versus complete obstruction: K56.609

## 2014-05-02 LAB — BASIC METABOLIC PANEL
BUN: 12 mg/dL (ref 6–23)
CALCIUM: 8.7 mg/dL (ref 8.4–10.5)
CO2: 27 mEq/L (ref 19–32)
Chloride: 101 mEq/L (ref 96–112)
Creatinine, Ser: 0.8 mg/dL (ref 0.4–1.2)
GFR: 72.91 mL/min (ref >60.00)
Glucose, Bld: 88 mg/dL (ref 70–99)
Potassium: 4.5 mEq/L (ref 3.5–5.1)
Sodium: 134 mEq/L — ABNORMAL LOW (ref 135–145)

## 2014-05-02 LAB — HEPATIC FUNCTION PANEL
ALBUMIN: 3.4 g/dL — AB (ref 3.5–5.2)
ALK PHOS: 71 U/L (ref 39–117)
ALT: 130 U/L — ABNORMAL HIGH (ref 0–35)
AST: 235 U/L — ABNORMAL HIGH (ref 0–37)
BILIRUBIN TOTAL: 0.5 mg/dL (ref 0.2–1.2)
Bilirubin, Direct: 0.2 mg/dL (ref 0.0–0.3)
Total Protein: 7.9 g/dL (ref 6.0–8.3)

## 2014-05-02 LAB — CBC
HEMATOCRIT: 44.7 % (ref 36.0–46.0)
Hemoglobin: 15 g/dL (ref 12.0–15.0)
MCHC: 33.6 g/dL (ref 30.0–36.0)
MCV: 92.4 fl (ref 78.0–100.0)
Platelets: 237 10*3/uL (ref 150.0–400.0)
RBC: 4.84 Mil/uL (ref 3.87–5.11)
RDW: 14.4 % (ref 11.5–15.5)
WBC: 6.1 10*3/uL (ref 4.0–10.5)

## 2014-05-02 MED ORDER — ONDANSETRON 8 MG PO TBDP: 8.0000 mg | ORAL_TABLET | Freq: Three times a day (TID) | ORAL | Status: DC | PRN

## 2014-05-02 NOTE — Progress Notes (Signed)
Patient presents to clinic today for Hospital Follow-up concerning a SBO.  Patient was seen in ER on 04/18/14 complaining of abdominal cramping, constipation and emesis x 3 days.  CT Abdomen was obtained revealing a SBO and abdominal herniation of fat.  The patient's GI physician was consulted and patient was subsequently admitted to [facility] for further management.  Patient was placed NPO with IV resuscitation and NG decompression.  Was monitored until stable for discharge.  Patient states that since discharge she has been doing much better.  Endorses staying on full liquid diet as recommended by hospital MD.  Is passing flatulence and stool without difficulty.  Last bowel movement this morning. Denies tenesmus, melena or hematochezia.  Is trying to restart solids this morning.  Has been experiencing some nausea but denies emesis or abdominal pain.    Past Medical History  Diagnosis Date  . Hyperlipidemia   . Thyroid disease     hypothyroid  . Anemia     pernicious  . Asthma   . Diabetes mellitus     type 2  . Tobacco abuse   . Sjogren's syndrome 03/17/2011  . Colon cancer   . Rheumatoid arthritis(714.0)   . Pericarditis 07/26/2012  . History of pericarditis 07/26/2012    Current Outpatient Prescriptions on File Prior to Visit  Medication Sig Dispense Refill  . albuterol (VENTOLIN HFA) 108 (90 BASE) MCG/ACT inhaler Inhale 2 puffs into the lungs every 6 (six) hours as needed.  3 Inhaler  1  . aspirin 81 MG tablet Take 81 mg by mouth daily.        . budesonide-formoterol (SYMBICORT) 160-4.5 MCG/ACT inhaler Inhale 2 puffs into the lungs 2 (two) times daily.  3 Inhaler  1  . diclofenac (VOLTAREN) 75 MG EC tablet Take 1 tablet (75 mg total) by mouth 2 (two) times daily as needed.  30 tablet  0  . fish oil-omega-3 fatty acids 1000 MG capsule Take 2 capsules (2 g total) by mouth 2 (two) times daily.      . fluticasone (FLONASE) 50 MCG/ACT nasal spray Place 2 sprays into the nose daily as needed.      Marland Kitchen  levothyroxine (SYNTHROID, LEVOTHROID) 100 MCG tablet One tablet by mouth daily 5 days a week and 1/2 tablet by mouth daily 2 days a week. Until mail order arrives.  90 tablet  1  . montelukast (SINGULAIR) 10 MG tablet Take 1 tablet (10 mg total) by mouth at bedtime.  90 tablet  1  . multivitamin (THERAGRAN) per tablet Take 1 tablet by mouth daily.        . NON FORMULARY TUMERIC MILK.      Marland Kitchen nystatin (MYCOSTATIN) 100000 UNIT/ML suspension TAKE 5 MLS BY MOUTH 4 TIMES DAILY.  180 mL  0  . pantoprazole (PROTONIX) 40 MG tablet Take 1 tablet (40 mg total) by mouth daily.  90 tablet  1  . zolpidem (AMBIEN) 5 MG tablet Take 1 tablet (5 mg total) by mouth at bedtime as needed for sleep.  90 tablet  1   No current facility-administered medications on file prior to visit.    Allergies  Allergen Reactions  . Ativan [Lorazepam]     Visual hallucinations.   . Cefuroxime Axetil     REACTION: asthma, cough  . Levofloxacin   . Statins     Muscle pain, syncope    Family History  Problem Relation Age of Onset  . Heart disease Other     CAD  .  Diabetes Other   . Hyperlipidemia Other   . Stroke Other     History   Social History  . Marital Status: Single    Spouse Name: N/A    Number of Children: 1  . Years of Education: N/A   Occupational History  . unemployed    Social History Main Topics  . Smoking status: Former Smoker    Types: Cigarettes  . Smokeless tobacco: None     Comment: 3 cigarettes a month. Uses smokeless cigarette.  . Alcohol Use: 0.6 oz/week    1 Glasses of wine per week  . Drug Use: None  . Sexual Activity: None   Other Topics Concern  . None   Social History Narrative   Regular exercise:  Stretching exercises. Resistance bands   Caffeine: 1 mug (2cus) daily.         Review of Systems - See HPI.  All other ROS are negative.  BP 141/98  Pulse 92  Temp(Src) 98.2 F (36.8 C)  Wt 185 lb (83.915 kg)  SpO2 98%  Physical Exam  Vitals  reviewed. Constitutional: She is oriented to person, place, and time and well-developed, well-nourished, and in no distress.  HENT:  Head: Normocephalic and atraumatic.  Eyes: Conjunctivae are normal.  Cardiovascular: Normal rate, regular rhythm, normal heart sounds and intact distal pulses.   Pulmonary/Chest: Effort normal and breath sounds normal. No respiratory distress. She has no wheezes. She has no rales. She exhibits no tenderness.  Abdominal: Soft. Bowel sounds are normal. She exhibits no distension and no mass. There is no tenderness. There is no rebound and no guarding.  Neurological: She is alert and oriented to person, place, and time.  Skin: Skin is warm and dry. No rash noted.  Psychiatric: Affect normal.   Recent Results (from the past 2160 hour(s))  BASIC METABOLIC PANEL     Status: Abnormal   Collection Time    02/03/14 11:19 AM      Result Value Ref Range   Sodium 136  135 - 145 mEq/L   Potassium 4.5  3.5 - 5.3 mEq/L   Chloride 99  96 - 112 mEq/L   CO2 27  19 - 32 mEq/L   Glucose, Bld 110 (*) 70 - 99 mg/dL   BUN 12  6 - 23 mg/dL   Creat 0.87  0.50 - 1.10 mg/dL   Calcium 9.0  8.4 - 10.5 mg/dL  VITAMIN B12     Status: None   Collection Time    03/10/14 12:03 PM      Result Value Ref Range   Vitamin B-12 443  211 - 911 pg/mL  FOLATE     Status: None   Collection Time    03/10/14 12:03 PM      Result Value Ref Range   Folate >20.0     Comment:       Reference Ranges             Deficient:       0.4 - 3.3 ng/mL             Indeterminate:   3.4 - 5.4 ng/mL             Normal:              > 5.4 ng/mL        HEMOGLOBIN A1C     Status: Abnormal   Collection Time    03/10/14 12:03 PM      Result  Value Ref Range   Hemoglobin A1C 6.1 (*) <5.7 %   Comment:                                                                            According to the ADA Clinical Practice Recommendations for 2011, when     HbA1c is used as a screening test:             >=6.5%    Diagnostic of Diabetes Mellitus                (if abnormal result is confirmed)           5.7-6.4%   Increased risk of developing Diabetes Mellitus           References:Diagnosis and Classification of Diabetes Mellitus,Diabetes     ZOXW,9604,54(UJWJX 1):S62-S69 and Standards of Medical Care in             Diabetes - 2011,Diabetes Care,2011,34 (Suppl 1):S11-S61.         Mean Plasma Glucose 128 (*) <117 mg/dL  COMPREHENSIVE METABOLIC PANEL     Status: Abnormal   Collection Time    04/18/14  8:46 PM      Result Value Ref Range   Sodium 135 (*) 137 - 147 mEq/L   Potassium 4.6  3.7 - 5.3 mEq/L   Chloride 93 (*) 96 - 112 mEq/L   CO2 25  19 - 32 mEq/L   Glucose, Bld 154 (*) 70 - 99 mg/dL   BUN 23  6 - 23 mg/dL   Creatinine, Ser 1.00  0.50 - 1.10 mg/dL   Calcium 10.3  8.4 - 10.5 mg/dL   Total Protein 9.8 (*) 6.0 - 8.3 g/dL   Albumin 4.0  3.5 - 5.2 g/dL   AST 93 (*) 0 - 37 U/L   ALT 116 (*) 0 - 35 U/L   Alkaline Phosphatase 92  39 - 117 U/L   Total Bilirubin 0.9  0.3 - 1.2 mg/dL   GFR calc non Af Amer 57 (*) >90 mL/min   GFR calc Af Amer 67 (*) >90 mL/min   Comment: (NOTE)     The eGFR has been calculated using the CKD EPI equation.     This calculation has not been validated in all clinical situations.     eGFR's persistently <90 mL/min signify possible Chronic Kidney     Disease.   Anion gap 17 (*) 5 - 15  LIPASE, BLOOD     Status: None   Collection Time    04/18/14  8:46 PM      Result Value Ref Range   Lipase 52  11 - 59 U/L  CBC WITH DIFFERENTIAL     Status: Abnormal   Collection Time    04/18/14  8:46 PM      Result Value Ref Range   WBC 11.1 (*) 4.0 - 10.5 K/uL   RBC 6.05 (*) 3.87 - 5.11 MIL/uL   Hemoglobin 18.7 (*) 12.0 - 15.0 g/dL   HCT 54.1 (*) 36.0 - 46.0 %   MCV 89.4  78.0 - 100.0 fL   MCH 30.9  26.0 - 34.0 pg   MCHC 34.6  30.0 - 36.0  g/dL   RDW 14.8  11.5 - 15.5 %   Platelets 202  150 - 400 K/uL   Neutrophils Relative % 79 (*) 43 - 77 %   Neutro Abs 8.8 (*)  1.7 - 7.7 K/uL   Lymphocytes Relative 14  12 - 46 %   Lymphs Abs 1.6  0.7 - 4.0 K/uL   Monocytes Relative 6  3 - 12 %   Monocytes Absolute 0.6  0.1 - 1.0 K/uL   Eosinophils Relative 1  0 - 5 %   Eosinophils Absolute 0.1  0.0 - 0.7 K/uL   Basophils Relative 0  0 - 1 %   Basophils Absolute 0.0  0.0 - 0.1 K/uL  URINALYSIS, ROUTINE W REFLEX MICROSCOPIC     Status: Abnormal   Collection Time    04/18/14  8:56 PM      Result Value Ref Range   Color, Urine ORANGE (*) YELLOW   Comment: BIOCHEMICALS MAY BE AFFECTED BY COLOR   APPearance CLOUDY (*) CLEAR   Specific Gravity, Urine 1.025  1.005 - 1.030   pH 5.5  5.0 - 8.0   Glucose, UA NEGATIVE  NEGATIVE mg/dL   Hgb urine dipstick NEGATIVE  NEGATIVE   Bilirubin Urine MODERATE (*) NEGATIVE   Ketones, ur 15 (*) NEGATIVE mg/dL   Protein, ur 30 (*) NEGATIVE mg/dL   Urobilinogen, UA 1.0  0.0 - 1.0 mg/dL   Nitrite NEGATIVE  NEGATIVE   Leukocytes, UA LARGE (*) NEGATIVE  URINE MICROSCOPIC-ADD ON     Status: Abnormal   Collection Time    04/18/14  8:56 PM      Result Value Ref Range   Squamous Epithelial / LPF FEW (*) RARE   WBC, UA 21-50  <3 WBC/hpf   Bacteria, UA FEW (*) RARE   Casts HYALINE CASTS (*) NEGATIVE  URINE CULTURE     Status: None   Collection Time    04/18/14  8:56 PM      Result Value Ref Range   Specimen Description URINE, CLEAN CATCH     Special Requests NONE     Culture  Setup Time       Value: 04/18/2014 23:42     Performed at [facility] Count       Value: 70,000 COLONIES/ML     Performed at [facility]   Culture       Value: Multiple bacterial morphotypes present, none predominant. Suggest appropriate recollection if clinically indicated.     Performed at [facility]   Report Status 04/19/2014 FINAL      Assessment/Plan: ELEVATED BLOOD PRESSURE WITHOUT DIAGNOSIS OF HYPERTENSION BP mildly elevated in clinic today likely due to stress and anxiety from recent health issues.   Patient asymptomatic.  Does not wish to start anxiety medication at present.  DASH diet encouraged.  SBO (small bowel obstruction) Passing stool and flatus without difficulty.  Ok to resume normal diet but recommended foods easy to digest -- BRAT diet -- before resuming all regular foods.  Stay well hydrated.  Fiber supplement and probiotic. Stool softener if no bm in 24 hours.  Milk of magnesia may also be beneficial.  If no BM in 48 hours, recommend magnesium citrate or enema.  If no BM and associated nausea or vomiting, return to clinic of proceed to UC or ER.

## 2014-05-02 NOTE — Progress Notes (Signed)
Pre visit review using our clinic review tool, if applicable. No additional management support is needed unless otherwise documented below in the visit note. 

## 2014-05-02 NOTE — Assessment & Plan Note (Signed)
Passing stool and flatus without difficulty.  Ok to resume normal diet but recommended foods easy to digest -- BRAT diet -- before resuming all regular foods.  Stay well hydrated.  Fiber supplement and probiotic. Stool softener if no bm in 24 hours.  Milk of magnesia may also be beneficial.  If no BM in 48 hours, recommend magnesium citrate or enema.  If no BM and associated nausea or vomiting, return to clinic of proceed to UC or ER.

## 2014-05-02 NOTE — Patient Instructions (Addendum)
Please obtain labs.  I will call you with your results.  Continue fiber supplement and probiotic daily.  Make sure to stay well hydrated.  If you go 1 day without a bowel movement, take a stool softener.  If no BM in 2 days, take some Milk of Magnesia or Miralax.  If no bowel movement then, please call or come see [place].   I am glad you are doing better.  I think it would be safe at this point to reintroduce solid foods to your diet.  I recommend starting with Bananas, Rice, Applesauce and Toast as these will be easier to breakdown and less likely to cause nausea.  Once you can tolerate these, it is ok to resume a normal diet.

## 2014-05-02 NOTE — Telephone Encounter (Signed)
Spoke with patient concerning AST/ALT elevations.  Ordered Acute Hepatitis, repeat LFTs and US abdomen. Patient Asymptomatic at present.  Denies alcohol consumption of acetaminophen-containing products.  Does have history of fatty liver disease.  CT Abdomen revealed cholelithiasis. Again, patient asymptomatic.   Patient to schedule appointment with her GI Dr. Truman Hayward. Alarm Signs/Symptoms discussed with patient.  Patient knows when to return to office versus presenting to ER.

## 2014-05-02 NOTE — Assessment & Plan Note (Signed)
BP mildly elevated in clinic today likely due to stress and anxiety from recent health issues.  Patient asymptomatic.  Does not wish to start anxiety medication at present.  DASH diet encouraged.

## 2014-05-03 ENCOUNTER — Ambulatory Visit (HOSPITAL_BASED_OUTPATIENT_CLINIC_OR_DEPARTMENT_OTHER)
Admission: RE | Admit: 2014-05-03 | Discharge: 2014-05-03 | Disposition: A | Discharge status: Home / Self Care | Payer: Medicare Other | Source: Ambulatory Visit | Attending: Physician Assistant | Admitting: Physician Assistant

## 2014-05-03 ENCOUNTER — Other Ambulatory Visit (INDEPENDENT_AMBULATORY_CARE_PROVIDER_SITE_OTHER): Payer: Medicare Other

## 2014-05-03 DIAGNOSIS — E785 Hyperlipidemia, unspecified: Secondary | ICD-10-CM | POA: Diagnosis not present

## 2014-05-03 DIAGNOSIS — R7401 Elevation of levels of liver transaminase levels: Secondary | ICD-10-CM

## 2014-05-03 DIAGNOSIS — D7389 Other diseases of spleen: Secondary | ICD-10-CM | POA: Insufficient documentation

## 2014-05-03 DIAGNOSIS — R740 Nonspecific elevation of levels of transaminase and lactic acid dehydrogenase [LDH]: Secondary | ICD-10-CM

## 2014-05-03 DIAGNOSIS — K8020 Calculus of gallbladder without cholecystitis without obstruction: Secondary | ICD-10-CM | POA: Insufficient documentation

## 2014-05-03 LAB — HEPATIC FUNCTION PANEL
ALBUMIN: 3.3 g/dL — AB (ref 3.5–5.2)
ALK PHOS: 77 U/L (ref 39–117)
ALT: 171 U/L — AB (ref 0–35)
AST: 288 U/L — ABNORMAL HIGH (ref 0–37)
BILIRUBIN TOTAL: 0.2 mg/dL (ref 0.2–1.2)
Bilirubin, Direct: 0.1 mg/dL (ref 0.0–0.3)
Total Protein: 7.7 g/dL (ref 6.0–8.3)

## 2014-05-12 DIAGNOSIS — Z85038 Personal history of other malignant neoplasm of large intestine: Secondary | ICD-10-CM | POA: Diagnosis not present

## 2014-05-12 DIAGNOSIS — M3500 Sicca syndrome, unspecified: Secondary | ICD-10-CM | POA: Diagnosis not present

## 2014-05-12 DIAGNOSIS — L408 Other psoriasis: Secondary | ICD-10-CM | POA: Diagnosis not present

## 2014-05-12 DIAGNOSIS — R7989 Other specified abnormal findings of blood chemistry: Secondary | ICD-10-CM | POA: Diagnosis not present

## 2014-05-12 DIAGNOSIS — C189 Malignant neoplasm of colon, unspecified: Secondary | ICD-10-CM | POA: Diagnosis not present

## 2014-05-12 DIAGNOSIS — M069 Rheumatoid arthritis, unspecified: Secondary | ICD-10-CM | POA: Diagnosis not present

## 2014-05-22 DIAGNOSIS — R945 Abnormal results of liver function studies: Secondary | ICD-10-CM | POA: Diagnosis not present

## 2014-05-24 ENCOUNTER — Encounter: Payer: Self-pay | Admitting: Family

## 2014-05-24 ENCOUNTER — Telehealth: Payer: Self-pay | Admitting: Family

## 2014-05-24 NOTE — Telephone Encounter (Signed)
Se letter

## 2014-05-24 NOTE — Telephone Encounter (Signed)
Caller name: Deondria Relation to pt: self Call back number: [phone] Pharmacy:  Reason for call:   Patient is requesting to be excused from jury duty due to health issues. Patient has nurse visit scheduled for tomorrow and is hoping to pick up letter at that time.

## 2014-05-24 NOTE — Telephone Encounter (Signed)
Please advise 

## 2014-05-25 ENCOUNTER — Ambulatory Visit (INDEPENDENT_AMBULATORY_CARE_PROVIDER_SITE_OTHER): Payer: Medicare Other

## 2014-05-25 ENCOUNTER — Telehealth: Payer: Self-pay | Admitting: Family

## 2014-05-25 DIAGNOSIS — Z23 Encounter for immunization: Secondary | ICD-10-CM | POA: Diagnosis not present

## 2014-05-25 DIAGNOSIS — Z1239 Encounter for other screening for malignant neoplasm of breast: Secondary | ICD-10-CM

## 2014-05-25 NOTE — Telephone Encounter (Signed)
ITIS TIME FOR HER MAMMOGRAM  PLEASE ENTER AN ORDER

## 2014-05-26 DIAGNOSIS — C189 Malignant neoplasm of colon, unspecified: Secondary | ICD-10-CM | POA: Diagnosis not present

## 2014-05-26 NOTE — Telephone Encounter (Signed)
Spoke with pt and will leave letter at front desk for her to pick up.

## 2014-05-26 NOTE — Telephone Encounter (Signed)
Notified pt and she states she will call to arrange appt.

## 2014-05-26 NOTE — Telephone Encounter (Signed)
Spoke with pt, she would like to have mammogram at [facility] in [facility].

## 2014-05-26 NOTE — Telephone Encounter (Signed)
Order placed

## 2014-06-02 ENCOUNTER — Telehealth: Payer: Self-pay | Admitting: *Deleted

## 2014-06-02 DIAGNOSIS — Z634 Disappearance and death of family member: Secondary | ICD-10-CM | POA: Diagnosis not present

## 2014-06-02 DIAGNOSIS — F321 Major depressive disorder, single episode, moderate: Secondary | ICD-10-CM | POA: Diagnosis not present

## 2014-06-02 MED ORDER — MONTELUKAST SODIUM 10 MG PO TABS: 10.0000 mg | ORAL_TABLET | Freq: Every day | ORAL | Status: DC

## 2014-06-02 MED ORDER — BUDESONIDE-FORMOTEROL FUMARATE 160-4.5 MCG/ACT IN AERO: 2.0000 | INHALATION_SPRAY | Freq: Two times a day (BID) | RESPIRATORY_TRACT | Status: DC

## 2014-06-02 NOTE — Telephone Encounter (Signed)
REceived fax requests for symbicort and singulair.  Refills sent.

## 2014-06-05 ENCOUNTER — Telehealth: Payer: Self-pay | Admitting: *Deleted

## 2014-06-05 NOTE — Telephone Encounter (Signed)
Received fax from [facility] for refill of Zolpidem. Form completed and forwarded to supervising PRovider for signature.

## 2014-06-09 ENCOUNTER — Ambulatory Visit (HOSPITAL_BASED_OUTPATIENT_CLINIC_OR_DEPARTMENT_OTHER): Payer: Medicare Other

## 2014-06-16 ENCOUNTER — Ambulatory Visit: Payer: Medicare Other | Admitting: Family

## 2014-06-20 DIAGNOSIS — F321 Major depressive disorder, single episode, moderate: Secondary | ICD-10-CM | POA: Diagnosis not present

## 2014-06-20 DIAGNOSIS — Z634 Disappearance and death of family member: Secondary | ICD-10-CM | POA: Diagnosis not present

## 2014-06-23 ENCOUNTER — Ambulatory Visit (HOSPITAL_BASED_OUTPATIENT_CLINIC_OR_DEPARTMENT_OTHER)
Admission: RE | Admit: 2014-06-23 | Discharge: 2014-06-23 | Disposition: A | Discharge status: Home / Self Care | Payer: Medicare Other | Source: Ambulatory Visit | Attending: Diagnostic Radiology | Admitting: Diagnostic Radiology

## 2014-06-23 DIAGNOSIS — Z1231 Encounter for screening mammogram for malignant neoplasm of breast: Secondary | ICD-10-CM | POA: Diagnosis not present

## 2014-06-23 DIAGNOSIS — Z1239 Encounter for other screening for malignant neoplasm of breast: Secondary | ICD-10-CM

## 2014-08-08 ENCOUNTER — Other Ambulatory Visit: Payer: Self-pay | Admitting: Family

## 2014-08-09 NOTE — Telephone Encounter (Signed)
90 day supply of levothyroxine sent to mail order. Pt is past due for follow up and will need to be seen before further refills are due.  Please call pt to arrange appt.

## 2014-09-01 ENCOUNTER — Encounter: Payer: Self-pay | Admitting: Physician Assistant

## 2014-09-01 ENCOUNTER — Ambulatory Visit (INDEPENDENT_AMBULATORY_CARE_PROVIDER_SITE_OTHER): Payer: Medicare Other | Admitting: Physician Assistant

## 2014-09-01 VITALS — BP 148/92 | HR 84 | Temp 97.6°F | Resp 18 | Ht 63.0 in | Wt 185.1 lb

## 2014-09-01 DIAGNOSIS — J209 Acute bronchitis, unspecified: Secondary | ICD-10-CM

## 2014-09-01 MED ORDER — IPRATROPIUM-ALBUTEROL 0.5-2.5 (3) MG/3ML IN SOLN
3.0000 mL | RESPIRATORY_TRACT | Status: DC
  Administered 2014-09-01: 3 mL via RESPIRATORY_TRACT

## 2014-09-01 MED ORDER — HYDROCOD POLST-CHLORPHEN POLST 10-8 MG/5ML PO LQCR: 5.0000 mL | Freq: Two times a day (BID) | ORAL | Status: DC | PRN

## 2014-09-01 MED ORDER — DOXYCYCLINE MONOHYDRATE 100 MG PO CAPS: 100.0000 mg | ORAL_CAPSULE | Freq: Two times a day (BID) | ORAL | Status: DC

## 2014-09-01 NOTE — Patient Instructions (Signed)
Please take antibiotic as directed.  Stay well hydrated.  Continue Plain Mucinex. Use the Tussionex as directed for cough.  Continue asthma medications as directed.  Call or return to clinic if symptoms are not improving over the next 48 hours.  Asthma, Acute Bronchospasm Acute bronchospasm caused by asthma is also referred to as an asthma attack. Bronchospasm means your air passages become narrowed. The narrowing is caused by inflammation and tightening of the muscles in the air tubes (bronchi) in your lungs. This can make it hard to breathe or cause you to wheeze and cough. CAUSES Possible triggers are:  Animal dander from the skin, hair, or feathers of animals.  Dust mites contained in house dust.  Cockroaches.  Pollen from trees or grass.  Mold.  Cigarette or tobacco smoke.  Air pollutants such as dust, household cleaners, hair sprays, aerosol sprays, paint fumes, strong chemicals, or strong odors.  Cold air or weather changes. Cold air may trigger inflammation. Winds increase molds and pollens in the air.  Strong emotions such as crying or laughing hard.  Stress.  Certain medicines such as aspirin or beta-blockers.  Sulfites in foods and drinks, such as dried fruits and wine.  Infections or inflammatory conditions, such as a flu, cold, or inflammation of the nasal membranes (rhinitis).  Gastroesophageal reflux disease (GERD). GERD is a condition where stomach acid backs up into your esophagus.  Exercise or strenuous activity. SIGNS AND SYMPTOMS   Wheezing.  Excessive coughing, particularly at night.  Chest tightness.  Shortness of breath. DIAGNOSIS  Your health care provider will ask you about your medical history and perform a physical exam. A chest X-ray or blood testing may be performed to look for other causes of your symptoms or other conditions that may have triggered your asthma attack. TREATMENT  Treatment is aimed at reducing inflammation and opening up  the airways in your lungs. Most asthma attacks are treated with inhaled medicines. These include quick relief or rescue medicines (such as bronchodilators) and controller medicines (such as inhaled corticosteroids). These medicines are sometimes given through an inhaler or a nebulizer. Systemic steroid medicine taken by mouth or given through an IV tube also can be used to reduce the inflammation when an attack is moderate or severe. Antibiotic medicines are only used if a bacterial infection is present.  HOME CARE INSTRUCTIONS   Rest.  Drink plenty of liquids. This helps the mucus to remain thin and be easily coughed up. Only use caffeine in moderation and do not use alcohol until you have recovered from your illness.  Do not smoke. Avoid being exposed to secondhand smoke.  You play a critical role in keeping yourself in good health. Avoid exposure to things that cause you to wheeze or to have breathing problems.  Keep your medicines up-to-date and available. Carefully follow your health care provider's treatment plan.  Take your medicine exactly as prescribed.  When pollen or pollution is bad, keep windows closed and use an air conditioner or go to places with air conditioning.  Asthma requires careful medical care. See your health care provider for a follow-up as advised. If you are more than [redacted] weeks pregnant and you were prescribed any new medicines, let your obstetrician know about the visit and how you are doing. Follow up with your health care provider as directed.  After you have recovered from your asthma attack, make an appointment with your outpatient doctor to talk about ways to reduce the likelihood of future attacks. If  you do not have a doctor who manages your asthma, make an appointment with a primary care doctor to discuss your asthma. SEEK IMMEDIATE MEDICAL CARE IF:   You are getting worse.  You have trouble breathing. If severe, call your local emergency services (911 in  the [place]  You develop chest pain or discomfort.  You are vomiting.  You are not able to keep fluids down.  You are coughing up yellow, green, brown, or bloody sputum.  You have a fever and your symptoms suddenly get worse.  You have trouble swallowing. MAKE SURE YOU:   Understand these instructions.  Will watch your condition.  Will get help right away if you are not doing well or get worse. Document Released: 11/26/2006 Document Revised: 08/16/2013 Document Reviewed: 02/16/2013 [facility] Patient Information 2015 [place], [place]. This information is not intended to replace advice given to you by your health care provider. Make sure you discuss any questions you have with your health care provider.

## 2014-09-01 NOTE — Progress Notes (Signed)
Pre visit review using our clinic review tool, if applicable. No additional management support is needed unless otherwise documented below in the visit note/SLS  

## 2014-09-01 NOTE — Progress Notes (Signed)
  Subjective:     Sheryl [name] is a 69 y.o. female who presents for evaluation of symptoms of a URI, follow up on a URI. Symptoms include left ear pressure/pain, congestion, low grade fever, nasal congestion, productive cough with  green colored sputum, shortness of breath, sore throat and wheezing. Onset of symptoms was 2 weeks ago, and has been gradually worsening since that time. Treatment to date: antibiotics, cough suppressants and oral steroids.  Patient with history of asthma requiring long-acting medication.  The following portions of the patient's history were reviewed and updated as appropriate: allergies, current medications, past family history, past medical history, past social history, past surgical history and problem list.  Review of Systems Pertinent items are noted in HPI.   Objective:    BP 148/92 mmHg  Pulse 84  Temp(Src) 97.6 F (36.4 C) (Oral)  Resp 18  Ht 5\' 3"  (1.6 m)  Wt 185 lb 2 oz (83.972 kg)  BMI 32.80 kg/m2  SpO2 96% General appearance: alert, cooperative, appears stated age and no distress Head: Normocephalic, without obvious abnormality, atraumatic Eyes: conjunctivae/corneas clear. PERRL, EOM's intact. Fundi benign. Ears: normal TM's and external ear canals both ears Nose: Nares normal. Septum midline. Mucosa normal. No drainage or sinus tenderness. Throat: lips, mucosa, and tongue normal; teeth and gums normal Lungs: wheezes bilaterally Heart: regular rate and rhythm, S1, S2 normal, no murmur, click, rub or gallop Lymph nodes: Cervical, supraclavicular, and axillary nodes normal.   Assessment:    bronchitis and bronchospasm   Plan:    Discussed diagnosis and treatment of URI. Nasal saline spray for congestion. Doxycycline per orders. Tussionex per orders.   Continue your Asthma medications as directed. Follow-up if no improvement within 48 hours.

## 2014-09-14 ENCOUNTER — Other Ambulatory Visit: Payer: Self-pay | Admitting: Family

## 2014-09-15 ENCOUNTER — Other Ambulatory Visit: Payer: Medicare Other

## 2014-09-19 ENCOUNTER — Ambulatory Visit (INDEPENDENT_AMBULATORY_CARE_PROVIDER_SITE_OTHER): Payer: Medicare Other | Admitting: Family

## 2014-09-19 ENCOUNTER — Encounter: Payer: Self-pay | Admitting: Family

## 2014-09-19 DIAGNOSIS — R7989 Other specified abnormal findings of blood chemistry: Secondary | ICD-10-CM | POA: Diagnosis not present

## 2014-09-19 DIAGNOSIS — E119 Type 2 diabetes mellitus without complications: Secondary | ICD-10-CM | POA: Diagnosis not present

## 2014-09-19 DIAGNOSIS — E039 Hypothyroidism, unspecified: Secondary | ICD-10-CM

## 2014-09-19 DIAGNOSIS — R945 Abnormal results of liver function studies: Secondary | ICD-10-CM

## 2014-09-19 LAB — HEPATIC FUNCTION PANEL
ALK PHOS: 65 U/L (ref 39–117)
ALT: 24 U/L (ref 0–35)
AST: 36 U/L (ref 0–37)
Albumin: 3.4 g/dL — ABNORMAL LOW (ref 3.5–5.2)
BILIRUBIN DIRECT: 0.1 mg/dL (ref 0.0–0.3)
TOTAL PROTEIN: 8.1 g/dL (ref 6.0–8.3)
Total Bilirubin: 0.4 mg/dL (ref 0.2–1.2)

## 2014-09-19 LAB — TSH: TSH: 5.23 u[IU]/mL — ABNORMAL HIGH (ref 0.35–4.50)

## 2014-09-19 LAB — HEMOGLOBIN A1C: Hgb A1c MFr Bld: 6.5 % (ref 4.6–6.5)

## 2014-09-19 NOTE — Assessment & Plan Note (Signed)
DM stable. Will check A1c and microalbumin today.

## 2014-09-19 NOTE — Assessment & Plan Note (Signed)
Clinically stable on 100 mcg levothyroxine. Check TSH and continue at current dose.

## 2014-09-19 NOTE — Progress Notes (Signed)
Subjective:    Patient ID: Sheryl [name], female    DOB: [birth year], 69 y.o.   MRN: [id number]  HPI 1. DM: Does not take medication or check BS.  Diet: avoids concentrated and complex sugars but does eat a lot of fruit and vegetables. Exercise:  Does yoga, walking, and gardening but nothing lately since she has been sick. Eye exam: reports eye exam in May 2015.  Has occasional episodes of hypoglycemia. She knows the sx and knows to eat to remedy.  Lab Results  Component Value Date   HGBA1C 6.1* 03/10/2014   HGBA1C 6.4* 11/30/2013   HGBA1C 6.1* 05/25/2013   Lab Results  Component Value Date   MICROALBUR 0.50 11/30/2013   LDLCALC 155* 08/31/2013   CREATININE 0.8 05/02/2014   2. Hypothyroidism: Lab Results  Component Value Date   TSH 2.099 11/30/2013   3. Asthma: Recently had bronchitis which she feels is resolved but it exacerbated her asthma and she still has cough which is productive of milky looking mucous. Mucinex helps her to get phlegm up. She does feel that asthma is under control.  She uses Symbicort and singulair daily. Uses rescue inhaler once daily more to prevent SOB than for actual SOB.    4. Hyperlipdemia: Does take medication.  Avoids fat in diet. Lab Results  Component Value Date   CHOL 228* 08/31/2013   HDL 35* 08/31/2013   LDLCALC 155* 08/31/2013   TRIG 191* 08/31/2013   CHOLHDL 6.5 08/31/2013   5. RA:  Her rheumatologist moved and she is currently not going to rheumatologist. She feels that her RA is in remission. She smokes marijuana 3 x week and takes turmeric and feels better than she did then when she was using methotrexate.  6. Hypothyroidism: Compliant with synthroid.  Lab Results  Component Value Date   TSH 2.099 11/30/2013   7. Abnormal liver enzymes: Had abnormal liver enzymes while hospitalized 04/2014 for SBO. Gastroenterologist wanted to do liver bx but she refused. She requests these to be checked today.  Review of Systems    Constitutional: Positive for fatigue. Negative for fever and chills.  Respiratory: Positive for cough. Negative for shortness of breath.   Cardiovascular: Negative for chest pain.  Reports scratchy throat.  Past Medical History  Diagnosis Date  . Hyperlipidemia   . Thyroid disease     hypothyroid  . Anemia     pernicious  . Asthma   . Diabetes mellitus     type 2  . Tobacco abuse   . Sjogren's syndrome 03/17/2011  . Colon cancer   . Rheumatoid arthritis(714.0)   . Pericarditis 07/26/2012  . History of pericarditis 07/26/2012  . Small bowel obstruction 03/2014    ?due to adhesions from colon surgery per pt    History   Social History  . Marital Status: Single    Spouse Name: N/A    Number of Children: 1  . Years of Education: N/A   Occupational History  . unemployed    Social History Main Topics  . Smoking status: Former Smoker    Types: Cigarettes  . Smokeless tobacco: Not on file     Comment: 3 cigarettes a month. Uses smokeless cigarette.  . Alcohol Use: No     Comment: Has not had alcohol since SBO 03/2014  . Drug Use: Yes     Comment: Smokes "medical grade" marijuana 2 puffs 3 x weekly for rheumatoid arthritis.per pt  . Sexual Activity: Not on file  Other Topics Concern  . Not on file   Social History Narrative   Regular exercise:  Stretching exercises. Resistance bands   Caffeine: 1 mug (2cus) daily.          Past Surgical History  Procedure Laterality Date  . Knee surgery  2006    arthroscopic left knee  . Knee surgery  2001    right knee  . Dilation and curettage of uterus  1975  . Colon surgery  8/.23/13    tumor removed from sigmoid colon.    Family History  Problem Relation Age of Onset  . Heart disease Other     CAD  . Diabetes Other   . Hyperlipidemia Other   . Stroke Other     Allergies  Allergen Reactions  . Ativan [Lorazepam]     Visual hallucinations.   . Cefuroxime Axetil     REACTION: asthma, cough  . Levofloxacin   .  Statins     Muscle pain, syncope    Current Outpatient Prescriptions on File Prior to Visit  Medication Sig Dispense Refill  . NON FORMULARY TUMERIC MILK.    Marland Kitchen albuterol (VENTOLIN HFA) 108 (90 BASE) MCG/ACT inhaler Inhale 2 puffs into the lungs every 6 (six) hours as needed. (Patient not taking: Reported on 09/19/2014) 3 Inhaler 1  . aspirin 81 MG tablet Take 81 mg by mouth daily.      . budesonide-formoterol (SYMBICORT) 160-4.5 MCG/ACT inhaler Inhale 2 puffs into the lungs 2 (two) times daily. (Patient not taking: Reported on 09/19/2014) 3 Inhaler 1  . diclofenac (VOLTAREN) 75 MG EC tablet Take 1 tablet (75 mg total) by mouth 2 (two) times daily as needed. (Patient not taking: Reported on 09/19/2014) 30 tablet 0  . fish oil-omega-3 fatty acids 1000 MG capsule Take 2 capsules (2 g total) by mouth 2 (two) times daily. (Patient not taking: Reported on 09/19/2014)    . fluticasone (FLONASE) 50 MCG/ACT nasal spray Place 2 sprays into the nose daily as needed.    Marland Kitchen levothyroxine (SYNTHROID, LEVOTHROID) 100 MCG tablet TAKE 1 TABLET DAILY 5 DAYS A WEEK AND 1/2 TABLET DAILY 2 DAYS A WEEK  (Patient not taking: Reported on 09/19/2014) 78 tablet 0  . methylPREDNISolone (MEDROL DOSEPAK) 4 MG tablet Take by mouth as directed. follow package directions    . montelukast (SINGULAIR) 10 MG tablet Take 1 tablet (10 mg total) by mouth at bedtime. (Patient not taking: Reported on 09/19/2014) 90 tablet 1  . multivitamin (THERAGRAN) per tablet Take 1 tablet by mouth daily.      Marland Kitchen nystatin (MYCOSTATIN) 100000 UNIT/ML suspension TAKE 5 MLS BY MOUTH 4 TIMES DAILY. (Patient not taking: Reported on 09/19/2014) 180 mL 0  . ondansetron (ZOFRAN-ODT) 8 MG disintegrating tablet Take 1 tablet (8 mg total) by mouth every 8 (eight) hours as needed for nausea. (Patient not taking: Reported on 09/19/2014) 20 tablet 3  . pantoprazole (PROTONIX) 40 MG tablet TAKE 1 TABLET EVERY DAY (Patient not taking: Reported on 09/19/2014) 90 tablet 1  .  zolpidem (AMBIEN) 5 MG tablet Take 1 tablet (5 mg total) by mouth at bedtime as needed for sleep. (Patient not taking: Reported on 09/19/2014) 90 tablet 1   Current Facility-Administered Medications on File Prior to Visit  Medication Dose Route Frequency Provider Last Rate Last Dose  . ipratropium-albuterol (DUONEB) 0.5-2.5 (3) MG/3ML nebulizer solution 3 mL  3 mL Nebulization Q4H Brunetta Jeans, PA-C   3 mL at 09/01/14 1651  BP 144/87 mmHg  Pulse 86  Temp(Src) 98.1 F (36.7 C) (Oral)  Resp 16  Wt 185 lb 6.4 oz (84.097 kg)  SpO2 98%       Objective:   Physical Exam  Constitutional: She is oriented to person, place, and time. She appears well-developed and well-nourished. No distress.  HENT:  Head: Normocephalic and atraumatic.  Cardiovascular: Normal rate, regular rhythm, normal heart sounds and intact distal pulses.  Exam reveals no gallop and no friction rub.   No murmur heard. Pulmonary/Chest: Effort normal and breath sounds normal. No respiratory distress. She has no wheezes. She has no rales.  Neurological: She is alert and oriented to person, place, and time.  Skin: Skin is warm and dry. She is not diaphoretic.  Psychiatric: She has a normal mood and affect. Her behavior is normal. Judgment and thought content normal.          Assessment & Plan:  Patient seen along with [facility] NP-student.  I have personally seen and examined patient and agree with Ms. Arzella Rehmann's assessment and plan- asthma remains stable on current meds- continue same,  Obtain TSH,  continue low cholesterol diet for hyperlipidemia, She declines Rheumatology referral at this time and recently had round of prednisone for bronchitis, little value in checking ESR.  Obtain a1c to assess glycemic control. [name] NP

## 2014-09-19 NOTE — Assessment & Plan Note (Signed)
>>  ASSESSMENT AND PLAN FOR HYPERLIPIDEMIA WRITTEN ON 09/19/2014 11:03 AM BY WHITMIRE, DAWN W  Unable to tolerate statins or zetia due to side effects. Reports eating low fat diet. LDL not at goal.

## 2014-09-19 NOTE — Assessment & Plan Note (Signed)
Unable to tolerate statins or zetia due to side effects. Reports eating low fat diet. LDL not at goal.

## 2014-09-19 NOTE — Assessment & Plan Note (Signed)
Stable on Symbicort and singulair. Continue.

## 2014-09-19 NOTE — Patient Instructions (Signed)
Please complete lab work prior to leaving. Follow up in 4 months.  

## 2014-09-19 NOTE — Assessment & Plan Note (Signed)
RA symptoms are under control. Her rheumatologist moved and she is currently not going to rheumatologist. Does not want referral at this time. Smokes marijuana 3 x week and takes turmeric and feels RA is in remission.

## 2014-09-19 NOTE — Progress Notes (Signed)
Pre visit review using our clinic review tool, if applicable. No additional management support is needed unless otherwise documented below in the visit note. 

## 2014-09-20 ENCOUNTER — Telehealth: Payer: Self-pay | Admitting: Family

## 2014-09-20 LAB — MICROALBUMIN / CREATININE URINE RATIO
Creatinine,U: 95.3 mg/dL
MICROALB UR: 0.5 mg/dL (ref 0.0–1.9)
Microalb Creat Ratio: 0.5 mg/g (ref 0.0–30.0)

## 2014-09-20 MED ORDER — LEVOTHYROXINE SODIUM 100 MCG PO TABS: ORAL_TABLET | ORAL | Status: DC

## 2014-09-20 NOTE — Telephone Encounter (Signed)
TSH low. Increase synthroid to 139mcg every day. TSH in in 6 weeks dx hypothyroid.

## 2014-09-20 NOTE — Telephone Encounter (Signed)
Called patient with results and need clarification on Synthroid, patient and chart states that she was currently taking the 100 mcg, what dosage are you wanting for the Increase/SLS Please Advise.

## 2014-09-20 NOTE — Telephone Encounter (Signed)
My notes indicate that she was cutting 155mcg pill in 1/2 two days week. She should take 142mcg full tab every day please.

## 2014-09-21 ENCOUNTER — Telehealth: Payer: Self-pay | Admitting: Family

## 2014-09-21 NOTE — Telephone Encounter (Signed)
Please change to ventolin, same instructions.

## 2014-09-21 NOTE — Telephone Encounter (Signed)
Caller name: Avacyn Relation to pt: self Call back number: [phone] Pharmacy:  [place]  Reason for call:   Patient states that her insurance will not cover proair. Alternative is ventolin.   Also, wants to talk to [place] about symbicort. Changed tiers and doubled in price. She wants to know if its possible just to take ventolin and stop symbicort

## 2014-09-21 NOTE — Telephone Encounter (Signed)
Please advise 

## 2014-09-22 NOTE — Telephone Encounter (Signed)
Notified pt and she confirmed directions as below. States she will take 1 whole tablet every day.

## 2014-09-22 NOTE — Telephone Encounter (Signed)
I would not recommend discontinuation of symbicort if possible, this is maintenance med.

## 2014-09-22 NOTE — Telephone Encounter (Signed)
Notified pt and she states that symbicort currently costs her $400 for a 3 month supply and advair costs $600. Advised pt that I will print pt assistance form to complete and determine if she qualifies for free med from manufacturer. Printed [place] [employer] [place] application and forwarded to Provider to sign. Advised pt I will call her when complete. She will need to complete her portion and mail it to the address provided. Form forwarded to Provider for signature.

## 2014-09-22 NOTE — Telephone Encounter (Signed)
Pt also wanted to know if she could take ventolin only and stop symbicort as price doubled on it?

## 2014-09-27 DIAGNOSIS — C189 Malignant neoplasm of colon, unspecified: Secondary | ICD-10-CM | POA: Diagnosis not present

## 2014-09-27 NOTE — Telephone Encounter (Signed)
Form signed and placed at front desk. Left detailed message on home # and to call if any questions.

## 2014-10-30 DIAGNOSIS — F4323 Adjustment disorder with mixed anxiety and depressed mood: Secondary | ICD-10-CM | POA: Diagnosis not present

## 2014-11-27 DIAGNOSIS — F4323 Adjustment disorder with mixed anxiety and depressed mood: Secondary | ICD-10-CM | POA: Diagnosis not present

## 2014-12-11 DIAGNOSIS — F4323 Adjustment disorder with mixed anxiety and depressed mood: Secondary | ICD-10-CM | POA: Diagnosis not present

## 2014-12-20 DIAGNOSIS — C187 Malignant neoplasm of sigmoid colon: Secondary | ICD-10-CM | POA: Diagnosis not present

## 2014-12-20 DIAGNOSIS — Z86010 Personal history of colonic polyps: Secondary | ICD-10-CM | POA: Diagnosis not present

## 2014-12-27 DIAGNOSIS — C189 Malignant neoplasm of colon, unspecified: Secondary | ICD-10-CM | POA: Diagnosis not present

## 2015-01-02 DIAGNOSIS — Z1211 Encounter for screening for malignant neoplasm of colon: Secondary | ICD-10-CM | POA: Diagnosis not present

## 2015-01-02 DIAGNOSIS — D126 Benign neoplasm of colon, unspecified: Secondary | ICD-10-CM | POA: Diagnosis not present

## 2015-01-02 DIAGNOSIS — D120 Benign neoplasm of cecum: Secondary | ICD-10-CM | POA: Diagnosis not present

## 2015-01-02 DIAGNOSIS — Z85038 Personal history of other malignant neoplasm of large intestine: Secondary | ICD-10-CM | POA: Diagnosis not present

## 2015-01-02 DIAGNOSIS — D124 Benign neoplasm of descending colon: Secondary | ICD-10-CM | POA: Diagnosis not present

## 2015-01-02 DIAGNOSIS — Z86010 Personal history of colonic polyps: Secondary | ICD-10-CM | POA: Diagnosis not present

## 2015-01-02 DIAGNOSIS — K640 First degree hemorrhoids: Secondary | ICD-10-CM | POA: Diagnosis not present

## 2015-01-02 DIAGNOSIS — Z9049 Acquired absence of other specified parts of digestive tract: Secondary | ICD-10-CM | POA: Diagnosis not present

## 2015-01-02 DIAGNOSIS — D122 Benign neoplasm of ascending colon: Secondary | ICD-10-CM | POA: Diagnosis not present

## 2015-01-03 ENCOUNTER — Other Ambulatory Visit: Payer: Self-pay | Admitting: Family

## 2015-01-05 DIAGNOSIS — F4323 Adjustment disorder with mixed anxiety and depressed mood: Secondary | ICD-10-CM | POA: Diagnosis not present

## 2015-01-08 ENCOUNTER — Encounter: Payer: Self-pay | Admitting: Family

## 2015-01-08 ENCOUNTER — Ambulatory Visit (INDEPENDENT_AMBULATORY_CARE_PROVIDER_SITE_OTHER): Payer: Medicare Other | Admitting: Family

## 2015-01-08 VITALS — BP 130/80 | HR 76 | Temp 98.0°F | Resp 18 | Ht 62.75 in | Wt 188.2 lb

## 2015-01-08 DIAGNOSIS — R739 Hyperglycemia, unspecified: Secondary | ICD-10-CM

## 2015-01-08 DIAGNOSIS — E119 Type 2 diabetes mellitus without complications: Secondary | ICD-10-CM

## 2015-01-08 DIAGNOSIS — J45909 Unspecified asthma, uncomplicated: Secondary | ICD-10-CM

## 2015-01-08 DIAGNOSIS — D510 Vitamin B12 deficiency anemia due to intrinsic factor deficiency: Secondary | ICD-10-CM | POA: Diagnosis not present

## 2015-01-08 DIAGNOSIS — E785 Hyperlipidemia, unspecified: Secondary | ICD-10-CM | POA: Diagnosis not present

## 2015-01-08 DIAGNOSIS — N952 Postmenopausal atrophic vaginitis: Secondary | ICD-10-CM

## 2015-01-08 DIAGNOSIS — M069 Rheumatoid arthritis, unspecified: Secondary | ICD-10-CM

## 2015-01-08 DIAGNOSIS — E039 Hypothyroidism, unspecified: Secondary | ICD-10-CM | POA: Diagnosis not present

## 2015-01-08 HISTORY — DX: Postmenopausal atrophic vaginitis: N95.2

## 2015-01-08 LAB — CBC WITH DIFFERENTIAL/PLATELET
BASOS PCT: 1 % (ref 0.0–3.0)
Basophils Absolute: 0 10*3/uL (ref 0.0–0.1)
Eosinophils Absolute: 0.1 10*3/uL (ref 0.0–0.7)
Eosinophils Relative: 3.1 % (ref 0.0–5.0)
HCT: 43.5 % (ref 36.0–46.0)
HEMOGLOBIN: 14.7 g/dL (ref 12.0–15.0)
LYMPHS PCT: 32.7 % (ref 12.0–46.0)
Lymphs Abs: 1.6 10*3/uL (ref 0.7–4.0)
MCHC: 33.8 g/dL (ref 30.0–36.0)
MCV: 90.1 fl (ref 78.0–100.0)
MONO ABS: 0.4 10*3/uL (ref 0.1–1.0)
Monocytes Relative: 8 % (ref 3.0–12.0)
NEUTROS ABS: 2.7 10*3/uL (ref 1.4–7.7)
Neutrophils Relative %: 55.2 % (ref 43.0–77.0)
Platelets: 155 10*3/uL (ref 150.0–400.0)
RBC: 4.82 Mil/uL (ref 3.87–5.11)
RDW: 13.8 % (ref 11.5–15.5)
WBC: 4.8 10*3/uL (ref 4.0–10.5)

## 2015-01-08 LAB — TSH: TSH: 1.94 u[IU]/mL (ref 0.35–4.50)

## 2015-01-08 LAB — LIPID PANEL
Cholesterol: 206 mg/dL — ABNORMAL HIGH (ref 0–200)
HDL: 34.2 mg/dL — AB (ref >39.00)
LDL Cholesterol: 136 mg/dL — ABNORMAL HIGH (ref 0–99)
NonHDL: 171.8
Total CHOL/HDL Ratio: 6
Triglycerides: 177 mg/dL — ABNORMAL HIGH (ref 0.0–149.0)
VLDL: 35.4 mg/dL (ref 0.0–40.0)

## 2015-01-08 LAB — HEMOGLOBIN A1C: Hgb A1c MFr Bld: 6.1 % (ref 4.6–6.5)

## 2015-01-08 LAB — HEPATIC FUNCTION PANEL
ALBUMIN: 3.7 g/dL (ref 3.5–5.2)
ALT: 72 U/L — ABNORMAL HIGH (ref 0–35)
AST: 82 U/L — AB (ref 0–37)
Alkaline Phosphatase: 63 U/L (ref 39–117)
Bilirubin, Direct: 0.1 mg/dL (ref 0.0–0.3)
Total Bilirubin: 0.6 mg/dL (ref 0.2–1.2)
Total Protein: 8.1 g/dL (ref 6.0–8.3)

## 2015-01-08 LAB — BASIC METABOLIC PANEL
BUN: 15 mg/dL (ref 6–23)
CALCIUM: 9.2 mg/dL (ref 8.4–10.5)
CO2: 29 mEq/L (ref 19–32)
Chloride: 100 mEq/L (ref 96–112)
Creatinine, Ser: 0.79 mg/dL (ref 0.40–1.20)
GFR: 77.02 mL/min (ref >60.00)
GLUCOSE: 91 mg/dL (ref 70–99)
POTASSIUM: 4.4 meq/L (ref 3.5–5.1)
SODIUM: 133 meq/L — AB (ref 135–145)

## 2015-01-08 MED ORDER — BUDESONIDE-FORMOTEROL FUMARATE 160-4.5 MCG/ACT IN AERO: 2.0000 | INHALATION_SPRAY | Freq: Two times a day (BID) | RESPIRATORY_TRACT | Status: DC

## 2015-01-08 MED ORDER — ALBUTEROL SULFATE HFA 108 (90 BASE) MCG/ACT IN AERS: 2.0000 | INHALATION_SPRAY | Freq: Four times a day (QID) | RESPIRATORY_TRACT | Status: DC | PRN

## 2015-01-08 MED ORDER — MONTELUKAST SODIUM 10 MG PO TABS: 10.0000 mg | ORAL_TABLET | Freq: Every day | ORAL | Status: DC

## 2015-01-08 MED ORDER — PANTOPRAZOLE SODIUM 40 MG PO TBEC: 40.0000 mg | DELAYED_RELEASE_TABLET | Freq: Every day | ORAL | Status: DC

## 2015-01-08 MED ORDER — LEVOTHYROXINE SODIUM 100 MCG PO TABS: 100.0000 ug | ORAL_TABLET | Freq: Every day | ORAL | Status: DC

## 2015-01-08 NOTE — Assessment & Plan Note (Signed)
Obtain a1c, continue healthy diet.

## 2015-01-08 NOTE — Patient Instructions (Addendum)
Please complete lab work prior to leaving. Please schedule a medicare wellness visit at the front desk.  

## 2015-01-08 NOTE — Progress Notes (Signed)
Subjective:    Patient ID: Sheryl [name], female    DOB: [birth year], 69 y.o.   MRN: [id number]  HPI   [name] is a 69 yr old female who presents today for follow up.   DM2-  Pt is currently maintained on the following medications for diabetes: diet only Last A1C was  Lab Results  Component Value Date   HGBA1C 6.5 09/19/2014   Last diabetic eye exam was:  03/24/14 Denies polyuria/polydipsia. Denies hypoglycemia Home glucose readings range: not checking  Hypothyroid-  Currently maintained on synthroid 180mcg. This was changed last visit.  Lab Results  Component Value Date   TSH 5.23* 09/19/2014   Asthma-  Reports recent worsening of asthma symptoms. She is maintained on symbicort and singulair.  Attributes worsening asthma symptoms to allergies. Had episode of bronchitis in January.  Notes that asthma is worse with exercise or if "I get excited." Symptoms improve after use of rescue inhaler. Using albuterol most days.  Taking zyrtec, still has nasal drainage.  She is not using flonase because of "bump that swelled up in my nose." Resolved after she stopped the flonase.  usig an essential oil which helps her breath better, "like vicks vaporub."   RA- not following with Rheumatologist- self treating with marijuana and tumeric.  Declines referral to rheumatology.   Vaginal dryness- reports dry skin on the labia.  Reports that the area is discolored which is not new for her. She reports that she is not sexually active. Denies discharge or odor.   Review of Systems See HPI  Past Medical History  Diagnosis Date  . Hyperlipidemia   . Thyroid disease     hypothyroid  . Anemia     pernicious  . Asthma   . Diabetes mellitus     type 2  . Tobacco abuse   . Sjogren's syndrome 03/17/2011  . Colon cancer   . Rheumatoid arthritis(714.0)   . Pericarditis 07/26/2012  . History of pericarditis 07/26/2012  . Small bowel obstruction 03/2014    ?due to adhesions from colon  surgery per pt    History   Social History  . Marital Status: Single    Spouse Name: N/A  . Number of Children: 1  . Years of Education: N/A   Occupational History  . unemployed    Social History Main Topics  . Smoking status: Former Smoker    Types: Cigarettes  . Smokeless tobacco: Not on file     Comment: 3 cigarettes a month. Uses smokeless cigarette.  . Alcohol Use: No     Comment: Has not had alcohol since SBO 03/2014  . Drug Use: Yes     Comment: Smokes "medical grade" marijuana 2 puffs 3 x weekly for rheumatoid arthritis.per pt  . Sexual Activity: Not on file   Other Topics Concern  . Not on file   Social History Narrative   Regular exercise:  Stretching exercises. Resistance bands   Caffeine: 1 mug (2cus) daily.          Past Surgical History  Procedure Laterality Date  . Knee surgery  2006    arthroscopic left knee  . Knee surgery  2001    right knee  . Dilation and curettage of uterus  1975  . Colon surgery  8/.23/13    tumor removed from sigmoid colon.    Family History  Problem Relation Age of Onset  . Heart disease Other     CAD  . Diabetes Other   .  Hyperlipidemia Other   . Stroke Other     Allergies  Allergen Reactions  . Levofloxacin Shortness Of Breath and Rash  . Ativan [Lorazepam]     Visual hallucinations.   . Cefuroxime Axetil     REACTION: asthma, cough  . Statins     Muscle pain, syncope    Current Outpatient Prescriptions on File Prior to Visit  Medication Sig Dispense Refill  . albuterol (VENTOLIN HFA) 108 (90 BASE) MCG/ACT inhaler Inhale 2 puffs into the lungs every 6 (six) hours as needed. 3 Inhaler 1  . aspirin 81 MG tablet Take 81 mg by mouth daily.      . budesonide-formoterol (SYMBICORT) 160-4.5 MCG/ACT inhaler Inhale 2 puffs into the lungs 2 (two) times daily. 3 Inhaler 1  . diclofenac (VOLTAREN) 75 MG EC tablet Take 1 tablet (75 mg total) by mouth 2 (two) times daily as needed. 30 tablet 0  . fish oil-omega-3  fatty acids 1000 MG capsule Take 2 capsules (2 g total) by mouth 2 (two) times daily.    . fluticasone (FLONASE) 50 MCG/ACT nasal spray Place 2 sprays into the nose daily as needed.    Marland Kitchen levothyroxine (SYNTHROID, LEVOTHROID) 100 MCG tablet TAKE 1 TABLET BY MOUTH ONCE DAILY 90 tablet 1  . montelukast (SINGULAIR) 10 MG tablet TAKE 1 TABLET AT BEDTIME 90 tablet 1  . multivitamin (THERAGRAN) per tablet Take 1 tablet by mouth daily.      . NON FORMULARY TUMERIC MILK.    Marland Kitchen nystatin (MYCOSTATIN) 100000 UNIT/ML suspension TAKE 5 MLS BY MOUTH 4 TIMES DAILY. 180 mL 0  . ondansetron (ZOFRAN-ODT) 8 MG disintegrating tablet Take 1 tablet (8 mg total) by mouth every 8 (eight) hours as needed for nausea. 20 tablet 3  . pantoprazole (PROTONIX) 40 MG tablet TAKE 1 TABLET EVERY DAY 90 tablet 1  . zolpidem (AMBIEN) 5 MG tablet Take 1 tablet (5 mg total) by mouth at bedtime as needed for sleep. 90 tablet 1   Current Facility-Administered Medications on File Prior to Visit  Medication Dose Route Frequency Provider Last Rate Last Dose  . ipratropium-albuterol (DUONEB) 0.5-2.5 (3) MG/3ML nebulizer solution 3 mL  3 mL Nebulization Q4H Brunetta Jeans, PA-C   3 mL at 09/01/14 1651    BP 130/80 mmHg  Pulse 76  Temp(Src) 98 F (36.7 C) (Oral)  Resp 18  Ht 5' 2.75" (1.594 m)  Wt 188 lb 3.2 oz (85.367 kg)  BMI 33.60 kg/m2  SpO2 97%       Objective:   Physical Exam  Constitutional: She is oriented to person, place, and time. She appears well-developed and well-nourished.  HENT:  Head: Normocephalic and atraumatic.  Eyes: No scleral icterus.  Cardiovascular: Normal rate, regular rhythm and normal heart sounds.   No murmur heard. Pulmonary/Chest: Effort normal and breath sounds normal. No respiratory distress. She has no wheezes.  Genitourinary:  Vulvar atrophy noted, some dry skin noted on vulva  Musculoskeletal: She exhibits no edema.  Lymphadenopathy:    She has no cervical adenopathy.  Neurological:  She is alert and oriented to person, place, and time.  Skin: Skin is warm and dry.  Psychiatric: She has a normal mood and affect. Her behavior is normal. Judgment and thought content normal.          Assessment & Plan:

## 2015-01-08 NOTE — Assessment & Plan Note (Signed)
Obtain follow up tsh.  

## 2015-01-08 NOTE — Progress Notes (Signed)
Pre visit review using our clinic review tool, if applicable. No additional management support is needed unless otherwise documented below in the visit note. 

## 2015-01-08 NOTE — Assessment & Plan Note (Signed)
Recent asthma symptoms have been more frequent likely due to allergies. Continue current meds.  If symptoms do not improve, consider referral to pulmonary and/or allergist.

## 2015-01-08 NOTE — Assessment & Plan Note (Signed)
Stable, declines referral to rheumatology.

## 2015-01-08 NOTE — Assessment & Plan Note (Signed)
Wishes to stay away from any hormones. Advised pt re: trial of prn hydrocortisone 1% otc prn for itching/dry skin

## 2015-01-12 ENCOUNTER — Telehealth: Payer: Self-pay | Admitting: Family

## 2015-01-12 DIAGNOSIS — R945 Abnormal results of liver function studies: Secondary | ICD-10-CM

## 2015-01-12 DIAGNOSIS — Z85038 Personal history of other malignant neoplasm of large intestine: Secondary | ICD-10-CM

## 2015-01-12 DIAGNOSIS — R7989 Other specified abnormal findings of blood chemistry: Secondary | ICD-10-CM

## 2015-01-12 NOTE — Telephone Encounter (Signed)
Sodium mildly low, but not new for her. A1C stable in borderline DM range. Continue to work on diet. Thyroid test looks good.  Cholesterol is elevated as well as LFT's. Her previous CT abd noted "fatty liver."  This is most likley cause, however I would recommend that she complete a follow up CT abd/pelvis as well as some additional lab tests to further evaluate her abnormal LFT's.  In the meantime, work on low fat/low cholesterol diet, exercise, weight loss for fatty liver.

## 2015-01-15 ENCOUNTER — Encounter: Payer: Self-pay | Admitting: Family

## 2015-01-16 ENCOUNTER — Other Ambulatory Visit: Payer: Self-pay | Admitting: Family

## 2015-01-16 DIAGNOSIS — C189 Malignant neoplasm of colon, unspecified: Secondary | ICD-10-CM

## 2015-01-16 DIAGNOSIS — R7989 Other specified abnormal findings of blood chemistry: Secondary | ICD-10-CM

## 2015-01-16 DIAGNOSIS — R945 Abnormal results of liver function studies: Secondary | ICD-10-CM

## 2015-01-16 NOTE — Telephone Encounter (Signed)
Notified pt and she voices understanding. Advised her per verbal from Provider that it is ok to add salt to her food and will recheck sodium level at next appt on 01/31/15.  Future lab orders entered.

## 2015-01-16 NOTE — Telephone Encounter (Signed)
Pt called back stating [facility] contacted her to schedule CT. Pt also concerned that Dr Verner Chol (oncologist) will not be able to do her whole body scan in August if we do CT now. Advised pt per verbal from PCP we need to do CT now to assess abnormal LFT finding and we can forward result to Dr Baird Cancer. Pt voices understanding and is agreeable to proceed with CT now.  Confirmed that [facility] contacted pt re: CT appt and transferred pt to them.

## 2015-01-16 NOTE — Telephone Encounter (Signed)
Sheryl [name]--  Anderson Malta said insurance will not approve CT for elevated LFTs. Will need to list primary DX as history of colon cancer then abnormal LFTs. Can you replace order if ok to proceed?

## 2015-01-16 NOTE — Telephone Encounter (Signed)
Left message on cell# to return my call.

## 2015-01-17 ENCOUNTER — Other Ambulatory Visit (INDEPENDENT_AMBULATORY_CARE_PROVIDER_SITE_OTHER): Payer: Medicare Other

## 2015-01-17 ENCOUNTER — Encounter: Payer: Self-pay | Admitting: Family

## 2015-01-17 DIAGNOSIS — R945 Abnormal results of liver function studies: Secondary | ICD-10-CM

## 2015-01-17 DIAGNOSIS — R7989 Other specified abnormal findings of blood chemistry: Secondary | ICD-10-CM | POA: Diagnosis not present

## 2015-01-17 LAB — HEPATIC FUNCTION PANEL
ALK PHOS: 68 U/L (ref 39–117)
ALT: 50 U/L — ABNORMAL HIGH (ref 0–35)
AST: 44 U/L — ABNORMAL HIGH (ref 0–37)
Albumin: 3.7 g/dL (ref 3.5–5.2)
Bilirubin, Direct: 0.1 mg/dL (ref 0.0–0.3)
Total Bilirubin: 0.3 mg/dL (ref 0.2–1.2)
Total Protein: 7.7 g/dL (ref 6.0–8.3)

## 2015-01-17 LAB — FERRITIN: FERRITIN: 44.6 ng/mL (ref 10.0–291.0)

## 2015-01-17 NOTE — Addendum Note (Signed)
Addended by: Kelle Darting A on: 01/17/2015 11:16 AM   Modules accepted: Orders

## 2015-01-17 NOTE — Addendum Note (Signed)
Addended by: [name] on: 01/17/2015 08:12 AM   Modules accepted: Orders

## 2015-01-17 NOTE — Telephone Encounter (Signed)
Diagnosis changed 

## 2015-01-18 ENCOUNTER — Encounter (HOSPITAL_BASED_OUTPATIENT_CLINIC_OR_DEPARTMENT_OTHER): Payer: Self-pay

## 2015-01-18 ENCOUNTER — Ambulatory Visit (HOSPITAL_BASED_OUTPATIENT_CLINIC_OR_DEPARTMENT_OTHER)
Admission: RE | Admit: 2015-01-18 | Discharge: 2015-01-18 | Disposition: A | Discharge status: Home / Self Care | Payer: Medicare Other | Source: Ambulatory Visit | Attending: Family | Admitting: Family

## 2015-01-18 ENCOUNTER — Ambulatory Visit (HOSPITAL_BASED_OUTPATIENT_CLINIC_OR_DEPARTMENT_OTHER): Payer: PRIVATE HEALTH INSURANCE

## 2015-01-18 DIAGNOSIS — R7989 Other specified abnormal findings of blood chemistry: Secondary | ICD-10-CM

## 2015-01-18 DIAGNOSIS — K8020 Calculus of gallbladder without cholecystitis without obstruction: Secondary | ICD-10-CM | POA: Insufficient documentation

## 2015-01-18 DIAGNOSIS — K760 Fatty (change of) liver, not elsewhere classified: Secondary | ICD-10-CM | POA: Insufficient documentation

## 2015-01-18 DIAGNOSIS — Z85038 Personal history of other malignant neoplasm of large intestine: Secondary | ICD-10-CM

## 2015-01-18 DIAGNOSIS — R945 Abnormal results of liver function studies: Secondary | ICD-10-CM

## 2015-01-18 MED ORDER — IOHEXOL 300 MG/ML  SOLN
100.0000 mL | Freq: Once | INTRAMUSCULAR | Status: AC | PRN
Start: 2015-01-18 — End: 2015-01-18
  Administered 2015-01-18: 100 mL via INTRAVENOUS

## 2015-01-19 ENCOUNTER — Encounter: Payer: Self-pay | Admitting: Family

## 2015-01-19 ENCOUNTER — Ambulatory Visit: Payer: PRIVATE HEALTH INSURANCE | Admitting: Family

## 2015-01-19 DIAGNOSIS — K760 Fatty (change of) liver, not elsewhere classified: Secondary | ICD-10-CM

## 2015-01-19 HISTORY — DX: Fatty (change of) liver, not elsewhere classified: K76.0

## 2015-01-30 ENCOUNTER — Telehealth: Payer: Self-pay | Admitting: *Deleted

## 2015-01-30 NOTE — Telephone Encounter (Signed)
Unable to reach patient at time of Pre-Visit Call.  Left message for patient to return call when available.    

## 2015-01-31 ENCOUNTER — Other Ambulatory Visit (HOSPITAL_COMMUNITY)
Admission: RE | Admit: 2015-01-31 | Discharge: 2015-01-31 | Disposition: A | Discharge status: Home / Self Care | Payer: Medicare Other | Source: Ambulatory Visit | Attending: Family | Admitting: Family

## 2015-01-31 ENCOUNTER — Ambulatory Visit (INDEPENDENT_AMBULATORY_CARE_PROVIDER_SITE_OTHER): Payer: Medicare Other | Admitting: Family

## 2015-01-31 ENCOUNTER — Encounter: Payer: Self-pay | Admitting: Family

## 2015-01-31 VITALS — BP 130/80 | HR 80 | Temp 97.7°F | Resp 18 | Ht 62.0 in | Wt 192.0 lb

## 2015-01-31 DIAGNOSIS — E538 Deficiency of other specified B group vitamins: Secondary | ICD-10-CM

## 2015-01-31 DIAGNOSIS — Z01419 Encounter for gynecological examination (general) (routine) without abnormal findings: Secondary | ICD-10-CM

## 2015-01-31 DIAGNOSIS — Z01411 Encounter for gynecological examination (general) (routine) with abnormal findings: Secondary | ICD-10-CM | POA: Insufficient documentation

## 2015-01-31 DIAGNOSIS — D510 Vitamin B12 deficiency anemia due to intrinsic factor deficiency: Secondary | ICD-10-CM

## 2015-01-31 DIAGNOSIS — Z0000 Encounter for general adult medical examination without abnormal findings: Secondary | ICD-10-CM | POA: Diagnosis not present

## 2015-01-31 DIAGNOSIS — M069 Rheumatoid arthritis, unspecified: Secondary | ICD-10-CM

## 2015-01-31 DIAGNOSIS — E039 Hypothyroidism, unspecified: Secondary | ICD-10-CM | POA: Diagnosis not present

## 2015-01-31 DIAGNOSIS — E785 Hyperlipidemia, unspecified: Secondary | ICD-10-CM | POA: Diagnosis not present

## 2015-01-31 DIAGNOSIS — E2839 Other primary ovarian failure: Secondary | ICD-10-CM

## 2015-01-31 DIAGNOSIS — J45909 Unspecified asthma, uncomplicated: Secondary | ICD-10-CM

## 2015-01-31 DIAGNOSIS — N952 Postmenopausal atrophic vaginitis: Secondary | ICD-10-CM

## 2015-01-31 DIAGNOSIS — F329 Major depressive disorder, single episode, unspecified: Secondary | ICD-10-CM

## 2015-01-31 DIAGNOSIS — C189 Malignant neoplasm of colon, unspecified: Secondary | ICD-10-CM

## 2015-01-31 DIAGNOSIS — F32A Depression, unspecified: Secondary | ICD-10-CM

## 2015-01-31 LAB — VITAMIN B12: Vitamin B-12: 247 pg/mL (ref 211–911)

## 2015-01-31 LAB — TSH: TSH: 1.33 u[IU]/mL (ref 0.35–4.50)

## 2015-01-31 LAB — SEDIMENTATION RATE: Sed Rate: 52 mm/hr — ABNORMAL HIGH (ref 0–22)

## 2015-01-31 NOTE — Progress Notes (Signed)
Subjective:    Patient ID: Sheryl [name], female    DOB: [birth year], 69 y.o.   MRN: [id number]  HPI    Review of Systems     Objective:   Physical Exam        Assessment & Plan:   Subjective:    Sheryl [name] is a 69 y.o. female who presents for Medicare Annual/Subsequent preventive examination.  Preventive Screening-Counseling & Management Immunizations: due for zostavax, Diet: reports healthy diet except for sweets Exercise: works in her garden a few times a week. No formal cardio.  Occasional pilates Colonoscopy:  01/02/15 Dexa: due Pap Smear: due Mammogram:06/23/14    Tobacco History  Smoking status  . Former Smoker  . Types: Cigarettes  Smokeless tobacco  . Not on file    Comment: 3 cigarettes a month. Uses smokeless cigarette.     Problems Prior to Visit 1.  Asthma- reports asthma symptoms are well controlled.   2. Depression- denies current issues with depression  3.  Pernicious anemia- not taking a b12 supplement.  Lab Results  Component Value Date   WBC 4.8 01/08/2015   HGB 14.7 01/08/2015   HCT 43.5 01/08/2015   MCV 90.1 01/08/2015   PLT 155.0 01/08/2015   4.  Hyperlipidemia-  Unable to take statins Lab Results  Component Value Date   CHOL 206* 01/08/2015   HDL 34.20* 01/08/2015   LDLCALC 136* 01/08/2015   TRIG 177.0* 01/08/2015   CHOLHDL 6 01/08/2015   5. Sjogren's/RA- has seen Dr. Abner Greenspan in the past. Reports that she wishes to avoid biologics.  Feels like RA is in remission.   6.  Hypothyroid-  Maintained on synthroid.   Lab Results  Component Value Date   TSH 1.94 01/08/2015   7. Hx of colon CA- following with GI- Dr. Marin Comment and recently had follow up colo   Current Problems (verified) Patient Active Problem List   Diagnosis Date Noted  . Fatty liver 01/19/2015  . Vaginal atrophy 01/08/2015  . SBO (small bowel obstruction) 05/02/2014  . Colon cancer 05/26/2012  . Psoriasis 12/29/2011  . Sjogren's syndrome  03/17/2011  . General medical examination 12/25/2010  . Insomnia 12/25/2010  . SKIN LESION 09/17/2010  . UNSPECIFIED VITAMIN D DEFICIENCY 05/15/2010  . Asthma 12/26/2009  . Diabetes type 2, controlled 09/10/2009  . DEPRESSIVE DISORDER NOT ELSEWHERE CLASSIFIED 09/10/2009  . ALLERGIC RHINITIS 09/10/2009  . ELEVATED BLOOD PRESSURE WITHOUT DIAGNOSIS OF HYPERTENSION 09/10/2009  . Hypothyroidism 06/05/2009  . HYPERLIPIDEMIA 06/05/2009  . PERNICIOUS ANEMIA 06/05/2009  . History of tobacco abuse 06/05/2009  . GERD 06/05/2009  . Rheumatoid arthritis 06/05/2009    Medications Prior to Visit Current Outpatient Prescriptions on File Prior to Visit  Medication Sig Dispense Refill  . albuterol (VENTOLIN HFA) 108 (90 BASE) MCG/ACT inhaler Inhale 2 puffs into the lungs every 6 (six) hours as needed. 3 Inhaler 1  . aspirin 81 MG tablet Take 81 mg by mouth daily.      . budesonide-formoterol (SYMBICORT) 160-4.5 MCG/ACT inhaler Inhale 2 puffs into the lungs 2 (two) times daily. 3 Inhaler 1  . diclofenac (VOLTAREN) 75 MG EC tablet Take 1 tablet (75 mg total) by mouth 2 (two) times daily as needed. 30 tablet 0  . fish oil-omega-3 fatty acids 1000 MG capsule Take 2 capsules (2 g total) by mouth 2 (two) times daily.    . fluticasone (FLONASE) 50 MCG/ACT nasal spray Place 2 sprays into the nose daily as needed.    Marland Kitchen  levothyroxine (SYNTHROID, LEVOTHROID) 100 MCG tablet Take 1 tablet (100 mcg total) by mouth daily. 90 tablet 1  . montelukast (SINGULAIR) 10 MG tablet Take 1 tablet (10 mg total) by mouth at bedtime. 90 tablet 1  . multivitamin (THERAGRAN) per tablet Take 1 tablet by mouth daily.      . NON FORMULARY TUMERIC MILK.    Marland Kitchen nystatin (MYCOSTATIN) 100000 UNIT/ML suspension TAKE 5 MLS BY MOUTH 4 TIMES DAILY. 180 mL 0  . ondansetron (ZOFRAN-ODT) 8 MG disintegrating tablet Take 1 tablet (8 mg total) by mouth every 8 (eight) hours as needed for nausea. 20 tablet 3  . pantoprazole (PROTONIX) 40 MG tablet  Take 1 tablet (40 mg total) by mouth daily. 90 tablet 1  . zolpidem (AMBIEN) 5 MG tablet Take 1 tablet (5 mg total) by mouth at bedtime as needed for sleep. 90 tablet 1   Current Facility-Administered Medications on File Prior to Visit  Medication Dose Route Frequency Provider Last Rate Last Dose  . ipratropium-albuterol (DUONEB) 0.5-2.5 (3) MG/3ML nebulizer solution 3 mL  3 mL Nebulization Q4H Brunetta Jeans, PA-C   3 mL at 09/01/14 1651    Current Medications (verified) Current Outpatient Prescriptions  Medication Sig Dispense Refill  . albuterol (VENTOLIN HFA) 108 (90 BASE) MCG/ACT inhaler Inhale 2 puffs into the lungs every 6 (six) hours as needed. 3 Inhaler 1  . aspirin 81 MG tablet Take 81 mg by mouth daily.      . budesonide-formoterol (SYMBICORT) 160-4.5 MCG/ACT inhaler Inhale 2 puffs into the lungs 2 (two) times daily. 3 Inhaler 1  . diclofenac (VOLTAREN) 75 MG EC tablet Take 1 tablet (75 mg total) by mouth 2 (two) times daily as needed. 30 tablet 0  . fish oil-omega-3 fatty acids 1000 MG capsule Take 2 capsules (2 g total) by mouth 2 (two) times daily.    . fluticasone (FLONASE) 50 MCG/ACT nasal spray Place 2 sprays into the nose daily as needed.    Marland Kitchen levothyroxine (SYNTHROID, LEVOTHROID) 100 MCG tablet Take 1 tablet (100 mcg total) by mouth daily. 90 tablet 1  . montelukast (SINGULAIR) 10 MG tablet Take 1 tablet (10 mg total) by mouth at bedtime. 90 tablet 1  . multivitamin (THERAGRAN) per tablet Take 1 tablet by mouth daily.      . NON FORMULARY TUMERIC MILK.    Marland Kitchen nystatin (MYCOSTATIN) 100000 UNIT/ML suspension TAKE 5 MLS BY MOUTH 4 TIMES DAILY. 180 mL 0  . ondansetron (ZOFRAN-ODT) 8 MG disintegrating tablet Take 1 tablet (8 mg total) by mouth every 8 (eight) hours as needed for nausea. 20 tablet 3  . pantoprazole (PROTONIX) 40 MG tablet Take 1 tablet (40 mg total) by mouth daily. 90 tablet 1  . zolpidem (AMBIEN) 5 MG tablet Take 1 tablet (5 mg total) by mouth at bedtime as  needed for sleep. 90 tablet 1   Current Facility-Administered Medications  Medication Dose Route Frequency Provider Last Rate Last Dose  . ipratropium-albuterol (DUONEB) 0.5-2.5 (3) MG/3ML nebulizer solution 3 mL  3 mL Nebulization Q4H Brunetta Jeans, PA-C   3 mL at 09/01/14 1651     Allergies (verified) Levofloxacin; Ativan; Cefuroxime axetil; and Statins   PAST HISTORY  Family History Family History  Problem Relation Age of Onset  . Heart disease Other     CAD  . Diabetes Other   . Hyperlipidemia Other   . Stroke Other     Social History History  Substance Use Topics  . Smoking  status: Former Smoker    Types: Cigarettes  . Smokeless tobacco: Not on file     Comment: 3 cigarettes a month. Uses smokeless cigarette.  . Alcohol Use: No     Comment: Has not had alcohol since SBO 03/2014     Are there smokers in your home (other than you)? No  Risk Factors Current exercise habits: see above  Dietary issues discussed: continue healthy diet, limit sweets   Cardiac risk factors: advanced age (older than 45 for men, 59 for women), diabetes mellitus and sedentary lifestyle.  Depression Screen (Note: if answer to either of the following is "Yes", a more complete depression screening is indicated)   Over the past two weeks, have you felt down, depressed or hopeless? No  Over the past two weeks, have you felt little interest or pleasure in doing things? No  Have you lost interest or pleasure in daily life? No  Do you often feel hopeless? Feels hopeless about her health   Do you cry easily over simple problems? No  Activities of Daily Living In your present state of health, do you have any difficulty performing the following activities?:  Driving? No Managing money?  No Feeding yourself? No Getting from bed to chair? No. Climbing a flight of stairs? Yes- limited by knee and ankle pain Preparing food and eating?: No Bathing or showering? No Getting dressed: No Getting to  the toilet? No Using the toilet:No Moving around from place to place: No In the past year have you fallen or had a near fall?:No   Are you sexually active?  No  Do you have more than one partner?  No  Hearing Difficulties: No Do you often ask people to speak up or repeat themselves? No Do you experience ringing or noises in your ears? Yes- occasional ringing Do you have difficulty understanding soft or whispered voices? No   Do you feel that you have a problem with memory? No  Do you often misplace items? No  Do you feel safe at home?  Yes  Cognitive Testing  Alert? Yes  Normal Appearance?Yes  Oriented to person? Yes  Place? Yes   Time? Yes  Recall of three objects?  Yes  Can perform simple calculations? Yes  Displays appropriate judgment?Yes  Can read the correct time from a watch face?Yes   Advanced Directives have been discussed with the patient? Yes  List the Names of Other Physician/Practitioners you currently use: 1.  See "snap shot" for list  Indicate any recent Medical Services you may have received from other than [facility] providers in the past year (date may be approximate).  Immunization History  Administered Date(s) Administered  . Influenza Split 07/07/2011  . Influenza Whole 06/05/2009, 04/24/2010  . Influenza,inj,Quad PF,36+ Mos 06/01/2013, 05/25/2014  . Pneumococcal Conjugate-13 06/01/2013  . Pneumococcal Polysaccharide-23 10/01/2010  . Td 08/25/2002  . Tdap 02/14/2013    Screening Tests Health Maintenance  Topic Date Due  . ZOSTAVAX  06/14/2007  . OPHTHALMOLOGY EXAM  03/25/2015  . INFLUENZA VACCINE  03/26/2015  . HEMOGLOBIN A1C  07/11/2015  . FOOT EXAM  09/20/2015  . URINE MICROALBUMIN  09/20/2015  . PNA vac Low Risk Adult (2 of 2 - PPSV23) 10/02/2015  . MAMMOGRAM  06/23/2016  . TETANUS/TDAP  02/15/2023  . COLONOSCOPY  01/01/2025  . DEXA SCAN  Completed    All answers were reviewed with the patient and necessary referrals were  made:  O'SULLIVAN,Monserath Neff S., NP   01/31/2015   History  reviewed: allergies, current medications, past family history, past medical history, past social history, past surgical history and problem list  Review of Systems .    Objective:     Body mass index is 35.11 kg/(m^2). BP 130/80 mmHg  Pulse 80  Temp(Src) 97.7 F (36.5 C) (Oral)  Resp 18  Ht 5\' 2"  (1.575 m)  Wt 192 lb (87.091 kg)  BMI 35.11 kg/m2  SpO2 99%   Assessment:      Physical Exam  Constitutional: She is oriented to person, place, and time. She appears well-developed and well-nourished. No distress.  HENT:  Head: Normocephalic and atraumatic.  Right Ear: Tympanic membrane and ear canal normal.  Left Ear: Tympanic membrane and ear canal normal.  Mouth/Throat: Oropharynx is clear and moist.  Eyes: Pupils are equal, round, and reactive to light. No scleral icterus.  Neck: Normal range of motion. No thyromegaly present.  Cardiovascular: Normal rate and regular rhythm.   No murmur heard. Pulmonary/Chest: Effort normal and breath sounds normal. No respiratory distress. He has no wheezes. She has no rales. She exhibits no tenderness.  Abdominal: Soft. Bowel sounds are normal. He exhibits no distension and no mass. There is no tenderness. There is no rebound and no guarding.  Musculoskeletal: She exhibits no edema.  Lymphadenopathy:    She has no cervical adenopathy.  Neurological: She is alert and oriented to person, place, and time.  She exhibits normal muscle tone. Coordination normal.  Skin: Skin is warm and dry.  Psychiatric: She has a normal mood and affect. Her behavior is normal. Judgment and thought content normal.  Breasts: Examined lying Right: Without masses, retractions, discharge or axillary adenopathy.  Left: Without masses, retractions, discharge or axillary adenopathy.  Inguinal/mons: Normal without inguinal adenopathy  External genitalia: vulvar skin irritation noted  BUS/Urethra/Skene's glands:  Normal  Bladder: Normal  Vagina: Normal  Cervix: Normal  Uterus: normal in size, shape and contour. Midline and mobile  Adnexa/parametria:  Rt: Without masses or tenderness.  Lt: Without masses or tenderness.  Anus and perineum: Normal           Assessment & Plan:      Plan:     During the course of the visit the patient was educated and counseled about appropriate screening and preventive services including:    Screening mammography  Screening Pap smear and pelvic exam   Bone densitometry screening  zostavax  Diet review for nutrition referral? Yes ____  Not Indicated __x__   Patient Instructions (the written plan) was given to the patient.  Medicare Attestation I have personally reviewed: The patient's medical and social history Their use of alcohol, tobacco or illicit drugs Their current medications and supplements The patient's functional ability including ADLs,fall risks, home safety risks, cognitive, and hearing and visual impairment Diet and physical activities Evidence for depression or mood disorders  The patient's weight, height, BMI, and visual acuity have been recorded in the chart.  I have made referrals, counseling, and provided education to the patient based on review of the above and I have provided the patient with a written personalized care plan for preventive services.     O'SULLIVAN,Aceson Labell S., NP   01/31/2015

## 2015-01-31 NOTE — Patient Instructions (Signed)
Please complete lab work prior to leaving. Start diflucan for possible yeast infection. Try to add some water aerobics to your routine. Check with your insurance re: coverage of zostavax (shingles vaccine) then call us to schedule a nurse visit for vaccine. Follow up in 6 months.

## 2015-01-31 NOTE — Progress Notes (Signed)
Pre visit review using our clinic review tool, if applicable. No additional management support is needed unless otherwise documented below in the visit note. 

## 2015-02-01 ENCOUNTER — Telehealth: Payer: Self-pay | Admitting: Family

## 2015-02-01 DIAGNOSIS — E538 Deficiency of other specified B group vitamins: Secondary | ICD-10-CM

## 2015-02-01 MED ORDER — B-12 1000 MCG PO CAPS: 1000.0000 ug | ORAL_CAPSULE | Freq: Every day | ORAL | Status: DC

## 2015-02-01 NOTE — Assessment & Plan Note (Signed)
Stable, monitor.  °

## 2015-02-01 NOTE — Assessment & Plan Note (Signed)
Stable, monitro.

## 2015-02-01 NOTE — Assessment & Plan Note (Signed)
Not currently on b12 injections. Will check b12 level

## 2015-02-01 NOTE — Telephone Encounter (Signed)
See mychart.  

## 2015-02-01 NOTE — Telephone Encounter (Signed)
Please let pt know that b12 is borderline low.  I would like her to add b12 1038mcg tabs once daily- otc, repeat b12 level in 6 weeks. Dx b12 deficiency. Thyroid looks good. Sed rate elevated. Advise pt to re-establish with rheumatology.

## 2015-02-01 NOTE — Assessment & Plan Note (Signed)
>>  ASSESSMENT AND PLAN FOR HYPERLIPIDEMIA WRITTEN ON 02/01/2015  8:36 AM BY O'SULLIVAN, Shantika Bermea, NP  Intolerant to statins. Discussed diet/exercise, weight loss.

## 2015-02-01 NOTE — Assessment & Plan Note (Signed)
Continue follow up with GI. Colo up to date.

## 2015-02-01 NOTE — Assessment & Plan Note (Signed)
External skin irritation noted.  ? Due to yeast. Trial of diflucan.

## 2015-02-01 NOTE — Assessment & Plan Note (Signed)
Intolerant to statins. Discussed diet/exercise, weight loss.

## 2015-02-01 NOTE — Assessment & Plan Note (Signed)
Obtain tsh, continue synthroid. clinically stable.

## 2015-02-01 NOTE — Assessment & Plan Note (Signed)
I have advised her to re-establish with Dr. Gay.  Check ESR. Clinically stable.  

## 2015-02-02 ENCOUNTER — Other Ambulatory Visit (HOSPITAL_BASED_OUTPATIENT_CLINIC_OR_DEPARTMENT_OTHER): Payer: PRIVATE HEALTH INSURANCE

## 2015-02-02 NOTE — Telephone Encounter (Signed)
Notified pt and she voices understanding. Lab appt scheduled for 03/19/15 at 11am and lab order entered.  Pt thought we were going to recheck her bmet due to previous low sodium level?  Per verbal from Provider, ok to add to previous labs. Add on order form faxed to the lab.

## 2015-02-02 NOTE — Addendum Note (Signed)
Addended by: Kelle Darting A on: 02/02/2015 04:13 PM   Modules accepted: Orders

## 2015-02-05 ENCOUNTER — Other Ambulatory Visit (INDEPENDENT_AMBULATORY_CARE_PROVIDER_SITE_OTHER): Payer: Medicare Other

## 2015-02-05 ENCOUNTER — Telehealth: Payer: Self-pay | Admitting: Family

## 2015-02-05 DIAGNOSIS — E2839 Other primary ovarian failure: Secondary | ICD-10-CM | POA: Diagnosis not present

## 2015-02-05 LAB — BASIC METABOLIC PANEL
BUN: 16 mg/dL (ref 6–23)
CALCIUM: 9.3 mg/dL (ref 8.4–10.5)
CO2: 22 mEq/L (ref 19–32)
Chloride: 103 mEq/L (ref 96–112)
Creatinine, Ser: 0.81 mg/dL (ref 0.40–1.20)
GFR: 74.81 mL/min (ref >60.00)
Glucose, Bld: 82 mg/dL (ref 70–99)
Potassium: 4.5 mEq/L (ref 3.5–5.1)
Sodium: 137 mEq/L (ref 135–145)

## 2015-02-05 LAB — CYTOLOGY - PAP

## 2015-02-05 NOTE — Telephone Encounter (Signed)
Pt called to schedule bone density. Advised I could schedule her at [facility] office. Pt asked for somewhere else. Advised [facility] could do bone density. She said she is not going into the city to do it. She wanted it here at [facility]. Advised that we do not have the capability to do bone density scan in this building. Pt said that she is not going to do it at this time.

## 2015-02-06 ENCOUNTER — Encounter: Payer: Self-pay | Admitting: *Deleted

## 2015-02-06 NOTE — Telephone Encounter (Signed)
Verified that Imaging downstairs started DEXAs at the end of last week. Placed order. Scheduled pt for 02/12/15 at 11am.  Left detailed message on pt's voicemail and to call radiology if appt needs to be rescheduled.

## 2015-02-12 ENCOUNTER — Inpatient Hospital Stay (HOSPITAL_BASED_OUTPATIENT_CLINIC_OR_DEPARTMENT_OTHER): Admission: RE | Admit: 2015-02-12 | Payer: PRIVATE HEALTH INSURANCE | Source: Ambulatory Visit

## 2015-02-12 ENCOUNTER — Ambulatory Visit (HOSPITAL_BASED_OUTPATIENT_CLINIC_OR_DEPARTMENT_OTHER)
Admission: RE | Admit: 2015-02-12 | Discharge: 2015-02-12 | Disposition: A | Discharge status: Home / Self Care | Payer: Medicare Other | Source: Ambulatory Visit | Attending: Family | Admitting: Family

## 2015-02-12 DIAGNOSIS — Z13820 Encounter for screening for osteoporosis: Secondary | ICD-10-CM | POA: Diagnosis not present

## 2015-02-12 DIAGNOSIS — E2839 Other primary ovarian failure: Secondary | ICD-10-CM | POA: Insufficient documentation

## 2015-02-14 ENCOUNTER — Telehealth: Payer: Self-pay | Admitting: *Deleted

## 2015-02-14 NOTE — Telephone Encounter (Signed)
Spoke with pt. She states she will call me back when she is able to talk as she is at the book store at present.

## 2015-02-14 NOTE — Telephone Encounter (Signed)
-----   Message from Rosalita Chessman, DO sent at 02/06/2015  9:35 PM EDT ----- Atrophic vaginitis with inflammation.  If no d/c--- none mentioned on PE.--- and if she is symptomatic she may benefit from premarin cream 1g vag daily x 1-2 weeks then decrease slowly to use least amount necessary for symptom relief. If she did have a d/c --- metrogel 1 app pv hs x 5 nights

## 2015-02-14 NOTE — Telephone Encounter (Signed)
Instead of estrogen, I would first recommend trial of triamcinolone cream to vulva bid prn. If no improvement, let me know.

## 2015-02-14 NOTE — Telephone Encounter (Signed)
Notified pt. She denies vaginal discharge. Reports that she is very dry and itchy and thought this was due to her psoriasis. Also reports that [name] thought she may have some yeast around the psoriasis and wants to know if we were going to send in diflucan Rx?  Also please advise re: below recommendation or premarin cream?

## 2015-02-16 IMAGING — MG MM DIGITAL SCREENING BILAT W/ CAD
4 series · 4 of 4 positions shown · non-contrast
Comparison: Previous exam(s)

ACR Breast Density Category a: The breast tissue is almost entirely
fatty.

CLINICAL DATA: Screening.

EXAM:
DIGITAL SCREENING BILATERAL MAMMOGRAM WITH CAD

[R CC]
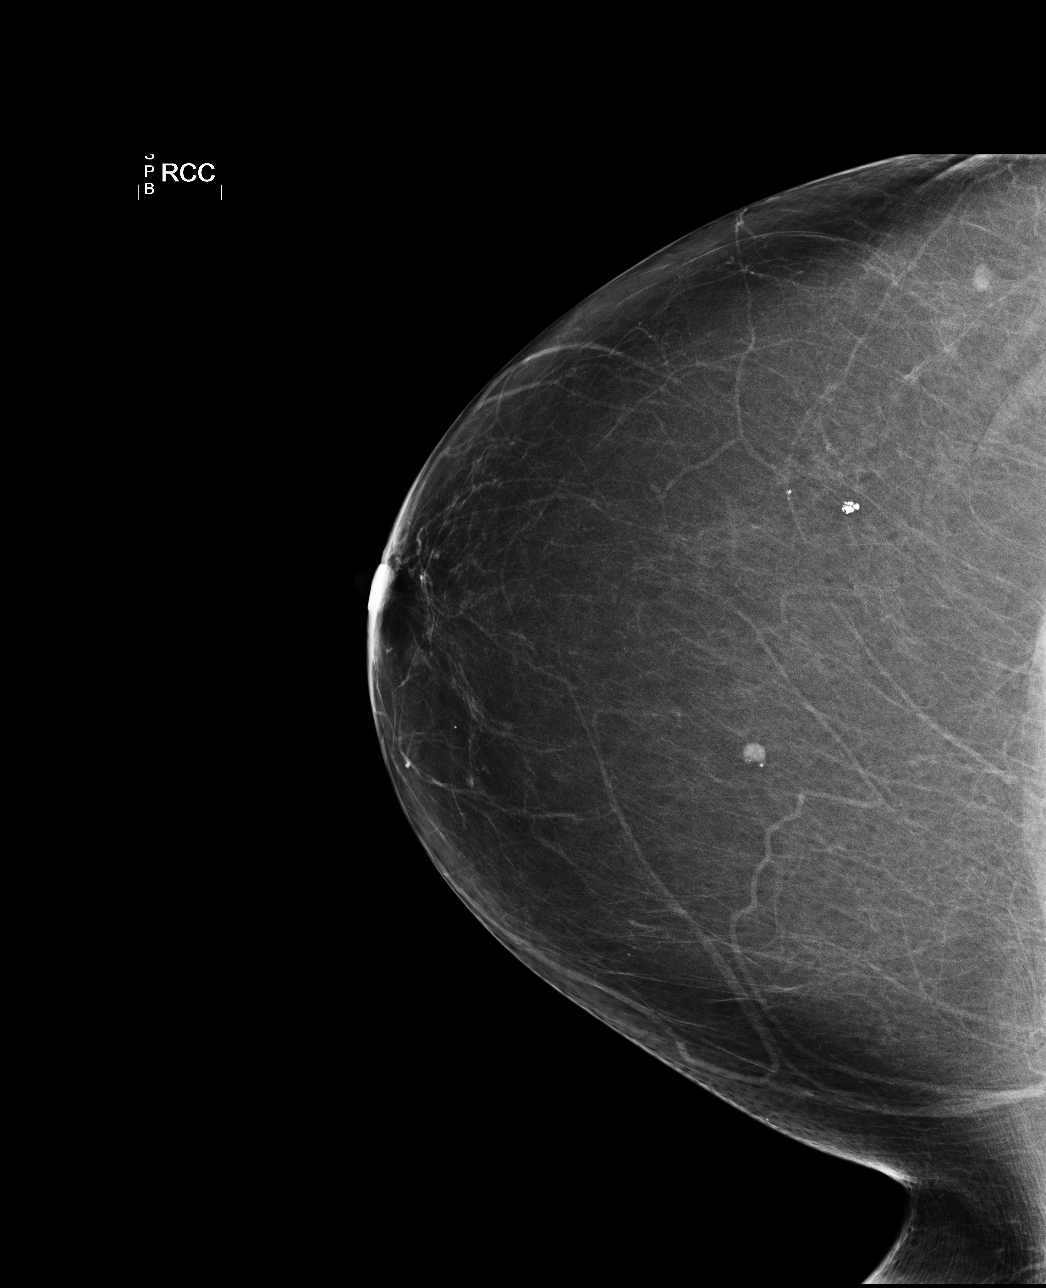

[L CC]
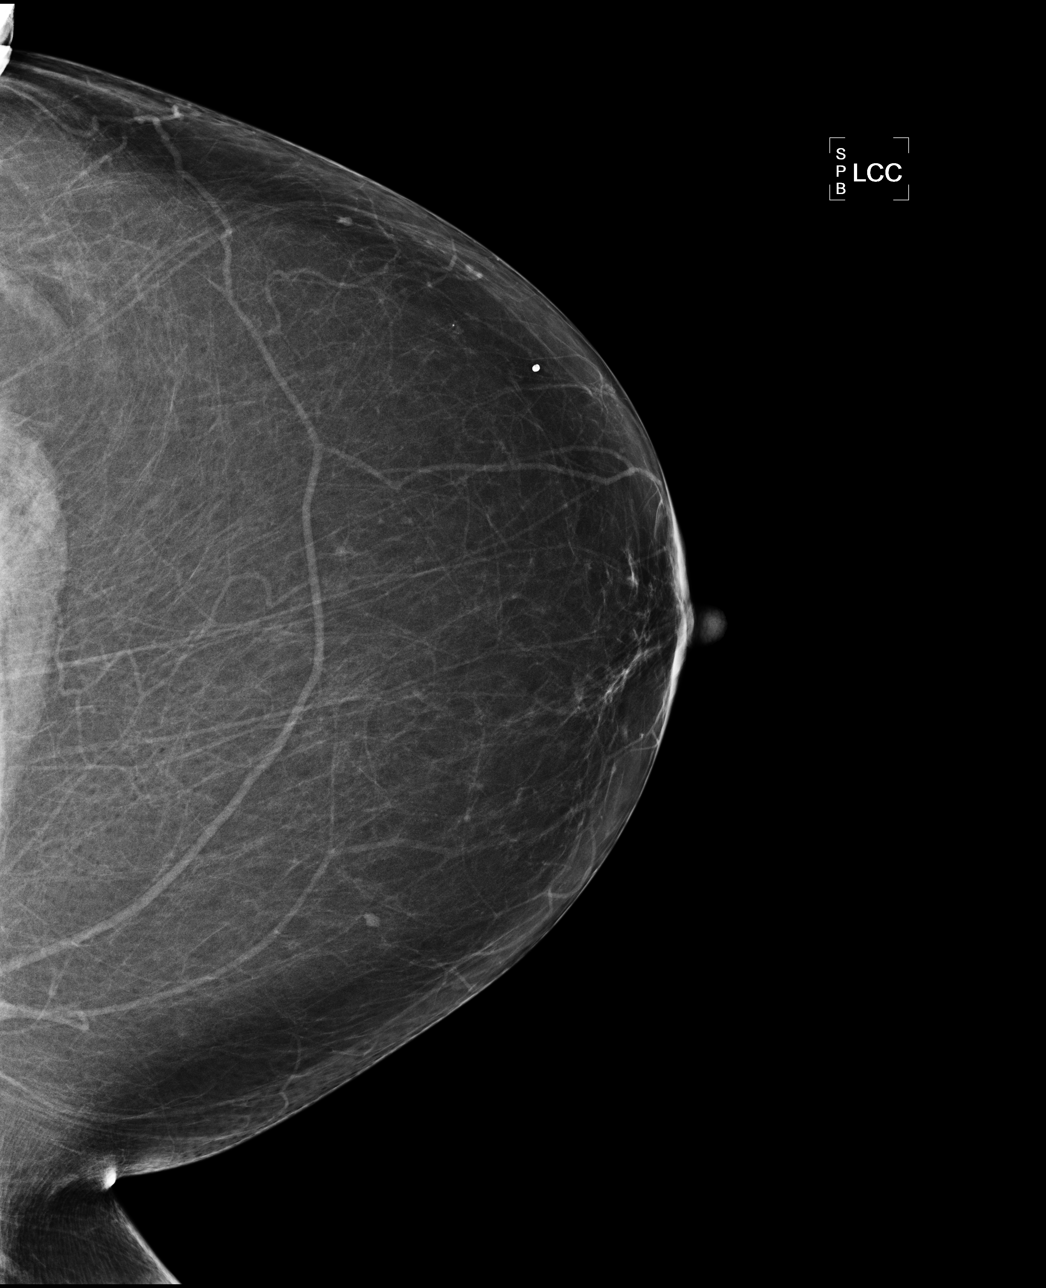

[L MLO]
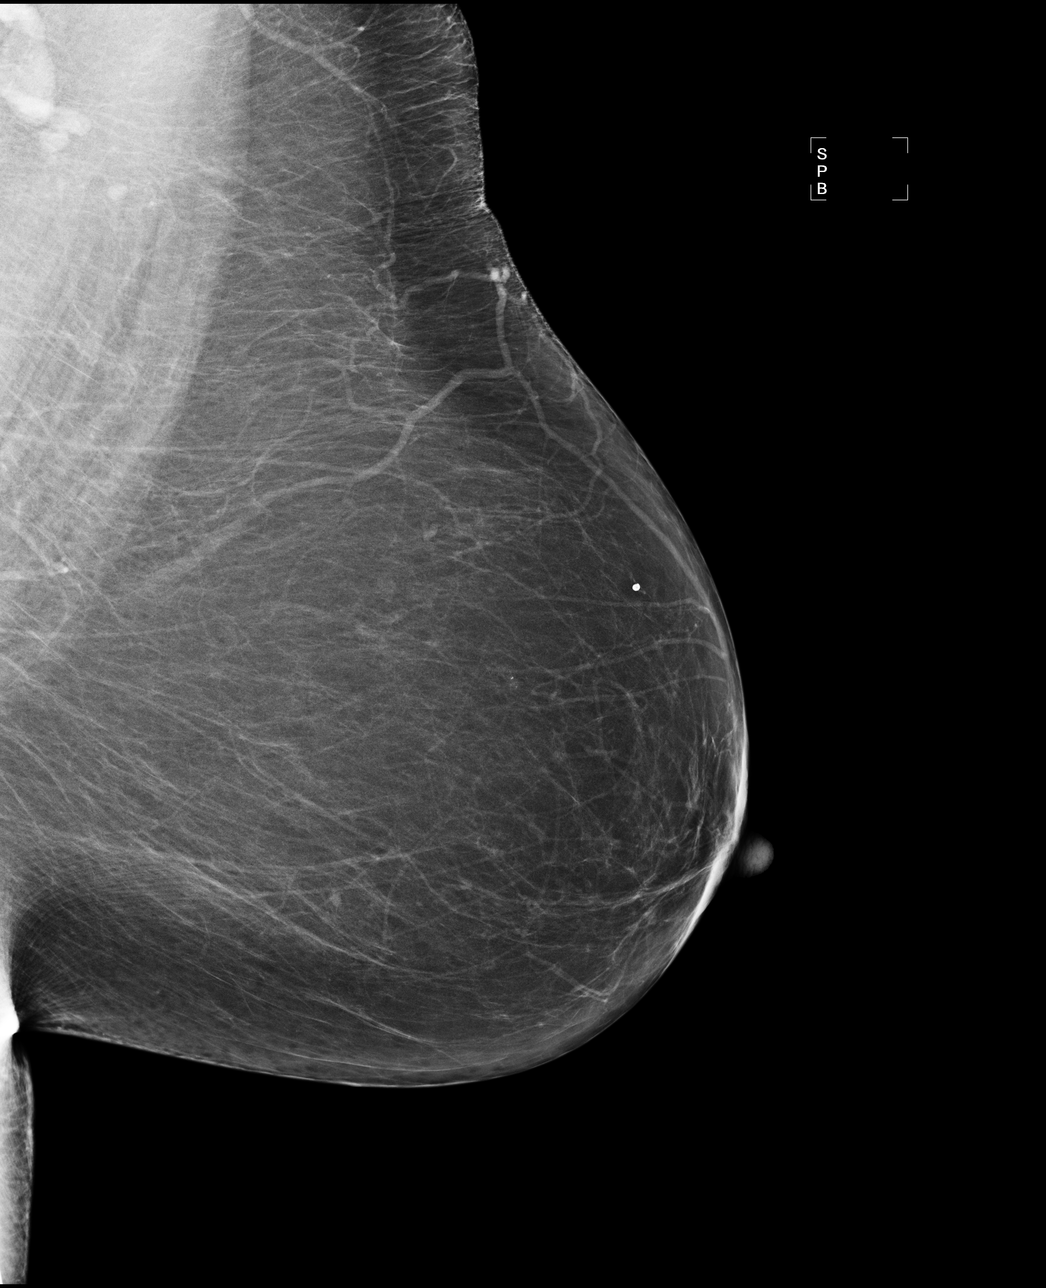

[R MLO]
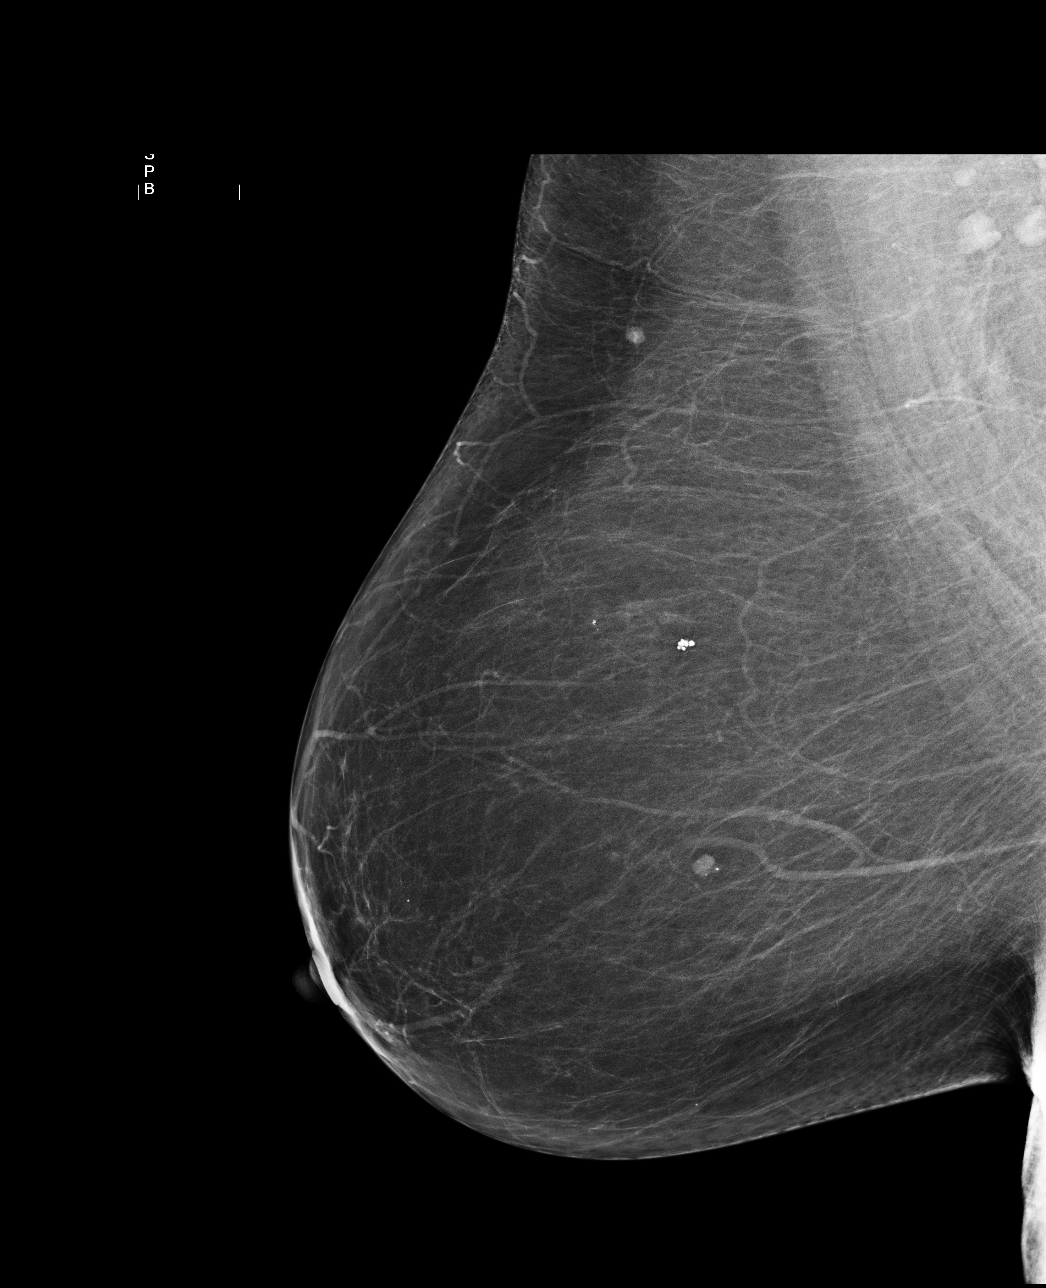

[4 of 4 positions shown; findings below may reference images not displayed]

FINDINGS: There are no findings suspicious for malignancy. Images were
processed with CAD.
IMPRESSION: No mammographic evidence of malignancy. A result letter of this
screening mammogram will be mailed directly to the patient.

RECOMMENDATION:
Screening mammogram in one year. (Code:JV-[id number])

BI-RADS CATEGORY  1: Negative.

## 2015-02-16 MED ORDER — TRIAMCINOLONE ACETONIDE 0.1 % EX CREA: 1.0000 "application " | TOPICAL_CREAM | Freq: Two times a day (BID) | CUTANEOUS | Status: DC

## 2015-02-16 NOTE — Telephone Encounter (Signed)
Spoke with patient and informed her of the doctor's recommendation to use the triamcinolone cream to the vulva bid prn. Also, addressed to call back if there are no changes or improvement with her condition. Patient understood instructions and did not have any further questions or concerns.

## 2015-02-18 ENCOUNTER — Encounter: Payer: Self-pay | Admitting: Family

## 2015-02-19 NOTE — Telephone Encounter (Signed)
Patient states that she is doing better since she has been using the triamcinolone cream.  Questions, is there anything else she should be doing about the internal that she cannot see?   Please advise.

## 2015-02-19 NOTE — Telephone Encounter (Signed)
Patient calling back. Would like to discuss this with Gilmore Laroche. Best # [phone]

## 2015-02-19 NOTE — Telephone Encounter (Signed)
No, but we can consider vaginal estrogen cream if symptoms worsen in the future.

## 2015-02-20 NOTE — Telephone Encounter (Signed)
Spoke with and advised per recommendations.  Patient agrees with plan.

## 2015-03-19 ENCOUNTER — Other Ambulatory Visit (INDEPENDENT_AMBULATORY_CARE_PROVIDER_SITE_OTHER): Payer: Medicare Other

## 2015-03-19 ENCOUNTER — Telehealth: Payer: Self-pay | Admitting: Family

## 2015-03-19 DIAGNOSIS — E538 Deficiency of other specified B group vitamins: Secondary | ICD-10-CM

## 2015-03-19 LAB — VITAMIN B12: Vitamin B-12: 229 pg/mL (ref 211–911)

## 2015-03-19 NOTE — Telephone Encounter (Signed)
Please let pt know that her b12 level is low normal.  Is she taking supplement?   If she is taking supplement regularly, then I would recommend that we start monthly b12 injections. If she is not taking oral b12 supplement regularly, then recommend resuming daily PO supplement. Either way, need to recheck b12 in 3 mos please, dx b12 deficiency.

## 2015-03-19 NOTE — Telephone Encounter (Signed)
Notified pt and she reports that she takes her B12 supplement on a regular basis. States she is agreeable to start B12 injections.  Does she need to do 1 a week for 4 weeks or just start doing 1 every month?  Also pt states she has had some diarrhea last week and wondered if that could have caused her B12 level to be low?  Please advise.

## 2015-03-19 NOTE — Telephone Encounter (Signed)
Notified pt and scheduled the 1st 4 b12 injections through 04/12/15.  Will need to schedule monthly b12 injections after that date. Scheduled lab appt for 06/21/15 (B12) and future order entered. Advised pt per verbal from PCP that diarrhea should not cause low B12 level.

## 2015-03-19 NOTE — Telephone Encounter (Signed)
Lets do b12 1058mcg IM once weekly for 4 weeks, then once monthly please.

## 2015-03-22 ENCOUNTER — Ambulatory Visit (INDEPENDENT_AMBULATORY_CARE_PROVIDER_SITE_OTHER): Payer: Medicare Other | Admitting: *Deleted

## 2015-03-22 DIAGNOSIS — E538 Deficiency of other specified B group vitamins: Secondary | ICD-10-CM

## 2015-03-22 MED ORDER — CYANOCOBALAMIN 1000 MCG/ML IJ SOLN
1000.0000 ug | Freq: Once | INTRAMUSCULAR | Status: AC
  Administered 2015-03-22: 1000 ug via INTRAMUSCULAR

## 2015-03-22 NOTE — Progress Notes (Signed)
Pre visit review using our clinic review tool, if applicable. No additional management support is needed unless otherwise documented below in the visit note.  Per telephone note:  [name], NP at 03/19/2015 5:12 PM     Status: Signed       Expand All Collapse All   Lets do b12 1065mcg IM once weekly for 4 weeks, then once monthly please.        Patient tolerated injection well.  Next injections previously scheduled.

## 2015-03-29 ENCOUNTER — Ambulatory Visit (INDEPENDENT_AMBULATORY_CARE_PROVIDER_SITE_OTHER): Payer: Medicare Other | Admitting: *Deleted

## 2015-03-29 DIAGNOSIS — E538 Deficiency of other specified B group vitamins: Secondary | ICD-10-CM

## 2015-03-29 MED ORDER — CYANOCOBALAMIN 1000 MCG/ML IJ SOLN
1000.0000 ug | Freq: Once | INTRAMUSCULAR | Status: AC
  Administered 2015-03-29: 1000 ug via INTRAMUSCULAR

## 2015-03-29 NOTE — Progress Notes (Signed)
Pre visit review using our clinic review tool, if applicable. No additional management support is needed unless otherwise documented below in the visit note.  Patient tolerated injection well.  Next injection previously scheduled.

## 2015-04-05 ENCOUNTER — Ambulatory Visit: Payer: Medicare Other

## 2015-04-05 ENCOUNTER — Other Ambulatory Visit: Payer: Self-pay | Admitting: Family

## 2015-04-05 DIAGNOSIS — E538 Deficiency of other specified B group vitamins: Secondary | ICD-10-CM

## 2015-04-12 ENCOUNTER — Ambulatory Visit (INDEPENDENT_AMBULATORY_CARE_PROVIDER_SITE_OTHER): Payer: Medicare Other | Admitting: *Deleted

## 2015-04-12 DIAGNOSIS — E538 Deficiency of other specified B group vitamins: Secondary | ICD-10-CM

## 2015-04-12 MED ORDER — CYANOCOBALAMIN 1000 MCG/ML IJ SOLN
1000.0000 ug | Freq: Once | INTRAMUSCULAR | Status: AC
  Administered 2015-04-12: 1000 ug via INTRAMUSCULAR

## 2015-04-12 NOTE — Progress Notes (Signed)
Pre visit review using our clinic review tool, if applicable. No additional management support is needed unless otherwise documented below in the visit note.  Patient tolerated injection well.  Next injection scheduled monthly.

## 2015-04-19 DIAGNOSIS — C189 Malignant neoplasm of colon, unspecified: Secondary | ICD-10-CM | POA: Diagnosis not present

## 2015-05-15 ENCOUNTER — Ambulatory Visit (INDEPENDENT_AMBULATORY_CARE_PROVIDER_SITE_OTHER): Payer: Medicare Other

## 2015-05-15 DIAGNOSIS — E538 Deficiency of other specified B group vitamins: Secondary | ICD-10-CM

## 2015-05-28 ENCOUNTER — Other Ambulatory Visit: Payer: Self-pay | Admitting: Family

## 2015-06-04 ENCOUNTER — Telehealth: Payer: Self-pay | Admitting: Family

## 2015-06-04 NOTE — Telephone Encounter (Signed)
Patient Name: Sheryl [name]  DOB: 23-Jul-1946    Initial Comment Caller states she has been having asthma issues for the last month, wheezing also has sinus congestion, whitish mucous, and scratchy throat, has appt tomorrow morning, xfer from office for triage   Nurse Assessment  Nurse: Verlin Fester, RN, [facility]ney Date/Time Eilene Ghazi Time): 06/04/2015 2:18:51 PM  Confirm and document reason for call. If symptomatic, describe symptoms. ---Patient states she has asthma issues, she is wheezing, has scratchy throat and sinus congestion.  Has the patient traveled out of the country within the last 30 days? ---No  Does the patient have any new or worsening symptoms? ---Yes  Will a triage be completed? ---Yes  Related visit to physician within the last 2 weeks? ---No  Does the PT have any chronic conditions? (i.e. diabetes, asthma, etc.) ---Yes  List chronic conditions. ---"thyroid, RA,     Guidelines    Guideline Title Affirmed Question Affirmed Notes  Asthma Attack [1] MILD asthma attack (e.g., no SOB at rest, mild SOB with walking, speaks normally in sentences, mild wheezing) AND [2] persists > 24 hours on appropriate treatment    Final Disposition User   See Physician within [place], RN, [facility]    Referrals  REFERRED TO PCP OFFICE   Disagree/Comply: Leta Baptist

## 2015-06-04 NOTE — Telephone Encounter (Signed)
Noted.  Message routed to provider for FYI.

## 2015-06-05 ENCOUNTER — Ambulatory Visit (INDEPENDENT_AMBULATORY_CARE_PROVIDER_SITE_OTHER): Payer: Medicare Other | Admitting: Family

## 2015-06-05 ENCOUNTER — Encounter: Payer: Self-pay | Admitting: Family

## 2015-06-05 VITALS — BP 132/82 | HR 83 | Temp 98.4°F | Resp 18 | Ht 62.75 in | Wt 195.8 lb

## 2015-06-05 DIAGNOSIS — J4541 Moderate persistent asthma with (acute) exacerbation: Secondary | ICD-10-CM

## 2015-06-05 DIAGNOSIS — E538 Deficiency of other specified B group vitamins: Secondary | ICD-10-CM

## 2015-06-05 MED ORDER — CYANOCOBALAMIN 1000 MCG/ML IJ SOLN
1000.0000 ug | Freq: Once | INTRAMUSCULAR | Status: AC
  Administered 2015-06-05: 1000 ug via INTRAMUSCULAR

## 2015-06-05 MED ORDER — PREDNISONE 10 MG PO TABS: ORAL_TABLET | ORAL | Status: DC

## 2015-06-05 NOTE — Progress Notes (Signed)
Subjective:    Patient ID: Sheryl [name], female    DOB: May 11, 1946, 69 y.o.   MRN: [id number]  HPI  Sheryl [name] is a 69 yr old female who presents today with chief complaint of dry cough x 1 month.  Thick milky mucous. Denies fever.  She continues the symbicort bid.  Using albuterol prn.    Due for b12 injection.   Review of Systems See HPI  Past Medical History  Diagnosis Date  . Hyperlipidemia   . Thyroid disease     hypothyroid  . Anemia     pernicious  . Asthma   . Diabetes mellitus     type 2  . Tobacco abuse   . Sjogren's syndrome ([place]) 03/17/2011  . Rheumatoid arthritis(714.0)   . Pericarditis 07/26/2012  . History of pericarditis 07/26/2012  . Small bowel obstruction ([place]) 03/2014    ?due to adhesions from colon surgery per pt  . Colon cancer ([place]) 2012  . Fatty liver 01/19/2015    Social History   Social History  . Marital Status: Single    Spouse Name: N/A  . Number of Children: 1  . Years of Education: N/A   Occupational History  . unemployed    Social History Main Topics  . Smoking status: Former Smoker    Types: Cigarettes  . Smokeless tobacco: Not on file     Comment: 3 cigarettes a month. Uses smokeless cigarette.  . Alcohol Use: No     Comment: Has not had alcohol since SBO 03/2014  . Drug Use: Yes     Comment: Smokes "medical grade" marijuana 2 puffs 3 x weekly for rheumatoid arthritis.per pt  . Sexual Activity: Not on file   Other Topics Concern  . Not on file   Social History Narrative   Regular exercise:  Stretching exercises. Resistance bands   Caffeine: 1 mug (2cus) daily.          Past Surgical History  Procedure Laterality Date  . Knee surgery  2006    arthroscopic left knee  . Knee surgery  2001    right knee  . Dilation and curettage of uterus  1975  . Colon surgery  8/.23/13    tumor removed from sigmoid colon.    Family History  Problem Relation Age of Onset  . Heart disease Other     CAD  . Diabetes  Other   . Hyperlipidemia Other   . Stroke Other     Allergies  Allergen Reactions  . Levofloxacin Shortness Of Breath and Rash  . Ativan [Lorazepam]     Visual hallucinations.   . Cefuroxime Axetil     REACTION: asthma, cough  . Rosuvastatin Calcium     Muscle pain, syncope  . Statins     Muscle pain, syncope    Current Outpatient Prescriptions on File Prior to Visit  Medication Sig Dispense Refill  . albuterol (VENTOLIN HFA) 108 (90 BASE) MCG/ACT inhaler Inhale 2 puffs into the lungs every 6 (six) hours as needed. 3 Inhaler 1  . aspirin 81 MG tablet Take 81 mg by mouth daily.      . budesonide-formoterol (SYMBICORT) 160-4.5 MCG/ACT inhaler Inhale 2 puffs into the lungs 2 (two) times daily. 3 Inhaler 1  . Cyanocobalamin (B-12) 1000 MCG CAPS Take 1,000 mcg by mouth daily.    . fish oil-omega-3 fatty acids 1000 MG capsule Take 2 capsules (2 g total) by mouth 2 (two) times daily.    Marland Kitchen  fluticasone (FLONASE) 50 MCG/ACT nasal spray Place 2 sprays into the nose daily as needed.    Marland Kitchen levothyroxine (SYNTHROID, LEVOTHROID) 100 MCG tablet Take 1 tablet (100 mcg total) by mouth daily. 90 tablet 1  . montelukast (SINGULAIR) 10 MG tablet TAKE 1 TABLET AT BEDTIME 90 tablet 1  . multivitamin (THERAGRAN) per tablet Take 1 tablet by mouth daily.      . NON FORMULARY TUMERIC MILK.    Marland Kitchen nystatin (MYCOSTATIN) 100000 UNIT/ML suspension TAKE 5 MLS BY MOUTH 4 TIMES DAILY. 180 mL 0  . ondansetron (ZOFRAN-ODT) 8 MG disintegrating tablet Take 1 tablet (8 mg total) by mouth every 8 (eight) hours as needed for nausea. 20 tablet 3  . pantoprazole (PROTONIX) 40 MG tablet Take 1 tablet (40 mg total) by mouth daily. 90 tablet 1  . triamcinolone cream (KENALOG) 0.1 % Apply 1 application topically 2 (two) times daily. 30 g 0  . zolpidem (AMBIEN) 5 MG tablet Take 1 tablet (5 mg total) by mouth at bedtime as needed for sleep. (Patient not taking: Reported on 06/05/2015) 90 tablet 1   Current Facility-Administered  Medications on File Prior to Visit  Medication Dose Route Frequency Provider Last Rate Last Dose  . ipratropium-albuterol (DUONEB) 0.5-2.5 (3) MG/3ML nebulizer solution 3 mL  3 mL Nebulization Q4H Brunetta Jeans, PA-C   3 mL at 09/01/14 1651    BP 132/82 mmHg  Pulse 83  Temp(Src) 98.4 F (36.9 C) (Oral)  Resp 18  Ht 5' 2.75" (1.594 m)  Wt 195 lb 12.8 oz (88.814 kg)  BMI 34.95 kg/m2  SpO2 97%       Objective:   Physical Exam  Constitutional: She is oriented to person, place, and time. She appears well-developed and well-nourished. No distress.  HENT:  Head: Normocephalic and atraumatic.  Right Ear: Tympanic membrane and ear canal normal.  Left Ear: Tympanic membrane and ear canal normal.  Mouth/Throat: No oropharyngeal exudate, posterior oropharyngeal edema or posterior oropharyngeal erythema.  Cardiovascular: Normal rate and regular rhythm.   No murmur heard. Pulmonary/Chest: Effort normal. No respiratory distress. She has wheezes in the right lower field.  Musculoskeletal: She exhibits no edema.  Neurological: She is alert and oriented to person, place, and time.  Skin: Skin is warm and dry.  Psychiatric: She has a normal mood and affect. Her behavior is normal. Judgment and thought content normal.          Assessment & Plan:  69 yr old female with acute asthma exacerbation. Recommend prednisone taper.  Continue symbicort, albuterol prn. Advised pt to call if symptoms worsen or do not improve.  No obvious infection at this time, however if symptoms do not improve could consider addition of abx.

## 2015-06-05 NOTE — Patient Instructions (Signed)
Start prednisone taper. Continue albuterol and symbicort. Call if symptoms worsen, or if not improved in 3 days.

## 2015-06-05 NOTE — Progress Notes (Signed)
Pre visit review using our clinic review tool, if applicable. No additional management support is needed unless otherwise documented below in the visit note. 

## 2015-06-05 NOTE — Addendum Note (Signed)
Addended by: Kelle Darting A on: 06/05/2015 04:11 PM   Modules accepted: Medications

## 2015-06-18 ENCOUNTER — Telehealth: Payer: Self-pay | Admitting: Family

## 2015-06-18 MED ORDER — AZITHROMYCIN 250 MG PO TABS: ORAL_TABLET | ORAL | Status: DC

## 2015-06-18 NOTE — Telephone Encounter (Signed)
Patient calling checking on the status of message below best [phone]

## 2015-06-18 NOTE — Telephone Encounter (Signed)
Notified pt and she voices understanding. 

## 2015-06-18 NOTE — Telephone Encounter (Signed)
Pt said that she is still having issues with asthma flare up and now has yellow mucus. She is wondering if now she needs an antibiotic sent in. Please notify pt. Best # [phone].  Pharmacy: [facility]

## 2015-06-18 NOTE — Telephone Encounter (Signed)
rx sent for zpak. Advise pt that if symptoms are not improved in 1-2 days, if fever, or if worsening wheezing, needs to be seen back in office.

## 2015-06-21 ENCOUNTER — Other Ambulatory Visit (INDEPENDENT_AMBULATORY_CARE_PROVIDER_SITE_OTHER): Payer: Medicare Other

## 2015-06-21 DIAGNOSIS — E538 Deficiency of other specified B group vitamins: Secondary | ICD-10-CM | POA: Diagnosis not present

## 2015-06-21 LAB — VITAMIN B12: Vitamin B-12: 509 pg/mL (ref 211–911)

## 2015-06-22 ENCOUNTER — Encounter: Payer: Self-pay | Admitting: Family

## 2015-06-29 ENCOUNTER — Ambulatory Visit (INDEPENDENT_AMBULATORY_CARE_PROVIDER_SITE_OTHER): Payer: Medicare Other | Admitting: Behavioral Health

## 2015-06-29 ENCOUNTER — Telehealth: Payer: Self-pay | Admitting: Family

## 2015-06-29 DIAGNOSIS — Z23 Encounter for immunization: Secondary | ICD-10-CM

## 2015-06-29 NOTE — Telephone Encounter (Signed)
Spoke with the representative and the patient is not in the system yet. I will try back shortly. I spoke with the patient and she said she just got off of the phone with them right before she called.    KP

## 2015-06-29 NOTE — Progress Notes (Signed)
Pre visit review using our clinic review tool, if applicable. No additional management support is needed unless otherwise documented below in the visit note. 

## 2015-06-29 NOTE — Telephone Encounter (Signed)
Spoke with pt. Advised her I will print application and Rx form and we will leave at front desk for her to pick up and mail to [facility] along with her financial documentation. Pt states she is coming in at 3pm for flu shot and will pick up forms at that time.

## 2015-06-29 NOTE — Telephone Encounter (Signed)
Fax # [phone] to [place] [facility] (program thru [employer]) to get free Symbicort for a month or 2. She said you have to fax it in with her name, address, phone #, and DOB.

## 2015-08-03 ENCOUNTER — Ambulatory Visit (INDEPENDENT_AMBULATORY_CARE_PROVIDER_SITE_OTHER): Payer: Medicare Other | Admitting: Family

## 2015-08-03 ENCOUNTER — Encounter: Payer: Self-pay | Admitting: Family

## 2015-08-03 VITALS — BP 147/73 | HR 86 | Temp 98.3°F | Resp 18 | Ht 62.75 in | Wt 199.0 lb

## 2015-08-03 DIAGNOSIS — E039 Hypothyroidism, unspecified: Secondary | ICD-10-CM

## 2015-08-03 DIAGNOSIS — E119 Type 2 diabetes mellitus without complications: Secondary | ICD-10-CM

## 2015-08-03 DIAGNOSIS — M722 Plantar fascial fibromatosis: Secondary | ICD-10-CM

## 2015-08-03 DIAGNOSIS — M069 Rheumatoid arthritis, unspecified: Secondary | ICD-10-CM

## 2015-08-03 DIAGNOSIS — J45909 Unspecified asthma, uncomplicated: Secondary | ICD-10-CM

## 2015-08-03 DIAGNOSIS — D510 Vitamin B12 deficiency anemia due to intrinsic factor deficiency: Secondary | ICD-10-CM | POA: Diagnosis not present

## 2015-08-03 LAB — BASIC METABOLIC PANEL
BUN: 14 mg/dL (ref 6–23)
CHLORIDE: 102 meq/L (ref 96–112)
CO2: 29 mEq/L (ref 19–32)
CREATININE: 0.8 mg/dL (ref 0.40–1.20)
Calcium: 8.9 mg/dL (ref 8.4–10.5)
GFR: 75.78 mL/min (ref >60.00)
Glucose, Bld: 106 mg/dL — ABNORMAL HIGH (ref 70–99)
Potassium: 4.6 mEq/L (ref 3.5–5.1)
SODIUM: 136 meq/L (ref 135–145)

## 2015-08-03 LAB — VITAMIN B12: Vitamin B-12: >1500 pg/mL — ABNORMAL HIGH (ref 211–911)

## 2015-08-03 LAB — CBC WITH DIFFERENTIAL/PLATELET
BASOS ABS: 0 10*3/uL (ref 0.0–0.1)
BASOS PCT: 0.6 % (ref 0.0–3.0)
Eosinophils Absolute: 0.2 10*3/uL (ref 0.0–0.7)
Eosinophils Relative: 3.1 % (ref 0.0–5.0)
HCT: 46.1 % — ABNORMAL HIGH (ref 36.0–46.0)
Hemoglobin: 15.1 g/dL — ABNORMAL HIGH (ref 12.0–15.0)
Lymphocytes Relative: 29.7 % (ref 12.0–46.0)
Lymphs Abs: 1.9 10*3/uL (ref 0.7–4.0)
MCHC: 32.8 g/dL (ref 30.0–36.0)
MCV: 91.6 fl (ref 78.0–100.0)
MONOS PCT: 5.9 % (ref 3.0–12.0)
Monocytes Absolute: 0.4 10*3/uL (ref 0.1–1.0)
Neutro Abs: 3.8 10*3/uL (ref 1.4–7.7)
Neutrophils Relative %: 60.7 % (ref 43.0–77.0)
PLATELETS: 171 10*3/uL (ref 150.0–400.0)
RBC: 5.04 Mil/uL (ref 3.87–5.11)
RDW: 14.6 % (ref 11.5–15.5)
WBC: 6.2 10*3/uL (ref 4.0–10.5)

## 2015-08-03 LAB — HEMOGLOBIN A1C: HEMOGLOBIN A1C: 6.2 % (ref 4.6–6.5)

## 2015-08-03 LAB — MICROALBUMIN / CREATININE URINE RATIO
Creatinine,U: 112.1 mg/dL
MICROALB/CREAT RATIO: 4.9 mg/g (ref 0.0–30.0)
Microalb, Ur: 5.5 mg/dL — ABNORMAL HIGH (ref 0.0–1.9)

## 2015-08-03 LAB — TSH: TSH: 1.44 u[IU]/mL (ref 0.35–4.50)

## 2015-08-03 MED ORDER — CYANOCOBALAMIN 1000 MCG/ML IJ SOLN
1000.0000 ug | INTRAMUSCULAR | Status: AC
  Administered 2015-08-03: 1000 ug via INTRAMUSCULAR

## 2015-08-03 MED ORDER — DICLOFENAC SODIUM 1 % TD GEL: 2.0000 g | Freq: Four times a day (QID) | TRANSDERMAL | Status: DC | PRN

## 2015-08-03 MED ORDER — PREDNISONE 10 MG PO TABS: ORAL_TABLET | ORAL | Status: DC

## 2015-08-03 NOTE — Assessment & Plan Note (Signed)
Fair control, declines referral to rheumatology.

## 2015-08-03 NOTE — Progress Notes (Signed)
Pre visit review using our clinic review tool, if applicable. No additional management support is needed unless otherwise documented below in the visit note. 

## 2015-08-03 NOTE — Assessment & Plan Note (Addendum)
Maintained on singulair and symbicort. I did give her rx for prednisone to have on hand in case of asthma exacerbation while out of town.

## 2015-08-03 NOTE — Patient Instructions (Signed)
Please complete lab work prior to leaving.   

## 2015-08-03 NOTE — Assessment & Plan Note (Signed)
b12 injection today, obtain b12 level and CBC.

## 2015-08-03 NOTE — Assessment & Plan Note (Signed)
Obtain A1C. 

## 2015-08-03 NOTE — Assessment & Plan Note (Signed)
Clinically stable on synthroid, obtain TSH.  

## 2015-08-03 NOTE — Progress Notes (Signed)
Subjective:    Patient ID: Sheryl [name], female    DOB: 1945/09/11, 70 y.o.   MRN: [id number]  HPI  Sheryl [name] is a 69 yr old female who presents today for follow up.  1) DM2- reports non-compliance with diet Lab Results  Component Value Date   HGBA1C 6.1 01/08/2015   HGBA1C 6.5 09/19/2014   HGBA1C 6.1* 03/10/2014   Lab Results  Component Value Date   MICROALBUR 0.5 09/19/2014   LDLCALC 136* 01/08/2015   CREATININE 0.81 02/05/2015   2) Hypothyroid-  She continues synthroid.  Reports good compliance with synthroid. Lab Results  Component Value Date   TSH 1.33 01/31/2015   3) Pernicious anemia-  She continues b12 injections Lab Results  Component Value Date   WBC 4.8 01/08/2015   HGB 14.7 01/08/2015   HCT 43.5 01/08/2015   MCV 90.1 01/08/2015   PLT 155.0 01/08/2015   4) Asthma- maintained on singulair and symbicort. Uses albuterol < 1x a week.   5) Bilateral foot pain- noted in both arches, worse at night present x 6 months. Notes that she often wears unsupportive shoes.    6) RA- She declines referral to rheumatology  Wt Readings from Last 3 Encounters:  08/03/15 199 lb (90.266 kg)  06/05/15 195 lb 12.8 oz (88.814 kg)  01/31/15 192 lb (87.091 kg)    Review of Systems See HPI  Past Medical History  Diagnosis Date  . Hyperlipidemia   . Thyroid disease     hypothyroid  . Anemia     pernicious  . Asthma   . Diabetes mellitus     type 2  . Tobacco abuse   . Sjogren's syndrome ([place]) 03/17/2011  . Rheumatoid arthritis(714.0)   . Pericarditis 07/26/2012  . History of pericarditis 07/26/2012  . Small bowel obstruction ([place]) 03/2014    ?due to adhesions from colon surgery per pt  . Colon cancer ([place]) 2012  . Fatty liver 01/19/2015    Social History   Social History  . Marital Status: Single    Spouse Name: N/A  . Number of Children: 1  . Years of Education: N/A   Occupational History  . unemployed    Social History Main Topics  .  Smoking status: Former Smoker    Types: Cigarettes  . Smokeless tobacco: Not on file     Comment: 3 cigarettes a month. Uses smokeless cigarette.  . Alcohol Use: No     Comment: Has not had alcohol since SBO 03/2014  . Drug Use: Yes     Comment: Smokes "medical grade" marijuana 2 puffs 3 x weekly for rheumatoid arthritis.per pt  . Sexual Activity: Not on file   Other Topics Concern  . Not on file   Social History Narrative   Regular exercise:  Stretching exercises. Resistance bands   Caffeine: 1 mug (2cus) daily.          Past Surgical History  Procedure Laterality Date  . Knee surgery  2006    arthroscopic left knee  . Knee surgery  2001    right knee  . Dilation and curettage of uterus  1975  . Colon surgery  8/.23/13    tumor removed from sigmoid colon.    Family History  Problem Relation Age of Onset  . Heart disease Other     CAD  . Diabetes Other   . Hyperlipidemia Other   . Stroke Other     Allergies  Allergen Reactions  . Levofloxacin  Shortness Of Breath and Rash  . Ativan [Lorazepam]     Visual hallucinations.   . Cefuroxime Axetil     REACTION: asthma, cough  . Rosuvastatin Calcium     Muscle pain, syncope  . Statins     Muscle pain, syncope    Current Outpatient Prescriptions on File Prior to Visit  Medication Sig Dispense Refill  . albuterol (VENTOLIN HFA) 108 (90 BASE) MCG/ACT inhaler Inhale 2 puffs into the lungs every 6 (six) hours as needed. 3 Inhaler 1  . aspirin 81 MG tablet Take 81 mg by mouth daily.      . budesonide-formoterol (SYMBICORT) 160-4.5 MCG/ACT inhaler Inhale 2 puffs into the lungs 2 (two) times daily. 3 Inhaler 1  . Cyanocobalamin (B-12) 1000 MCG CAPS Take 1,000 mcg by mouth daily.    . fish oil-omega-3 fatty acids 1000 MG capsule Take 2 capsules (2 g total) by mouth 2 (two) times daily.    . fluticasone (FLONASE) 50 MCG/ACT nasal spray Place 2 sprays into the nose daily as needed.    Marland Kitchen levothyroxine (SYNTHROID, LEVOTHROID)  100 MCG tablet Take 1 tablet (100 mcg total) by mouth daily. 90 tablet 1  . montelukast (SINGULAIR) 10 MG tablet TAKE 1 TABLET AT BEDTIME 90 tablet 1  . multivitamin (THERAGRAN) per tablet Take 1 tablet by mouth daily.      . NON FORMULARY TUMERIC MILK.    Marland Kitchen nystatin (MYCOSTATIN) 100000 UNIT/ML suspension TAKE 5 MLS BY MOUTH 4 TIMES DAILY. 180 mL 0  . ondansetron (ZOFRAN-ODT) 8 MG disintegrating tablet Take 1 tablet (8 mg total) by mouth every 8 (eight) hours as needed for nausea. 20 tablet 3  . pantoprazole (PROTONIX) 40 MG tablet Take 1 tablet (40 mg total) by mouth daily. 90 tablet 1  . triamcinolone cream (KENALOG) 0.1 % Apply 1 application topically 2 (two) times daily. 30 g 0  . zolpidem (AMBIEN) 5 MG tablet Take 1 tablet (5 mg total) by mouth at bedtime as needed for sleep. 90 tablet 1   Current Facility-Administered Medications on File Prior to Visit  Medication Dose Route Frequency Provider Last Rate Last Dose  . ipratropium-albuterol (DUONEB) 0.5-2.5 (3) MG/3ML nebulizer solution 3 mL  3 mL Nebulization Q4H Brunetta Jeans, PA-C   3 mL at 09/01/14 1651    BP 147/73 mmHg  Pulse 86  Temp(Src) 98.3 F (36.8 C) (Oral)  Resp 18  Ht 5' 2.75" (1.594 m)  Wt 199 lb (90.266 kg)  BMI 35.53 kg/m2  SpO2 99%       Objective:   Physical Exam  Constitutional: She is oriented to person, place, and time. She appears well-developed and well-nourished.  Overweight white female  Eyes: No scleral icterus.  Cardiovascular: Normal rate, regular rhythm and normal heart sounds.   No murmur heard. Pulmonary/Chest: Effort normal and breath sounds normal. No respiratory distress. She has no wheezes.  Musculoskeletal: She exhibits no edema.  Neurological: She is alert and oriented to person, place, and time.  Skin: Skin is warm and dry.  Psychiatric: She has a normal mood and affect. Her behavior is normal. Judgment and thought content normal.          Assessment & Plan:

## 2015-08-03 NOTE — Assessment & Plan Note (Signed)
Discussed importance of supportive shoes. Discussed icing and short course of aleve.

## 2015-08-07 ENCOUNTER — Encounter: Payer: Self-pay | Admitting: Family

## 2015-08-30 DIAGNOSIS — C186 Malignant neoplasm of descending colon: Secondary | ICD-10-CM | POA: Diagnosis not present

## 2015-09-03 ENCOUNTER — Telehealth: Payer: Self-pay | Admitting: Family

## 2015-09-03 NOTE — Telephone Encounter (Signed)
She really needs OV. We need to determine if she has pneumonia and if her oxygen saturation is OK.  She should wrap up in a scarf if she goes outside.

## 2015-09-03 NOTE — Telephone Encounter (Signed)
Notified pt and she states her asthma has been flaring up since the temperatures have gotten colder. Pt states she usually wraps a scarf around her face and tried that this time and still took her breath away. Pt states she is going to start taking mucinex twice a day, has started taking prednisone 10mg  twice a day from a previous prescription. Pt requests an appt around the end of the week in the afternoon but did not want to wait until Friday. Schedule pt appt with PA, Hassell Done at 1:15pm on Wednesday.

## 2015-09-03 NOTE — Telephone Encounter (Signed)
Caller name:Leonela Relationship to patient:self Can be [phone] Pharmacy:[facility]   Reason for call:She has a bad cough and short of breath tight in her chest and is coughing up mucus  The cold air makes it so she cant breath  She would like you to call her in something prednisone antibiotic and and expectorant

## 2015-09-05 ENCOUNTER — Ambulatory Visit (INDEPENDENT_AMBULATORY_CARE_PROVIDER_SITE_OTHER): Payer: Medicare Other | Admitting: Physician Assistant

## 2015-09-05 ENCOUNTER — Encounter: Payer: Self-pay | Admitting: Physician Assistant

## 2015-09-05 VITALS — BP 122/84 | HR 98 | Temp 97.9°F | Ht 62.0 in | Wt 200.1 lb

## 2015-09-05 DIAGNOSIS — J45901 Unspecified asthma with (acute) exacerbation: Secondary | ICD-10-CM | POA: Diagnosis not present

## 2015-09-05 MED ORDER — IPRATROPIUM-ALBUTEROL 0.5-2.5 (3) MG/3ML IN SOLN
3.0000 mL | Freq: Once | RESPIRATORY_TRACT | Status: AC
  Administered 2015-09-05: 3 mL via RESPIRATORY_TRACT

## 2015-09-05 MED ORDER — HYDROCODONE-HOMATROPINE 5-1.5 MG/5ML PO SYRP: 5.0000 mL | ORAL_SOLUTION | Freq: Three times a day (TID) | ORAL | Status: DC | PRN

## 2015-09-05 MED ORDER — PREDNISONE 10 MG PO TABS: ORAL_TABLET | ORAL | Status: DC

## 2015-09-05 MED ORDER — AZITHROMYCIN 250 MG PO TABS: ORAL_TABLET | ORAL | Status: DC

## 2015-09-05 MED FILL — HYDROCODONE-HOMATROPINE SYR: 5-1.5 | 8 days supply | Qty: 120 | Fill #0

## 2015-09-05 MED FILL — AZITHROMYCIN 250 MG TABLET: 250 | 5 days supply | Qty: 6 | Fill #0

## 2015-09-05 MED FILL — predniSONE 10 MG TABS: 10 | 12 days supply | Qty: 30 | Fill #0

## 2015-09-05 NOTE — Patient Instructions (Addendum)
Please start antibiotic and steroid taper taking as directed. Stay well hydrated and rest when possible. Continue your inhalers as directed.  Use the Hycodan as directed for cough.  Follow-up with [name] in 1 week. Return immediately if anything acutely worsens.

## 2015-09-05 NOTE — Progress Notes (Signed)
Patient presents to clinic today c/o 4-5 days of chest tightness, wheezing, productive cough and fatigue. Denies fever, chills, chest pain, recent travel or sick contact. Patient with history of asthma -- has been taking Symbicort as directed. Has required Albuterol every 4-6 hours over the past few days. Is a prior diabetic with last A1C in December at 6.2.  Past Medical History  Diagnosis Date  . Hyperlipidemia   . Thyroid disease     hypothyroid  . Anemia     pernicious  . Asthma   . Diabetes mellitus     type 2  . Tobacco abuse   . Sjogren's syndrome ([place]) 03/17/2011  . Rheumatoid arthritis(714.0)   . Pericarditis 07/26/2012  . History of pericarditis 07/26/2012  . Small bowel obstruction ([place]) 03/2014    ?due to adhesions from colon surgery per pt  . Colon cancer ([place]) 2012  . Fatty liver 01/19/2015    Current Outpatient Prescriptions on File Prior to Visit  Medication Sig Dispense Refill  . albuterol (VENTOLIN HFA) 108 (90 BASE) MCG/ACT inhaler Inhale 2 puffs into the lungs every 6 (six) hours as needed. 3 Inhaler 1  . aspirin 81 MG tablet Take 81 mg by mouth daily.      . budesonide-formoterol (SYMBICORT) 160-4.5 MCG/ACT inhaler Inhale 2 puffs into the lungs 2 (two) times daily. 3 Inhaler 1  . Cyanocobalamin (B-12) 1000 MCG CAPS Take 1,000 mcg by mouth daily.    . diclofenac sodium (VOLTAREN) 1 % GEL Apply 2 g topically 4 (four) times daily as needed. 100 g 2  . fish oil-omega-3 fatty acids 1000 MG capsule Take 2 capsules (2 g total) by mouth 2 (two) times daily.    . fluticasone (FLONASE) 50 MCG/ACT nasal spray Place 2 sprays into the nose daily as needed.    Marland Kitchen levothyroxine (SYNTHROID, LEVOTHROID) 100 MCG tablet Take 1 tablet (100 mcg total) by mouth daily. 90 tablet 1  . montelukast (SINGULAIR) 10 MG tablet TAKE 1 TABLET AT BEDTIME 90 tablet 1  . multivitamin (THERAGRAN) per tablet Take 1 tablet by mouth daily.      . NON FORMULARY TUMERIC MILK.    Marland Kitchen nystatin (MYCOSTATIN)  100000 UNIT/ML suspension TAKE 5 MLS BY MOUTH 4 TIMES DAILY. 180 mL 0  . ondansetron (ZOFRAN-ODT) 8 MG disintegrating tablet Take 1 tablet (8 mg total) by mouth every 8 (eight) hours as needed for nausea. 20 tablet 3  . pantoprazole (PROTONIX) 40 MG tablet Take 1 tablet (40 mg total) by mouth daily. 90 tablet 1  . triamcinolone cream (KENALOG) 0.1 % Apply 1 application topically 2 (two) times daily. 30 g 0  . zolpidem (AMBIEN) 5 MG tablet Take 1 tablet (5 mg total) by mouth at bedtime as needed for sleep. 90 tablet 1   Current Facility-Administered Medications on File Prior to Visit  Medication Dose Route Frequency Provider Last Rate Last Dose  . cyanocobalamin ((VITAMIN B-12)) injection 1,000 mcg  1,000 mcg Intramuscular Q30 days [name], NP   1,000 mcg at 08/03/15 1148  . ipratropium-albuterol (DUONEB) 0.5-2.5 (3) MG/3ML nebulizer solution 3 mL  3 mL Nebulization Q4H Brunetta Jeans, PA-C   3 mL at 09/01/14 1651    Allergies  Allergen Reactions  . Levofloxacin Shortness Of Breath and Rash  . Ativan [Lorazepam]     Visual hallucinations.   . Cefuroxime Axetil     REACTION: asthma, cough  . Rosuvastatin Calcium     Muscle pain, syncope  .  Statins     Muscle pain, syncope    Family History  Problem Relation Age of Onset  . Heart disease Other     CAD  . Diabetes Other   . Hyperlipidemia Other   . Stroke Other     Social History   Social History  . Marital Status: Single    Spouse Name: N/A  . Number of Children: 1  . Years of Education: N/A   Occupational History  . unemployed    Social History Main Topics  . Smoking status: Former Smoker    Types: Cigarettes  . Smokeless tobacco: None     Comment: 3 cigarettes a month. Uses smokeless cigarette.  . Alcohol Use: No     Comment: Has not had alcohol since SBO 03/2014  . Drug Use: Yes     Comment: Smokes "medical grade" marijuana 2 puffs 3 x weekly for rheumatoid arthritis.per pt  . Sexual Activity: Not  Asked   Other Topics Concern  . None   Social History Narrative   Regular exercise:  Stretching exercises. Resistance bands   Caffeine: 1 mug (2cus) daily.         Review of Systems - See HPI.  All other ROS are negative.  BP 122/84 mmHg  Pulse 98  Temp(Src) 97.9 F (36.6 C) (Oral)  Ht 5\' 2"  (1.575 m)  Wt 200 lb 2 oz (90.776 kg)  BMI 36.59 kg/m2  SpO2 96%  Physical Exam  Constitutional: She is oriented to person, place, and time and well-developed, well-nourished, and in no distress.  HENT:  Head: Normocephalic and atraumatic.  Right Ear: External ear normal.  Nose: Nose normal.  Mouth/Throat: Oropharynx is clear and moist. No oropharyngeal exudate.  Eyes: Conjunctivae are normal.  Neck: Neck supple.  Cardiovascular: Normal rate, regular rhythm, normal heart sounds and intact distal pulses.   Pulmonary/Chest: Effort normal. No respiratory distress. She has wheezes. She has no rales. She exhibits no tenderness.  Neurological: She is alert and oriented to person, place, and time.  Skin: Skin is warm and dry. No rash noted.  Psychiatric: Affect normal.  Vitals reviewed.  Recent Results (from the past 2160 hour(s))  Vitamin B12     Status: None   Collection Time: 06/21/15 10:54 AM  Result Value Ref Range   Vitamin B-12 509 211 - 911 pg/mL  Hemoglobin A1c     Status: None   Collection Time: 08/03/15 11:48 AM  Result Value Ref Range   Hgb A1c MFr Bld 6.2 4.6 - 6.5 %    Comment: Glycemic Control Guidelines for People with Diabetes:Non Diabetic:  <6%Goal of Therapy: <7%Additional Action Suggested:  >8%   TSH     Status: None   Collection Time: 08/03/15 11:48 AM  Result Value Ref Range   TSH 1.44 0.35 - 4.50 uIU/mL  CBC w/Diff     Status: Abnormal   Collection Time: 08/03/15 11:48 AM  Result Value Ref Range   WBC 6.2 4.0 - 10.5 K/uL   RBC 5.04 3.87 - 5.11 Mil/uL   Hemoglobin 15.1 (H) 12.0 - 15.0 g/dL   HCT 46.1 (H) 36.0 - 46.0 %   MCV 91.6 78.0 - 100.0 fl   MCHC  32.8 30.0 - 36.0 g/dL   RDW 14.6 11.5 - 15.5 %   Platelets 171.0 150.0 - 400.0 K/uL   Neutrophils Relative % 60.7 43.0 - 77.0 %   Lymphocytes Relative 29.7 12.0 - 46.0 %   Monocytes Relative 5.9 3.0 -  12.0 %   Eosinophils Relative 3.1 0.0 - 5.0 %   Basophils Relative 0.6 0.0 - 3.0 %   Neutro Abs 3.8 1.4 - 7.7 K/uL   Lymphs Abs 1.9 0.7 - 4.0 K/uL   Monocytes Absolute 0.4 0.1 - 1.0 K/uL   Eosinophils Absolute 0.2 0.0 - 0.7 K/uL   Basophils Absolute 0.0 0.0 - 0.1 K/uL  Urine Microalbumin w/creat. ratio     Status: Abnormal   Collection Time: 08/03/15 11:48 AM  Result Value Ref Range   Microalb, Ur 5.5 (H) 0.0 - 1.9 mg/dL   Creatinine,U 112.1 mg/dL   Microalb Creat Ratio 4.9 0.0 - 30.0 mg/g  Basic metabolic panel     Status: Abnormal   Collection Time: 08/03/15 11:48 AM  Result Value Ref Range   Sodium 136 135 - 145 mEq/L   Potassium 4.6 3.5 - 5.1 mEq/L   Chloride 102 96 - 112 mEq/L   CO2 29 19 - 32 mEq/L   Glucose, Bld 106 (H) 70 - 99 mg/dL   BUN 14 6 - 23 mg/dL   Creatinine, Ser 0.80 0.40 - 1.20 mg/dL   Calcium 8.9 8.4 - 10.5 mg/dL   GFR 75.78 >60.00 mL/min  B12     Status: Abnormal   Collection Time: 08/03/15 11:48 AM  Result Value Ref Range   Vitamin B-12 >1500 (H) 211 - 911 pg/mL    Assessment/Plan: Asthmatic bronchitis with acute exacerbation Duoneb given with improvement in symptoms and O2. Rx Prednisone taper. Rx Azithromycin and Hycodan. Supportive measures reviewed. Will follow-up in 1 week. DO feel patient would benefit from a nebulizer during these exacerbations. Will write order for home neb treatment through [facility].

## 2015-09-05 NOTE — Progress Notes (Signed)
Pre visit review using our clinic review tool, if applicable. No additional management support is needed unless otherwise documented below in the visit note. 

## 2015-09-05 NOTE — Addendum Note (Signed)
Addended by: Tasia Catchings on: 09/05/2015 02:40 PM   Modules accepted: Orders, Medications

## 2015-09-05 NOTE — Assessment & Plan Note (Signed)
Duoneb given with improvement in symptoms and O2. Rx Prednisone taper. Rx Azithromycin and Hycodan. Supportive measures reviewed. Will follow-up in 1 week. DO feel patient would benefit from a nebulizer during these exacerbations. Will write order for home neb treatment through [facility].

## 2015-09-06 ENCOUNTER — Telehealth: Payer: Self-pay | Admitting: Family

## 2015-09-06 NOTE — Telephone Encounter (Signed)
[facility] is calling asking about the nebulizer order.  Are they supposed to supply the duoneb as well as the machine.  The order does not state that

## 2015-09-06 NOTE — Telephone Encounter (Signed)
Just the machine please

## 2015-09-07 ENCOUNTER — Telehealth: Payer: Self-pay | Admitting: *Deleted

## 2015-09-07 ENCOUNTER — Ambulatory Visit: Payer: PRIVATE HEALTH INSURANCE | Admitting: Family

## 2015-09-07 NOTE — Telephone Encounter (Signed)
Received fax from [place] RE: patient px [place] and requesting signed Rx to be faxed back as well as confirmation of information on paperwork; forwarded to provider/SLS 01/13

## 2015-09-11 ENCOUNTER — Other Ambulatory Visit: Payer: Self-pay | Admitting: Family

## 2015-09-11 ENCOUNTER — Telehealth: Payer: Self-pay | Admitting: Family

## 2015-09-11 NOTE — Telephone Encounter (Signed)
Marcie from [facility] needs Demographics and Office Notes from 09/05/15 visit with [facility] faxed to  [facility]: [phone]

## 2015-09-11 NOTE — Telephone Encounter (Signed)
Below info faxed.

## 2015-09-12 ENCOUNTER — Ambulatory Visit (INDEPENDENT_AMBULATORY_CARE_PROVIDER_SITE_OTHER): Payer: Medicare Other | Admitting: Family

## 2015-09-12 ENCOUNTER — Encounter: Payer: Self-pay | Admitting: Family

## 2015-09-12 VITALS — BP 142/80 | HR 87 | Temp 98.3°F | Resp 20 | Ht 62.0 in | Wt 201.6 lb

## 2015-09-12 DIAGNOSIS — B370 Candidal stomatitis: Secondary | ICD-10-CM | POA: Diagnosis not present

## 2015-09-12 DIAGNOSIS — J45901 Unspecified asthma with (acute) exacerbation: Secondary | ICD-10-CM | POA: Diagnosis not present

## 2015-09-12 MED ORDER — NYSTATIN 100000 UNIT/ML MT SUSP: OROMUCOSAL | Status: DC

## 2015-09-12 MED FILL — NYSTATIN 100,000 UNITS/ML S: 100000 | 9 days supply | Qty: 180 | Fill #0

## 2015-09-12 NOTE — Patient Instructions (Signed)
Please start nystatin suspension for thrush. Continue albuterol nebulizer every 6 hours for next few days, then as needed.

## 2015-09-12 NOTE — Progress Notes (Signed)
Pre visit review using our clinic review tool, if applicable. No additional management support is needed unless otherwise documented below in the visit note. 

## 2015-09-12 NOTE — Assessment & Plan Note (Signed)
Resolving, advised pt to continue mucinex for congestion and q6h albuterol nebs for next few days.

## 2015-09-12 NOTE — Progress Notes (Signed)
Subjective:    Patient ID: Sheryl [name], female    DOB: 15-Sep-1945, 70 y.o.   MRN: [id number]  HPI  Sheryl [name] is a 70 yr old female who presents today for a 1 week follow up of her asthmatic bronchitis exacerbation.   She was seen by Elyn Aquas 09/04/14 and was treated with pred taper, zpak and hycodan.  She reports improvement of her symptoms.  Cough is improved.  She reports that sore throat "came and went."  She reports that her throat got "real dry" after using the nebulizer.  She tried echinacea tea and this helped.    Review of Systems See HPI  Past Medical History  Diagnosis Date  . Hyperlipidemia   . Thyroid disease     hypothyroid  . Anemia     pernicious  . Asthma   . Diabetes mellitus     type 2  . Tobacco abuse   . Sjogren's syndrome ([place]) 03/17/2011  . Rheumatoid arthritis(714.0)   . Pericarditis 07/26/2012  . History of pericarditis 07/26/2012  . Small bowel obstruction ([place]) 03/2014    ?due to adhesions from colon surgery per pt  . Colon cancer ([place]) 2012  . Fatty liver 01/19/2015    Social History   Social History  . Marital Status: Single    Spouse Name: N/A  . Number of Children: 1  . Years of Education: N/A   Occupational History  . unemployed    Social History Main Topics  . Smoking status: Former Smoker    Types: Cigarettes  . Smokeless tobacco: Not on file     Comment: 3 cigarettes a month. Uses smokeless cigarette.  . Alcohol Use: No     Comment: Has not had alcohol since SBO 03/2014  . Drug Use: Yes     Comment: Smokes "medical grade" marijuana 2 puffs 3 x weekly for rheumatoid arthritis.per pt  . Sexual Activity: Not on file   Other Topics Concern  . Not on file   Social History Narrative   Regular exercise:  Stretching exercises. Resistance bands   Caffeine: 1 mug (2cus) daily.          Past Surgical History  Procedure Laterality Date  . Knee surgery  2006    arthroscopic left knee  . Knee surgery  2001    right  knee  . Dilation and curettage of uterus  1975  . Colon surgery  8/.23/13    tumor removed from sigmoid colon.    Family History  Problem Relation Age of Onset  . Heart disease Other     CAD  . Diabetes Other   . Hyperlipidemia Other   . Stroke Other     Allergies  Allergen Reactions  . Levofloxacin Shortness Of Breath and Rash  . Ativan [Lorazepam]     Visual hallucinations.   . Cefuroxime Axetil     REACTION: asthma, cough  . Hydrocodone-Homatropine Nausea Only  . Rosuvastatin Calcium     Muscle pain, syncope  . Statins     Muscle pain, syncope    Current Outpatient Prescriptions on File Prior to Visit  Medication Sig Dispense Refill  . albuterol (VENTOLIN HFA) 108 (90 BASE) MCG/ACT inhaler Inhale 2 puffs into the lungs every 6 (six) hours as needed. 3 Inhaler 1  . aspirin 81 MG tablet Take 81 mg by mouth daily.      . budesonide-formoterol (SYMBICORT) 160-4.5 MCG/ACT inhaler Inhale 2 puffs into the lungs 2 (two) times  daily. 3 Inhaler 1  . Cyanocobalamin (B-12) 1000 MCG CAPS Take 1,000 mcg by mouth daily.    . diclofenac sodium (VOLTAREN) 1 % GEL Apply 2 g topically 4 (four) times daily as needed. 100 g 2  . fish oil-omega-3 fatty acids 1000 MG capsule Take 2 capsules (2 g total) by mouth 2 (two) times daily.    . fluticasone (FLONASE) 50 MCG/ACT nasal spray Place 2 sprays into the nose daily as needed.    Marland Kitchen levothyroxine (SYNTHROID, LEVOTHROID) 100 MCG tablet TAKE 1 TABLET EVERY DAY 90 tablet 1  . montelukast (SINGULAIR) 10 MG tablet TAKE 1 TABLET AT BEDTIME 90 tablet 1  . multivitamin (THERAGRAN) per tablet Take 1 tablet by mouth daily.      . NON FORMULARY TUMERIC MILK.    Marland Kitchen nystatin (MYCOSTATIN) 100000 UNIT/ML suspension TAKE 5 MLS BY MOUTH 4 TIMES DAILY. 180 mL 0  . ondansetron (ZOFRAN-ODT) 8 MG disintegrating tablet Take 1 tablet (8 mg total) by mouth every 8 (eight) hours as needed for nausea. 20 tablet 3  . pantoprazole (PROTONIX) 40 MG tablet TAKE 1 TABLET  EVERY DAY 90 tablet 1  . triamcinolone cream (KENALOG) 0.1 % Apply 1 application topically 2 (two) times daily. 30 g 0  . zolpidem (AMBIEN) 5 MG tablet Take 1 tablet (5 mg total) by mouth at bedtime as needed for sleep. 90 tablet 1   Current Facility-Administered Medications on File Prior to Visit  Medication Dose Route Frequency Provider Last Rate Last Dose  . cyanocobalamin ((VITAMIN B-12)) injection 1,000 mcg  1,000 mcg Intramuscular Q30 days [name], NP   1,000 mcg at 08/03/15 1148    BP 142/80 mmHg  Pulse 87  Temp(Src) 98.3 F (36.8 C) (Oral)  Resp 20  Ht 5\' 2"  (1.575 m)  Wt 201 lb 9.6 oz (91.445 kg)  BMI 36.86 kg/m2  SpO2 97%       Objective:   Physical Exam  Constitutional: She appears well-developed and well-nourished.  HENT:  Right Ear: Tympanic membrane and ear canal normal.  Left Ear: Tympanic membrane and ear canal normal.  Mouth/Throat: No posterior oropharyngeal edema or posterior oropharyngeal erythema.  + white patches noted on togue  Cardiovascular: Normal rate, regular rhythm and normal heart sounds.   No murmur heard. Pulmonary/Chest: Effort normal and breath sounds normal. No respiratory distress.  Soft left sided expiratory wheeze.  Psychiatric: She has a normal mood and affect. Her behavior is normal. Judgment and thought content normal.          Assessment & Plan:  Oral thrush- rx with nystatin suspension

## 2015-09-20 ENCOUNTER — Ambulatory Visit: Payer: PRIVATE HEALTH INSURANCE | Admitting: Family Medicine

## 2015-09-20 ENCOUNTER — Encounter (HOSPITAL_BASED_OUTPATIENT_CLINIC_OR_DEPARTMENT_OTHER): Payer: Self-pay | Admitting: *Deleted

## 2015-09-20 ENCOUNTER — Telehealth: Payer: Self-pay | Admitting: Family

## 2015-09-20 ENCOUNTER — Emergency Department (HOSPITAL_BASED_OUTPATIENT_CLINIC_OR_DEPARTMENT_OTHER): Payer: Medicare Other

## 2015-09-20 ENCOUNTER — Emergency Department (HOSPITAL_BASED_OUTPATIENT_CLINIC_OR_DEPARTMENT_OTHER)
Admission: EM | Admit: 2015-09-20 | Discharge: 2015-09-20 | Disposition: A | Discharge status: Home / Self Care | Payer: Medicare Other | Attending: Emergency Medicine | Admitting: Emergency Medicine

## 2015-09-20 DIAGNOSIS — Z7982 Long term (current) use of aspirin: Secondary | ICD-10-CM | POA: Insufficient documentation

## 2015-09-20 DIAGNOSIS — M069 Rheumatoid arthritis, unspecified: Secondary | ICD-10-CM | POA: Diagnosis not present

## 2015-09-20 DIAGNOSIS — R05 Cough: Secondary | ICD-10-CM | POA: Diagnosis not present

## 2015-09-20 DIAGNOSIS — Z87891 Personal history of nicotine dependence: Secondary | ICD-10-CM | POA: Diagnosis not present

## 2015-09-20 DIAGNOSIS — Z85038 Personal history of other malignant neoplasm of large intestine: Secondary | ICD-10-CM | POA: Diagnosis not present

## 2015-09-20 DIAGNOSIS — D510 Vitamin B12 deficiency anemia due to intrinsic factor deficiency: Secondary | ICD-10-CM | POA: Diagnosis not present

## 2015-09-20 DIAGNOSIS — J40 Bronchitis, not specified as acute or chronic: Secondary | ICD-10-CM

## 2015-09-20 DIAGNOSIS — Z8719 Personal history of other diseases of the digestive system: Secondary | ICD-10-CM | POA: Diagnosis not present

## 2015-09-20 DIAGNOSIS — Z7952 Long term (current) use of systemic steroids: Secondary | ICD-10-CM | POA: Insufficient documentation

## 2015-09-20 DIAGNOSIS — J45909 Unspecified asthma, uncomplicated: Secondary | ICD-10-CM | POA: Insufficient documentation

## 2015-09-20 DIAGNOSIS — Z8679 Personal history of other diseases of the circulatory system: Secondary | ICD-10-CM | POA: Insufficient documentation

## 2015-09-20 DIAGNOSIS — Z79899 Other long term (current) drug therapy: Secondary | ICD-10-CM | POA: Diagnosis not present

## 2015-09-20 DIAGNOSIS — E785 Hyperlipidemia, unspecified: Secondary | ICD-10-CM | POA: Diagnosis not present

## 2015-09-20 DIAGNOSIS — Z7951 Long term (current) use of inhaled steroids: Secondary | ICD-10-CM | POA: Insufficient documentation

## 2015-09-20 DIAGNOSIS — E039 Hypothyroidism, unspecified: Secondary | ICD-10-CM | POA: Diagnosis not present

## 2015-09-20 DIAGNOSIS — E119 Type 2 diabetes mellitus without complications: Secondary | ICD-10-CM | POA: Insufficient documentation

## 2015-09-20 MED ORDER — BENZONATATE 100 MG PO CAPS: 100.0000 mg | ORAL_CAPSULE | Freq: Three times a day (TID) | ORAL | Status: DC

## 2015-09-20 MED FILL — BENZONATATE 100 MG CAPSULE: 100 | 7 days supply | Qty: 21 | Fill #0

## 2015-09-20 NOTE — ED Notes (Signed)
Cough for a month. Hx of pneumonia in the past. Some relief with inhalers and nebulizer.

## 2015-09-20 NOTE — ED Provider Notes (Signed)
CSN: [id number]     Arrival date & time 09/20/15  1122 History   First MD Initiated Contact with Patient 09/20/15 1204     Chief Complaint  Patient presents with  . Cough    HPI   Sheryl [name] is an 70 y.o. female with history of asthma, DM, HLD, RA, h/o colon cancer who presents to the ED for evaluation of cough. She states she has had a cough for the past month. She describes the cough as initially productive but recently completed a course of prednisone and antibiotics prescribed by her PCP and the sputum/congestion has resolved. She reports continued lingering dry cough. She states she does have asthma and takes Symbicort daily at baseline, and has been using an albuterol nebulizer prescribed by her PCP for this URI. STates that at baseline she does not require her rescue inhaler. States that over the course of this illness she has been using her nebulizer to relieve wheezing and shortness of breath after coughing fits. Initially had been using neb q4-6h, now only using morning and night. States sometimes she'll have a coughing fit and then feel short of breath. Denies chest pain or diaphoresis. States she continues to feel lower energy than baseline but she feels overall improved since onset of symptoms one month ago. States she has been afebrile at home. Denies weakness or dizziness.  Denies abdominal pain, N/V/D.  Past Medical History  Diagnosis Date  . Hyperlipidemia   . Thyroid disease     hypothyroid  . Anemia     pernicious  . Asthma   . Diabetes mellitus     type 2  . Tobacco abuse   . Sjogren's syndrome ([place]) 03/17/2011  . Rheumatoid arthritis(714.0)   . Pericarditis 07/26/2012  . History of pericarditis 07/26/2012  . Small bowel obstruction ([place]) 03/2014    ?due to adhesions from colon surgery per pt  . Colon cancer ([place]) 2012  . Fatty liver 01/19/2015   Past Surgical History  Procedure Laterality Date  . Knee surgery  2006    arthroscopic left knee  . Knee surgery  2001     right knee  . Dilation and curettage of uterus  1975  . Colon surgery  8/.23/13    tumor removed from sigmoid colon.   Family History  Problem Relation Age of Onset  . Heart disease Other     CAD  . Diabetes Other   . Hyperlipidemia Other   . Stroke Other    Social History  Substance Use Topics  . Smoking status: Former Smoker    Types: Cigarettes  . Smokeless tobacco: None     Comment: 3 cigarettes a month. Uses smokeless cigarette.  . Alcohol Use: No     Comment: Has not had alcohol since SBO 03/2014   OB History    No data available     Review of Systems  All other systems reviewed and are negative.     Allergies  Levofloxacin; Ativan; Cefuroxime axetil; Hydrocodone-homatropine; Rosuvastatin calcium; and Statins  Home Medications   Prior to Admission medications   Medication Sig Start Date End Date Taking? Authorizing Provider  albuterol (VENTOLIN HFA) 108 (90 BASE) MCG/ACT inhaler Inhale 2 puffs into the lungs every 6 (six) hours as needed. 01/08/15   [name], NP  aspirin 81 MG tablet Take 81 mg by mouth daily.      Historical Provider, MD  budesonide-formoterol (SYMBICORT) 160-4.5 MCG/ACT inhaler Inhale 2 puffs into the lungs 2 (two)  times daily. 01/08/15   [name], NP  Cyanocobalamin (B-12) 1000 MCG CAPS Take 1,000 mcg by mouth daily. 02/01/15   [name], NP  diclofenac sodium (VOLTAREN) 1 % GEL Apply 2 g topically 4 (four) times daily as needed. 08/03/15   [name], NP  fish oil-omega-3 fatty acids 1000 MG capsule Take 2 capsules (2 g total) by mouth 2 (two) times daily. 02/16/13   [name], NP  fluticasone (FLONASE) 50 MCG/ACT nasal spray Place 2 sprays into the nose daily as needed. 09/21/12   [name], NP  levothyroxine (SYNTHROID, LEVOTHROID) 100 MCG tablet TAKE 1 TABLET EVERY DAY 09/11/15   [name], NP  montelukast (SINGULAIR) 10 MG tablet TAKE 1 TABLET AT BEDTIME 09/11/15   [name], NP  multivitamin [facility]) per tablet Take 1 tablet by mouth daily.      Historical Provider, MD  NON FORMULARY TUMERIC MILK.    Historical Provider, MD  nystatin (MYCOSTATIN) 100000 UNIT/ML suspension TAKE 5 MLS BY MOUTH 4 TIMES DAILY. 09/12/15   [name], NP  ondansetron (ZOFRAN-ODT) 8 MG disintegrating tablet Take 1 tablet (8 mg total) by mouth every 8 (eight) hours as needed for nausea. 05/02/14   Brunetta Jeans, PA-C  pantoprazole (PROTONIX) 40 MG tablet TAKE 1 TABLET EVERY DAY 09/11/15   [name], NP  triamcinolone cream (KENALOG) 0.1 % Apply 1 application topically 2 (two) times daily. 02/16/15   [name], NP  zolpidem (AMBIEN) 5 MG tablet Take 1 tablet (5 mg total) by mouth at bedtime as needed for sleep. 11/22/13   Mosie Lukes, MD   BP 133/86 mmHg  Pulse 88  Temp(Src) 98.5 F (36.9 C) (Oral)  Resp 16  Ht 5' 2.25" (1.581 m)  Wt 90.719 kg  BMI 36.29 kg/m2  SpO2 100% Physical Exam  Constitutional: She is oriented to person, place, and time.  HENT:  Right Ear: External ear normal.  Left Ear: External ear normal.  Nose: Nose normal.  Mouth/Throat: Oropharynx is clear and moist. No oropharyngeal exudate.  Eyes: Conjunctivae and EOM are normal. Pupils are equal, round, and reactive to light.  Neck: Normal range of motion. Neck supple.  Cardiovascular: Normal rate, regular rhythm, normal heart sounds and intact distal pulses.   Pulmonary/Chest: Effort normal and breath sounds normal. She has no wheezes. She has no rales. She exhibits no tenderness.  Abdominal: Soft. Bowel sounds are normal. She exhibits no distension. There is no tenderness.  Musculoskeletal: She exhibits no edema.  Neurological: She is alert and oriented to person, place, and time. No cranial nerve deficit.  Skin: Skin is warm and dry.  Psychiatric: She has a normal mood and affect.  Nursing note and vitals reviewed.   ED Course  Procedures (including critical care  time) Labs Review Labs Reviewed - No data to display  Imaging Review Dg Chest 2 View  09/20/2015  CLINICAL DATA:  Cough and congestion for 1 month, asthma, diabetes mellitus, hyperlipidemia, rheumatoid arthritis, history colon cancer and small bowel obstruction, former smoker EXAM: CHEST  2 VIEW COMPARISON:  07/13/2012 FINDINGS: Normal heart size, mediastinal contours, and pulmonary vascularity. Minimal chronic peribronchial thickening. Lungs otherwise clear. No pleural effusion or pneumothorax. Bones unremarkable. IMPRESSION: Minimal chronic bronchitic changes without infiltrate. Electronically Signed   By: Lavonia Dana M.D.   On: 09/20/2015 12:02   I have personally reviewed and evaluated these images and lab results as part of my medical decision-making.   EKG Interpretation None  MDM   Final diagnoses:  Bronchitis     CXR shows evidence of minimal bronchitic changes. No infiltrate. Pt is not hypoxic. She is afebrile and not tachycardic. I hear no wheezing or rhonchi on her exam. She has no edema or other findings to suggest fluid overload. Wells score 1 and I have low suspicion for PE. She has no cardiac symptoms (chest pain, diaphoresis) or other findings to suggest ACS. I discussed with pt that at this time it appears that pt has acute bronchitis, which is likely viral. Discussed that especially since she recently finished antibiotic and steroid therapy and feels better (though not completely back to baseline) I do not think a second course of antibiotics are indicated at this time. Pt has mucinex and albuterol neb at home. Rx given for tessalon. Instructed to continue mucinex and nebulizer as prescribed. Instructed to f/u with PCP early next week. Strict ER return precautions given the patient verbalized understanding and agreement.     Anne Ng, PA-C 09/20/15 1332  Quintella Reichert, MD 09/21/15 [phone]

## 2015-09-20 NOTE — Telephone Encounter (Signed)
Appt at [facility] was cancelled.  Appointment Information Name: Sheryl [name], Sheryl [name] MRN: [id number] Date: 09/20/2015 Status: Can Time: 11:00 AM Length: 30 Visit Type: ACUTE [193] Copay: $0.00 Provider: Ma Hillock, DO Department: [facility] Referring Provider: [name] CSN: [id number] Notes: asthma and bronchitis patient refused ER visit.esm Made On: Change Notes: Canceled: 09/20/2015 9:50 AM 09/20/2015 9:55 AM 09/20/2015 10:08 AM By: Oneida Alar By: Oneida Alar By: Ralph Dowdy Cancel Rsn: Patient (pt decided to go to ER.)

## 2015-09-20 NOTE — Telephone Encounter (Addendum)
Pt has had multiple appts with providers here in office for Asthma with acute exacerbation since 06/05/15.  Spoke to patient.  No acute distress noted over the phone.  Able to speak in complete sentences without stopping.  However, she stops occasionally to cough.  Sounds hoarse, but clear enough to understand.    Pt states she's feeling better now after a breathing treatment.  She denied shortness of breath and wheezing.  Says she's no longer feeling heaviness in chest (that she just reported to [facility]).  Pt declined going to ER after being strongly advised several time.  Pt states every since she stopped advair and switched to symbicort, she's had this problem (asthma flare up).  States symbicort is not working.    Pt's PCP not here.  Pt agrees to see provider in [place].  Appt scheduled with Dr. Adelina Mings at [facility] [place].

## 2015-09-20 NOTE — Discharge Instructions (Signed)
You were seen in the emergency room today for evaluation of a cough. Your chest x-ray showed evidence of bronchitis-like changes but did not show pneumonia or other acute findings. Your exam was normal and you did not have any wheezing. Your oxygen levels were good. Please continue to the mucinex you have at home and the albuterol nebulizer every six hours as needed. Please follow up with your primary care provider early next week. Return to the ER for new or worsening symptoms.

## 2015-09-20 NOTE — ED Notes (Signed)
MD at bedside. 

## 2015-09-20 NOTE — Telephone Encounter (Signed)
Patient Name: Sheryl [name] DOB: [birth year] Initial Comment Caller states she has been sick for almost a month. She was seen and given antibiotic and nebulizer. Never went away. Doing nebulizer treatments 2-3 times a day. Had a follow up appt last week. She started running a fever over the weekend. Happens the afternoon. Her breathing is still not "up to par". She has a heaviness in her chest. Gets out of breath easily. Switched from advair to [employer] and the symbicort doesnt work, the advair is to expensive. Would like an alternative. She thinks the inhaler is making her asthma worse. Nurse Assessment Nurse: Vallery Sa, RN, Cathy Date/Time (Eastern Time): 09/20/2015 9:14:10 AM Confirm and document reason for call. If symptomatic, describe symptoms. You must click the next button to save text entered. ---No severe breathing difficulty. Alert and responsive. She developed a cough and has had Asthma flare up for the past month. She has had fever on and off for the past month (current temperature 99.0 oral) She has had chest tightness on and off for the past month. No chest pain. She hasn't coughed up blood. Has the patient traveled out of the country within the last 30 days? ---No Does the patient have any new or worsening symptoms? ---Yes Will a triage be completed? ---Yes Related visit to physician within the last 2 weeks? ---Yes Does the PT have any chronic conditions? (i.e. diabetes, asthma, etc.) ---Yes List chronic conditions. ---Bronchitis, Asthma, RA, Thyroid problems Is this a behavioral health or substance abuse call? ---No Guidelines Guideline Title Affirmed Question Affirmed Notes Asthma Attack [1] SEVERE asthma attack (e.g., very SOB at rest, speaks in single words, loud wheezes) AND [2] not resolved after 2 nebulizer or inhaler treatments Final Disposition User Go to ED Now Vallery Sa, RN, Cathy Comments Caller declined the Go to ER disposition. Reinforced the  Go to ER disposition. She asks if MD can change some of her medications. Called the office backline and Nurse Caryl Pina will assist her further. Referrals GO TO FACILITY REFUSED Disagree/Comply: Disagree Disagree/Comply Reason: Disagree with instructions

## 2015-09-25 ENCOUNTER — Encounter: Payer: Self-pay | Admitting: Family

## 2015-09-25 ENCOUNTER — Ambulatory Visit (INDEPENDENT_AMBULATORY_CARE_PROVIDER_SITE_OTHER): Payer: Medicare Other | Admitting: Family

## 2015-09-25 VITALS — BP 130/82 | HR 96 | Temp 98.1°F | Resp 18 | Ht 62.0 in | Wt 197.6 lb

## 2015-09-25 DIAGNOSIS — H8110 Benign paroxysmal vertigo, unspecified ear: Secondary | ICD-10-CM | POA: Diagnosis not present

## 2015-09-25 DIAGNOSIS — J208 Acute bronchitis due to other specified organisms: Secondary | ICD-10-CM

## 2015-09-25 MED ORDER — MECLIZINE HCL 25 MG PO TABS: 25.0000 mg | ORAL_TABLET | Freq: Three times a day (TID) | ORAL | Status: DC | PRN

## 2015-09-25 MED FILL — MECLIZINE 25 MG TABLET: 25 | 10 days supply | Qty: 30 | Fill #0

## 2015-09-25 NOTE — Patient Instructions (Addendum)
Continue zyrtec.   OK to use meclizine as needed for vertigo.  Continue albuterol nebs as needed.  Call if symptoms worsen, if fever >101, or if symptoms don't continue to improve.

## 2015-09-25 NOTE — Progress Notes (Signed)
Subjective:    Patient ID: Sheryl [name], female    DOB: 04-Jun-1946, 70 y.o.   MRN: [id number]  HPI  Sheryl [name] is a 70 year old female who presents today for follow up of her bronchitis. She was seen in the ED on 09/20/15.  ED record is reviewed. She had CXR which was negative for infiltrate. She was told viral bronchitis and prescribed tessalon and advised to continue mucinex.  Reports intermittent fever (99-100) around 4pm every day. No fever yesterday.  She reports that she is using mucinex which is helping her to bring up mucous.  She is using albuterol neb every 4 hours. She continues symbicort. Pt reports feeling tired but breathing better.   Reports intermittent dizziness.  Worse with bending forward.   Review of Systems See HPI  Past Medical History  Diagnosis Date  . Hyperlipidemia   . Thyroid disease     hypothyroid  . Anemia     pernicious  . Asthma   . Diabetes mellitus     type 2  . Tobacco abuse   . Sjogren's syndrome ([place]) 03/17/2011  . Rheumatoid arthritis(714.0)   . Pericarditis 07/26/2012  . History of pericarditis 07/26/2012  . Small bowel obstruction ([place]) 03/2014    ?due to adhesions from colon surgery per pt  . Colon cancer ([place]) 2012  . Fatty liver 01/19/2015    Social History   Social History  . Marital Status: Single    Spouse Name: N/A  . Number of Children: 1  . Years of Education: N/A   Occupational History  . unemployed    Social History Main Topics  . Smoking status: Former Smoker    Types: Cigarettes  . Smokeless tobacco: Not on file     Comment: 3 cigarettes a month. Uses smokeless cigarette.  . Alcohol Use: No     Comment: Has not had alcohol since SBO 03/2014  . Drug Use: Yes     Comment: Smokes "medical grade" marijuana 2 puffs 3 x weekly for rheumatoid arthritis.per pt  . Sexual Activity: Not on file   Other Topics Concern  . Not on file   Social History Narrative   Regular exercise:  Stretching exercises.  Resistance bands   Caffeine: 1 mug (2cus) daily.          Past Surgical History  Procedure Laterality Date  . Knee surgery  2006    arthroscopic left knee  . Knee surgery  2001    right knee  . Dilation and curettage of uterus  1975  . Colon surgery  8/.23/13    tumor removed from sigmoid colon.    Family History  Problem Relation Age of Onset  . Heart disease Other     CAD  . Diabetes Other   . Hyperlipidemia Other   . Stroke Other     Allergies  Allergen Reactions  . Levofloxacin Shortness Of Breath and Rash  . Ativan [Lorazepam]     Visual hallucinations.   . Cefuroxime Axetil     REACTION: asthma, cough  . Hydrocodone-Homatropine Nausea Only  . Rosuvastatin Calcium     Muscle pain, syncope  . Statins     Muscle pain, syncope    Current Outpatient Prescriptions on File Prior to Visit  Medication Sig Dispense Refill  . albuterol (VENTOLIN HFA) 108 (90 BASE) MCG/ACT inhaler Inhale 2 puffs into the lungs every 6 (six) hours as needed. 3 Inhaler 1  . aspirin 81 MG tablet  Take 81 mg by mouth daily.      . budesonide-formoterol (SYMBICORT) 160-4.5 MCG/ACT inhaler Inhale 2 puffs into the lungs 2 (two) times daily. 3 Inhaler 1  . Cyanocobalamin (B-12) 1000 MCG CAPS Take 1,000 mcg by mouth daily.    . diclofenac sodium (VOLTAREN) 1 % GEL Apply 2 g topically 4 (four) times daily as needed. 100 g 2  . fish oil-omega-3 fatty acids 1000 MG capsule Take 2 capsules (2 g total) by mouth 2 (two) times daily.    . fluticasone (FLONASE) 50 MCG/ACT nasal spray Place 2 sprays into the nose daily as needed.    Marland Kitchen levothyroxine (SYNTHROID, LEVOTHROID) 100 MCG tablet TAKE 1 TABLET EVERY DAY 90 tablet 1  . montelukast (SINGULAIR) 10 MG tablet TAKE 1 TABLET AT BEDTIME 90 tablet 1  . multivitamin (THERAGRAN) per tablet Take 1 tablet by mouth daily.      . NON FORMULARY TUMERIC MILK.    Marland Kitchen nystatin (MYCOSTATIN) 100000 UNIT/ML suspension TAKE 5 MLS BY MOUTH 4 TIMES DAILY. 180 mL 0  .  ondansetron (ZOFRAN-ODT) 8 MG disintegrating tablet Take 1 tablet (8 mg total) by mouth every 8 (eight) hours as needed for nausea. 20 tablet 3  . pantoprazole (PROTONIX) 40 MG tablet TAKE 1 TABLET EVERY DAY 90 tablet 1  . triamcinolone cream (KENALOG) 0.1 % Apply 1 application topically 2 (two) times daily. 30 g 0  . zolpidem (AMBIEN) 5 MG tablet Take 1 tablet (5 mg total) by mouth at bedtime as needed for sleep. 90 tablet 1   Current Facility-Administered Medications on File Prior to Visit  Medication Dose Route Frequency Provider Last Rate Last Dose  . cyanocobalamin ((VITAMIN B-12)) injection 1,000 mcg  1,000 mcg Intramuscular Q30 days [name], NP   1,000 mcg at 08/03/15 1148    BP 130/82 mmHg  Pulse 96  Temp(Src) 98.1 F (36.7 C) (Oral)  Resp 18  Ht 5\' 2"  (1.575 m)  Wt 197 lb 9.6 oz (89.631 kg)  BMI 36.13 kg/m2  SpO2 98%       Objective:   Physical Exam  Constitutional: She appears well-developed and well-nourished.  HENT:  Head: Normocephalic and atraumatic.  Right Ear: Tympanic membrane and ear canal normal.  Left Ear: Tympanic membrane and ear canal normal.  Mouth/Throat: No oropharyngeal exudate, posterior oropharyngeal edema or posterior oropharyngeal erythema.  Cardiovascular: Normal rate, regular rhythm and normal heart sounds.   No murmur heard. Pulmonary/Chest: Effort normal and breath sounds normal. No respiratory distress. She has no wheezes.  Psychiatric: She has a normal mood and affect. Her behavior is normal. Judgment and thought content normal.          Assessment & Plan:  Viral Bronchitis- improving. Advised pt as follows. Continue albuterol nebs as needed.  Call if symptoms worsen, if fever >101, or if symptoms don't continue to improve.   Vertigo- New. trial of meclizine.

## 2015-09-25 NOTE — Progress Notes (Signed)
Pre visit review using our clinic review tool, if applicable. No additional management support is needed unless otherwise documented below in the visit note. 

## 2015-10-16 ENCOUNTER — Telehealth: Payer: Self-pay | Admitting: Family

## 2015-10-16 MED ORDER — FLUTICASONE-SALMETEROL 250-50 MCG/DOSE IN AEPB: 1.0000 | INHALATION_SPRAY | Freq: Two times a day (BID) | RESPIRATORY_TRACT | Status: DC

## 2015-10-16 NOTE — Telephone Encounter (Signed)
Pharmacy: [facility],  - [place]  Reason for call: Pt would like to go back to advair vs symbicort. She said it hasn't worked well for her. She has found options of paying for the advair so that isn't a concern. She felt it works better. She doesn't need it right now bc she is going to finish the Symbicort she has. She just wants to make sure Lenna Sciara is aware of change back to Advair and ok with changing back.

## 2015-10-16 NOTE — Telephone Encounter (Signed)
Ok, advair sent.

## 2015-10-17 NOTE — Telephone Encounter (Signed)
Notified pt and she voices understanding. 

## 2015-11-02 ENCOUNTER — Ambulatory Visit: Payer: Medicare Other | Admitting: Family

## 2015-12-06 DIAGNOSIS — J432 Centrilobular emphysema: Secondary | ICD-10-CM | POA: Diagnosis not present

## 2015-12-06 DIAGNOSIS — I77811 Abdominal aortic ectasia: Secondary | ICD-10-CM | POA: Diagnosis not present

## 2015-12-06 DIAGNOSIS — R911 Solitary pulmonary nodule: Secondary | ICD-10-CM | POA: Diagnosis not present

## 2015-12-06 DIAGNOSIS — C186 Malignant neoplasm of descending colon: Secondary | ICD-10-CM | POA: Diagnosis not present

## 2015-12-06 DIAGNOSIS — M47814 Spondylosis without myelopathy or radiculopathy, thoracic region: Secondary | ICD-10-CM | POA: Diagnosis not present

## 2015-12-06 DIAGNOSIS — K8020 Calculus of gallbladder without cholecystitis without obstruction: Secondary | ICD-10-CM | POA: Diagnosis not present

## 2015-12-06 DIAGNOSIS — K449 Diaphragmatic hernia without obstruction or gangrene: Secondary | ICD-10-CM | POA: Diagnosis not present

## 2015-12-06 DIAGNOSIS — I2510 Atherosclerotic heart disease of native coronary artery without angina pectoris: Secondary | ICD-10-CM | POA: Diagnosis not present

## 2015-12-06 DIAGNOSIS — R935 Abnormal findings on diagnostic imaging of other abdominal regions, including retroperitoneum: Secondary | ICD-10-CM | POA: Diagnosis not present

## 2015-12-06 DIAGNOSIS — M47816 Spondylosis without myelopathy or radiculopathy, lumbar region: Secondary | ICD-10-CM | POA: Diagnosis not present

## 2015-12-06 DIAGNOSIS — D3502 Benign neoplasm of left adrenal gland: Secondary | ICD-10-CM | POA: Diagnosis not present

## 2015-12-06 DIAGNOSIS — J439 Emphysema, unspecified: Secondary | ICD-10-CM | POA: Diagnosis not present

## 2015-12-06 DIAGNOSIS — Z9089 Acquired absence of other organs: Secondary | ICD-10-CM | POA: Diagnosis not present

## 2015-12-10 DIAGNOSIS — Z79899 Other long term (current) drug therapy: Secondary | ICD-10-CM | POA: Diagnosis not present

## 2015-12-10 DIAGNOSIS — C189 Malignant neoplasm of colon, unspecified: Secondary | ICD-10-CM | POA: Diagnosis not present

## 2015-12-10 DIAGNOSIS — C186 Malignant neoplasm of descending colon: Secondary | ICD-10-CM | POA: Diagnosis not present

## 2015-12-26 ENCOUNTER — Encounter: Payer: Self-pay | Admitting: Family

## 2015-12-26 ENCOUNTER — Ambulatory Visit (INDEPENDENT_AMBULATORY_CARE_PROVIDER_SITE_OTHER): Payer: Medicare Other | Admitting: Family

## 2015-12-26 VITALS — BP 150/80 | HR 73 | Temp 97.8°F | Resp 18 | Ht 62.75 in | Wt 198.6 lb

## 2015-12-26 DIAGNOSIS — E039 Hypothyroidism, unspecified: Secondary | ICD-10-CM | POA: Diagnosis not present

## 2015-12-26 DIAGNOSIS — R739 Hyperglycemia, unspecified: Secondary | ICD-10-CM | POA: Diagnosis not present

## 2015-12-26 DIAGNOSIS — D510 Vitamin B12 deficiency anemia due to intrinsic factor deficiency: Secondary | ICD-10-CM

## 2015-12-26 DIAGNOSIS — E119 Type 2 diabetes mellitus without complications: Secondary | ICD-10-CM

## 2015-12-26 DIAGNOSIS — J45909 Unspecified asthma, uncomplicated: Secondary | ICD-10-CM

## 2015-12-26 LAB — BASIC METABOLIC PANEL
BUN: 15 mg/dL (ref 6–23)
CALCIUM: 9.1 mg/dL (ref 8.4–10.5)
CO2: 27 meq/L (ref 19–32)
CREATININE: 0.77 mg/dL (ref 0.40–1.20)
Chloride: 101 mEq/L (ref 96–112)
GFR: 79.11 mL/min (ref >60.00)
GLUCOSE: 107 mg/dL — AB (ref 70–99)
Potassium: 4.2 mEq/L (ref 3.5–5.1)
SODIUM: 136 meq/L (ref 135–145)

## 2015-12-26 LAB — HEMOGLOBIN A1C: Hgb A1c MFr Bld: 6.3 % (ref 4.6–6.5)

## 2015-12-26 LAB — TSH: TSH: 1.25 u[IU]/mL (ref 0.35–4.50)

## 2015-12-26 MED ORDER — ZOLPIDEM TARTRATE 5 MG PO TABS
5.0000 mg | ORAL_TABLET | Freq: Every evening | ORAL | Status: DC | PRN
Start: 2015-12-26 — End: 2016-02-14

## 2015-12-26 MED ORDER — TRIAMCINOLONE ACETONIDE 0.1 % EX CREA: 1.0000 "application " | TOPICAL_CREAM | Freq: Two times a day (BID) | CUTANEOUS | Status: DC

## 2015-12-26 MED ORDER — PREDNISONE 10 MG PO TABS: ORAL_TABLET | ORAL | Status: DC

## 2015-12-26 MED FILL — predniSONE 10 MG TABS: 10 | 8 days supply | Qty: 20 | Fill #0

## 2015-12-26 MED FILL — TRIAMCINOLONE 0.1% CREAM: 0.1 | 15 days supply | Qty: 30 | Fill #0

## 2015-12-26 MED FILL — ZOLPIDEM TARTRATE 5 MG TAB: 5 | 30 days supply | Qty: 30 | Fill #0

## 2015-12-26 NOTE — Assessment & Plan Note (Signed)
Deteriorated.  Add short course of oral prednisone. Continue current meds.

## 2015-12-26 NOTE — Assessment & Plan Note (Signed)
Obtain follow up a1c, bmet.

## 2015-12-26 NOTE — Progress Notes (Signed)
Subjective:    Patient ID: Sheryl [name], female    DOB: 05/11/1946, 70 y.o.   MRN: [id number]  HPI  Sheryl [name] is a 70 yr old female who presents today for follow up.  1) DM2- "feel like my sugar is off" Lab Results  Component Value Date   HGBA1C 6.2 08/03/2015   HGBA1C 6.1 01/08/2015   HGBA1C 6.5 09/19/2014   Lab Results  Component Value Date   MICROALBUR 5.5* 08/03/2015   LDLCALC 136* 01/08/2015   CREATININE 0.80 08/03/2015   2) Hypothyroid- currently maintained on synthroid 123mcg. Lab Results  Component Value Date   TSH 1.44 08/03/2015   3) b12 deficiency- Continues otc b12 caps.  4) Edema- reports swelling left foot with blue discoloration occasionally.   5) Asthma- reports recent symptoms have been bad recently. On advair, singulair, zyrtec, prn albuterol.   RA- reports joint pain has flared.  Reports that she has been drinking marijuana tea which helps with her symptoms. Interested in [place] marijuana prescription.   Review of Systems See HPI  Past Medical History  Diagnosis Date  . Hyperlipidemia   . Thyroid disease     hypothyroid  . Anemia     pernicious  . Asthma   . Diabetes mellitus     type 2  . Tobacco abuse   . Sjogren's syndrome ([place]) 03/17/2011  . Rheumatoid arthritis(714.0)   . Pericarditis 07/26/2012  . History of pericarditis 07/26/2012  . Small bowel obstruction ([place]) 03/2014    ?due to adhesions from colon surgery per pt  . Colon cancer ([place]) 2012  . Fatty liver 01/19/2015     Social History   Social History  . Marital Status: Single    Spouse Name: N/A  . Number of Children: 1  . Years of Education: N/A   Occupational History  . unemployed    Social History Main Topics  . Smoking status: Former Smoker    Types: Cigarettes  . Smokeless tobacco: Not on file     Comment: 3 cigarettes a month. Uses smokeless cigarette.  . Alcohol Use: No     Comment: Has not had alcohol since SBO 03/2014  . Drug Use:  Yes     Comment: Smokes "medical grade" marijuana 2 puffs 3 x weekly for rheumatoid arthritis.per pt  . Sexual Activity: Not on file   Other Topics Concern  . Not on file   Social History Narrative   Regular exercise:  Stretching exercises. Resistance bands   Caffeine: 1 mug (2cus) daily.          Past Surgical History  Procedure Laterality Date  . Knee surgery  2006    arthroscopic left knee  . Knee surgery  2001    right knee  . Dilation and curettage of uterus  1975  . Colon surgery  8/.23/13    tumor removed from sigmoid colon.    Family History  Problem Relation Age of Onset  . Heart disease Other     CAD  . Diabetes Other   . Hyperlipidemia Other   . Stroke Other     Allergies  Allergen Reactions  . Levofloxacin Shortness Of Breath and Rash  . Ativan [Lorazepam]     Visual hallucinations.   . Cefuroxime Axetil     REACTION: asthma, cough  . Hydrocodone-Homatropine Nausea Only  . Rosuvastatin Calcium     Muscle pain, syncope  . Statins     Muscle pain, syncope  Current Outpatient Prescriptions on File Prior to Visit  Medication Sig Dispense Refill  . albuterol (VENTOLIN HFA) 108 (90 BASE) MCG/ACT inhaler Inhale 2 puffs into the lungs every 6 (six) hours as needed. 3 Inhaler 1  . aspirin 81 MG tablet Take 81 mg by mouth daily.      . Cyanocobalamin (B-12) 1000 MCG CAPS Take 1,000 mcg by mouth daily.    . diclofenac sodium (VOLTAREN) 1 % GEL Apply 2 g topically 4 (four) times daily as needed. 100 g 2  . fish oil-omega-3 fatty acids 1000 MG capsule Take 2 capsules (2 g total) by mouth 2 (two) times daily.    . fluticasone (FLONASE) 50 MCG/ACT nasal spray Place 2 sprays into the nose daily as needed.    . Fluticasone-Salmeterol (ADVAIR DISKUS) 250-50 MCG/DOSE AEPB Inhale 1 puff into the lungs 2 (two) times daily. 1 each 3  . levothyroxine (SYNTHROID, LEVOTHROID) 100 MCG tablet TAKE 1 TABLET EVERY DAY 90 tablet 1  . meclizine (ANTIVERT) 25 MG tablet Take 1  tablet (25 mg total) by mouth 3 (three) times daily as needed for dizziness. 30 tablet 0  . montelukast (SINGULAIR) 10 MG tablet TAKE 1 TABLET AT BEDTIME 90 tablet 1  . multivitamin (THERAGRAN) per tablet Take 1 tablet by mouth daily.      . NON FORMULARY TUMERIC MILK.    Marland Kitchen ondansetron (ZOFRAN-ODT) 8 MG disintegrating tablet Take 1 tablet (8 mg total) by mouth every 8 (eight) hours as needed for nausea. 20 tablet 3  . pantoprazole (PROTONIX) 40 MG tablet TAKE 1 TABLET EVERY DAY 90 tablet 1  . PE-GG-APAP & PE-DPH-APAP (MUCINEX SINUS-MAX DAY/NIGHT) Liquid MISC Take by mouth as needed.    . triamcinolone cream (KENALOG) 0.1 % Apply 1 application topically 2 (two) times daily. 30 g 0  . zolpidem (AMBIEN) 5 MG tablet Take 1 tablet (5 mg total) by mouth at bedtime as needed for sleep. 90 tablet 1   Current Facility-Administered Medications on File Prior to Visit  Medication Dose Route Frequency Provider Last Rate Last Dose  . cyanocobalamin ((VITAMIN B-12)) injection 1,000 mcg  1,000 mcg Intramuscular Q30 days [name], NP   1,000 mcg at 08/03/15 1148    BP 150/80 mmHg  Pulse 73  Temp(Src) 97.8 F (36.6 C) (Oral)  Resp 18  Ht 5' 2.75" (1.594 m)  Wt 198 lb 9.6 oz (90.084 kg)  BMI 35.45 kg/m2  SpO2 100%       Objective:   Physical Exam  Constitutional: She is oriented to person, place, and time. She appears well-developed and well-nourished.  HENT:  Head: Normocephalic and atraumatic.  Cardiovascular: Normal rate, regular rhythm and normal heart sounds.   No murmur heard. Pulses:      Dorsalis pedis pulses are 2+ on the left side.       Posterior tibial pulses are 2+ on the left side.  Pulmonary/Chest: Effort normal. No respiratory distress. She has wheezes.  Musculoskeletal:  1+ bilateral pedal edema  Neurological: She is alert and oriented to person, place, and time.  Psychiatric: She has a normal mood and affect. Her behavior is normal. Judgment and thought content  normal.          Assessment & Plan:

## 2015-12-26 NOTE — Progress Notes (Signed)
Pre visit review using our clinic review tool, if applicable. No additional management support is needed unless otherwise documented below in the visit note. 

## 2015-12-26 NOTE — Assessment & Plan Note (Signed)
Continue current dose of b12.

## 2015-12-26 NOTE — Assessment & Plan Note (Signed)
Clinically stable on synthroid, continue same, obtain tsh. 

## 2015-12-26 NOTE — Patient Instructions (Signed)
Please complete lab work prior to leaving.  Start prednisone for your asthma. Call if symptoms worsen or do not improve.

## 2015-12-28 ENCOUNTER — Encounter: Payer: Self-pay | Admitting: Family

## 2016-01-11 ENCOUNTER — Telehealth: Payer: Self-pay | Admitting: *Deleted

## 2016-01-11 NOTE — Telephone Encounter (Signed)
Unable to reach patient at time of CCM call. Left message for patient to return call when available.

## 2016-01-14 NOTE — Telephone Encounter (Addendum)
Confirmed that the patient has verbally agreed to enroll in the Chronic Care Management Program and reiterated that it's designed to help optimize her health and achieve monthly goals. Patient has been made aware of the fee associated with this program.  Time spent: 33:52 minutes  Conditions/goals reviewed:   Asthma: Pt recently completed prednisone taper for asthma exacerbation. States she is feeling much better, has not had to use rescue inhaler recently. Reports compliance w/ advair and singulair. Pt asked about having a supply of prednisone to keep on hand if needed. Explained to pt that this would not be appropriate, as she would need OV for evaluation of acute asthma flares in order to determine need for and ensure proper dosing of steroids. Pt verbalized understanding. She is aware of her asthma triggers: environmental allergens, pollen, etc. Also states that her asthma tends to flare up along w/ rheumatoid arthritis flares.   DM: Hgb A1c 6.3. Pt reports she eats a lot of vegetables fresh from her garden. She usually drinks Special K protein shakes for breakfast and has at least 2 low calorie Activia yogurts with meals or snacks each day. She does not eat a lot of meat, as it tends to be hard for her to digest. She states she does love ice cream, but she usually tries to substitute w/ low-fat frozen yogurt. States she will occasionally eat a big breakfast of bacon, eggs, etc as a treat. Commended pt on successful dietary control of DM.  Goal: Maintain A1c < 7.   Obesity: BMI 35.5. Pt reported that she is concerned about her weight. She would like to lose weight, but she is not very active. Her activities are limited by her rheumatoid arthritis. She feels that her diet is appropriate, and we agreed that the missing link to her weight loss is likely exercise. Her main activities are working in her garden and doing pilates or weight-lifting a couple of times a week for about 15 minutes. She enjoys  water aerobics and swimming, but states there are no gyms with pools near her house. She sometimes walks at the church across the street from her house because it is flat and does not seem to aggravate her joints, but is currently doing this sporadically.  Goal: Begin walking for at least 15 minutes 3x weekly. Increase activities as tolerated.  Rheumatoid Arthritis: Reports no issues currently. She does not have rheumatologist right now. She prefers herbal/alternative therapies to biologics, as she has used biologics in the past and questions if it contributed to her development of colon CA. She is currently using turmeric tea daily and smoking "medical grade" marijuana 3x weekly for symptom control.  HLD: Pt reports she is intolerant to statins w/ side effect of extreme fatigue, and developed diarrhea w/ Zetia. Discussed importance of healthy diet and regular exercise for control of lipids.  Pt is scheduled for CPE 03/03/16. She would like her handicap parking renewed and will bring paperwork to appointment.

## 2016-01-19 ENCOUNTER — Other Ambulatory Visit: Payer: Self-pay | Admitting: Family

## 2016-01-28 ENCOUNTER — Other Ambulatory Visit: Payer: Self-pay | Admitting: Family

## 2016-02-07 ENCOUNTER — Ambulatory Visit: Payer: Self-pay | Admitting: *Deleted

## 2016-02-07 DIAGNOSIS — E119 Type 2 diabetes mellitus without complications: Secondary | ICD-10-CM

## 2016-02-07 DIAGNOSIS — M069 Rheumatoid arthritis, unspecified: Secondary | ICD-10-CM

## 2016-02-11 NOTE — Progress Notes (Signed)
02/11/2016 1:56 PM  Unable to reach patient at time of CCM call. Left message for patient to return call when available.    02/14/2016 3:26 PM Chart reviewed; CCM call completed  Last OV: 12/26/15 Next appt: 03/03/16   Medications reviewed/reconciled: yes Medication adherence: yes Side effects/intolerances reviewed: yes Refills needed:yes, Ambien refill request sent to PCP Relevant labs reviewed: no, no new labs since last visit  Care Teams:   Patient Care Team: [name], NP as PCP - General Kerin Salen, DDS as Consulting Physician ([place]) [place] as Consulting Physician (Ophthalmology) Lake Bells, MD as Consulting Physician (Gastroenterology) F. Patrica Duel, MD as Consulting Physician (Surgery) Kathrynn Running. Baird Cancer, MD as Consulting Physician (Hematology and Oncology)  Patient Active Problem List   Diagnosis Date Noted  . Plantar fasciitis, bilateral 08/03/2015  . Fatty liver 01/19/2015  . Vaginal atrophy 01/08/2015  . SBO (small bowel obstruction) ([place]) 05/02/2014  . Colon cancer ([place]) 05/26/2012  . Psoriasis 12/29/2011  . Sjogren's syndrome ([place]) 03/17/2011  . General medical examination 12/25/2010  . Insomnia 12/25/2010  . SKIN LESION 09/17/2010  . UNSPECIFIED VITAMIN D DEFICIENCY 05/15/2010  . Asthma 12/26/2009  . Diabetes type 2, controlled ([place]) 09/10/2009  . Depression 09/10/2009  . ALLERGIC RHINITIS 09/10/2009  . ELEVATED BLOOD PRESSURE WITHOUT DIAGNOSIS OF HYPERTENSION 09/10/2009  . Hypothyroidism 06/05/2009  . Hyperlipidemia 06/05/2009  . PERNICIOUS ANEMIA 06/05/2009  . History of tobacco abuse 06/05/2009  . GERD 06/05/2009  . Rheumatoid arthritis ([place]) 06/05/2009    Health Maintenance:  Patient asked to schedule diabetic eye exam-reports it is scheduled in July Due for PPSV-23 and foot exam-CPE scheduled 03/03/16  Health Logs: none   Goals Reviewed: yes Goals    . Exercise 3x per week (15 min per time)    Increase walking as tolerated. Continue chair exercises and increase as tolerated.    Marland Kitchen HEMOGLOBIN A1C < 7.0       Progress towards goals: Has been doing chair exercises 3-5x weekly for 20-25 minutes.  Barriers Identified: yes, the church where she normally walks has been having activities frequently and she has not had a quiet time to walk. RA symptoms have kept her off her feet for several days since we last spoke.  Any patient concerns?: yes, she is currently experiencing RA flare. She is going to try to make an appt w/ local rheumatologist and will let us know if she needs formal referral. She reports she is also experiencing psoriasis flare secondary to RA flare, declines OV at this time as she feels like the psoriasis is starting to resolve. She has stopped smoking marijuana because it was exacerbating her asthma. However, she is drinking marijuana in tea to help w/ RA symptoms.  Plan: Follow-up w/ [name] as scheduled.  Keep appt w/ Dr. Bing Plume for eye exam. Continue to increase activity as tolerated. Continue chair exercises as tolerated. Call insurance to verify coverage for Zostavax.   Time Spent: 34 minutes   Dorrene German, RN

## 2016-02-14 ENCOUNTER — Other Ambulatory Visit: Payer: Self-pay | Admitting: *Deleted

## 2016-02-14 NOTE — Patient Instructions (Addendum)
Follow-up w/ [name] as scheduled.  Keep appt w/ Dr. Bing Plume for eye exam.  Continue to increase activity as tolerated. Continue chair exercises as tolerated.  Call your insurance to verify coverage for Zostavax (shingles vaccine). Bring the paperwork to renew your handicap placard to your next appt. Call us with any questions or concerns.

## 2016-02-14 NOTE — Telephone Encounter (Signed)
Last OV: 12/26/15 Last filled: 12/26/15  Sig: Take 1 tablet (5 mg total) by mouth at bedtime as needed for sleep. UDS: Not on file  Not sure which pharmacy pt wants this to go to if approved. Called and LMOM to return call and let us know preferred pharmacy.

## 2016-02-15 MED ORDER — ZOLPIDEM TARTRATE 5 MG PO TABS: 5.0000 mg | ORAL_TABLET | Freq: Every evening | ORAL | Status: DC | PRN

## 2016-02-15 MED FILL — ZOLPIDEM TARTRATE 5 MG TAB: 5 | 30 days supply | Qty: 30 | Fill #0

## 2016-02-15 NOTE — Telephone Encounter (Signed)
OK to send 30 tabs.

## 2016-02-15 NOTE — Telephone Encounter (Signed)
Rx phoned to local pharmacy.

## 2016-02-18 ENCOUNTER — Other Ambulatory Visit: Payer: Self-pay | Admitting: Family

## 2016-02-18 NOTE — Telephone Encounter (Signed)
Refill was sent to [facility] on 02/15/16.

## 2016-02-18 NOTE — Telephone Encounter (Signed)
Pt says that she need a refill on Ambien Rx. And she would like to have it sent to the [facility].

## 2016-02-18 NOTE — Telephone Encounter (Signed)
Rx sent to the pharmacy by e-script.//AB/CMA 

## 2016-02-18 NOTE — Telephone Encounter (Signed)
Rx was phoned to pharmacy on 02/15/16. Verified with pharmacy that Rx is ready for pick up. Notified pt.

## 2016-02-19 NOTE — Progress Notes (Signed)
Reviewed

## 2016-03-03 ENCOUNTER — Telehealth: Payer: Self-pay | Admitting: Family

## 2016-03-03 ENCOUNTER — Encounter: Payer: Self-pay | Admitting: Family

## 2016-03-03 ENCOUNTER — Ambulatory Visit (INDEPENDENT_AMBULATORY_CARE_PROVIDER_SITE_OTHER): Payer: Medicare Other | Admitting: Family

## 2016-03-03 VITALS — BP 120/72 | HR 92 | Temp 97.4°F | Ht 61.75 in | Wt 191.6 lb

## 2016-03-03 DIAGNOSIS — R7989 Other specified abnormal findings of blood chemistry: Secondary | ICD-10-CM | POA: Diagnosis not present

## 2016-03-03 DIAGNOSIS — Z0000 Encounter for general adult medical examination without abnormal findings: Secondary | ICD-10-CM | POA: Diagnosis not present

## 2016-03-03 DIAGNOSIS — R945 Abnormal results of liver function studies: Secondary | ICD-10-CM

## 2016-03-03 DIAGNOSIS — I77819 Aortic ectasia, unspecified site: Secondary | ICD-10-CM | POA: Diagnosis not present

## 2016-03-03 HISTORY — DX: Aortic ectasia, unspecified site: I77.819

## 2016-03-03 LAB — HEPATIC FUNCTION PANEL
ALT: 68 U/L — ABNORMAL HIGH (ref 0–35)
AST: 78 U/L — AB (ref 0–37)
Albumin: 3.9 g/dL (ref 3.5–5.2)
Alkaline Phosphatase: 80 U/L (ref 39–117)
BILIRUBIN DIRECT: 0.1 mg/dL (ref 0.0–0.3)
BILIRUBIN TOTAL: 0.6 mg/dL (ref 0.2–1.2)
Total Protein: 8.3 g/dL (ref 6.0–8.3)

## 2016-03-03 MED ORDER — ZOSTER VACCINE LIVE 19400 UNT/0.65ML ~~LOC~~ SUSR: 0.6500 mL | Freq: Once | SUBCUTANEOUS | Status: DC

## 2016-03-03 NOTE — Patient Instructions (Signed)
Continue healthy diet, exercise and weight loss efforts.

## 2016-03-03 NOTE — Telephone Encounter (Signed)
Pt brought in a handicap sticker form to be completed by provider. Spoke with pcp, she will fill out and call when form is ready for pick up.

## 2016-03-03 NOTE — Progress Notes (Signed)
Pre visit review using our clinic review tool, if applicable. No additional management support is needed unless otherwise documented below in the visit note. 

## 2016-03-03 NOTE — Progress Notes (Signed)
Subjective:    Sheryl [name] is a 70 y.o. female who presents for Medicare Annual/Subsequent preventive examination.  Preventive Screening-Counseling & Management  Tobacco History  Smoking status  . Former Smoker  . Types: Cigarettes  Smokeless tobacco  . Not on file    Comment: 3 cigarettes a month. Uses smokeless cigarette.     Problems Prior to Visit 1.  Depression- not currently on medication. Reports that she has worked with a therapist after hr hospitalization and this helped a lot.   2.  Asthma- maintained on advair and albuterol and singulair.  Reports asthma symptoms have not been good.   3. Hypothyroid-  Lab Results  Component Value Date   TSH 1.25 12/26/2015   4.  DM2-  Lab Results  Component Value Date   HGBA1C 6.3 12/26/2015   HGBA1C 6.2 08/03/2015   HGBA1C 6.1 01/08/2015   Lab Results  Component Value Date   MICROALBUR 5.5* 08/03/2015   LDLCALC 136* 01/08/2015   CREATININE 0.77 12/26/2015   5. RA- reports symptoms come and goes, notes that she had a recent flare but it is improved.    Patient presents today for complete physical.  Immunizations: due for zostavax Diet: healthy diet Exercise: some exercise Colonoscopy: 5/16 Dexa: 2016 Pap Smear: 6/16 Mammogram:   Current Problems (verified) Patient Active Problem List   Diagnosis Date Noted  . Plantar fasciitis, bilateral 08/03/2015  . Fatty liver 01/19/2015  . Vaginal atrophy 01/08/2015  . SBO (small bowel obstruction) ([place]) 05/02/2014  . Colon cancer ([place]) 05/26/2012  . Psoriasis 12/29/2011  . Sjogren's syndrome ([place]) 03/17/2011  . General medical examination 12/25/2010  . Insomnia 12/25/2010  . SKIN LESION 09/17/2010  . UNSPECIFIED VITAMIN D DEFICIENCY 05/15/2010  . Asthma 12/26/2009  . Diabetes type 2, controlled ([place]) 09/10/2009  . Depression 09/10/2009  . ALLERGIC RHINITIS 09/10/2009  . ELEVATED BLOOD PRESSURE WITHOUT DIAGNOSIS OF HYPERTENSION 09/10/2009  . Hypothyroidism  06/05/2009  . Hyperlipidemia 06/05/2009  . PERNICIOUS ANEMIA 06/05/2009  . History of tobacco abuse 06/05/2009  . GERD 06/05/2009  . Rheumatoid arthritis ([place]) 06/05/2009    Medications Prior to Visit Current Outpatient Prescriptions on File Prior to Visit  Medication Sig Dispense Refill  . albuterol (VENTOLIN HFA) 108 (90 Base) MCG/ACT inhaler Inhale 2 puffs into the lungs every 6 (six) hours as needed for wheezing or shortness of breath. 54 g 1  . aspirin 81 MG tablet Take 81 mg by mouth daily.      . Cyanocobalamin (B-12) 1000 MCG CAPS Take 1,000 mcg by mouth daily.    . diclofenac sodium (VOLTAREN) 1 % GEL Apply 2 g topically 4 (four) times daily as needed. 100 g 2  . fish oil-omega-3 fatty acids 1000 MG capsule Take 2 capsules (2 g total) by mouth 2 (two) times daily.    . fluticasone (FLONASE) 50 MCG/ACT nasal spray Place 2 sprays into the nose daily as needed.    . Fluticasone-Salmeterol (ADVAIR DISKUS) 250-50 MCG/DOSE AEPB Inhale 1 puff into the lungs 2 (two) times daily. 1 each 3  . levothyroxine (SYNTHROID, LEVOTHROID) 100 MCG tablet TAKE 1 TABLET EVERY DAY 90 tablet 1  . montelukast (SINGULAIR) 10 MG tablet TAKE 1 TABLET AT BEDTIME 90 tablet 1  . multivitamin (THERAGRAN) per tablet Take 1 tablet by mouth daily.      . NON FORMULARY TUMERIC MILK.    Marland Kitchen ondansetron (ZOFRAN-ODT) 8 MG disintegrating tablet Take 1 tablet (8 mg total) by mouth every 8 (eight)  hours as needed for nausea. 20 tablet 3  . pantoprazole (PROTONIX) 40 MG tablet TAKE 1 TABLET EVERY DAY 90 tablet 1  . Probiotic Product (PROBIOTIC DAILY PO) Take 1 capsule by mouth daily.    Marland Kitchen triamcinolone cream (KENALOG) 0.1 % Apply 1 application topically 2 (two) times daily. 30 g 0  . zolpidem (AMBIEN) 5 MG tablet Take 1 tablet (5 mg total) by mouth at bedtime as needed for sleep. 30 tablet 0   Current Facility-Administered Medications on File Prior to Visit  Medication Dose Route Frequency Provider Last Rate Last Dose  .  cyanocobalamin ((VITAMIN B-12)) injection 1,000 mcg  1,000 mcg Intramuscular Q30 days [name], NP   1,000 mcg at 08/03/15 1148    Current Medications (verified) Current Outpatient Prescriptions  Medication Sig Dispense Refill  . albuterol (VENTOLIN HFA) 108 (90 Base) MCG/ACT inhaler Inhale 2 puffs into the lungs every 6 (six) hours as needed for wheezing or shortness of breath. 54 g 1  . aspirin 81 MG tablet Take 81 mg by mouth daily.      . Cyanocobalamin (B-12) 1000 MCG CAPS Take 1,000 mcg by mouth daily.    . diclofenac sodium (VOLTAREN) 1 % GEL Apply 2 g topically 4 (four) times daily as needed. 100 g 2  . fish oil-omega-3 fatty acids 1000 MG capsule Take 2 capsules (2 g total) by mouth 2 (two) times daily.    . fluticasone (FLONASE) 50 MCG/ACT nasal spray Place 2 sprays into the nose daily as needed.    . Fluticasone-Salmeterol (ADVAIR DISKUS) 250-50 MCG/DOSE AEPB Inhale 1 puff into the lungs 2 (two) times daily. 1 each 3  . levothyroxine (SYNTHROID, LEVOTHROID) 100 MCG tablet TAKE 1 TABLET EVERY DAY 90 tablet 1  . montelukast (SINGULAIR) 10 MG tablet TAKE 1 TABLET AT BEDTIME 90 tablet 1  . multivitamin (THERAGRAN) per tablet Take 1 tablet by mouth daily.      . NON FORMULARY TUMERIC MILK.    Marland Kitchen ondansetron (ZOFRAN-ODT) 8 MG disintegrating tablet Take 1 tablet (8 mg total) by mouth every 8 (eight) hours as needed for nausea. 20 tablet 3  . pantoprazole (PROTONIX) 40 MG tablet TAKE 1 TABLET EVERY DAY 90 tablet 1  . Probiotic Product (PROBIOTIC DAILY PO) Take 1 capsule by mouth daily.    Marland Kitchen triamcinolone cream (KENALOG) 0.1 % Apply 1 application topically 2 (two) times daily. 30 g 0  . zolpidem (AMBIEN) 5 MG tablet Take 1 tablet (5 mg total) by mouth at bedtime as needed for sleep. 30 tablet 0   Current Facility-Administered Medications  Medication Dose Route Frequency Provider Last Rate Last Dose  . cyanocobalamin ((VITAMIN B-12)) injection 1,000 mcg  1,000 mcg Intramuscular Q30  days [name], NP   1,000 mcg at 08/03/15 1148     Allergies (verified) Levofloxacin; Ativan; Cefuroxime axetil; Hydrocodone-homatropine; Rosuvastatin calcium; Statins; and Zetia   PAST HISTORY  Family History Family History  Problem Relation Age of Onset  . Heart disease Other     CAD  . Diabetes Other   . Hyperlipidemia Other   . Stroke Other     Social History Social History  Substance Use Topics  . Smoking status: Former Smoker    Types: Cigarettes  . Smokeless tobacco: Not on file     Comment: 3 cigarettes a month. Uses smokeless cigarette.  . Alcohol Use: No     Comment: Has not had alcohol since SBO 03/2014     Are there smokers  in your home (other than you)? No  Risk Factors Current exercise habits: yes some  Dietary issues discussed: continue healthy diet, exercise, weight loss efforts.    Cardiac risk factors: advanced age (older than 44 for men, 45 for women) and diabetes mellitus.  Depression Screen (Note: if answer to either of the following is "Yes", a more complete depression screening is indicated)   Over the past two weeks, have you felt down, depressed or hopeless? No  Over the past two weeks, have you felt little interest or pleasure in doing things? No  Have you lost interest or pleasure in daily life? No  Do you often feel hopeless? No  Do you cry easily over simple problems? No  Activities of Daily Living In your present state of health, do you have any difficulty performing the following activities?:  Driving? No Managing money?  No Feeding yourself? No Getting from bed to chair? No . Climbing a flight of stairs? No Preparing food and eating?: No Bathing or showering? No Getting dressed: No Getting to the toilet? No Using the toilet:No Moving around from place to place: No In the past year have you fallen or had a near fall?:No   Are you sexually active?  No  Do you have more than one partner?  No  Hearing Difficulties:  No Do you often ask people to speak up or repeat themselves? No Do you experience ringing or noises in your ears? No Do you have difficulty understanding soft or whispered voices? No   Do you feel that you have a problem with memory? No  Do you often misplace items? No  Do you feel safe at home?  Yes  Cognitive Testing  Alert? Yes  Normal Appearance?Yes  Oriented to person? Yes  Place? Yes   Time? Yes  Recall of three objects?  Yes  Can perform simple calculations? Yes  Displays appropriate judgment?Yes  Can read the correct time from a watch face?Yes   Advanced Directives have been discussed with the patient? Yes  List the Names of Other Physician/Practitioners you currently use: 1.  See care team  Indicate any recent Medical Services you may have received from other than [facility] providers in the past year (date may be approximate).  Immunization History  Administered Date(s) Administered  . Influenza Split 07/07/2011  . Influenza Whole 06/05/2009, 04/24/2010  . Influenza, High Dose Seasonal PF 06/29/2015  . Influenza,inj,Quad PF,36+ Mos 06/01/2013, 05/25/2014  . Pneumococcal Conjugate-13 06/01/2013  . Pneumococcal Polysaccharide-23 10/01/2010  . Td 08/25/2002  . Tdap 02/14/2013    Screening Tests Health Maintenance  Topic Date Due  . Hepatitis C Screening  1946/04/05  . ZOSTAVAX  06/14/2007  . OPHTHALMOLOGY EXAM  03/25/2015  . FOOT EXAM  09/20/2015  . PNA vac Low Risk Adult (2 of 2 - PPSV23) 10/02/2015  . INFLUENZA VACCINE  03/25/2016  . MAMMOGRAM  06/23/2016  . HEMOGLOBIN A1C  06/27/2016  . URINE MICROALBUMIN  08/02/2016  . TETANUS/TDAP  02/15/2023  . COLONOSCOPY  01/01/2025  . DEXA SCAN  Completed    All answers were reviewed with the patient and necessary referrals were made:  O'SULLIVAN,Maizey Menendez S., NP   03/03/2016   History reviewed: allergies, current medications, past family history, past medical history, past social history, past surgical history and  problem list  Review of Systems Pertinent items are noted in HPI.    Objective:    Declines eye exam  Body mass index is 35.35 kg/(m^2). BP 120/72 mmHg  Pulse 92  Temp(Src) 97.4 F (36.3 C) (Oral)  Ht 5' 1.75" (1.568 m)  Wt 191 lb 9.6 oz (86.909 kg)  BMI 35.35 kg/m2  SpO2 97%  Physical Exam  Constitutional: She is oriented to person, place, and time. She appears well-developed and well-nourished. No distress.  HENT:  Head: Normocephalic and atraumatic.  Right Ear: Tympanic membrane and ear canal normal.  Left Ear: Tympanic membrane and ear canal normal.  Mouth/Throat: Oropharynx is clear and moist.  Eyes: Pupils are equal, round, and reactive to light. No scleral icterus.  Neck: Normal range of motion. No thyromegaly present.  Cardiovascular: Normal rate and regular rhythm.   No murmur heard. Pulmonary/Chest: Effort normal and breath sounds normal. No respiratory distress. He has no wheezes. She has no rales. She exhibits no tenderness.  Abdominal: Soft. Bowel sounds are normal. He exhibits no distension and no mass. There is no tenderness. There is no rebound and no guarding.  Musculoskeletal: She exhibits no edema.  Lymphadenopathy:    She has no cervical adenopathy.  Neurological: She is alert and oriented to person, place, and time. She has normal patellar reflexes. She exhibits normal muscle tone. Coordination normal.  Skin: Skin is warm and dry.  Psychiatric: She has a normal mood and affect. Her behavior is normal. Judgment and thought content normal.  Breasts: Examined lying Right: Without masses, retractions, discharge or axillary adenopathy.  Left: Without masses, retractions, discharge or axillary adenopathy.  Pelvic: deferred          Assessment & Plan:        Assessment:      Plan:     During the course of the visit the patient was educated and counseled about appropriate screening and preventive services including:    zostavax   Diet  review for nutrition referral? Yes ____  Not Indicated _x___   Patient Instructions (the written plan) was given to the patient.  Medicare Attestation I have personally reviewed: The patient's medical and social history Their use of alcohol, tobacco or illicit drugs Their current medications and supplements The patient's functional ability including ADLs,fall risks, home safety risks, cognitive, and hearing and visual impairment Diet and physical activities Evidence for depression or mood disorders  The patient's weight, height, BMI, and visual acuity have been recorded in the chart.  I have made referrals, counseling, and provided education to the patient based on review of the above and I have provided the patient with a written personalized care plan for preventive services.     O'SULLIVAN,Thompson Mckim S., NP   03/03/2016

## 2016-03-05 ENCOUNTER — Telehealth: Payer: Self-pay | Admitting: *Deleted

## 2016-03-05 DIAGNOSIS — R945 Abnormal results of liver function studies: Secondary | ICD-10-CM

## 2016-03-05 DIAGNOSIS — R7989 Other specified abnormal findings of blood chemistry: Secondary | ICD-10-CM

## 2016-03-05 NOTE — Telephone Encounter (Signed)
-----   Message from [name], NP sent at 03/03/2016  1:58 PM EDT ----- Please let pt know that her liver function tests are elevated but this is not new for her and they are stable. Most likely cause is fatty liver which has been demonstrated on previous abdominal imaging.  However, I would like for her to complete a hepatitis C screen please (dx abnormal LFT). Thanks

## 2016-03-05 NOTE — Telephone Encounter (Signed)
Called and spoke with the pt and informed her of recent lab results and note.  Pt verbalized understanding and agreed.  Future lab ordered and sent.  Pt scheduled a lab appt for (Friday-03/07/16 @ 10:15am).//AB/CMA

## 2016-03-07 ENCOUNTER — Other Ambulatory Visit (INDEPENDENT_AMBULATORY_CARE_PROVIDER_SITE_OTHER): Payer: Medicare Other

## 2016-03-07 DIAGNOSIS — R7989 Other specified abnormal findings of blood chemistry: Secondary | ICD-10-CM | POA: Diagnosis not present

## 2016-03-07 DIAGNOSIS — R945 Abnormal results of liver function studies: Secondary | ICD-10-CM

## 2016-03-08 ENCOUNTER — Encounter: Payer: Self-pay | Admitting: Family

## 2016-03-08 LAB — HEPATITIS C ANTIBODY: HCV AB: NEGATIVE

## 2016-03-17 DIAGNOSIS — C186 Malignant neoplasm of descending colon: Secondary | ICD-10-CM | POA: Diagnosis not present

## 2016-03-19 ENCOUNTER — Other Ambulatory Visit: Payer: Self-pay | Admitting: *Deleted

## 2016-03-19 NOTE — Telephone Encounter (Signed)
Last OV: 03/03/16 Next OV: 08/2016 UDS: none on file, no CSC signed Last zolpidem RX: 02/15/16, #30  Please advise request?

## 2016-03-19 NOTE — Telephone Encounter (Signed)
Ok to send 30 tabs zero refills. 

## 2016-03-20 MED ORDER — ZOLPIDEM TARTRATE 5 MG PO TABS: 5.0000 mg | ORAL_TABLET | Freq: Every evening | ORAL | 0 refills | Status: DC | PRN

## 2016-03-20 MED FILL — ZOLPIDEM TARTRATE 5 MG TAB: 5 | 30 days supply | Qty: 30 | Fill #0

## 2016-03-20 NOTE — Telephone Encounter (Signed)
Message left to confirm pharmacy.         KP

## 2016-03-20 NOTE — Telephone Encounter (Signed)
Called pt. Medication phoned to [place] as requested. Also discussed w/ pt that we will not be doing monthly Chronic Care Management calls this month and will update her again next month. She verbalized understanding and had no further concerns at time of call.

## 2016-03-24 DIAGNOSIS — C186 Malignant neoplasm of descending colon: Secondary | ICD-10-CM | POA: Diagnosis not present

## 2016-04-02 DIAGNOSIS — H04123 Dry eye syndrome of bilateral lacrimal glands: Secondary | ICD-10-CM | POA: Diagnosis not present

## 2016-04-02 DIAGNOSIS — H524 Presbyopia: Secondary | ICD-10-CM | POA: Diagnosis not present

## 2016-04-25 ENCOUNTER — Telehealth: Payer: Self-pay | Admitting: *Deleted

## 2016-04-25 MED ORDER — ZOLPIDEM TARTRATE 5 MG PO TABS: 5.0000 mg | ORAL_TABLET | Freq: Every evening | ORAL | 0 refills | Status: DC | PRN

## 2016-04-25 MED FILL — AZITHROMYCIN 250 MG TABLET: 250 | 5 days supply | Qty: 6 | Fill #0

## 2016-04-25 MED FILL — ZOLPIDEM TARTRATE 5 MG TAB: 5 | 30 days supply | Qty: 30 | Fill #0

## 2016-04-25 NOTE — Telephone Encounter (Signed)
Rx faxed to pharmacy  

## 2016-04-25 NOTE — Telephone Encounter (Signed)
Received fax from [place] for zolpidem 5mg  refills.  Last Rx: 03/20/16, #30 Last OV: 03/03/16 Next OV: 09/03/16  Rx printed and forwarded to PCP for signature.

## 2016-04-30 ENCOUNTER — Encounter: Payer: Self-pay | Admitting: Family

## 2016-04-30 ENCOUNTER — Telehealth: Payer: Self-pay | Admitting: *Deleted

## 2016-04-30 ENCOUNTER — Ambulatory Visit (INDEPENDENT_AMBULATORY_CARE_PROVIDER_SITE_OTHER): Payer: Medicare Other | Admitting: Family

## 2016-04-30 VITALS — BP 150/79 | HR 83 | Temp 98.2°F | Resp 17 | Ht 61.0 in | Wt 191.6 lb

## 2016-04-30 DIAGNOSIS — M069 Rheumatoid arthritis, unspecified: Secondary | ICD-10-CM

## 2016-04-30 DIAGNOSIS — R21 Rash and other nonspecific skin eruption: Secondary | ICD-10-CM

## 2016-04-30 DIAGNOSIS — Z23 Encounter for immunization: Secondary | ICD-10-CM | POA: Diagnosis not present

## 2016-04-30 MED ORDER — BETAMETHASONE DIPROPIONATE 0.05 % EX CREA: TOPICAL_CREAM | Freq: Two times a day (BID) | CUTANEOUS | 0 refills | Status: DC

## 2016-04-30 MED FILL — BETAMETHASONE DP 0.05% CRM: 0.05 | 30 days supply | Qty: 30 | Fill #0

## 2016-04-30 NOTE — Progress Notes (Signed)
Pre visit review using our clinic review tool, if applicable. No additional management support is needed unless otherwise documented below in the visit note. 

## 2016-04-30 NOTE — Progress Notes (Signed)
Subjective:    Patient ID: Sheryl [name], female    DOB: [birth year], 70 y.o.   MRN: [id number]  HPI  Sheryl [name] is a 70 yr old female who presents today with c/o dry hands.  She is concerned that flare up may be due to her not taking RA medication.  She has previously decline biologic agents and instead has been self medicating with cannabis. This historically has really helped her symptoms per patient.   BP Readings from Last 3 Encounters:  04/30/16 (!) 150/79  03/03/16 120/72  12/26/15 (!) 150/80   RA- feels like her joints are inflammed.  Would like referral to a new rheumatologist.   Review of Systems See HPI  Past Medical History:  Diagnosis Date  . Anemia    pernicious  . Aortic ectasia ([facility])    infrarenal abdomina aorta 2.6 cm  . Asthma   . Colon cancer ([place]) 2012  . Diabetes mellitus    type 2  . Fatty liver 01/19/2015  . History of pericarditis 07/26/2012  . Hyperlipidemia   . Pericarditis 07/26/2012  . Rheumatoid arthritis(714.0)   . Sjogren's syndrome ([place]) 03/17/2011  . Small bowel obstruction ([place]) 03/2014   ?due to adhesions from colon surgery per pt  . Thyroid disease    hypothyroid  . Tobacco abuse      Social History   Social History  . Marital status: Single    Spouse name: N/A  . Number of children: 1  . Years of education: N/A   Occupational History  . unemployed Disabled   Social History Main Topics  . Smoking status: Former Smoker    Types: Cigarettes  . Smokeless tobacco: Not on file     Comment: 3 cigarettes a month. Uses smokeless cigarette.  . Alcohol use No     Comment: Has not had alcohol since SBO 03/2014  . Drug use:      Comment: Smokes "medical grade" marijuana 2 puffs 3 x weekly for rheumatoid arthritis.per pt  . Sexual activity: Not on file   Other Topics Concern  . Not on file   Social History Narrative   Regular exercise:  Stretching exercises. Resistance bands   Caffeine: 1 mug (2cus) daily.           Past Surgical History:  Procedure Laterality Date  . COLON SURGERY  8/.23/13   tumor removed from sigmoid colon.  Marland Kitchen DILATION AND CURETTAGE OF UTERUS  1975  . KNEE SURGERY  2006   arthroscopic left knee  . KNEE SURGERY  2001   right knee    Family History  Problem Relation Age of Onset  . Heart disease Other     CAD  . Diabetes Other   . Hyperlipidemia Other   . Stroke Other     Allergies  Allergen Reactions  . Levofloxacin Shortness Of Breath and Rash  . Ativan [Lorazepam]     Visual hallucinations.   . Cefuroxime Axetil     REACTION: asthma, cough  . Hydrocodone-Homatropine Nausea Only  . Rosuvastatin Calcium     Muscle pain, syncope  . Statins     Muscle pain, syncope  . Zetia [Ezetimibe] Diarrhea    Current Outpatient Prescriptions on File Prior to Visit  Medication Sig Dispense Refill  . albuterol (VENTOLIN HFA) 108 (90 Base) MCG/ACT inhaler Inhale 2 puffs into the lungs every 6 (six) hours as needed for wheezing or shortness of breath. 54 g 1  . aspirin 81 MG  tablet Take 81 mg by mouth daily.      . Cyanocobalamin (B-12) 1000 MCG CAPS Take 1,000 mcg by mouth daily.    . diclofenac sodium (VOLTAREN) 1 % GEL Apply 2 g topically 4 (four) times daily as needed. 100 g 2  . fish oil-omega-3 fatty acids 1000 MG capsule Take 2 capsules (2 g total) by mouth 2 (two) times daily.    . fluticasone (FLONASE) 50 MCG/ACT nasal spray Place 2 sprays into the nose daily as needed.    . Fluticasone-Salmeterol (ADVAIR DISKUS) 250-50 MCG/DOSE AEPB Inhale 1 puff into the lungs 2 (two) times daily. 1 each 3  . levothyroxine (SYNTHROID, LEVOTHROID) 100 MCG tablet TAKE 1 TABLET EVERY DAY 90 tablet 1  . montelukast (SINGULAIR) 10 MG tablet TAKE 1 TABLET AT BEDTIME 90 tablet 1  . multivitamin (THERAGRAN) per tablet Take 1 tablet by mouth daily.      . NON FORMULARY TUMERIC MILK.    Marland Kitchen ondansetron (ZOFRAN-ODT) 8 MG disintegrating tablet Take 1 tablet (8 mg total) by mouth every 8  (eight) hours as needed for nausea. 20 tablet 3  . pantoprazole (PROTONIX) 40 MG tablet TAKE 1 TABLET EVERY DAY 90 tablet 1  . Probiotic Product (PROBIOTIC DAILY PO) Take 1 capsule by mouth daily.    Marland Kitchen triamcinolone cream (KENALOG) 0.1 % Apply 1 application topically 2 (two) times daily. 30 g 0  . zolpidem (AMBIEN) 5 MG tablet Take 1 tablet (5 mg total) by mouth at bedtime as needed for sleep. 30 tablet 0  . Zoster Vaccine Live, PF, (ZOSTAVAX) 16109 UNT/0.65ML injection Inject 19,400 Units into the skin once. 1 each 0   Current Facility-Administered Medications on File Prior to Visit  Medication Dose Route Frequency Provider Last Rate Last Dose  . cyanocobalamin ((VITAMIN B-12)) injection 1,000 mcg  1,000 mcg Intramuscular Q30 days [name], NP   1,000 mcg at 08/03/15 1148    BP (!) 150/79 (BP Location: Right Arm, Patient Position: Sitting, Cuff Size: Large)   Pulse 83   Temp 98.2 F (36.8 C) (Oral)   Resp 17   Ht 5\' 1"  (1.549 m)   Wt 191 lb 9.6 oz (86.9 kg)   SpO2 99%   BMI 36.20 kg/m       Objective:   Physical Exam  Constitutional: She is oriented to person, place, and time. She appears well-developed and well-nourished.  Cardiovascular: Normal rate, regular rhythm and normal heart sounds.   No murmur heard. Pulmonary/Chest: Effort normal and breath sounds normal. No respiratory distress. She has no wheezes.  Neurological: She is alert and oriented to person, place, and time.  Skin: Skin is warm and dry.  Bilateral palmar surface is hypertrophic/erythematous with some cracking of skin.  Psychiatric: She has a normal mood and affect. Her behavior is normal. Judgment and thought content normal.          Assessment & Plan:  Skin rash- will give trial of diprolene cream. Refer to dermatology.  ? Psoriasis versus other etiology.  RA- uncontrolled. Requests referral to a new rheumatologist.

## 2016-04-30 NOTE — Patient Instructions (Signed)
Please begin betamethasone cream twice daily to your hands. You will be contacted about your referral to rheumatology and dermatology. Please let me know if your skin becomes more inflammed or if you develop drainage.

## 2016-04-30 NOTE — Telephone Encounter (Signed)
Per verbal from PCP, need to cancel betamethasone Rx to [facility]. Rx cancelled per Ria Comment at [facility] as pt will get it at local pharmacy.

## 2016-05-07 DIAGNOSIS — I10 Essential (primary) hypertension: Secondary | ICD-10-CM | POA: Diagnosis not present

## 2016-05-07 DIAGNOSIS — L308 Other specified dermatitis: Secondary | ICD-10-CM | POA: Diagnosis not present

## 2016-05-07 DIAGNOSIS — E782 Mixed hyperlipidemia: Secondary | ICD-10-CM | POA: Diagnosis not present

## 2016-05-26 ENCOUNTER — Telehealth: Payer: Self-pay | Admitting: Family

## 2016-05-26 MED ORDER — ZOLPIDEM TARTRATE 5 MG PO TABS: 5.0000 mg | ORAL_TABLET | Freq: Every evening | ORAL | 0 refills | Status: DC | PRN

## 2016-05-26 MED FILL — ZOLPIDEM TARTRATE 5 MG TAB: 5 | 30 days supply | Qty: 30 | Fill #0

## 2016-05-26 NOTE — Telephone Encounter (Signed)
Patient needs refill for Ambien sent to [place] call back [phone]

## 2016-05-26 NOTE — Telephone Encounter (Signed)
Rx faxed to pharmacy and pt notified 

## 2016-05-26 NOTE — Telephone Encounter (Signed)
Last Rx:  04/25/16, #30 Next OV: 09/03/16 UDS: not on file and no CSC.  Rx printed and forwarded to PCP for signature.

## 2016-06-04 DIAGNOSIS — D649 Anemia, unspecified: Secondary | ICD-10-CM | POA: Insufficient documentation

## 2016-07-11 ENCOUNTER — Telehealth: Payer: Self-pay

## 2016-07-11 NOTE — Telephone Encounter (Signed)
Pharmacy rx request received via fax for the following medication:  Zolpidem Tartrate 5 mg Tab Last filled:  05/26/16 Amt: 30,0 Last OV: 03/03/16 (Preventative) 04/30/16 (Acute)  No UDS or CSC on file.  Please advise.

## 2016-07-12 NOTE — Telephone Encounter (Signed)
Ok to send 30 tabs zero refills. 

## 2016-07-14 MED ORDER — ZOLPIDEM TARTRATE 5 MG PO TABS: 5.0000 mg | ORAL_TABLET | Freq: Every evening | ORAL | 0 refills | Status: DC | PRN

## 2016-07-14 MED FILL — ZOLPIDEM TARTRATE 5 MG TAB: 5 | 30 days supply | Qty: 30 | Fill #0

## 2016-07-14 NOTE — Telephone Encounter (Signed)
Rx called to [facility] [place].

## 2016-08-04 ENCOUNTER — Other Ambulatory Visit: Payer: Self-pay | Admitting: Family

## 2016-08-11 ENCOUNTER — Telehealth: Payer: Self-pay | Admitting: Family

## 2016-08-11 MED ORDER — ZOLPIDEM TARTRATE 5 MG PO TABS: 5.0000 mg | ORAL_TABLET | Freq: Every evening | ORAL | 0 refills | Status: DC | PRN

## 2016-08-11 NOTE — Telephone Encounter (Signed)
Caller name: CODY SHAVE Relation to [phone] Call back number: [phone] [facility]  Reason for call: Pt came in office stating is needing refill on zolpidem (AMBIEN) 5 MG tablet, pt states is leaving out of town in three days and wants to take her meds with her. Please advise ASAP.

## 2016-08-11 NOTE — Telephone Encounter (Signed)
See rx. 

## 2016-08-11 NOTE — Telephone Encounter (Signed)
Sheryl [name]-- I did not see Rx, do you have it?

## 2016-08-11 NOTE — Telephone Encounter (Signed)
Last zolpidem RX: 07/14/16, #30 Next OV: 09/03/16 UDS: none on file, no CSC.  Please advise?

## 2016-08-11 NOTE — Telephone Encounter (Signed)
Rx faxed to pharmacy. Left message on voicemail re: Rx completion and to call if any questions.

## 2016-08-12 MED FILL — ZOLPIDEM TARTRATE 5 MG TAB: 5 | 30 days supply | Qty: 30 | Fill #0

## 2016-09-03 ENCOUNTER — Ambulatory Visit: Payer: PRIVATE HEALTH INSURANCE | Admitting: Family

## 2016-09-03 ENCOUNTER — Telehealth: Payer: Self-pay | Admitting: Family

## 2016-09-03 NOTE — Telephone Encounter (Signed)
Pt lvm at 8:43 cancelling her appt. Pt says that she has been traveling and is unable to make her appt. Pt says that she tried calling yesterday. I lvm on pt's vm reminding of appt.    Should pt be charged?

## 2016-09-04 NOTE — Telephone Encounter (Signed)
No charge. 

## 2016-09-12 ENCOUNTER — Ambulatory Visit (INDEPENDENT_AMBULATORY_CARE_PROVIDER_SITE_OTHER): Payer: Medicare Other | Admitting: Family

## 2016-09-12 ENCOUNTER — Encounter: Payer: Self-pay | Admitting: Family

## 2016-09-12 VITALS — BP 149/88 | HR 84 | Temp 98.1°F | Resp 18 | Ht 62.75 in | Wt 189.0 lb

## 2016-09-12 DIAGNOSIS — E039 Hypothyroidism, unspecified: Secondary | ICD-10-CM

## 2016-09-12 DIAGNOSIS — R739 Hyperglycemia, unspecified: Secondary | ICD-10-CM

## 2016-09-12 DIAGNOSIS — E119 Type 2 diabetes mellitus without complications: Secondary | ICD-10-CM

## 2016-09-12 DIAGNOSIS — Z1231 Encounter for screening mammogram for malignant neoplasm of breast: Secondary | ICD-10-CM | POA: Diagnosis not present

## 2016-09-12 DIAGNOSIS — E785 Hyperlipidemia, unspecified: Secondary | ICD-10-CM

## 2016-09-12 DIAGNOSIS — J4520 Mild intermittent asthma, uncomplicated: Secondary | ICD-10-CM

## 2016-09-12 DIAGNOSIS — Z23 Encounter for immunization: Secondary | ICD-10-CM

## 2016-09-12 DIAGNOSIS — M069 Rheumatoid arthritis, unspecified: Secondary | ICD-10-CM

## 2016-09-12 DIAGNOSIS — Z1239 Encounter for other screening for malignant neoplasm of breast: Secondary | ICD-10-CM

## 2016-09-12 LAB — LIPID PANEL
CHOL/HDL RATIO: 6
Cholesterol: 187 mg/dL (ref 0–200)
HDL: 31.4 mg/dL — ABNORMAL LOW (ref >39.00)
LDL Cholesterol: 117 mg/dL — ABNORMAL HIGH (ref 0–99)
NonHDL: 155.69
TRIGLYCERIDES: 194 mg/dL — AB (ref 0.0–149.0)
VLDL: 38.8 mg/dL (ref 0.0–40.0)

## 2016-09-12 LAB — BASIC METABOLIC PANEL
BUN: 9 mg/dL (ref 6–23)
CALCIUM: 9 mg/dL (ref 8.4–10.5)
CO2: 28 meq/L (ref 19–32)
CREATININE: 0.8 mg/dL (ref 0.40–1.20)
Chloride: 102 mEq/L (ref 96–112)
GFR: 75.53 mL/min (ref >60.00)
Glucose, Bld: 118 mg/dL — ABNORMAL HIGH (ref 70–99)
Potassium: 4.3 mEq/L (ref 3.5–5.1)
Sodium: 137 mEq/L (ref 135–145)

## 2016-09-12 LAB — TSH: TSH: 1.69 u[IU]/mL (ref 0.35–4.50)

## 2016-09-12 LAB — HEMOGLOBIN A1C: HEMOGLOBIN A1C: 6.3 % (ref 4.6–6.5)

## 2016-09-12 MED ORDER — FLUTICASONE-SALMETEROL 250-50 MCG/DOSE IN AEPB: 1.0000 | INHALATION_SPRAY | Freq: Two times a day (BID) | RESPIRATORY_TRACT | 3 refills | Status: DC

## 2016-09-12 MED ORDER — FLUTICASONE PROPIONATE 50 MCG/ACT NA SUSP: 2.0000 | Freq: Every day | NASAL | 5 refills | Status: DC | PRN

## 2016-09-12 MED ORDER — DICLOFENAC SODIUM 1 % TD GEL: 2.0000 g | Freq: Four times a day (QID) | TRANSDERMAL | 2 refills | Status: DC | PRN

## 2016-09-12 MED ORDER — ZOLPIDEM TARTRATE 5 MG PO TABS: 5.0000 mg | ORAL_TABLET | Freq: Every evening | ORAL | 0 refills | Status: DC | PRN

## 2016-09-12 MED FILL — VOLTAREN 1% GEL: 1 | 13 days supply | Qty: 100 | Fill #0

## 2016-09-12 MED FILL — FLUTICASONE PROP 50 MCG SPR: 50 | 30 days supply | Qty: 16 | Fill #0

## 2016-09-12 MED FILL — ADVAIR 250/50 DISKUS: 250-50 | 30 days supply | Qty: 60 | Fill #0

## 2016-09-12 MED FILL — ZOLPIDEM TARTRATE 5 MG TAB: 5 | 30 days supply | Qty: 30 | Fill #0

## 2016-09-12 NOTE — Progress Notes (Signed)
Subjective:    Patient ID: Sheryl [name], female    DOB: [birth year], 71 y.o.   MRN: [id number]  HPI  Sheryl [name] is a 71 yr old female who presents today for follow up.  Hypothyroid- maintained on synthroid 147mcg. Lab Results  Component Value Date   TSH 1.25 12/26/2015   Hyperlipidemia- not maintained on statin due to intolerance to statins and zetia.  Lab Results  Component Value Date   CHOL 206 (H) 01/08/2015   HDL 34.20 (L) 01/08/2015   LDLCALC 136 (H) 01/08/2015   TRIG 177.0 (H) 01/08/2015   CHOLHDL 6 01/08/2015   Asthma-  Maintained on advair 250/50. Recently her symptoms have been worse due to the cold air. Reports symptoms OK today.    RA- reports some chronic pain in her hands or knee pain/ankle pain which bothers her after she goes shopping.  Reports that she is using a  Cannabis supplement which she purchases in [place].  Notes that it has BCB which is the "anti-inflammatory."  Notes a mild relief.   She is not currently seeing a rheumatology. She did see Dr. Gerilyn Nestle.  She declines referral to rheumatology at this time because she declines conventional therapy for her RA.     Review of Systems    see HPI  Past Medical History:  Diagnosis Date  . Anemia    pernicious  . Aortic ectasia (HCC)    infrarenal abdomina aorta 2.6 cm  . Asthma   . Colon cancer ([place]) 2012  . Diabetes mellitus    type 2  . Fatty liver 01/19/2015  . History of pericarditis 07/26/2012  . Hyperlipidemia   . Pericarditis 07/26/2012  . Rheumatoid arthritis(714.0)   . Sjogren's syndrome ([place]) 03/17/2011  . Small bowel obstruction 03/2014   ?due to adhesions from colon surgery per pt  . Thyroid disease    hypothyroid  . Tobacco abuse      Social History   Social History  . Marital status: Single    Spouse name: N/A  . Number of children: 1  . Years of education: N/A   Occupational History  . unemployed Disabled   Social History Main Topics  . Smoking status:  Former Smoker    Types: Cigarettes  . Smokeless tobacco: Never Used     Comment: 3 cigarettes a month. Uses smokeless cigarette.  . Alcohol use No     Comment: Has not had alcohol since SBO 03/2014  . Drug use: Yes     Comment: Smokes "medical grade" marijuana 2 puffs 3 x weekly for rheumatoid arthritis.per pt  . Sexual activity: Not on file   Other Topics Concern  . Not on file   Social History Narrative   Regular exercise:  Stretching exercises. Resistance bands   Caffeine: 1 mug (2cus) daily.          Past Surgical History:  Procedure Laterality Date  . COLON SURGERY  8/.23/13   tumor removed from sigmoid colon.  Marland Kitchen DILATION AND CURETTAGE OF UTERUS  1975  . KNEE SURGERY  2006   arthroscopic left knee  . KNEE SURGERY  2001   right knee    Family History  Problem Relation Age of Onset  . Heart disease Other     CAD  . Diabetes Other   . Hyperlipidemia Other   . Stroke Other     Allergies  Allergen Reactions  . Levofloxacin Shortness Of Breath and Rash  . Ativan [Lorazepam]  Visual hallucinations.  Visual hallucinations.   . Cefuroxime Axetil     REACTION: asthma, cough  . Hydrocodone-Homatropine Nausea Only  . Rosuvastatin Calcium     Muscle pain, syncope  . Statins     Muscle pain, syncope  . Zetia [Ezetimibe] Diarrhea    Current Outpatient Prescriptions on File Prior to Visit  Medication Sig Dispense Refill  . albuterol (VENTOLIN HFA) 108 (90 Base) MCG/ACT inhaler Inhale 2 puffs into the lungs every 6 (six) hours as needed for wheezing or shortness of breath. 54 g 1  . aspirin 81 MG tablet Take 81 mg by mouth daily.      . betamethasone dipropionate (DIPROLENE) 0.05 % cream Apply topically 2 (two) times daily. 30 g 0  . Cyanocobalamin (B-12) 1000 MCG CAPS Take 1,000 mcg by mouth daily.    . fish oil-omega-3 fatty acids 1000 MG capsule Take 2 capsules (2 g total) by mouth 2 (two) times daily.    Marland Kitchen levothyroxine (SYNTHROID, LEVOTHROID) 100 MCG tablet  TAKE 1 TABLET EVERY DAY 90 tablet 1  . montelukast (SINGULAIR) 10 MG tablet TAKE 1 TABLET AT BEDTIME 90 tablet 1  . multivitamin (THERAGRAN) per tablet Take 1 tablet by mouth daily.      . NON FORMULARY TUMERIC MILK.    Marland Kitchen ondansetron (ZOFRAN-ODT) 8 MG disintegrating tablet Take 1 tablet (8 mg total) by mouth every 8 (eight) hours as needed for nausea. 20 tablet 3  . pantoprazole (PROTONIX) 40 MG tablet TAKE 1 TABLET EVERY DAY 90 tablet 1  . Probiotic Product (PROBIOTIC DAILY PO) Take 1 capsule by mouth daily.    Marland Kitchen triamcinolone cream (KENALOG) 0.1 % Apply 1 application topically 2 (two) times daily. 30 g 0  . Zoster Vaccine Live, PF, (ZOSTAVAX) 96295 UNT/0.65ML injection Inject 19,400 Units into the skin once. (Patient not taking: Reported on 09/12/2016) 1 each 0   No current facility-administered medications on file prior to visit.     BP (!) 149/88 (BP Location: Right Arm, Cuff Size: Large)   Pulse 84   Temp 98.1 F (36.7 C) (Oral)   Resp 18   Ht 5' 2.75" (1.594 m)   Wt 189 lb (85.7 kg)   SpO2 100% Comment: room air  BMI 33.75 kg/m    Objective:   Physical Exam  Constitutional: She is oriented to person, place, and time. She appears well-developed and well-nourished.  Cardiovascular: Normal rate, regular rhythm and normal heart sounds.   No murmur heard. Pulmonary/Chest: Effort normal and breath sounds normal. No respiratory distress. She has no wheezes.  Neurological: She is alert and oriented to person, place, and time.  Psychiatric: She has a normal mood and affect. Her behavior is normal. Judgment and thought content normal.          Assessment & Plan:

## 2016-09-12 NOTE — Progress Notes (Signed)
Pre visit review using our clinic review tool, if applicable. No additional management support is needed unless otherwise documented below in the visit note. 

## 2016-09-12 NOTE — Assessment & Plan Note (Signed)
Has been working on weight loss, obtain follow up A1c.

## 2016-09-12 NOTE — Assessment & Plan Note (Signed)
Stable, declines rheumatology evaluation.

## 2016-09-12 NOTE — Assessment & Plan Note (Signed)
Clinically stable on synthroid, continue same.  

## 2016-09-12 NOTE — Assessment & Plan Note (Signed)
Fair control, continue advair. PRN albuterol.

## 2016-09-12 NOTE — Assessment & Plan Note (Signed)
>>  ASSESSMENT AND PLAN FOR HYPERLIPIDEMIA WRITTEN ON 09/12/2016 11:40 AM BY O'SULLIVAN, Lorell Thibodaux, NP  Obtain follow up lipid panel.

## 2016-09-12 NOTE — Assessment & Plan Note (Signed)
Obtain follow up lipid panel.

## 2016-09-14 ENCOUNTER — Encounter: Payer: Self-pay | Admitting: Family

## 2016-09-22 ENCOUNTER — Telehealth: Payer: Self-pay | Admitting: *Deleted

## 2016-09-22 MED ORDER — FLUTICASONE-SALMETEROL 250-50 MCG/DOSE IN AEPB: 1.0000 | INHALATION_SPRAY | Freq: Two times a day (BID) | RESPIRATORY_TRACT | 1 refills | Status: DC

## 2016-09-22 NOTE — Telephone Encounter (Signed)
Received fax from [facility] mail order requesting rx for advair 250-51mcg. Rx sent.

## 2016-09-23 ENCOUNTER — Ambulatory Visit (HOSPITAL_BASED_OUTPATIENT_CLINIC_OR_DEPARTMENT_OTHER): Payer: Medicare Other

## 2016-09-24 DIAGNOSIS — R7989 Other specified abnormal findings of blood chemistry: Secondary | ICD-10-CM | POA: Diagnosis not present

## 2016-09-24 DIAGNOSIS — F4322 Adjustment disorder with anxiety: Secondary | ICD-10-CM | POA: Diagnosis not present

## 2016-09-24 DIAGNOSIS — C187 Malignant neoplasm of sigmoid colon: Secondary | ICD-10-CM | POA: Diagnosis not present

## 2016-09-24 DIAGNOSIS — I319 Disease of pericardium, unspecified: Secondary | ICD-10-CM | POA: Diagnosis not present

## 2016-09-24 DIAGNOSIS — E785 Hyperlipidemia, unspecified: Secondary | ICD-10-CM | POA: Diagnosis not present

## 2016-09-24 DIAGNOSIS — Z7951 Long term (current) use of inhaled steroids: Secondary | ICD-10-CM | POA: Diagnosis not present

## 2016-09-24 DIAGNOSIS — K5900 Constipation, unspecified: Secondary | ICD-10-CM | POA: Diagnosis not present

## 2016-09-24 DIAGNOSIS — K921 Melena: Secondary | ICD-10-CM | POA: Diagnosis not present

## 2016-09-24 DIAGNOSIS — Z7982 Long term (current) use of aspirin: Secondary | ICD-10-CM | POA: Diagnosis not present

## 2016-09-24 DIAGNOSIS — E039 Hypothyroidism, unspecified: Secondary | ICD-10-CM | POA: Diagnosis not present

## 2016-09-24 DIAGNOSIS — Z8709 Personal history of other diseases of the respiratory system: Secondary | ICD-10-CM | POA: Diagnosis not present

## 2016-09-24 DIAGNOSIS — Z9049 Acquired absence of other specified parts of digestive tract: Secondary | ICD-10-CM | POA: Diagnosis not present

## 2016-09-24 DIAGNOSIS — Z79899 Other long term (current) drug therapy: Secondary | ICD-10-CM | POA: Diagnosis not present

## 2016-09-24 DIAGNOSIS — J90 Pleural effusion, not elsewhere classified: Secondary | ICD-10-CM | POA: Diagnosis not present

## 2016-09-24 DIAGNOSIS — M069 Rheumatoid arthritis, unspecified: Secondary | ICD-10-CM | POA: Diagnosis not present

## 2016-09-24 DIAGNOSIS — C186 Malignant neoplasm of descending colon: Secondary | ICD-10-CM | POA: Diagnosis not present

## 2016-09-24 DIAGNOSIS — Z885 Allergy status to narcotic agent status: Secondary | ICD-10-CM | POA: Diagnosis not present

## 2016-09-24 DIAGNOSIS — E119 Type 2 diabetes mellitus without complications: Secondary | ICD-10-CM | POA: Diagnosis not present

## 2016-09-24 DIAGNOSIS — K219 Gastro-esophageal reflux disease without esophagitis: Secondary | ICD-10-CM | POA: Diagnosis not present

## 2016-09-24 DIAGNOSIS — Z862 Personal history of diseases of the blood and blood-forming organs and certain disorders involving the immune mechanism: Secondary | ICD-10-CM | POA: Diagnosis not present

## 2016-09-24 DIAGNOSIS — D696 Thrombocytopenia, unspecified: Secondary | ICD-10-CM | POA: Diagnosis not present

## 2016-09-24 DIAGNOSIS — F17200 Nicotine dependence, unspecified, uncomplicated: Secondary | ICD-10-CM | POA: Diagnosis not present

## 2016-09-24 DIAGNOSIS — L4050 Arthropathic psoriasis, unspecified: Secondary | ICD-10-CM | POA: Diagnosis not present

## 2016-09-24 DIAGNOSIS — Z888 Allergy status to other drugs, medicaments and biological substances status: Secondary | ICD-10-CM | POA: Diagnosis not present

## 2016-09-24 DIAGNOSIS — J45909 Unspecified asthma, uncomplicated: Secondary | ICD-10-CM | POA: Diagnosis not present

## 2016-09-30 ENCOUNTER — Encounter (HOSPITAL_BASED_OUTPATIENT_CLINIC_OR_DEPARTMENT_OTHER): Payer: Self-pay

## 2016-09-30 ENCOUNTER — Ambulatory Visit (HOSPITAL_BASED_OUTPATIENT_CLINIC_OR_DEPARTMENT_OTHER)
Admission: RE | Admit: 2016-09-30 | Discharge: 2016-09-30 | Disposition: A | Discharge status: Home / Self Care | Payer: Medicare Other | Source: Ambulatory Visit | Attending: Family | Admitting: Family

## 2016-09-30 DIAGNOSIS — Z1231 Encounter for screening mammogram for malignant neoplasm of breast: Secondary | ICD-10-CM | POA: Diagnosis not present

## 2016-09-30 DIAGNOSIS — Z1239 Encounter for other screening for malignant neoplasm of breast: Secondary | ICD-10-CM

## 2016-10-14 ENCOUNTER — Ambulatory Visit (INDEPENDENT_AMBULATORY_CARE_PROVIDER_SITE_OTHER): Payer: Medicare Other | Admitting: Family

## 2016-10-14 ENCOUNTER — Encounter: Payer: Self-pay | Admitting: Family

## 2016-10-14 VITALS — BP 138/72 | HR 85 | Temp 98.1°F | Ht 62.0 in | Wt 185.4 lb

## 2016-10-14 DIAGNOSIS — R1011 Right upper quadrant pain: Secondary | ICD-10-CM | POA: Diagnosis not present

## 2016-10-14 DIAGNOSIS — E039 Hypothyroidism, unspecified: Secondary | ICD-10-CM | POA: Diagnosis not present

## 2016-10-14 LAB — CBC WITH DIFFERENTIAL/PLATELET
BASOS PCT: 1.1 % (ref 0.0–3.0)
Basophils Absolute: 0.1 10*3/uL (ref 0.0–0.1)
EOS PCT: 3.6 % (ref 0.0–5.0)
Eosinophils Absolute: 0.2 10*3/uL (ref 0.0–0.7)
HEMATOCRIT: 45.8 % (ref 36.0–46.0)
HEMOGLOBIN: 15.4 g/dL — AB (ref 12.0–15.0)
LYMPHS PCT: 30.4 % (ref 12.0–46.0)
Lymphs Abs: 2 10*3/uL (ref 0.7–4.0)
MCHC: 33.6 g/dL (ref 30.0–36.0)
MCV: 91.4 fl (ref 78.0–100.0)
MONOS PCT: 6.6 % (ref 3.0–12.0)
Monocytes Absolute: 0.4 10*3/uL (ref 0.1–1.0)
NEUTROS ABS: 3.8 10*3/uL (ref 1.4–7.7)
Neutrophils Relative %: 58.3 % (ref 43.0–77.0)
PLATELETS: 157 10*3/uL (ref 150.0–400.0)
RBC: 5.01 Mil/uL (ref 3.87–5.11)
RDW: 14.7 % (ref 11.5–15.5)
WBC: 6.5 10*3/uL (ref 4.0–10.5)

## 2016-10-14 LAB — TSH: TSH: 1.05 u[IU]/mL (ref 0.35–4.50)

## 2016-10-14 LAB — COMPREHENSIVE METABOLIC PANEL
ALBUMIN: 3.8 g/dL (ref 3.5–5.2)
ALT: 54 U/L — ABNORMAL HIGH (ref 0–35)
AST: 63 U/L — AB (ref 0–37)
Alkaline Phosphatase: 73 U/L (ref 39–117)
BUN: 16 mg/dL (ref 6–23)
CALCIUM: 9 mg/dL (ref 8.4–10.5)
CO2: 25 meq/L (ref 19–32)
Chloride: 104 mEq/L (ref 96–112)
Creatinine, Ser: 0.86 mg/dL (ref 0.40–1.20)
GFR: 69.47 mL/min (ref >60.00)
Glucose, Bld: 132 mg/dL — ABNORMAL HIGH (ref 70–99)
POTASSIUM: 4.4 meq/L (ref 3.5–5.1)
SODIUM: 134 meq/L — AB (ref 135–145)
Total Bilirubin: 0.3 mg/dL (ref 0.2–1.2)
Total Protein: 8 g/dL (ref 6.0–8.3)

## 2016-10-14 NOTE — Progress Notes (Signed)
Subjective:    Patient ID: Sheryl [name], female    DOB: 1945-09-12, 71 y.o.   MRN: [id number]  HPI  Ms. Glacken is a 71 yr old female with known hx of cholelithiasis who presents today with chief complaint of nausea.  Reports that these symptoms began about 10 days ago.  She reports that she then developed some heartburn pain.  Has some soreness under her right rib cage.  She reports some "back ache" across her back for the last few days.  She reports that greasy food seems to makes her symptoms worse.     Review of Systems See HPI  Past Medical History:  Diagnosis Date  . Anemia    pernicious  . Aortic ectasia (HCC)    infrarenal abdomina aorta 2.6 cm  . Asthma   . Colon cancer ([place]) 2012  . Diabetes mellitus    type 2  . Fatty liver 01/19/2015  . History of pericarditis 07/26/2012  . Hyperlipidemia   . Pericarditis 07/26/2012  . Rheumatoid arthritis(714.0)   . Sjogren's syndrome ([place]) 03/17/2011  . Small bowel obstruction 03/2014   ?due to adhesions from colon surgery per pt  . Thyroid disease    hypothyroid  . Tobacco abuse      Social History   Social History  . Marital status: Single    Spouse name: N/A  . Number of children: 1  . Years of education: N/A   Occupational History  . unemployed Disabled   Social History Main Topics  . Smoking status: Former Smoker    Types: Cigarettes  . Smokeless tobacco: Never Used     Comment: 3 cigarettes a month. Uses smokeless cigarette.  . Alcohol use No     Comment: Has not had alcohol since SBO 03/2014  . Drug use: Yes     Comment: Smokes "medical grade" marijuana 2 puffs 3 x weekly for rheumatoid arthritis.per pt  . Sexual activity: Not on file   Other Topics Concern  . Not on file   Social History Narrative   Regular exercise:  Stretching exercises. Resistance bands   Caffeine: 1 mug (2cus) daily.          Past Surgical History:  Procedure Laterality Date  . COLON SURGERY  8/.23/13   tumor  removed from sigmoid colon.  Marland Kitchen DILATION AND CURETTAGE OF UTERUS  1975  . KNEE SURGERY  2006   arthroscopic left knee  . KNEE SURGERY  2001   right knee    Family History  Problem Relation Age of Onset  . Heart disease Other     CAD  . Diabetes Other   . Hyperlipidemia Other   . Stroke Other     Allergies  Allergen Reactions  . Levofloxacin Shortness Of Breath and Rash  . Ativan [Lorazepam]     Visual hallucinations.  Visual hallucinations.   . Cefuroxime Axetil     REACTION: asthma, cough  . Hydrocodone-Homatropine Nausea Only  . Rosuvastatin Calcium     Muscle pain, syncope  . Statins     Muscle pain, syncope  . Zetia [Ezetimibe] Diarrhea  . Hydrocodone-Acetaminophen Nausea Only    Current Outpatient Prescriptions on File Prior to Visit  Medication Sig Dispense Refill  . albuterol (VENTOLIN HFA) 108 (90 Base) MCG/ACT inhaler Inhale 2 puffs into the lungs every 6 (six) hours as needed for wheezing or shortness of breath. 54 g 1  . aspirin 81 MG tablet Take 81 mg by mouth daily.      Marland Kitchen  betamethasone dipropionate (DIPROLENE) 0.05 % cream Apply topically 2 (two) times daily. 30 g 0  . Cyanocobalamin (B-12) 1000 MCG CAPS Take 1,000 mcg by mouth daily.    . diclofenac sodium (VOLTAREN) 1 % GEL Apply 2 g topically 4 (four) times daily as needed. 100 g 2  . fish oil-omega-3 fatty acids 1000 MG capsule Take 2 capsules (2 g total) by mouth 2 (two) times daily.    . fluticasone (FLONASE) 50 MCG/ACT nasal spray Place 2 sprays into both nostrils daily as needed. 9.9 g 5  . Fluticasone-Salmeterol (ADVAIR DISKUS) 250-50 MCG/DOSE AEPB Inhale 1 puff into the lungs 2 (two) times daily. 3 each 1  . levothyroxine (SYNTHROID, LEVOTHROID) 100 MCG tablet TAKE 1 TABLET EVERY DAY 90 tablet 1  . montelukast (SINGULAIR) 10 MG tablet TAKE 1 TABLET AT BEDTIME 90 tablet 1  . multivitamin (THERAGRAN) per tablet Take 1 tablet by mouth daily.      . NON FORMULARY TUMERIC MILK.    . NON FORMULARY ?  BCB. Derivative of marijuana without the marijuana.    . ondansetron (ZOFRAN-ODT) 8 MG disintegrating tablet Take 1 tablet (8 mg total) by mouth every 8 (eight) hours as needed for nausea. 20 tablet 3  . pantoprazole (PROTONIX) 40 MG tablet TAKE 1 TABLET EVERY DAY 90 tablet 1  . Probiotic Product (PROBIOTIC DAILY PO) Take 1 capsule by mouth daily.    Marland Kitchen triamcinolone cream (KENALOG) 0.1 % Apply 1 application topically 2 (two) times daily. 30 g 0  . zolpidem (AMBIEN) 5 MG tablet Take 1 tablet (5 mg total) by mouth at bedtime as needed for sleep. 30 tablet 0  . Zoster Vaccine Live, PF, (ZOSTAVAX) 69629 UNT/0.65ML injection Inject 19,400 Units into the skin once. 1 each 0   No current facility-administered medications on file prior to visit.     BP 138/72 (BP Location: Left Arm, Patient Position: Sitting, Cuff Size: Large)   Pulse 85   Temp 98.1 F (36.7 C) (Oral)   Ht 5\' 2"  (1.575 m)   Wt 185 lb 6.4 oz (84.1 kg)   SpO2 95%   BMI 33.91 kg/m       Objective:   Physical Exam  Constitutional: She is oriented to person, place, and time. She appears well-developed and well-nourished.  HENT:  Head: Normocephalic and atraumatic.  Cardiovascular: Normal rate, regular rhythm and normal heart sounds.   No murmur heard. Pulmonary/Chest: Effort normal and breath sounds normal. No respiratory distress. She has no wheezes.  Abdominal: Soft. Bowel sounds are normal. She exhibits no mass. There is no rebound and no guarding.  Mild epigastric and RUQ tenderness  Musculoskeletal: She exhibits no edema.  Neurological: She is alert and oriented to person, place, and time.  Psychiatric: She has a normal mood and affect. Her behavior is normal. Judgment and thought content normal.          Assessment & Plan:  RUQ pain- New. hx of cholelithiasis. Obtain cbc and cmet, repeat abdominal US to assess GB. Refer to general surgeon for discussion re: elective cholecystectomy.   Hypothyroid- due for follow  up TSH, will obtain. Continue synthroid.

## 2016-10-14 NOTE — Patient Instructions (Addendum)
Please complete lab work prior to leaving.  Schedule your ultrasound on the first floor. You will be contacted about your referral to the surgeon.

## 2016-10-14 NOTE — Progress Notes (Signed)
Pre visit review using our clinic review tool, if applicable. No additional management support is needed unless otherwise documented below in the visit note. 

## 2016-10-15 ENCOUNTER — Telehealth: Payer: Self-pay | Admitting: Family

## 2016-10-15 ENCOUNTER — Ambulatory Visit (HOSPITAL_BASED_OUTPATIENT_CLINIC_OR_DEPARTMENT_OTHER)
Admission: RE | Admit: 2016-10-15 | Discharge: 2016-10-15 | Disposition: A | Discharge status: Home / Self Care | Payer: Medicare Other | Source: Ambulatory Visit | Attending: Family | Admitting: Family

## 2016-10-15 DIAGNOSIS — D582 Other hemoglobinopathies: Secondary | ICD-10-CM

## 2016-10-15 DIAGNOSIS — R1011 Right upper quadrant pain: Secondary | ICD-10-CM | POA: Diagnosis not present

## 2016-10-15 DIAGNOSIS — K8020 Calculus of gallbladder without cholecystitis without obstruction: Secondary | ICD-10-CM | POA: Diagnosis not present

## 2016-10-15 DIAGNOSIS — I77811 Abdominal aortic ectasia: Secondary | ICD-10-CM | POA: Insufficient documentation

## 2016-10-15 NOTE — Telephone Encounter (Signed)
Attempted to reach pt and line is busy. 

## 2016-10-15 NOTE — Telephone Encounter (Signed)
LFT mildly elevated.  Likely secondary to fatty liver.  Work on low fat/low cholesterol diet, exercise and weight loss.  Sodium was mildly low.  Hemoglobin mildly elevated.  I would like her to complete the pended tests please.

## 2016-10-20 ENCOUNTER — Other Ambulatory Visit: Payer: Self-pay | Admitting: *Deleted

## 2016-10-20 ENCOUNTER — Telehealth: Payer: Self-pay | Admitting: Family

## 2016-10-20 ENCOUNTER — Other Ambulatory Visit (INDEPENDENT_AMBULATORY_CARE_PROVIDER_SITE_OTHER): Payer: Medicare Other

## 2016-10-20 DIAGNOSIS — D582 Other hemoglobinopathies: Secondary | ICD-10-CM | POA: Diagnosis not present

## 2016-10-20 LAB — IRON: Iron: 77 ug/dL (ref 42–145)

## 2016-10-20 LAB — FERRITIN: Ferritin: 68.1 ng/mL (ref 10.0–291.0)

## 2016-10-20 MED ORDER — ZOLPIDEM TARTRATE 5 MG PO TABS: 5.0000 mg | ORAL_TABLET | Freq: Every evening | ORAL | 0 refills | Status: DC | PRN

## 2016-10-20 MED FILL — ZOLPIDEM TARTRATE 5 MG TAB: 5 | 30 days supply | Qty: 30 | Fill #0

## 2016-10-20 NOTE — Telephone Encounter (Signed)
Notified pt and she voices understanding. States liver enzymes have been elevated since she was placed on a biologic drug and prednisone for her rheumatoid arthritis years ago. Has appt with Dr Kathie Dike tomorrow. Lab appt scheduled for today at 1:30pm and lab orders signed.

## 2016-10-20 NOTE — Telephone Encounter (Signed)
Received fax from [facility] requesting refill of zolpidem tartrate 5mg .   Last Rx: 09/12/16, #30 Last OV:  10/14/16 Next OV: 12/19/16  Rx printed and forwarded to PCP for signature.

## 2016-10-20 NOTE — Telephone Encounter (Signed)
Pt would like to come in and pick up her ultra sound disk and notes for her visit with the specialist. Pt says that she is going to have to have her gallbladder removed. Transferred pt downstairs to request her images for her visit with the specialist.

## 2016-10-21 DIAGNOSIS — K439 Ventral hernia without obstruction or gangrene: Secondary | ICD-10-CM | POA: Diagnosis not present

## 2016-10-21 DIAGNOSIS — K8020 Calculus of gallbladder without cholecystitis without obstruction: Secondary | ICD-10-CM | POA: Diagnosis not present

## 2016-10-22 NOTE — Telephone Encounter (Signed)
Verified pharmacy received fax for  below rx.

## 2016-10-23 ENCOUNTER — Encounter: Payer: Self-pay | Admitting: Family

## 2016-10-23 HISTORY — PX: CHOLECYSTECTOMY: SHX55

## 2016-10-23 HISTORY — PX: HERNIA REPAIR: SHX51

## 2016-10-24 DIAGNOSIS — Z01812 Encounter for preprocedural laboratory examination: Secondary | ICD-10-CM | POA: Diagnosis not present

## 2016-10-27 ENCOUNTER — Encounter: Payer: Self-pay | Admitting: Family

## 2016-10-27 DIAGNOSIS — Z148 Genetic carrier of other disease: Secondary | ICD-10-CM

## 2016-10-27 HISTORY — DX: Genetic carrier of other disease: Z14.8

## 2016-10-27 LAB — HEMOCHROMATOSIS DNA-PCR(C282Y,H63D)

## 2016-10-28 ENCOUNTER — Telehealth: Payer: Self-pay

## 2016-10-28 NOTE — Telephone Encounter (Signed)
Tried to reach pt lvm to call back.  

## 2016-10-28 NOTE — Telephone Encounter (Signed)
Notified pt and she voices understanding. Wants to let PCP know that she will see Dr Kathie Dike tomorrow for her gallbladder removal and hernia repair.

## 2016-10-28 NOTE — Telephone Encounter (Signed)
-----   Message from [name], NP sent at 10/27/2016  9:11 PM EST ----- Please let patient know that I did some testing and is shows that she has one copy of a gene mutation that can affect her iron absorption.  I would advise her to avoid iron supplements or vitamins which contain iron.

## 2016-10-29 DIAGNOSIS — K439 Ventral hernia without obstruction or gangrene: Secondary | ICD-10-CM | POA: Diagnosis not present

## 2016-10-29 DIAGNOSIS — K8020 Calculus of gallbladder without cholecystitis without obstruction: Secondary | ICD-10-CM | POA: Diagnosis not present

## 2016-10-29 DIAGNOSIS — K8010 Calculus of gallbladder with chronic cholecystitis without obstruction: Secondary | ICD-10-CM | POA: Diagnosis not present

## 2016-10-30 DIAGNOSIS — Z87891 Personal history of nicotine dependence: Secondary | ICD-10-CM | POA: Diagnosis not present

## 2016-10-30 DIAGNOSIS — K8000 Calculus of gallbladder with acute cholecystitis without obstruction: Secondary | ICD-10-CM | POA: Diagnosis not present

## 2016-10-30 DIAGNOSIS — Z79899 Other long term (current) drug therapy: Secondary | ICD-10-CM | POA: Diagnosis not present

## 2016-10-30 DIAGNOSIS — E785 Hyperlipidemia, unspecified: Secondary | ICD-10-CM | POA: Diagnosis present

## 2016-10-30 DIAGNOSIS — E119 Type 2 diabetes mellitus without complications: Secondary | ICD-10-CM | POA: Diagnosis present

## 2016-10-30 DIAGNOSIS — J45909 Unspecified asthma, uncomplicated: Secondary | ICD-10-CM | POA: Diagnosis present

## 2016-10-30 DIAGNOSIS — K439 Ventral hernia without obstruction or gangrene: Secondary | ICD-10-CM | POA: Diagnosis present

## 2016-10-30 DIAGNOSIS — Z7982 Long term (current) use of aspirin: Secondary | ICD-10-CM | POA: Diagnosis not present

## 2016-10-30 DIAGNOSIS — Z85038 Personal history of other malignant neoplasm of large intestine: Secondary | ICD-10-CM | POA: Diagnosis not present

## 2016-10-30 DIAGNOSIS — R0602 Shortness of breath: Secondary | ICD-10-CM | POA: Diagnosis not present

## 2016-10-30 DIAGNOSIS — K219 Gastro-esophageal reflux disease without esophagitis: Secondary | ICD-10-CM | POA: Diagnosis present

## 2016-10-30 DIAGNOSIS — M1990 Unspecified osteoarthritis, unspecified site: Secondary | ICD-10-CM | POA: Diagnosis present

## 2016-10-30 DIAGNOSIS — K8010 Calculus of gallbladder with chronic cholecystitis without obstruction: Secondary | ICD-10-CM | POA: Diagnosis present

## 2016-10-31 MED FILL — HYDROCODON-APAP 5-325: 5-325 | 4 days supply | Qty: 25 | Fill #0

## 2016-11-05 ENCOUNTER — Encounter: Payer: Self-pay | Admitting: Family

## 2016-11-05 NOTE — Telephone Encounter (Signed)
Start zpak, start prednisone as follows:  Prednisone 40mg  once daily x 2 days, then 30mg  once daily for 2 days, then 20mg  once daily x 2 days, then 10mg  once daily x 2 days.     continue nebulizer every 4-6 hours. If fever >101, increased sob should go to the ER. Otherwise I would like to see her in the office if not improved by Friday AM.

## 2016-11-05 NOTE — Telephone Encounter (Signed)
error:[id number] ° °

## 2016-11-05 NOTE — Telephone Encounter (Signed)
Received call from pt stating she had surgery last Wednesday (cholecystecomty / hernia repair); has drain tube.States hernia repair was more complicated and pt had to stay in the hospital 2 days.  Gave pt hydrocodone which helped her pain but states it aggravated her asthma. She has stopped hydrocodone and woke up yesterday "in a full blown asthma attack".  Took breathing treatment. Using nebulizer every 3-4 hours. Doesn't feel like inhalers are working well. Feels very congested and wheezy. Cough is productive with thick, milky white sputum.  Feels like she has had a fever (chills) but hasn't been able to check her temperature. Has been taking alka seltzer plus cold and flu and reports this is easing some of her aching. States she has a zpack 250mg , 6 tablets on hand for dental procedures and prednisone 10mg  (#30) on hand and wants to know if she should start taking these. If so, how should she take the prednisone? Pt states she is still weak from surgery, has drain tube and multiple stitches and doesn't feel like she can come in to the office for evaluation. Please advise?

## 2016-11-05 NOTE — Telephone Encounter (Signed)
Notified pt and scheduled tentative appt for Friday at 1:45pm. Pt states she is not able to drive at this time and will have to arrange transportation. She is also scheduled to have her staples removed on Friday morning at 9:50. She will call to cancel appt if she is feeling better on Friday.

## 2016-11-07 ENCOUNTER — Ambulatory Visit: Payer: Medicare Other | Admitting: Family

## 2016-11-07 ENCOUNTER — Telehealth: Payer: Self-pay | Admitting: Family

## 2016-11-07 NOTE — Telephone Encounter (Signed)
Pt lvm at 12:42 to cancel her appt. Pt says that shes not feeling to well. Pt says that she will call back in to reschedule her appt.

## 2016-11-07 NOTE — Telephone Encounter (Signed)
Ok, no charge please.

## 2016-11-11 DIAGNOSIS — R05 Cough: Secondary | ICD-10-CM | POA: Diagnosis not present

## 2016-11-12 ENCOUNTER — Telehealth: Payer: Self-pay | Admitting: Family

## 2016-11-12 ENCOUNTER — Ambulatory Visit: Payer: Medicare Other | Admitting: Family

## 2016-11-12 NOTE — Telephone Encounter (Signed)
Pt called in at 9:00a. To cancel her appt. Pt says that she don't want to come out in this weather considering that she is already sick. Pt rescheduled her appt for Friday with provider.

## 2016-11-12 NOTE — Telephone Encounter (Signed)
Noted. No charge please.

## 2016-11-14 ENCOUNTER — Ambulatory Visit (INDEPENDENT_AMBULATORY_CARE_PROVIDER_SITE_OTHER): Payer: Medicare Other | Admitting: Family

## 2016-11-14 ENCOUNTER — Encounter: Payer: Self-pay | Admitting: Family

## 2016-11-14 VITALS — BP 135/80 | HR 95 | Temp 98.1°F | Resp 14 | Ht 62.0 in | Wt 180.6 lb

## 2016-11-14 DIAGNOSIS — J45909 Unspecified asthma, uncomplicated: Secondary | ICD-10-CM

## 2016-11-14 MED ORDER — PREDNISONE 10 MG PO TABS: ORAL_TABLET | ORAL | 0 refills | Status: DC

## 2016-11-14 MED FILL — predniSONE 10 MG TABS: 10 | 12 days supply | Qty: 30 | Fill #0

## 2016-11-14 NOTE — Progress Notes (Signed)
Pre visit review using our clinic review tool, if applicable. No additional management support is needed unless otherwise documented below in the visit note. 

## 2016-11-14 NOTE — Patient Instructions (Addendum)
Restart prednisone taper. Continue albuterol every 6 hours. Continue advair, singulair, zyrtec and protonix. You will be contacted about your referral to pulmonology. Call if new/worsening symptoms, fever, or if not improved in 3 days.

## 2016-11-14 NOTE — Progress Notes (Signed)
Subjective:    Patient ID: Sheryl [name], female    DOB: Mar 22, 1946, 71 y.o.   MRN: [id number]  HPI  Sheryl [name] is a 71 yr old female who presents today with c/o cough, SOB.  She recently had a hernia repair which was more extensive than her surgeon had expected. She did take zpak and prednisone with some improvement in her symptoms.  Reports that prior to beginning abx she had tmax of 101.    She reports generalized weakness since her surgery.  Feels like she has "too much congestion." She reports some post-nasal drip. Reports that she had a chest x-ray on the day she saw Dr. Kathie Dike and he completed a chest xray on 11/11/16.    Review of Systems See HPI  Past Medical History:  Diagnosis Date  . Anemia    pernicious  . Aortic ectasia (HCC)    infrarenal abdomina aorta 2.6 cm  . Asthma   . Colon cancer ([place]) 2012  . Diabetes mellitus    type 2  . Fatty liver 01/19/2015  . 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 History of pericarditis 07/26/2012  . Hyperlipidemia   . Pericarditis 07/26/2012  . Rheumatoid arthritis(714.0)   . Sjogren's syndrome ([place]) 03/17/2011  . Small bowel obstruction 03/2014   ?due to adhesions from colon surgery per pt  . Thyroid disease    hypothyroid  . Tobacco abuse      Social History   Social History  . Marital status: Single    Spouse name: N/A  . Number of children: 1  . Years of education: N/A   Occupational History  . unemployed Disabled   Social History Main Topics  . Smoking status: Former Smoker    Types: Cigarettes  . Smokeless tobacco: Never Used     Comment: 3 cigarettes a month. Uses smokeless cigarette.  . Alcohol use No     Comment: Has not had alcohol since SBO 03/2014  . Drug use: Yes     Comment: Smokes "medical grade" marijuana 2 puffs 3 x weekly for rheumatoid arthritis.per pt  . Sexual activity: Not on file   Other Topics Concern  . Not on file   Social History Narrative   Regular exercise:  Stretching exercises. Resistance bands   Caffeine: 1 mug (2cus) daily.          Past Surgical History:  Procedure Laterality Date  . COLON SURGERY  8/.23/13   tumor removed from sigmoid colon.  Marland Kitchen DILATION AND CURETTAGE OF UTERUS  1975  . KNEE SURGERY  2006   arthroscopic left knee  . KNEE SURGERY  2001   right knee    Family History  Problem Relation Age of Onset  . Heart disease Other     CAD  . Diabetes Other   . Hyperlipidemia Other   . Stroke Other     Allergies  Allergen Reactions  . Levofloxacin Shortness Of Breath and Rash  . Ativan [Lorazepam]     Visual hallucinations.  Visual hallucinations.   . Cefuroxime Axetil     REACTION: asthma, cough  . Hydrocodone-Homatropine Nausea Only  . Rosuvastatin Calcium     Muscle pain, syncope  . Statins     Muscle pain, syncope  . Zetia [Ezetimibe] Diarrhea  . Hydrocodone-Acetaminophen Nausea Only    Current Outpatient Prescriptions on File Prior to Visit  Medication Sig Dispense Refill  . albuterol (VENTOLIN HFA) 108 (90 Base) MCG/ACT inhaler  Inhale 2 puffs into the lungs every 6 (six) hours as needed for wheezing or shortness of breath. 54 g 1  . aspirin 81 MG tablet Take 81 mg by mouth daily.      Marland Kitchen azithromycin (ZITHROMAX) 250 MG tablet Take 250 mg by mouth daily.    . betamethasone dipropionate (DIPROLENE) 0.05 % cream Apply topically 2 (two) times daily. 30 g 0  . Cyanocobalamin (B-12) 1000 MCG CAPS Take 1,000 mcg by mouth daily.    . diclofenac sodium (VOLTAREN) 1 % GEL Apply 2 g topically 4 (four) times daily as needed. 100 g 2  . fish oil-omega-3 fatty acids 1000 MG capsule Take 2 capsules (2 g total) by mouth 2 (two) times daily.    . fluticasone (FLONASE) 50 MCG/ACT nasal spray Place 2 sprays into both nostrils daily as needed. 9.9 g 5  . Fluticasone-Salmeterol (ADVAIR DISKUS) 250-50 MCG/DOSE AEPB Inhale 1 puff into the lungs 2 (two) times daily. 3 each 1  . levothyroxine (SYNTHROID,  LEVOTHROID) 100 MCG tablet TAKE 1 TABLET EVERY DAY 90 tablet 1  . montelukast (SINGULAIR) 10 MG tablet TAKE 1 TABLET AT BEDTIME 90 tablet 1  . multivitamin (THERAGRAN) per tablet Take 1 tablet by mouth daily.      . NON FORMULARY TUMERIC MILK.    . NON FORMULARY ? BCB. Derivative of marijuana without the marijuana.    . ondansetron (ZOFRAN-ODT) 8 MG disintegrating tablet Take 1 tablet (8 mg total) by mouth every 8 (eight) hours as needed for nausea. 20 tablet 3  . pantoprazole (PROTONIX) 40 MG tablet TAKE 1 TABLET EVERY DAY 90 tablet 1  . predniSONE (DELTASONE) 10 MG tablet Take 40mg  once a day for 2 days, then 30mg  once a day for 2 days, then 20mg  once a day for 2 days then 10mg  once daily for 2 days.    . Probiotic Product (PROBIOTIC DAILY PO) Take 1 capsule by mouth daily.    Marland Kitchen triamcinolone cream (KENALOG) 0.1 % Apply 1 application topically 2 (two) times daily. 30 g 0  . zolpidem (AMBIEN) 5 MG tablet Take 1 tablet (5 mg total) by mouth at bedtime as needed for sleep. 30 tablet 0  . Zoster Vaccine Live, PF, (ZOSTAVAX) 38250 UNT/0.65ML injection Inject 19,400 Units into the skin once. 1 each 0   No current facility-administered medications on file prior to visit.     BP 135/80 (BP Location: Right Arm, Patient Position: Sitting, Cuff Size: Normal)   Pulse 95   Temp 98.1 F (36.7 C) (Oral)   Resp 14   Ht 5\' 2"  (1.575 m)   Wt 180 lb 9.6 oz (81.9 kg)   SpO2 96%   BMI 33.03 kg/m       Objective:   Physical Exam  Constitutional: She appears well-developed and well-nourished.  Cardiovascular: Normal rate, regular rhythm and normal heart sounds.   No murmur heard. Pulmonary/Chest: Effort normal and breath sounds normal. No respiratory distress. She has no wheezes.  Psychiatric: She has a normal mood and affect. Her behavior is normal. Judgment and thought content normal.          Assessment & Plan:  Asthma with acute exacerbation- advised pt as follows.  Restart prednisone  taper. Continue albuterol every 6 hours. Continue advair, singulair, zyrtec and protonix. You will be contacted about your referral to pulmonology. Call if new/worsening symptoms, fever, or if not improved in 3 days.

## 2016-12-03 ENCOUNTER — Other Ambulatory Visit: Payer: Self-pay | Admitting: Family

## 2016-12-03 MED FILL — FLUTICASONE PROP 50 MCG SPR: 50 | 30 days supply | Qty: 16 | Fill #1

## 2016-12-03 NOTE — Telephone Encounter (Signed)
eScribe request from [facility] for refill on Nystatin Suspension Last filled - 09/12/15, Discontinued on 12/26/15 as Therapy completed per patient Last AEX - 11/14/16 Please Advise on refills/SLS 04/11

## 2016-12-04 MED FILL — NYSTATIN 100,000 UNITS/ML S: 100000 | 9 days supply | Qty: 180 | Fill #0

## 2016-12-05 ENCOUNTER — Other Ambulatory Visit: Payer: Self-pay

## 2016-12-05 MED ORDER — ZOLPIDEM TARTRATE 5 MG PO TABS: 5.0000 mg | ORAL_TABLET | Freq: Every evening | ORAL | 0 refills | Status: DC | PRN

## 2016-12-05 MED FILL — ZOLPIDEM TARTRATE 5 MG TAB: 5 | 30 days supply | Qty: 30 | Fill #0

## 2016-12-05 NOTE — Telephone Encounter (Addendum)
Left pt a voice mail to call back. RX faxed to pharmacy.

## 2016-12-05 NOTE — Telephone Encounter (Signed)
Refill for Zolpidem    Last RX:10/20/16 Last OV:11/14/16 Next OV:12/19/16 UDS: None  TXH:FSFS   Please advise

## 2016-12-12 ENCOUNTER — Ambulatory Visit: Payer: Medicare Other | Admitting: Family

## 2016-12-19 ENCOUNTER — Encounter: Payer: Self-pay | Admitting: Family

## 2016-12-19 ENCOUNTER — Ambulatory Visit (INDEPENDENT_AMBULATORY_CARE_PROVIDER_SITE_OTHER): Payer: Medicare Other | Admitting: Family

## 2016-12-19 ENCOUNTER — Encounter (INDEPENDENT_AMBULATORY_CARE_PROVIDER_SITE_OTHER): Payer: Self-pay

## 2016-12-19 VITALS — BP 128/82 | HR 94 | Temp 98.1°F | Resp 16 | Ht 62.75 in | Wt 180.2 lb

## 2016-12-19 DIAGNOSIS — Z794 Long term (current) use of insulin: Secondary | ICD-10-CM | POA: Diagnosis not present

## 2016-12-19 DIAGNOSIS — J4520 Mild intermittent asthma, uncomplicated: Secondary | ICD-10-CM | POA: Diagnosis not present

## 2016-12-19 DIAGNOSIS — E119 Type 2 diabetes mellitus without complications: Secondary | ICD-10-CM

## 2016-12-19 DIAGNOSIS — E785 Hyperlipidemia, unspecified: Secondary | ICD-10-CM | POA: Diagnosis not present

## 2016-12-19 DIAGNOSIS — E039 Hypothyroidism, unspecified: Secondary | ICD-10-CM

## 2016-12-19 DIAGNOSIS — J309 Allergic rhinitis, unspecified: Secondary | ICD-10-CM | POA: Diagnosis not present

## 2016-12-19 LAB — BASIC METABOLIC PANEL
BUN: 13 mg/dL (ref 6–23)
CALCIUM: 9.5 mg/dL (ref 8.4–10.5)
CO2: 26 mEq/L (ref 19–32)
CREATININE: 0.8 mg/dL (ref 0.40–1.20)
Chloride: 103 mEq/L (ref 96–112)
GFR: 75.48 mL/min (ref >60.00)
GLUCOSE: 108 mg/dL — AB (ref 70–99)
Potassium: 4.6 mEq/L (ref 3.5–5.1)
SODIUM: 136 meq/L (ref 135–145)

## 2016-12-19 LAB — LIPID PANEL
CHOLESTEROL: 189 mg/dL (ref 0–200)
HDL: 32.4 mg/dL — ABNORMAL LOW (ref >39.00)
LDL CALC: 123 mg/dL — AB (ref 0–99)
NonHDL: 157.02
Total CHOL/HDL Ratio: 6
Triglycerides: 170 mg/dL — ABNORMAL HIGH (ref 0.0–149.0)
VLDL: 34 mg/dL (ref 0.0–40.0)

## 2016-12-19 LAB — HEMOGLOBIN A1C: Hgb A1c MFr Bld: 6.4 % (ref 4.6–6.5)

## 2016-12-19 NOTE — Assessment & Plan Note (Signed)
Clinically stable on synthroid, continue same.  

## 2016-12-19 NOTE — Assessment & Plan Note (Signed)
>>  ASSESSMENT AND PLAN FOR HYPERLIPIDEMIA WRITTEN ON 12/19/2016  4:55 PM BY O'SULLIVAN, Carmino Ocain, NP  Uncontrolled. Obtain follow up lipid panel.

## 2016-12-19 NOTE — Patient Instructions (Signed)
Please complete lab work prior to leaving.   

## 2016-12-19 NOTE — Assessment & Plan Note (Signed)
Uncontrolled. Obtain follow up lipid panel.

## 2016-12-19 NOTE — Assessment & Plan Note (Signed)
Stable on singulair, continue same.

## 2016-12-19 NOTE — Progress Notes (Signed)
Subjective:    Patient ID: Sheryl [name], female    DOB: 1946/06/23, 71 y.o.   MRN: [id number]  HPI  Sheryl [name] is a 71 yr old female who presents today for follow up.  1) Hypothyroid- maintained on synthroid 121mcg. Feels well on this dose. Lab Results  Component Value Date   TSH 1.05 10/14/2016   2) Hyperlipidemia- intolerant to statins.  Lab Results  Component Value Date   CHOL 187 09/12/2016   HDL 31.40 (L) 09/12/2016   LDLCALC 117 (H) 09/12/2016   TRIG 194.0 (H) 09/12/2016   CHOLHDL 6 09/12/2016   3) Dm2-  Lab Results  Component Value Date   HGBA1C 6.3 09/12/2016   HGBA1C 6.3 12/26/2015   HGBA1C 6.2 08/03/2015   Lab Results  Component Value Date   MICROALBUR 5.5 (H) 08/03/2015   LDLCALC 117 (H) 09/12/2016   CREATININE 0.86 10/14/2016   4) Nasal congestion- reports that she has some post-nasal drip. Taking singulair.   5) Asthma- stable on singulair and advair.   Wt Readings from Last 3 Encounters:  12/19/16 180 lb 3.2 oz (81.7 kg)  11/14/16 180 lb 9.6 oz (81.9 kg)  10/14/16 185 lb 6.4 oz (84.1 kg)     Review of Systems See HPI  Past Medical History:  Diagnosis Date  . Anemia    pernicious  . Aortic ectasia (HCC)    infrarenal abdomina aorta 2.6 cm  . Asthma   . Colon cancer ([place]) 2012  . Diabetes mellitus    type 2  . Fatty liver 01/19/2015  . 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 History of pericarditis 07/26/2012  . Hyperlipidemia   . Pericarditis 07/26/2012  . Rheumatoid arthritis(714.0)   . Sjogren's syndrome ([place]) 03/17/2011  . Small bowel obstruction ([place]) 03/2014   ?due to adhesions from colon surgery per pt  . Thyroid disease    hypothyroid  . Tobacco abuse      Social History   Social History  . Marital status: Single    Spouse name: N/A  . Number of children: 1  . Years of education: N/A   Occupational History  . unemployed Disabled   Social History Main Topics  . Smoking  status: Former Smoker    Types: Cigarettes  . Smokeless tobacco: Never Used     Comment: 3 cigarettes a month. Uses smokeless cigarette.  . Alcohol use No     Comment: Has not had alcohol since SBO 03/2014  . Drug use: Yes     Comment: Smokes "medical grade" marijuana 2 puffs 3 x weekly for rheumatoid arthritis.per pt  . Sexual activity: Not on file   Other Topics Concern  . Not on file   Social History Narrative   Regular exercise:  Stretching exercises. Resistance bands   Caffeine: 1 mug (2cus) daily.          Past Surgical History:  Procedure Laterality Date  . COLON SURGERY  8/.23/13   tumor removed from sigmoid colon.  Marland Kitchen DILATION AND CURETTAGE OF UTERUS  1975  . KNEE SURGERY  2006   arthroscopic left knee  . KNEE SURGERY  2001   right knee    Family History  Problem Relation Age of Onset  . Heart disease Other     CAD  . Diabetes Other   . Hyperlipidemia Other   . Stroke Other     Allergies  Allergen Reactions  . Levofloxacin Shortness Of Breath and  Rash  . Ativan [Lorazepam]     Visual hallucinations.  Visual hallucinations.   . Cefuroxime Axetil     REACTION: asthma, cough  . Hydrocodone-Homatropine Nausea Only  . Rosuvastatin Calcium     Muscle pain, syncope  . Statins     Muscle pain, syncope  . Zetia [Ezetimibe] Diarrhea  . Hydrocodone-Acetaminophen Nausea Only    Current Outpatient Prescriptions on File Prior to Visit  Medication Sig Dispense Refill  . albuterol (VENTOLIN HFA) 108 (90 Base) MCG/ACT inhaler Inhale 2 puffs into the lungs every 6 (six) hours as needed for wheezing or shortness of breath. 54 g 1  . aspirin 81 MG tablet Take 81 mg by mouth daily.      . betamethasone dipropionate (DIPROLENE) 0.05 % cream Apply topically 2 (two) times daily. 30 g 0  . Cyanocobalamin (B-12) 1000 MCG CAPS Take 1,000 mcg by mouth daily.    . diclofenac sodium (VOLTAREN) 1 % GEL Apply 2 g topically 4 (four) times daily as needed. 100 g 2  . fish  oil-omega-3 fatty acids 1000 MG capsule Take 2 capsules (2 g total) by mouth 2 (two) times daily.    . fluticasone (FLONASE) 50 MCG/ACT nasal spray Place 2 sprays into both nostrils daily as needed. 9.9 g 5  . Fluticasone-Salmeterol (ADVAIR DISKUS) 250-50 MCG/DOSE AEPB Inhale 1 puff into the lungs 2 (two) times daily. 3 each 1  . levothyroxine (SYNTHROID, LEVOTHROID) 100 MCG tablet TAKE 1 TABLET EVERY DAY 90 tablet 1  . montelukast (SINGULAIR) 10 MG tablet TAKE 1 TABLET AT BEDTIME 90 tablet 1  . multivitamin (THERAGRAN) per tablet Take 1 tablet by mouth daily.      . NON FORMULARY TUMERIC MILK.    . NON FORMULARY ? BCB. Derivative of marijuana without the marijuana.    . nystatin (MYCOSTATIN) 100000 UNIT/ML suspension TAKE 5 ML BY MOUTH 4 TIMES A DAY 180 mL 0  . ondansetron (ZOFRAN-ODT) 8 MG disintegrating tablet Take 1 tablet (8 mg total) by mouth every 8 (eight) hours as needed for nausea. 20 tablet 3  . pantoprazole (PROTONIX) 40 MG tablet TAKE 1 TABLET EVERY DAY 90 tablet 1  . predniSONE (DELTASONE) 10 MG tablet 4 tabs by mouth once daily for 3 days, then 3 tabs daily x 3 days, then 2 tabs daily x 3 days, then 1 tab daily x 3 days 30 tablet 0  . Probiotic Product (PROBIOTIC DAILY PO) Take 1 capsule by mouth daily.    Marland Kitchen triamcinolone cream (KENALOG) 0.1 % Apply 1 application topically 2 (two) times daily. 30 g 0  . zolpidem (AMBIEN) 5 MG tablet Take 1 tablet (5 mg total) by mouth at bedtime as needed for sleep. 30 tablet 0  . Zoster Vaccine Live, PF, (ZOSTAVAX) 64403 UNT/0.65ML injection Inject 19,400 Units into the skin once. 1 each 0   No current facility-administered medications on file prior to visit.     BP 128/82 (BP Location: Left Arm, Cuff Size: Large)   Pulse 94   Temp 98.1 F (36.7 C) (Oral)   Resp 16   Ht 5' 2.75" (1.594 m)   Wt 180 lb 3.2 oz (81.7 kg)   SpO2 100% Comment: room air  BMI 32.18 kg/m       Objective:   Physical Exam  Constitutional: She appears  well-developed and well-nourished.  Cardiovascular: Normal rate, regular rhythm and normal heart sounds.   No murmur heard. Pulmonary/Chest: Effort normal and breath sounds normal. No respiratory  distress. She has no wheezes.  Abdominal:  Some scar tissue noted beneath abdominal incisional scar- incision appears well approximated, well healed.   Psychiatric: She has a normal mood and affect. Her behavior is normal. Judgment and thought content normal.          Assessment & Plan:

## 2016-12-19 NOTE — Assessment & Plan Note (Signed)
Stable on singulair/advair. Continue same.

## 2017-01-01 ENCOUNTER — Institutional Professional Consult (permissible substitution): Payer: PRIVATE HEALTH INSURANCE | Admitting: Pulmonary Disease

## 2017-01-05 ENCOUNTER — Telehealth: Payer: Self-pay | Admitting: *Deleted

## 2017-01-05 NOTE — Telephone Encounter (Signed)
Received fax from [facility] mail order stating "New rx for levothyroxine 124mcg. Pt reports 82mcg, allergy and there is a potential for cross sensitivity. Please confirm if ok to fill medication?"  Will call them tomorrow for further clarification.

## 2017-01-06 NOTE — Telephone Encounter (Signed)
Spoke with pt, she reports no previous allergy to levothyroxine and it is not currently listed on her allergy list. Pt confirmed that she has 169mcg tablets at home and is currently taking that once a day. Gave authorization to pharmacist, Winfred Burn to dispense 116mcg rx and no previously reported allergy to levothyroxine with [place]. They will release Rx.

## 2017-01-06 NOTE — Telephone Encounter (Signed)
Our notes indicate she is taking 182mcg of synthroid. Please confirm with patient.

## 2017-01-16 ENCOUNTER — Telehealth: Payer: Self-pay | Admitting: *Deleted

## 2017-01-16 MED ORDER — ZOLPIDEM TARTRATE 5 MG PO TABS: 5.0000 mg | ORAL_TABLET | Freq: Every evening | ORAL | 0 refills | Status: DC | PRN

## 2017-01-16 MED FILL — ZOLPIDEM TARTRATE 5 MG TAB: 5 | 30 days supply | Qty: 30 | Fill #0

## 2017-01-16 NOTE — Telephone Encounter (Signed)
Received fax from [facility] requesting refill of zolpidem 5mg .  Last RX: 12/05/16, #30 Last OV: 12/19/16 Next OV: 03/20/17 UDS: No UDS or CSC on file.  Rx printed and forwarded to PCP for signature.

## 2017-01-16 NOTE — Telephone Encounter (Signed)
Rx faxed to pharmacy at [facility].

## 2017-01-29 ENCOUNTER — Encounter: Payer: Self-pay | Admitting: Pulmonary Disease

## 2017-01-29 ENCOUNTER — Encounter (INDEPENDENT_AMBULATORY_CARE_PROVIDER_SITE_OTHER): Payer: Self-pay

## 2017-01-29 ENCOUNTER — Ambulatory Visit (INDEPENDENT_AMBULATORY_CARE_PROVIDER_SITE_OTHER): Payer: Medicare Other | Admitting: Pulmonary Disease

## 2017-01-29 VITALS — BP 159/90 | HR 83 | Ht 62.75 in | Wt 181.0 lb

## 2017-01-29 DIAGNOSIS — J432 Centrilobular emphysema: Secondary | ICD-10-CM

## 2017-01-29 DIAGNOSIS — J4520 Mild intermittent asthma, uncomplicated: Secondary | ICD-10-CM

## 2017-01-29 HISTORY — DX: Centrilobular emphysema: J43.2

## 2017-01-29 MED ORDER — FLUTICASONE-UMECLIDIN-VILANT 100-62.5-25 MCG/INH IN AEPB: 1.0000 | INHALATION_SPRAY | Freq: Every day | RESPIRATORY_TRACT | 0 refills | Status: DC

## 2017-01-29 NOTE — Assessment & Plan Note (Signed)
Discussed allergen avoidance. Repeat spirometry pre-and post next visit to look for reversibility She would like to finish the Advair that she has, before starting on new medication

## 2017-01-29 NOTE — Patient Instructions (Addendum)
  Lung function is decreased to 50% Trial of Trelegy instead of Advair-call for prescription of this works  Referral to pulmonary rehabilitation at [facility] have to absolutely stop smoking

## 2017-01-29 NOTE — Assessment & Plan Note (Addendum)
FEV1 is decreased to 50%. Not clear at this time if this is reversible or not-in other words words whether this indicates smoking-related lung damage or fixed obstruction due to chronic asthma  Trial of Trelegy instead of Advair-call for prescription of this works  Referral to pulmonary rehabilitation at [facility] have to absolutely stop smoking

## 2017-01-29 NOTE — Progress Notes (Signed)
Subjective:    Patient ID: Sheryl [name], female    DOB: 10/05/1945, 71 y.o.   MRN: [id number]  HPI   Chief Complaint  Patient presents with  . Pulm Consult    Keeps a constant URI. Had taken rounds of prednisone and abx. Has a history of asthma. Is very allergic to mold. Wants a 2nd opinion on inhalers currently using.      71 year old smoker referred for evaluation of shortness of breath and wheezing. She reports while infections every year especially during the winter and this is increasingly becoming difficult to resolve. She reports hernia surgery in 10/2016, she developed respiratory distress postop and required hospitalization for 3 days. She required 2 rounds of antibiotics and prednisone for this to eventually resolve. She smoked about half pack per day for 40 years-about 20 pack years. She will still smoke occasionally about one cigarette a month and also uses Nicorette gum when the urge becomes very severe.  She reports onset of asthma since her 60s which she relates to exposure to mold. She has been maintained on Advair for 2 decades and tried Symbicort somewhere along the line. Her triggers include URIs, she uses albuterol once daily. She reports intermittent shortness of breath and wheezing. She does admit to a sudden tree lifestyle and her exercise tolerance has decreased. She denies seasonal variation or allergies.  She has rheumatoid arthritis since age 47, needed steroids for a short time, could not tolerate Biologics. This has caused deformity of her hands and also affected her knees and ankles. She had colon cancer status post colectomy in 2013 and undergoes surveillance CT scans. CT chest/abdomen/pelvis from 11/2015 was reviewed which showed mild-to-moderate apical emphysema 2 mm right upper lobe nodule stable since 06/2012 and stable calcified granuloma in the posterior right upper lobe adrenal adenoma was also noted probably benign.  Spirometry today shows moderate  airway obstruction with a ratio of 54, FEV1 of 50% and FVC of 70%. Post bronchodilator testing was not performed    Past Medical History:  Diagnosis Date  . Anemia    pernicious  . Aortic ectasia (HCC)    infrarenal abdomina aorta 2.6 cm  . Asthma   . Colon cancer ([place]) 2012  . Diabetes mellitus    type 2  . Fatty liver 01/19/2015  . 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 History of pericarditis 07/26/2012  . Hyperlipidemia   . Pericarditis 07/26/2012  . Rheumatoid arthritis(714.0)   . Sjogren's syndrome ([place]) 03/17/2011  . Small bowel obstruction ([place]) 03/2014   ?due to adhesions from colon surgery per pt  . Thyroid disease    hypothyroid  . Tobacco abuse    Past Surgical History:  Procedure Laterality Date  . COLON SURGERY  8/.23/13   tumor removed from sigmoid colon.  Marland Kitchen DILATION AND CURETTAGE OF UTERUS  1975  . KNEE SURGERY  2006   arthroscopic left knee  . KNEE SURGERY  2001   right knee     Allergies  Allergen Reactions  . Levofloxacin Shortness Of Breath and Rash  . Mold Extract [Trichophyton] Anaphylaxis  . Ativan [Lorazepam]     Visual hallucinations.  Visual hallucinations.   . Cefuroxime Axetil     REACTION: asthma, cough  . Hydrocodone-Homatropine Nausea Only  . Rosuvastatin Calcium     Muscle pain, syncope  . Statins     Muscle pain, syncope  . Zetia [Ezetimibe] Diarrhea  . Hydrocodone-Acetaminophen Nausea Only  Social History   Social History  . Marital status: Single    Spouse name: N/A  . Number of children: 1  . Years of education: N/A   Occupational History  . unemployed Disabled   Social History Main Topics  . Smoking status: Former Smoker    Types: Cigarettes  . Smokeless tobacco: Never Used     Comment: 3 cigarettes a month. Uses smokeless cigarette.  . Alcohol use No     Comment: Has not had alcohol since SBO 03/2014  . Drug use: Yes     Comment: Smokes "medical grade" marijuana 2 puffs 3 x  weekly for rheumatoid arthritis.per pt  . Sexual activity: Not on file   Other Topics Concern  . Not on file   Social History Narrative   Regular exercise:  Stretching exercises. Resistance bands   Caffeine: 1 mug (2cus) daily.            Family History  Problem Relation Age of Onset  . Heart disease Other        CAD  . Diabetes Other   . Hyperlipidemia Other   . Stroke Other     Review of Systems Constitutional: negative for anorexia, fevers and sweats  Eyes: negative for irritation, redness and visual disturbance  Ears, nose, mouth, throat, and face: negative for earaches, epistaxis, nasal congestion and sore throat  Respiratory: negative for hemoptysis Cardiovascular: negative for chest pain, dyspnea, lower extremity edema, orthopnea, palpitations and syncope  Gastrointestinal: negative for abdominal pain, constipation, diarrhea, melena, nausea and vomiting  Genitourinary:negative for dysuria, frequency and hematuria  Hematologic/lymphatic: negative for bleeding, easy bruising and lymphadenopathy  Musculoskeletal:negative for arthralgias, muscle weakness and stiff joints  Neurological: negative for coordination problems, gait problems, headaches and weakness  Endocrine: negative for diabetic symptoms including polydipsia, polyuria and weight loss     Objective:   Physical Exam  Gen. Pleasant, obese, in no distress, normal affect ENT - no lesions, no post nasal drip, class 2-3 airway Neck: No JVD, no thyromegaly, no carotid bruits Lungs: no use of accessory muscles, no dullness to percussion, decreased without rales or rhonchi  Cardiovascular: Rhythm regular, heart sounds  normal, no murmurs or gallops, no peripheral edema Abdomen: soft and non-tender, no hepatosplenomegaly, BS normal. Musculoskeletal: No deformities, no cyanosis or clubbing Neuro:  alert, non focal, no tremors       Assessment & Plan:

## 2017-02-11 ENCOUNTER — Telehealth: Payer: Self-pay | Admitting: Pulmonary Disease

## 2017-02-11 NOTE — Telephone Encounter (Signed)
Left message for patient to call back tomorrow morning.  

## 2017-02-12 ENCOUNTER — Other Ambulatory Visit: Payer: Self-pay

## 2017-02-12 MED ORDER — FLUTICASONE-UMECLIDIN-VILANT 100-62.5-25 MCG/INH IN AEPB: 1.0000 | INHALATION_SPRAY | Freq: Every day | RESPIRATORY_TRACT | 1 refills | Status: DC

## 2017-02-12 MED ORDER — FLUTICASONE-UMECLIDIN-VILANT 100-62.5-25 MCG/INH IN AEPB: 1.0000 | INHALATION_SPRAY | Freq: Every day | RESPIRATORY_TRACT | 0 refills | Status: DC

## 2017-02-12 NOTE — Telephone Encounter (Signed)
Patient returned She stated that the Trelegy is working for her, it's a subtle change but a definite one She would like 2 samples if possible while she waits for the Rx to be processed/mailed to her from her [facility] (also requesting Rx be sent today) Checked with Cherina in the [facility] office, she does have 2 samples that can be given to patient Pt is aware and will pick up samples this morning  Rx sent to [facility] Nothing further needed; will sign off.

## 2017-02-17 ENCOUNTER — Telehealth: Payer: Self-pay | Admitting: *Deleted

## 2017-02-17 MED ORDER — ZOLPIDEM TARTRATE 5 MG PO TABS: 5.0000 mg | ORAL_TABLET | Freq: Every evening | ORAL | 0 refills | Status: DC | PRN

## 2017-02-17 MED FILL — ZOLPIDEM TARTRATE 5 MG TAB: 5 | 30 days supply | Qty: 30 | Fill #0

## 2017-02-17 NOTE — Telephone Encounter (Signed)
Faxed refill request received from [facility] for Zolpidem 5 mg tab ast filled by MD on 05/26/018, #[id number] Last AEX - 12/19/16 [Acute with labs] Next AEX - Not Stated Please Advise on refills/SLS 06/26

## 2017-02-17 NOTE — Telephone Encounter (Signed)
Rx printed and was called to [place] at [facility], #30 x no refills.

## 2017-02-26 ENCOUNTER — Telehealth: Payer: Self-pay | Admitting: Pulmonary Disease

## 2017-02-26 MED ORDER — FLUTICASONE-UMECLIDIN-VILANT 100-62.5-25 MCG/INH IN AEPB: 1.0000 | INHALATION_SPRAY | Freq: Every day | RESPIRATORY_TRACT | 3 refills | Status: DC

## 2017-02-26 NOTE — Telephone Encounter (Signed)
Spoke with pt, who request Rx for Trelegy to be sent to local pharmacy. Pt states Rx was sent to mail order and she can not use the coupon that she was given at [facility].  Rx has been sent to [place]. Nothing further needed.

## 2017-03-03 ENCOUNTER — Telehealth: Payer: Self-pay | Admitting: *Deleted

## 2017-03-03 DIAGNOSIS — J432 Centrilobular emphysema: Secondary | ICD-10-CM | POA: Diagnosis not present

## 2017-03-03 NOTE — Telephone Encounter (Signed)
Received Physician Orders from [facility], forwarded to provider/SLS 07/10

## 2017-03-09 DIAGNOSIS — J432 Centrilobular emphysema: Secondary | ICD-10-CM | POA: Diagnosis not present

## 2017-03-09 MED FILL — TRELEGY ELLIPTA 100-62.5-25: 100-62.5-25 | 30 days supply | Qty: 60 | Fill #0

## 2017-03-10 ENCOUNTER — Other Ambulatory Visit: Payer: Self-pay | Admitting: Family

## 2017-03-11 DIAGNOSIS — J432 Centrilobular emphysema: Secondary | ICD-10-CM | POA: Diagnosis not present

## 2017-03-12 DIAGNOSIS — J432 Centrilobular emphysema: Secondary | ICD-10-CM | POA: Diagnosis not present

## 2017-03-16 DIAGNOSIS — J432 Centrilobular emphysema: Secondary | ICD-10-CM | POA: Diagnosis not present

## 2017-03-18 DIAGNOSIS — J432 Centrilobular emphysema: Secondary | ICD-10-CM | POA: Diagnosis not present

## 2017-03-19 DIAGNOSIS — J432 Centrilobular emphysema: Secondary | ICD-10-CM | POA: Diagnosis not present

## 2017-03-20 ENCOUNTER — Encounter: Payer: Self-pay | Admitting: Family

## 2017-03-20 ENCOUNTER — Ambulatory Visit (INDEPENDENT_AMBULATORY_CARE_PROVIDER_SITE_OTHER): Payer: Medicare Other | Admitting: Family

## 2017-03-20 VITALS — BP 132/95 | HR 77 | Temp 97.3°F | Ht 62.75 in | Wt 183.2 lb

## 2017-03-20 DIAGNOSIS — J45909 Unspecified asthma, uncomplicated: Secondary | ICD-10-CM | POA: Diagnosis not present

## 2017-03-20 DIAGNOSIS — M0600 Rheumatoid arthritis without rheumatoid factor, unspecified site: Secondary | ICD-10-CM

## 2017-03-20 DIAGNOSIS — E039 Hypothyroidism, unspecified: Secondary | ICD-10-CM | POA: Diagnosis not present

## 2017-03-20 DIAGNOSIS — R739 Hyperglycemia, unspecified: Secondary | ICD-10-CM | POA: Diagnosis not present

## 2017-03-20 DIAGNOSIS — G4700 Insomnia, unspecified: Secondary | ICD-10-CM

## 2017-03-20 DIAGNOSIS — E119 Type 2 diabetes mellitus without complications: Secondary | ICD-10-CM

## 2017-03-20 LAB — TSH: TSH: 1.35 u[IU]/mL (ref 0.35–4.50)

## 2017-03-20 MED ORDER — TRAZODONE HCL 50 MG PO TABS: 25.0000 mg | ORAL_TABLET | Freq: Every evening | ORAL | 3 refills | Status: DC | PRN

## 2017-03-20 MED FILL — traZODone HCL 50 MG TABS: 50 | 30 days supply | Qty: 30 | Fill #0

## 2017-03-20 NOTE — Progress Notes (Signed)
Subjective:    Patient ID: Sheryl [name], female    DOB: [birth year], 71 y.o.   MRN: [id number]  HPI  Patient is a 71 year old female who presents today for follow-up.  Hypothyroid-maintained on Synthroid. Reports feeling well on current dose.  Lab Results  Component Value Date   TSH 1.05 10/14/2016   Diabetes type 2-  Lab Results  Component Value Date   HGBA1C 6.4 12/19/2016   HGBA1C 6.3 09/12/2016   HGBA1C 6.3 12/26/2015   Lab Results  Component Value Date   MICROALBUR 5.5 (H) 08/03/2015   LDLCALC 123 (H) 12/19/2016   CREATININE 0.80 12/19/2016   Asthma- maintained on Singulair, Advair, and as needed albuterol. She is following with pulmonology andis doing pulmonary rehab which she feels is going well.  Insomnia-she is maintained on as needed Ambien. Sometimes it helps and other times it does not.    Rheumatoid arthritis- not currently seeking treatment for RA.  Decines biologiqes.     Review of Systems See HPI  Past Medical History:  Diagnosis Date  . Anemia    pernicious  . Aortic ectasia (HCC)    infrarenal abdomina aorta 2.6 cm  . Asthma   . Colon cancer ([place]) 2012  . Diabetes mellitus    type 2  . Fatty liver 01/19/2015  . 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 History of pericarditis 07/26/2012  . Hyperlipidemia   . Pericarditis 07/26/2012  . Rheumatoid arthritis(714.0)   . Sjogren's syndrome ([place]) 03/17/2011  . Small bowel obstruction ([place]) 03/2014   ?due to adhesions from colon surgery per pt  . Thyroid disease    hypothyroid  . Tobacco abuse      Social History   Social History  . Marital status: Single    Spouse name: N/A  . Number of children: 1  . Years of education: N/A   Occupational History  . unemployed Disabled   Social History Main Topics  . Smoking status: Former Smoker    Types: Cigarettes  . Smokeless tobacco: Never Used     Comment: 3 cigarettes a month. Uses smokeless cigarette.  .  Alcohol use No     Comment: Has not had alcohol since SBO 03/2014  . Drug use: Yes     Comment: Smokes "medical grade" marijuana 2 puffs 3 x weekly for rheumatoid arthritis.per pt  . Sexual activity: Not on file   Other Topics Concern  . Not on file   Social History Narrative   Regular exercise:  Stretching exercises. Resistance bands   Caffeine: 1 mug (2cus) daily.          Past Surgical History:  Procedure Laterality Date  . COLON SURGERY  8/.23/13   tumor removed from sigmoid colon.  Marland Kitchen DILATION AND CURETTAGE OF UTERUS  1975  . KNEE SURGERY  2006   arthroscopic left knee  . KNEE SURGERY  2001   right knee    Family History  Problem Relation Age of Onset  . Heart disease Other        CAD  . Diabetes Other   . Hyperlipidemia Other   . Stroke Other     Allergies  Allergen Reactions  . Levofloxacin Shortness Of Breath and Rash  . Mold Extract [Trichophyton] Anaphylaxis  . Ativan [Lorazepam]     Visual hallucinations.  Visual hallucinations.   . Cefuroxime Axetil     REACTION: asthma, cough  . Hydrocodone-Homatropine Nausea Only  . Rosuvastatin Calcium  Muscle pain, syncope  . Statins     Muscle pain, syncope  . Zetia [Ezetimibe] Diarrhea  . Hydrocodone-Acetaminophen Nausea Only    Current Outpatient Prescriptions on File Prior to Visit  Medication Sig Dispense Refill  . albuterol (VENTOLIN HFA) 108 (90 Base) MCG/ACT inhaler Inhale 2 puffs into the lungs every 6 (six) hours as needed for wheezing or shortness of breath. 54 g 1  . aspirin 81 MG tablet Take 81 mg by mouth daily.      . betamethasone dipropionate (DIPROLENE) 0.05 % cream Apply topically 2 (two) times daily. 30 g 0  . Cyanocobalamin (B-12) 1000 MCG CAPS Take 1,000 mcg by mouth daily.    . diclofenac sodium (VOLTAREN) 1 % GEL Apply 2 g topically 4 (four) times daily as needed. 100 g 2  . fish oil-omega-3 fatty acids 1000 MG capsule Take 2 capsules (2 g total) by mouth 2 (two) times daily.    .  fluticasone (FLONASE) 50 MCG/ACT nasal spray Place 2 sprays into both nostrils daily as needed. 9.9 g 5  . Fluticasone-Salmeterol (ADVAIR DISKUS) 250-50 MCG/DOSE AEPB Inhale 1 puff into the lungs 2 (two) times daily. 3 each 1  . Fluticasone-Umeclidin-Vilant (TRELEGY ELLIPTA) 100-62.5-25 MCG/INH AEPB Inhale 1 puff into the lungs daily. 3 each 1  . levothyroxine (SYNTHROID, LEVOTHROID) 100 MCG tablet TAKE 1 TABLET EVERY DAY 90 tablet 1  . montelukast (SINGULAIR) 10 MG tablet TAKE 1 TABLET AT BEDTIME 90 tablet 1  . multivitamin (THERAGRAN) per tablet Take 1 tablet by mouth daily.      . NON FORMULARY TUMERIC MILK.    . NON FORMULARY ? BCB. Derivative of marijuana without the marijuana.    . nystatin (MYCOSTATIN) 100000 UNIT/ML suspension TAKE 5 ML BY MOUTH 4 TIMES A DAY 180 mL 0  . ondansetron (ZOFRAN-ODT) 8 MG disintegrating tablet Take 1 tablet (8 mg total) by mouth every 8 (eight) hours as needed for nausea. 20 tablet 3  . pantoprazole (PROTONIX) 40 MG tablet TAKE 1 TABLET EVERY DAY 90 tablet 1  . Probiotic Product (PROBIOTIC DAILY PO) Take 1 capsule by mouth daily.    Marland Kitchen triamcinolone cream (KENALOG) 0.1 % Apply 1 application topically 2 (two) times daily. 30 g 0  . zolpidem (AMBIEN) 5 MG tablet Take 1 tablet (5 mg total) by mouth at bedtime as needed for sleep. 30 tablet 0  . Zoster Vaccine Live, PF, (ZOSTAVAX) 98921 UNT/0.65ML injection Inject 19,400 Units into the skin once. 1 each 0   No current facility-administered medications on file prior to visit.     BP (!) 132/95   Pulse 77   Temp (!) 97.3 F (36.3 C) (Oral)   Ht 5' 2.75" (1.594 m)   Wt 183 lb 3.2 oz (83.1 kg)   SpO2 98%   BMI 32.71 kg/m       Objective:   Physical Exam  Constitutional: She is oriented to person, place, and time. She appears well-developed and well-nourished.  Cardiovascular: Normal rate, regular rhythm and normal heart sounds.   No murmur heard. Pulmonary/Chest: Breath sounds normal. No respiratory  distress. She has no wheezes.  Neurological: She is alert and oriented to person, place, and time.  Psychiatric: She has a normal mood and affect. Her behavior is normal. Judgment and thought content normal.          Assessment & Plan:  Hypothyroid-clinically stable on Synthroid continue same, obtain follow-up TSH.  Diabetes type 2-clinically stable obtain follow-up A1c  Insomnia-uncontrolled.  She self Sharyn Dross with marijuana due to her rheumatoid arthritis. I discussed with her that our office policy is that she would sign a controlled substance contract and to intermittent urine drug screens in order to continue to receive controlled substances. Therefore she will need to choose between use of Ambien or self medicating with marijuana. She reports that she would like to continue to use the marijuana and would like to stop use of Ambien. I will give her a trial of as needed trazodone.  Asthma-clinically stable, following with pulmonology.  Rheumatoid arthritis-stable. She has declined follow-up with rheumatology.

## 2017-03-20 NOTE — Patient Instructions (Signed)
Stop ambien. Begin trazodone 1/2 to 1 tablet by mouth once daily.

## 2017-03-21 LAB — HEMOGLOBIN A1C
Hgb A1c MFr Bld: 5.9 % — ABNORMAL HIGH (ref <5.7)
MEAN PLASMA GLUCOSE: 123 mg/dL

## 2017-03-23 DIAGNOSIS — J432 Centrilobular emphysema: Secondary | ICD-10-CM | POA: Diagnosis not present

## 2017-03-26 DIAGNOSIS — J432 Centrilobular emphysema: Secondary | ICD-10-CM | POA: Diagnosis not present

## 2017-03-30 DIAGNOSIS — J432 Centrilobular emphysema: Secondary | ICD-10-CM | POA: Diagnosis not present

## 2017-04-01 DIAGNOSIS — J432 Centrilobular emphysema: Secondary | ICD-10-CM | POA: Diagnosis not present

## 2017-04-02 DIAGNOSIS — Z5189 Encounter for other specified aftercare: Secondary | ICD-10-CM | POA: Diagnosis not present

## 2017-04-02 DIAGNOSIS — J432 Centrilobular emphysema: Secondary | ICD-10-CM | POA: Diagnosis not present

## 2017-04-06 DIAGNOSIS — J432 Centrilobular emphysema: Secondary | ICD-10-CM | POA: Diagnosis not present

## 2017-04-06 DIAGNOSIS — Z5189 Encounter for other specified aftercare: Secondary | ICD-10-CM | POA: Diagnosis not present

## 2017-04-08 DIAGNOSIS — Z5189 Encounter for other specified aftercare: Secondary | ICD-10-CM | POA: Diagnosis not present

## 2017-04-08 DIAGNOSIS — J432 Centrilobular emphysema: Secondary | ICD-10-CM | POA: Diagnosis not present

## 2017-04-09 ENCOUNTER — Ambulatory Visit: Payer: Medicare Other | Admitting: Pulmonary Disease

## 2017-04-14 ENCOUNTER — Telehealth: Payer: Self-pay | Admitting: Family

## 2017-04-14 NOTE — Telephone Encounter (Signed)
Called pt to schedule AWV. Lvm for pt to call office to schedule appt.  °

## 2017-04-15 DIAGNOSIS — Z5189 Encounter for other specified aftercare: Secondary | ICD-10-CM | POA: Diagnosis not present

## 2017-04-15 DIAGNOSIS — J432 Centrilobular emphysema: Secondary | ICD-10-CM | POA: Diagnosis not present

## 2017-04-16 DIAGNOSIS — Z5189 Encounter for other specified aftercare: Secondary | ICD-10-CM | POA: Diagnosis not present

## 2017-04-16 DIAGNOSIS — J432 Centrilobular emphysema: Secondary | ICD-10-CM | POA: Diagnosis not present

## 2017-04-17 ENCOUNTER — Telehealth: Payer: Self-pay | Admitting: *Deleted

## 2017-04-17 NOTE — Telephone Encounter (Signed)
AWV scheduled 05/19/17 @1030 

## 2017-04-20 DIAGNOSIS — J432 Centrilobular emphysema: Secondary | ICD-10-CM | POA: Diagnosis not present

## 2017-04-20 DIAGNOSIS — Z5189 Encounter for other specified aftercare: Secondary | ICD-10-CM | POA: Diagnosis not present

## 2017-04-20 MED FILL — traZODone HCL 50 MG TABS: 50 | 30 days supply | Qty: 30 | Fill #1

## 2017-04-21 ENCOUNTER — Encounter: Payer: Medicare Other | Admitting: Family

## 2017-04-22 DIAGNOSIS — Z5189 Encounter for other specified aftercare: Secondary | ICD-10-CM | POA: Diagnosis not present

## 2017-04-22 DIAGNOSIS — J432 Centrilobular emphysema: Secondary | ICD-10-CM | POA: Diagnosis not present

## 2017-04-23 DIAGNOSIS — Z5189 Encounter for other specified aftercare: Secondary | ICD-10-CM | POA: Diagnosis not present

## 2017-04-23 DIAGNOSIS — J432 Centrilobular emphysema: Secondary | ICD-10-CM | POA: Diagnosis not present

## 2017-04-24 ENCOUNTER — Ambulatory Visit: Payer: Medicare Other | Admitting: Adult Health

## 2017-04-28 MED FILL — CLINDAMYCIN HCL 300 MG CAPS: 300 | 7 days supply | Qty: 21 | Fill #0

## 2017-04-29 DIAGNOSIS — H04123 Dry eye syndrome of bilateral lacrimal glands: Secondary | ICD-10-CM | POA: Diagnosis not present

## 2017-04-29 DIAGNOSIS — H524 Presbyopia: Secondary | ICD-10-CM | POA: Diagnosis not present

## 2017-04-29 DIAGNOSIS — J432 Centrilobular emphysema: Secondary | ICD-10-CM | POA: Diagnosis not present

## 2017-04-30 ENCOUNTER — Encounter: Payer: Self-pay | Admitting: Adult Health

## 2017-04-30 ENCOUNTER — Ambulatory Visit (INDEPENDENT_AMBULATORY_CARE_PROVIDER_SITE_OTHER): Payer: Medicare Other | Admitting: Adult Health

## 2017-04-30 ENCOUNTER — Ambulatory Visit: Payer: Medicare Other | Admitting: Pulmonary Disease

## 2017-04-30 VITALS — BP 123/83 | HR 66 | Ht 62.75 in | Wt 189.0 lb

## 2017-04-30 DIAGNOSIS — J432 Centrilobular emphysema: Secondary | ICD-10-CM

## 2017-04-30 DIAGNOSIS — J4520 Mild intermittent asthma, uncomplicated: Secondary | ICD-10-CM

## 2017-04-30 NOTE — Assessment & Plan Note (Signed)
Doing well in pulmonary rehab.

## 2017-04-30 NOTE — Patient Instructions (Signed)
Continue on TRELEGY 1 puff daily . Rinse after use.  Continue with pulmonary rehab.  Work on not smoking .  Follow up with Dr. Elsworth Soho  In 4 months and As needed

## 2017-04-30 NOTE — Assessment & Plan Note (Signed)
Asthma /COPD improved symptom control on TRELEGY  Tried to do Spirometry but machine is down  Needs flu shot but supply not in yet, advised to get soon .   Plan  Patient Instructions  Continue on TRELEGY 1 puff daily . Rinse after use.  Continue with pulmonary rehab.  Work on not smoking .  Follow up with Dr. Elsworth Soho  In 4 months and As needed

## 2017-04-30 NOTE — Progress Notes (Signed)
@Patient  ID: Sheryl [name], female    DOB: [birth year], 71 y.o.   MRN: [id number]  Chief Complaint  Patient presents with  . Follow-up    Asthma     Referring provider: [name], NP  HPI: 71 year old female, smoker seen for pulmonary consult  01/2017 for dyspnea  And wheezing found to have Moderate to Severe obstructive lung disease with Asthma vs COPD   Has RA -previously on biologics and methotrexate in past . (none since 2013)  Colon cancer 2013 s/p resection   TEST  Spirometry today shows moderate airway obstruction with a ratio of 54, FEV1 of 50% and FVC of 70%. Post bronchodilator testing was not performed CT chest 2017 >mild to mod emphysema 2 mm RUL nodule stable since 2013, mild reticulation in right base.   04/30/2017 Follow up : Asthma /COPD  Patient presents for a three-month follow-up. She was seen last visit for a Pulmonary  consult for shortness of breath and wheezing. Spirometry showed moderate to severe airflow obstruction. Patient was felt to have Asthma +/- COPD . She is smoker She was recommended on smoking cessation. Referred to pulmonary rehabilitation. And started on TRELEGY. . Feels it has really helped her a lot . No SABA use since starting TRELEGY .  She has started pulmonary rehab and feels it is has really made a big difference with her breathing and strength. Has cut back on smoking , only smokes 3 cigs a week.  CXR 10/2016 lungs clear . Care Everywhere reviewed.  She denies any chest pain, orthopnea, PND, or increased leg swelling   Allergies  Allergen Reactions  . Levofloxacin Shortness Of Breath and Rash  . Mold Extract [Trichophyton] Anaphylaxis  . Ativan [Lorazepam]     Visual hallucinations.  Visual hallucinations.   . Cefuroxime Axetil     REACTION: asthma, cough  . Hydrocodone-Homatropine Nausea Only  . Rosuvastatin Calcium     Muscle pain, syncope  . Statins     Muscle pain, syncope  . Zetia [Ezetimibe] Diarrhea  .  Hydrocodone-Acetaminophen Nausea Only    Immunization History  Administered Date(s) Administered  . Influenza Split 07/07/2011  . Influenza Whole 06/05/2009, 04/24/2010  . Influenza, High Dose Seasonal PF 06/29/2015, 04/30/2016  . Influenza,inj,Quad PF,6+ Mos 06/01/2013, 05/25/2014  . Pneumococcal Conjugate-13 06/01/2013  . Pneumococcal Polysaccharide-23 10/01/2010, 09/12/2016  . Td 08/25/2002  . Tdap 02/14/2013    Past Medical History:  Diagnosis Date  . Anemia    pernicious  . Aortic ectasia (HCC)    infrarenal abdomina aorta 2.6 cm  . Asthma   . Colon cancer ([place]) 2012  . Diabetes mellitus    type 2  . Fatty liver 01/19/2015  . 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 History of pericarditis 07/26/2012  . Hyperlipidemia   . Pericarditis 07/26/2012  . Rheumatoid arthritis(714.0)   . Sjogren's syndrome ([place]) 03/17/2011  . Small bowel obstruction ([place]) 03/2014   ?due to adhesions from colon surgery per pt  . Thyroid disease    hypothyroid  . Tobacco abuse     Tobacco History: History  Smoking Status  . Former Smoker  . Types: Cigarettes  Smokeless Tobacco  . Never Used    Comment: 3 cigarettes a month. Uses smokeless cigarette.   Counseling given: Not Answered   Outpatient Encounter Prescriptions as of 04/30/2017  Medication Sig  . albuterol (VENTOLIN HFA) 108 (90 Base) MCG/ACT inhaler Inhale 2 puffs into the lungs every 6 (six)  hours as needed for wheezing or shortness of breath.  Marland Kitchen aspirin 81 MG tablet Take 81 mg by mouth daily.    . betamethasone dipropionate (DIPROLENE) 0.05 % cream Apply topically 2 (two) times daily.  . Cyanocobalamin (B-12) 1000 MCG CAPS Take 1,000 mcg by mouth daily.  . diclofenac sodium (VOLTAREN) 1 % GEL Apply 2 g topically 4 (four) times daily as needed.  . fish oil-omega-3 fatty acids 1000 MG capsule Take 2 capsules (2 g total) by mouth 2 (two) times daily.  . fluticasone (FLONASE) 50 MCG/ACT nasal spray  Place 2 sprays into both nostrils daily as needed.  . Fluticasone-Umeclidin-Vilant (TRELEGY ELLIPTA) 100-62.5-25 MCG/INH AEPB Inhale 1 puff into the lungs daily.  Marland Kitchen levothyroxine (SYNTHROID, LEVOTHROID) 100 MCG tablet TAKE 1 TABLET EVERY DAY  . montelukast (SINGULAIR) 10 MG tablet TAKE 1 TABLET AT BEDTIME  . multivitamin (THERAGRAN) per tablet Take 1 tablet by mouth daily.    . NON FORMULARY TUMERIC MILK.  . NON FORMULARY ? BCB. Derivative of marijuana without the marijuana.  . nystatin (MYCOSTATIN) 100000 UNIT/ML suspension TAKE 5 ML BY MOUTH 4 TIMES A DAY  . ondansetron (ZOFRAN-ODT) 8 MG disintegrating tablet Take 1 tablet (8 mg total) by mouth every 8 (eight) hours as needed for nausea.  . pantoprazole (PROTONIX) 40 MG tablet TAKE 1 TABLET EVERY DAY  . Probiotic Product (PROBIOTIC DAILY PO) Take 1 capsule by mouth daily.  . traZODone (DESYREL) 50 MG tablet Take 0.5-1 tablets (25-50 mg total) by mouth at bedtime as needed for sleep.  Marland Kitchen triamcinolone cream (KENALOG) 0.1 % Apply 1 application topically 2 (two) times daily.  Marland Kitchen zolpidem (AMBIEN) 5 MG tablet Take 1 tablet (5 mg total) by mouth at bedtime as needed for sleep.  Marland Kitchen Zoster Vaccine Live, PF, (ZOSTAVAX) 29562 UNT/0.65ML injection Inject 19,400 Units into the skin once.  . [DISCONTINUED] Fluticasone-Salmeterol (ADVAIR DISKUS) 250-50 MCG/DOSE AEPB Inhale 1 puff into the lungs 2 (two) times daily.   No facility-administered encounter medications on file as of 04/30/2017.      Review of Systems  Constitutional:   No  weight loss, night sweats,  Fevers, chills,  +fatigue, or  lassitude.  HEENT:   No headaches,  Difficulty swallowing,  Tooth/dental problems, or  Sore throat,                No sneezing, itching, ear ache, nasal congestion, post nasal drip,   CV:  No chest pain,  Orthopnea, PND, swelling in lower extremities, anasarca, dizziness, palpitations, syncope.   GI  No heartburn, indigestion, abdominal pain, nausea, vomiting,  diarrhea, change in bowel habits, loss of appetite, bloody stools.   Resp:    No chest wall deformity  Skin: no rash or lesions.  GU: no dysuria, change in color of urine, no urgency or frequency.  No flank pain, no hematuria   MS:  No joint pain or swelling.  No decreased range of motion.  No back pain.    Physical Exam  BP 123/83 (BP Location: Left Arm, Patient Position: Sitting, Cuff Size: Normal)   Pulse 66   Ht 5' 2.75" (1.594 m)   Wt 189 lb (85.7 kg)   SpO2 99%   BMI 33.75 kg/m   GEN: A/Ox3; pleasant  , NAD , obese    HEENT:  [place]/AT,  EACs-clear, TMs-wnl, NOSE-clear, THROAT-clear, no lesions, no postnasal drip or exudate noted.   NECK:  Supple w/ fair ROM; no JVD; normal carotid impulses w/o bruits; no thyromegaly  or nodules palpated; no lymphadenopathy.    RESP  Clear  P & A; w/o, wheezes/ rales/ or rhonchi. no accessory muscle use, no dullness to percussion  CARD:  RRR, no m/r/g, tr peripheral edema, pulses intact, no cyanosis or clubbing.  GI:   Soft & nt; nml bowel sounds; no organomegaly or masses detected.   Musco: Warm bil, no deformities or joint swelling noted.   Neuro: alert, no focal deficits noted.    Skin: Warm, no lesions or rashes    Lab Results:  CBC    Component Value Date/Time   WBC 6.5 10/14/2016 0941   RBC 5.01 10/14/2016 0941   HGB 15.4 (H) 10/14/2016 0941   HCT 45.8 10/14/2016 0941   PLT 157.0 10/14/2016 0941   MCV 91.4 10/14/2016 0941   MCH 30.9 04/18/2014 2046   MCHC 33.6 10/14/2016 0941   RDW 14.7 10/14/2016 0941   LYMPHSABS 2.0 10/14/2016 0941   MONOABS 0.4 10/14/2016 0941   EOSABS 0.2 10/14/2016 0941   BASOSABS 0.1 10/14/2016 0941    BMET    Component Value Date/Time   NA 136 12/19/2016 1128   K 4.6 12/19/2016 1128   CL 103 12/19/2016 1128   CO2 26 12/19/2016 1128   GLUCOSE 108 (H) 12/19/2016 1128   BUN 13 12/19/2016 1128   CREATININE 0.80 12/19/2016 1128   CREATININE 0.87 02/03/2014 1119   CALCIUM 9.5 12/19/2016  1128   GFRNONAA 57 (L) 04/18/2014 2046   GFRNONAA 81 11/30/2013 1156   GFRAA 67 (L) 04/18/2014 2046   GFRAA >89 11/30/2013 1156    BNP No results found for: BNP  ProBNP No results found for: PROBNP  Imaging: No results found.   Assessment & Plan:   Asthma Asthma /COPD improved symptom control on TRELEGY  Tried to do Spirometry but machine is down  Needs flu shot but supply not in yet, advised to get soon .   Plan  Patient Instructions  Continue on TRELEGY 1 puff daily . Rinse after use.  Continue with pulmonary rehab.  Work on not smoking .  Follow up with Dr. Elsworth Soho  In 4 months and As needed       Centrilobular emphysema [facility]) Doing well in pulmonary rehab.      Rexene Edison, NP 04/30/2017

## 2017-05-04 DIAGNOSIS — J432 Centrilobular emphysema: Secondary | ICD-10-CM | POA: Diagnosis not present

## 2017-05-04 NOTE — Progress Notes (Signed)
Reviewed & agree with plan  

## 2017-05-06 DIAGNOSIS — J432 Centrilobular emphysema: Secondary | ICD-10-CM | POA: Diagnosis not present

## 2017-05-07 DIAGNOSIS — J432 Centrilobular emphysema: Secondary | ICD-10-CM | POA: Diagnosis not present

## 2017-05-13 DIAGNOSIS — J432 Centrilobular emphysema: Secondary | ICD-10-CM | POA: Diagnosis not present

## 2017-05-14 DIAGNOSIS — J432 Centrilobular emphysema: Secondary | ICD-10-CM | POA: Diagnosis not present

## 2017-05-15 NOTE — Progress Notes (Signed)
Subjective:   Sheryl [name] is a 71 y.o. female who presents for Medicare Annual (Subsequent) preventive examination.   Pt is very pleasant. Expresses great interest and involvement in politics and upcoming election.  Review of Systems:  No ROS.  Medicare Wellness Visit. Additional risk factors are reflected in the social history.  Cardiac Risk Factors include: diabetes mellitus;advanced age (>80men, >68 women);dyslipidemia Sleep patterns: Restless sleep.   Home Safety/Smoke Alarms: Feels safe in home. Smoke alarms in place.  Living environment; residence and Firearm Safety: Lives alone in 1 story home. Seat Belt Safety/Bike Helmet: Wears seat belt.   Female:   Pap- last 01/31/15: atrophic vaginitis      Mammo- last 09/30/16:BI-RADS CATEGORY  1: Negative. Dexa scan- last 02/12/15 . ORDERED TODAY  CCS- last 01/02/15    Objective:     Vitals: BP 139/80 (BP Location: Left Arm, Patient Position: Sitting, Cuff Size: Normal)   Pulse 74   Ht 5\' 3"  (1.6 m)   Wt 182 lb 9.6 oz (82.8 kg)   SpO2 97%   BMI 32.35 kg/m   Body mass index is 32.35 kg/m.   Tobacco History  Smoking Status  . Former Smoker  . Types: Cigarettes  Smokeless Tobacco  . Never Used    Comment: 3 cigarettes a month. Uses smokeless cigarette.     Counseling given: Not Answered   Past Medical History:  Diagnosis Date  . Anemia    pernicious  . Aortic ectasia (HCC)    infrarenal abdomina aorta 2.6 cm  . Asthma   . Colon cancer ([place]) 2012  . Diabetes mellitus    type 2  . Fatty liver 01/19/2015  . 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 History of pericarditis 07/26/2012  . Hyperlipidemia   . Pericarditis 07/26/2012  . Rheumatoid arthritis(714.0)   . Sjogren's syndrome ([place]) 03/17/2011  . Small bowel obstruction ([place]) 03/2014   ?due to adhesions from colon surgery per pt  . Thyroid disease    hypothyroid  . Tobacco abuse    Past Surgical History:  Procedure  Laterality Date  . CHOLECYSTECTOMY  10/23/2016  . COLON SURGERY  8/.23/13   tumor removed from sigmoid colon.  Marland Kitchen DILATION AND CURETTAGE OF UTERUS  1975  . HERNIA REPAIR  10/23/2016  . KNEE SURGERY  2006   arthroscopic left knee  . KNEE SURGERY  2001   right knee   Family History  Problem Relation Age of Onset  . Heart disease Other        CAD  . Diabetes Other   . Hyperlipidemia Other   . Stroke Other    History  Sexual Activity  . Sexual activity: No    Outpatient Encounter Prescriptions as of 05/19/2017  Medication Sig  . albuterol (VENTOLIN HFA) 108 (90 Base) MCG/ACT inhaler Inhale 2 puffs into the lungs every 6 (six) hours as needed for wheezing or shortness of breath.  Marland Kitchen aspirin 81 MG tablet Take 81 mg by mouth daily.    . betamethasone dipropionate (DIPROLENE) 0.05 % cream Apply topically 2 (two) times daily.  . Cyanocobalamin (B-12) 1000 MCG CAPS Take 1,000 mcg by mouth daily.  . diclofenac sodium (VOLTAREN) 1 % GEL Apply 2 g topically 4 (four) times daily as needed.  . fish oil-omega-3 fatty acids 1000 MG capsule Take 2 capsules (2 g total) by mouth 2 (two) times daily.  . fluticasone (FLONASE) 50 MCG/ACT nasal spray Place 2 sprays into both  nostrils daily as needed.  . Fluticasone-Umeclidin-Vilant (TRELEGY ELLIPTA) 100-62.5-25 MCG/INH AEPB Inhale 1 puff into the lungs daily.  Marland Kitchen levothyroxine (SYNTHROID, LEVOTHROID) 100 MCG tablet TAKE 1 TABLET EVERY DAY  . montelukast (SINGULAIR) 10 MG tablet TAKE 1 TABLET AT BEDTIME  . multivitamin (THERAGRAN) per tablet Take 1 tablet by mouth daily.    . NON FORMULARY TUMERIC MILK.  . NON FORMULARY CBD. Derivative of marijuana without the marijuana.  . nystatin (MYCOSTATIN) 100000 UNIT/ML suspension TAKE 5 ML BY MOUTH 4 TIMES A DAY (Patient taking differently: TAKE 5 ML BY MOUTH 4 TIMES A DAY as needed)  . ondansetron (ZOFRAN-ODT) 8 MG disintegrating tablet Take 1 tablet (8 mg total) by mouth every 8 (eight) hours as needed for  nausea.  . pantoprazole (PROTONIX) 40 MG tablet TAKE 1 TABLET EVERY DAY  . Probiotic Product (PROBIOTIC DAILY PO) Take 1 capsule by mouth daily.  Marland Kitchen triamcinolone cream (KENALOG) 0.1 % Apply 1 application topically 2 (two) times daily.  . traZODone (DESYREL) 50 MG tablet Take 0.5-1 tablets (25-50 mg total) by mouth at bedtime as needed for sleep. (Patient not taking: Reported on 05/19/2017)  . zolpidem (AMBIEN) 5 MG tablet Take 1 tablet (5 mg total) by mouth at bedtime as needed for sleep. (Patient not taking: Reported on 05/19/2017)  . Zoster Vaccine Live, PF, (ZOSTAVAX) 31517 UNT/0.65ML injection Inject 19,400 Units into the skin once.   No facility-administered encounter medications on file as of 05/19/2017.     Activities of Daily Living In your present state of health, do you have any difficulty performing the following activities: 05/19/2017  Hearing? N  Vision? N  Comment Wears glasses for driving. Dr.Digby yearly.  Difficulty concentrating or making decisions? N  Walking or climbing stairs? Y  Comment painful due to RA  Dressing or bathing? N  Doing errands, shopping? N  Preparing Food and eating ? N  Using the Toilet? N  In the past six months, have you accidently leaked urine? N  Do you have problems with loss of bowel control? N  Managing your Medications? N  Managing your Finances? N  Housekeeping or managing your Housekeeping? N  Some recent data might be hidden    Patient Care Team: [name], NP as PCP - General Kerin Salen, DDS as Consulting Physician ([place]) Loyal as Consulting Physician (Ophthalmology) Lake Bells., MD as Consulting Physician (Gastroenterology) Brooks Sailors, MD as Consulting Physician (Surgery) Zebedee Iba., MD as Consulting Physician (Hematology and Oncology)    Assessment:    Physical assessment deferred to PCP.  Exercise Activities and Dietary recommendations Current Exercise Habits:  Structured exercise class, Type of exercise: strength training/weights;walking, Time (Minutes): 60, Frequency (Times/Week): 3, Weekly Exercise (Minutes/Week): 180, Intensity: Mild   Diet (meal preparation, eat out, water intake, caffeinated beverages, dairy products, fruits and vegetables): in general, a "healthy" diet    Goals    . Exercise 3x per week (15 min per time)          Increase walking as tolerated. Continue chair exercises and increase as tolerated.    Marland Kitchen HEMOGLOBIN A1C < 7.0      Fall Risk Fall Risk  03/03/2016 01/08/2015 02/10/2013  Falls in the past year? No No No   Depression Screen PHQ 2/9 Scores 03/03/2016 01/08/2015 02/10/2013  PHQ - 2 Score 0 1 0     Cognitive Function Ad8 score reviewed for issues:  Issues making decisions:no  Less interest in hobbies /  activities:no  Repeats questions, stories (family complaining):no  Trouble using ordinary gadgets (microwave, computer, phone):no  Forgets the month or year: no  Mismanaging finances: no  Remembering appts:no  Daily problems with thinking and/or memory:no Ad8 score is=0        Immunization History  Administered Date(s) Administered  . Influenza Split 07/07/2011  . Influenza Whole 06/05/2009, 04/24/2010  . Influenza, High Dose Seasonal PF 06/29/2015, 04/30/2016  . Influenza,inj,Quad PF,6+ Mos 06/01/2013, 05/25/2014  . Pneumococcal Conjugate-13 06/01/2013  . Pneumococcal Polysaccharide-23 10/01/2010, 09/12/2016  . Td 08/25/2002  . Tdap 02/14/2013   Screening Tests Health Maintenance  Topic Date Due  . URINE MICROALBUMIN  08/02/2016  . OPHTHALMOLOGY EXAM  12/23/2016  . INFLUENZA VACCINE  03/25/2017  . FOOT EXAM  09/12/2017  . HEMOGLOBIN A1C  09/20/2017  . MAMMOGRAM  09/30/2018  . TETANUS/TDAP  02/15/2023  . COLONOSCOPY  01/01/2025  . DEXA SCAN  Completed  . Hepatitis C Screening  Completed  . PNA vac Low Risk Adult  Completed      Plan:   Follow up with PCP today as  scheduled.  Continue to eat heart healthy diet (full of fruits, vegetables, whole grains, lean protein, water--limit salt, fat, and sugar intake) and increase physical activity as tolerated.  Continue doing brain stimulating activities (puzzles, reading, adult coloring books, staying active) to keep memory sharp.    I have personally reviewed and noted the following in the patient's chart:   . Medical and social history . Use of alcohol, tobacco or illicit drugs  . Current medications and supplements . Functional ability and status . Nutritional status . Physical activity . Advanced directives . List of other physicians . Hospitalizations, surgeries, and ER visits in previous 12 months . Vitals . Screenings to include cognitive, depression, and falls . Referrals and appointments  In addition, I have reviewed and discussed with patient certain preventive protocols, quality metrics, and best practice recommendations. A written personalized care plan for preventive services as well as general preventive health recommendations were provided to patient.     Naaman Plummer [place], [place]  05/19/2017

## 2017-05-18 DIAGNOSIS — J432 Centrilobular emphysema: Secondary | ICD-10-CM | POA: Diagnosis not present

## 2017-05-19 ENCOUNTER — Other Ambulatory Visit (INDEPENDENT_AMBULATORY_CARE_PROVIDER_SITE_OTHER): Payer: Medicare Other

## 2017-05-19 ENCOUNTER — Other Ambulatory Visit (HOSPITAL_COMMUNITY)
Admission: RE | Admit: 2017-05-19 | Discharge: 2017-05-19 | Disposition: A | Discharge status: Home / Self Care | Payer: Medicare Other | Source: Ambulatory Visit | Attending: Family | Admitting: Family

## 2017-05-19 ENCOUNTER — Telehealth: Payer: Self-pay | Admitting: Family

## 2017-05-19 ENCOUNTER — Encounter: Payer: Self-pay | Admitting: Family

## 2017-05-19 ENCOUNTER — Ambulatory Visit (INDEPENDENT_AMBULATORY_CARE_PROVIDER_SITE_OTHER): Payer: Medicare Other | Admitting: Family

## 2017-05-19 VITALS — BP 139/80 | HR 74 | Ht 63.0 in | Wt 182.6 lb

## 2017-05-19 DIAGNOSIS — L298 Other pruritus: Secondary | ICD-10-CM

## 2017-05-19 DIAGNOSIS — N952 Postmenopausal atrophic vaginitis: Secondary | ICD-10-CM

## 2017-05-19 DIAGNOSIS — N898 Other specified noninflammatory disorders of vagina: Secondary | ICD-10-CM

## 2017-05-19 DIAGNOSIS — Z148 Genetic carrier of other disease: Secondary | ICD-10-CM | POA: Diagnosis not present

## 2017-05-19 DIAGNOSIS — Z23 Encounter for immunization: Secondary | ICD-10-CM

## 2017-05-19 DIAGNOSIS — R7989 Other specified abnormal findings of blood chemistry: Secondary | ICD-10-CM

## 2017-05-19 DIAGNOSIS — R945 Abnormal results of liver function studies: Secondary | ICD-10-CM | POA: Diagnosis not present

## 2017-05-19 DIAGNOSIS — Z780 Asymptomatic menopausal state: Secondary | ICD-10-CM

## 2017-05-19 DIAGNOSIS — L409 Psoriasis, unspecified: Secondary | ICD-10-CM

## 2017-05-19 LAB — HEPATIC FUNCTION PANEL
ALT: 50 U/L — AB (ref 0–35)
AST: 51 U/L — AB (ref 0–37)
Albumin: 3.9 g/dL (ref 3.5–5.2)
Alkaline Phosphatase: 79 U/L (ref 39–117)
BILIRUBIN TOTAL: 0.5 mg/dL (ref 0.2–1.2)
Bilirubin, Direct: 0.1 mg/dL (ref 0.0–0.3)
Total Protein: 8.3 g/dL (ref 6.0–8.3)

## 2017-05-19 LAB — FERRITIN: FERRITIN: 78.4 ng/mL (ref 10.0–291.0)

## 2017-05-19 MED ORDER — ESTRADIOL 0.1 MG/GM VA CREA: TOPICAL_CREAM | VAGINAL | 5 refills | Status: DC

## 2017-05-19 MED ORDER — BETAMETHASONE DIPROPIONATE 0.05 % EX CREA: TOPICAL_CREAM | Freq: Two times a day (BID) | CUTANEOUS | 1 refills | Status: DC

## 2017-05-19 NOTE — Assessment & Plan Note (Signed)
rx with estrace. We discussed risk/benefits. OK to use topical steroid cream externally prn itching.  Wet prep swab obtained as well.

## 2017-05-19 NOTE — Patient Instructions (Signed)
Please begin estrogen cream.  Schedule follow up with dermatology for your psoriasis.

## 2017-05-19 NOTE — Telephone Encounter (Signed)
Ferritin is normal.  I would like her to see GI due to elevated lft.

## 2017-05-19 NOTE — Progress Notes (Signed)
Subjective:    Patient ID: Sheryl [name], female    DOB: 1946/02/13, 71 y.o.   MRN: [id number]  HPI  Vaginal itching- pt reports vaginal itching. No discharge.  Hydrocortisone cream helps. Has been a longstanding problem.    Psoriasis- reports flare up on her palms.   Abnormal LFT-  Lab Results  Component Value Date   ALT 54 (H) 10/14/2016   AST 63 (H) 10/14/2016   ALKPHOS 73 10/14/2016   BILITOT 0.3 10/14/2016     Review of Systems See HPI  Past Medical History:  Diagnosis Date  . Anemia    pernicious  . Aortic ectasia (HCC)    infrarenal abdomina aorta 2.6 cm  . Asthma   . Colon cancer ([place]) 2012  . Diabetes mellitus    type 2  . Fatty liver 01/19/2015  . 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 History of pericarditis 07/26/2012  . Hyperlipidemia   . Pericarditis 07/26/2012  . Rheumatoid arthritis(714.0)   . Sjogren's syndrome ([place]) 03/17/2011  . Small bowel obstruction ([place]) 03/2014   ?due to adhesions from colon surgery per pt  . Thyroid disease    hypothyroid  . Tobacco abuse      Social History   Social History  . Marital status: Single    Spouse name: N/A  . Number of children: 1  . Years of education: N/A   Occupational History  . unemployed Disabled   Social History Main Topics  . Smoking status: Former Smoker    Types: Cigarettes  . Smokeless tobacco: Never Used     Comment: 3 cigarettes a month. Uses smokeless cigarette.  . Alcohol use No     Comment: Has not had alcohol since SBO 03/2014  . Drug use: Yes     Comment: Smokes "medical grade" marijuana 2 puffs 3 x weekly for rheumatoid arthritis.per pt  . Sexual activity: No   Other Topics Concern  . Not on file   Social History Narrative   Regular exercise:  Stretching exercises. Resistance bands   Caffeine: 1 mug (2cus) daily.          Past Surgical History:  Procedure Laterality Date  . CHOLECYSTECTOMY  10/23/2016  . COLON SURGERY  8/.23/13     tumor removed from sigmoid colon.  Marland Kitchen DILATION AND CURETTAGE OF UTERUS  1975  . HERNIA REPAIR  10/23/2016  . KNEE SURGERY  2006   arthroscopic left knee  . KNEE SURGERY  2001   right knee    Family History  Problem Relation Age of Onset  . Heart disease Other        CAD  . Diabetes Other   . Hyperlipidemia Other   . Stroke Other     Allergies  Allergen Reactions  . Levofloxacin Shortness Of Breath and Rash  . Mold Extract [Trichophyton] Anaphylaxis  . Ativan [Lorazepam]     Visual hallucinations.  Visual hallucinations.   . Cefuroxime Axetil     REACTION: asthma, cough  . Hydrocodone-Homatropine Nausea Only  . Rosuvastatin Calcium     Muscle pain, syncope  . Statins     Muscle pain, syncope  . Zetia [Ezetimibe] Diarrhea  . Hydrocodone-Acetaminophen Nausea Only    Current Outpatient Prescriptions on File Prior to Visit  Medication Sig Dispense Refill  . albuterol (VENTOLIN HFA) 108 (90 Base) MCG/ACT inhaler Inhale 2 puffs into the lungs every 6 (six) hours as needed for wheezing or shortness of breath.  54 g 1  . aspirin 81 MG tablet Take 81 mg by mouth daily.      . Cyanocobalamin (B-12) 1000 MCG CAPS Take 1,000 mcg by mouth daily.    . diclofenac sodium (VOLTAREN) 1 % GEL Apply 2 g topically 4 (four) times daily as needed. 100 g 2  . fish oil-omega-3 fatty acids 1000 MG capsule Take 2 capsules (2 g total) by mouth 2 (two) times daily.    . fluticasone (FLONASE) 50 MCG/ACT nasal spray Place 2 sprays into both nostrils daily as needed. 9.9 g 5  . Fluticasone-Umeclidin-Vilant (TRELEGY ELLIPTA) 100-62.5-25 MCG/INH AEPB Inhale 1 puff into the lungs daily. 3 each 1  . levothyroxine (SYNTHROID, LEVOTHROID) 100 MCG tablet TAKE 1 TABLET EVERY DAY 90 tablet 1  . montelukast (SINGULAIR) 10 MG tablet TAKE 1 TABLET AT BEDTIME 90 tablet 1  . multivitamin (THERAGRAN) per tablet Take 1 tablet by mouth daily.      . NON FORMULARY TUMERIC MILK.    . NON FORMULARY CBD. Derivative of  marijuana without the marijuana.    . nystatin (MYCOSTATIN) 100000 UNIT/ML suspension TAKE 5 ML BY MOUTH 4 TIMES A DAY (Patient taking differently: TAKE 5 ML BY MOUTH 4 TIMES A DAY as needed) 180 mL 0  . ondansetron (ZOFRAN-ODT) 8 MG disintegrating tablet Take 1 tablet (8 mg total) by mouth every 8 (eight) hours as needed for nausea. 20 tablet 3  . pantoprazole (PROTONIX) 40 MG tablet TAKE 1 TABLET EVERY DAY 90 tablet 1  . Probiotic Product (PROBIOTIC DAILY PO) Take 1 capsule by mouth daily.    Marland Kitchen triamcinolone cream (KENALOG) 0.1 % Apply 1 application topically 2 (two) times daily. 30 g 0  . traZODone (DESYREL) 50 MG tablet Take 0.5-1 tablets (25-50 mg total) by mouth at bedtime as needed for sleep. (Patient not taking: Reported on 05/19/2017) 30 tablet 3  . zolpidem (AMBIEN) 5 MG tablet Take 1 tablet (5 mg total) by mouth at bedtime as needed for sleep. (Patient not taking: Reported on 05/19/2017) 30 tablet 0  . Zoster Vaccine Live, PF, (ZOSTAVAX) 44967 UNT/0.65ML injection Inject 19,400 Units into the skin once. 1 each 0   No current facility-administered medications on file prior to visit.     BP 139/80 (BP Location: Left Arm, Patient Position: Sitting, Cuff Size: Normal)   Pulse 74   Ht 5\' 3"  (1.6 m)   Wt 182 lb 9.6 oz (82.8 kg)   SpO2 97%   BMI 32.35 kg/m       Objective:   Physical Exam  Constitutional: She is oriented to person, place, and time. She appears well-developed and well-nourished.  Cardiovascular: Normal rate, regular rhythm and normal heart sounds.   No murmur heard. Pulmonary/Chest: Effort normal and breath sounds normal. No respiratory distress. She has no wheezes.  Genitourinary:  Genitourinary Comments: Atrophic dry vaginal mucosa. No discharge, no vaginal lesions  Neurological: She is alert and oriented to person, place, and time.  Skin:  Dry scaling skin bilateral palms  Psychiatric: She has a normal mood and affect. Her behavior is normal. Judgment and  thought content normal.          Assessment & Plan:  Psoriasis- uncontrolled. advised pt to follow up with her dermatologist.   Abnormal LFT- obtain follow up lipid panel.

## 2017-05-19 NOTE — Addendum Note (Signed)
Addended by: Kelle Darting A on: 05/19/2017 01:03 PM   Modules accepted: Orders

## 2017-05-20 DIAGNOSIS — J432 Centrilobular emphysema: Secondary | ICD-10-CM | POA: Diagnosis not present

## 2017-05-20 LAB — CERVICOVAGINAL ANCILLARY ONLY
BACTERIAL VAGINITIS: NEGATIVE
Candida vaginitis: NEGATIVE

## 2017-05-20 NOTE — Telephone Encounter (Signed)
Notified pt and she is agreeable to proceed with referral. 

## 2017-05-21 ENCOUNTER — Encounter: Payer: Self-pay | Admitting: Family

## 2017-05-21 DIAGNOSIS — J432 Centrilobular emphysema: Secondary | ICD-10-CM | POA: Diagnosis not present

## 2017-05-25 DIAGNOSIS — J45909 Unspecified asthma, uncomplicated: Secondary | ICD-10-CM | POA: Diagnosis not present

## 2017-05-25 DIAGNOSIS — J432 Centrilobular emphysema: Secondary | ICD-10-CM | POA: Diagnosis not present

## 2017-05-26 MED FILL — IBUPROFEN 600 MG TABLET: 600 | 5 days supply | Qty: 20 | Fill #0

## 2017-05-26 MED FILL — DOXYCYCLINE HYCLATE 100 MG: 100 | 7 days supply | Qty: 14 | Fill #0

## 2017-06-03 DIAGNOSIS — J45909 Unspecified asthma, uncomplicated: Secondary | ICD-10-CM | POA: Diagnosis not present

## 2017-06-03 DIAGNOSIS — J432 Centrilobular emphysema: Secondary | ICD-10-CM | POA: Diagnosis not present

## 2017-06-04 DIAGNOSIS — J45909 Unspecified asthma, uncomplicated: Secondary | ICD-10-CM | POA: Diagnosis not present

## 2017-06-04 DIAGNOSIS — J432 Centrilobular emphysema: Secondary | ICD-10-CM | POA: Diagnosis not present

## 2017-06-08 DIAGNOSIS — J432 Centrilobular emphysema: Secondary | ICD-10-CM | POA: Diagnosis not present

## 2017-06-08 DIAGNOSIS — J45909 Unspecified asthma, uncomplicated: Secondary | ICD-10-CM | POA: Diagnosis not present

## 2017-06-10 DIAGNOSIS — J432 Centrilobular emphysema: Secondary | ICD-10-CM | POA: Diagnosis not present

## 2017-06-10 DIAGNOSIS — J45909 Unspecified asthma, uncomplicated: Secondary | ICD-10-CM | POA: Diagnosis not present

## 2017-06-11 DIAGNOSIS — J45909 Unspecified asthma, uncomplicated: Secondary | ICD-10-CM | POA: Diagnosis not present

## 2017-06-11 DIAGNOSIS — J432 Centrilobular emphysema: Secondary | ICD-10-CM | POA: Diagnosis not present

## 2017-06-19 DIAGNOSIS — E279 Disorder of adrenal gland, unspecified: Secondary | ICD-10-CM | POA: Diagnosis not present

## 2017-06-19 DIAGNOSIS — Z85038 Personal history of other malignant neoplasm of large intestine: Secondary | ICD-10-CM | POA: Diagnosis not present

## 2017-06-19 DIAGNOSIS — R911 Solitary pulmonary nodule: Secondary | ICD-10-CM | POA: Diagnosis not present

## 2017-06-22 ENCOUNTER — Ambulatory Visit: Payer: Medicare Other | Admitting: Family

## 2017-06-26 DIAGNOSIS — Z720 Tobacco use: Secondary | ICD-10-CM | POA: Diagnosis not present

## 2017-06-26 DIAGNOSIS — Z85038 Personal history of other malignant neoplasm of large intestine: Secondary | ICD-10-CM | POA: Diagnosis not present

## 2017-06-26 DIAGNOSIS — R590 Localized enlarged lymph nodes: Secondary | ICD-10-CM | POA: Diagnosis not present

## 2017-06-26 DIAGNOSIS — Z08 Encounter for follow-up examination after completed treatment for malignant neoplasm: Secondary | ICD-10-CM | POA: Diagnosis not present

## 2017-06-26 DIAGNOSIS — C187 Malignant neoplasm of sigmoid colon: Secondary | ICD-10-CM | POA: Diagnosis not present

## 2017-07-30 ENCOUNTER — Other Ambulatory Visit: Payer: Self-pay

## 2017-09-14 ENCOUNTER — Telehealth: Payer: Self-pay | Admitting: *Deleted

## 2017-09-14 ENCOUNTER — Telehealth: Payer: Self-pay

## 2017-09-14 MED ORDER — PANTOPRAZOLE SODIUM 40 MG PO TBEC: 40.0000 mg | DELAYED_RELEASE_TABLET | Freq: Every day | ORAL | 0 refills | Status: DC

## 2017-09-14 MED ORDER — MONTELUKAST SODIUM 10 MG PO TABS: 10.0000 mg | ORAL_TABLET | Freq: Every day | ORAL | 0 refills | Status: DC

## 2017-09-14 MED ORDER — LEVOTHYROXINE SODIUM 100 MCG PO TABS: 100.0000 ug | ORAL_TABLET | Freq: Every day | ORAL | 0 refills | Status: DC

## 2017-09-14 NOTE — Telephone Encounter (Signed)
Sheryl [name]-- pt last seen by you in 04/2017 and has no future appts scheduled with you. When is she due for follow up?  Received fax from [place] requesting refills of pantopraozle, montelukast and levothyroxine. Refills sent.

## 2017-09-14 NOTE — Telephone Encounter (Signed)
ok 

## 2017-09-14 NOTE — Telephone Encounter (Signed)
The insurance denied coverage for Trelegy was denied. She would like to start the appeal for this medication. She states this inhaler is working really well. She used Advair and Symbicort did not work as good as Health visitor. She is exercising and this was impossible to do when she was using the Symbicort and Advair. RA ok to start appeal?

## 2017-09-15 ENCOUNTER — Other Ambulatory Visit: Payer: Self-pay | Admitting: Pulmonary Disease

## 2017-09-15 NOTE — Telephone Encounter (Signed)
Will close this encounter since there is already an encounter open for the same topic.

## 2017-09-15 NOTE — Telephone Encounter (Signed)
Cherina here is the information to send the appeal  [facility] [employer] [facility] (PDP) [facility] [address] [address], [place], [place] [address] Phone: [phone] Fax: [phone]

## 2017-09-17 NOTE — Telephone Encounter (Signed)
Appeal letter has been faxed.

## 2017-09-23 MED ORDER — FLUTICASONE-UMECLIDIN-VILANT 100-62.5-25 MCG/INH IN AEPB: 1.0000 | INHALATION_SPRAY | Freq: Every day | RESPIRATORY_TRACT | 0 refills | Status: DC

## 2017-09-23 NOTE — Telephone Encounter (Signed)
Spoke with patient. She stated that she had received a denial letter stating that the Trelegy had been denied. Advised patient that I would leave samples at the [facility] office. I also have started patient assistance for Trelegy since it is working well for her.

## 2017-09-23 NOTE — Telephone Encounter (Signed)
Pt is calling back to check the status of this appeal. She is almost out of medications and requesting samples in [place]. Cb is [phone].

## 2017-09-30 NOTE — Telephone Encounter (Signed)
Patient calling and states has completed [place] paperwork and wants to know if she is to return this to [place] or to [place], [place] is [phone]

## 2017-09-30 NOTE — Telephone Encounter (Signed)
Patient is bringing paperwork for [place] to sign before faxing. Nothing further needed.

## 2017-10-02 ENCOUNTER — Other Ambulatory Visit: Payer: Self-pay

## 2017-10-02 MED ORDER — FLUTICASONE-UMECLIDIN-VILANT 100-62.5-25 MCG/INH IN AEPB: 1.0000 | INHALATION_SPRAY | Freq: Every day | RESPIRATORY_TRACT | 3 refills | Status: DC

## 2017-10-02 NOTE — Telephone Encounter (Signed)
[place] forms have been faxed. Will keep in my look-at in case they are needed for follow up.

## 2017-10-09 ENCOUNTER — Telehealth: Payer: Self-pay | Admitting: Emergency Medicine

## 2017-10-09 DIAGNOSIS — Z1239 Encounter for other screening for malignant neoplasm of breast: Secondary | ICD-10-CM

## 2017-10-09 NOTE — Telephone Encounter (Signed)
Copied from [place]. Topic: Appointment Scheduling - Prior Auth Required for Appointment >> Oct 09, 2017 12:30 PM Vernona Rieger wrote: Patient said she was told she needed a kidney test, thyroid check & wants to have a mammo done. Call back is [phone]   Route to department's PEC pool.

## 2017-10-09 NOTE — Telephone Encounter (Signed)
Copied from [place]. Topic: Appointment Scheduling - Prior Auth Required for Appointment >> Oct 09, 2017 12:30 PM Sheryl [name] wrote: Patient said she was told she needed a kidney test, thyroid check & wants to have a mammo done. Call back is [phone]   Route to department's PEC pool.

## 2017-10-09 NOTE — Addendum Note (Signed)
Addended by: Kelle Darting A on: 10/09/2017 02:41 PM   Modules accepted: Orders

## 2017-10-09 NOTE — Telephone Encounter (Signed)
Notified pt and she voices understanding. Follow up scheduled for 11/03/17 at 10:40am and pt was transferred to radiology to schedule mammogram. Order entered.

## 2017-10-09 NOTE — Telephone Encounter (Signed)
OK to place mammo order.  Looks like she is due for follow up in the next month. Lets bring her back for OV and we can plan to complete any necessary blood work at that time.

## 2017-10-20 ENCOUNTER — Telehealth: Payer: Self-pay | Admitting: Pulmonary Disease

## 2017-10-20 MED ORDER — FLUTICASONE-UMECLIDIN-VILANT 100-62.5-25 MCG/INH IN AEPB: 1.0000 | INHALATION_SPRAY | Freq: Every day | RESPIRATORY_TRACT | 0 refills | Status: DC

## 2017-10-20 NOTE — Telephone Encounter (Signed)
Spoke with patient. She stated that she was denied for patient assistance for Trelegy because she has not spend $600 for her medications for the year 2019. Because of this, she wants to pay for her medications out of pocket for 3 months and then reapply to the program.   She wants to have the RX sent to [facility]. Advised patient that I would place a 3 month reminder on her chart and mail out a new form for her. She verbalized understanding. Nothing else needed at time of call.

## 2017-11-03 ENCOUNTER — Ambulatory Visit (HOSPITAL_BASED_OUTPATIENT_CLINIC_OR_DEPARTMENT_OTHER)
Admission: RE | Admit: 2017-11-03 | Discharge: 2017-11-03 | Disposition: A | Discharge status: Home / Self Care | Payer: Medicare Other | Source: Ambulatory Visit | Attending: Family | Admitting: Family

## 2017-11-03 ENCOUNTER — Ambulatory Visit: Payer: Medicare Other | Admitting: Family

## 2017-11-03 ENCOUNTER — Encounter: Payer: Self-pay | Admitting: Family

## 2017-11-03 DIAGNOSIS — Z1231 Encounter for screening mammogram for malignant neoplasm of breast: Secondary | ICD-10-CM | POA: Diagnosis not present

## 2017-11-03 DIAGNOSIS — Z780 Asymptomatic menopausal state: Secondary | ICD-10-CM | POA: Diagnosis not present

## 2017-11-03 DIAGNOSIS — Z1239 Encounter for other screening for malignant neoplasm of breast: Secondary | ICD-10-CM

## 2017-11-09 ENCOUNTER — Encounter: Payer: Self-pay | Admitting: Family

## 2017-11-10 ENCOUNTER — Ambulatory Visit: Payer: Medicare Other | Admitting: Family

## 2017-11-10 ENCOUNTER — Telehealth: Payer: Self-pay | Admitting: Pulmonary Disease

## 2017-11-10 MED ORDER — FLUTICASONE-UMECLIDIN-VILANT 100-62.5-25 MCG/INH IN AEPB: 1.0000 | INHALATION_SPRAY | Freq: Every day | RESPIRATORY_TRACT | 3 refills | Status: DC

## 2017-11-10 NOTE — Telephone Encounter (Signed)
Will place RX in RA's look-at folder for signature tomorrow during clinic.

## 2017-11-10 NOTE — Telephone Encounter (Signed)
Spoke with the pt I printed the [place] forms and printed rx for RA to sign  She wants the forms mailed to her to complete  I verified her address and atattached to the forms and placed in RA's to do folder (green) Cherina- can you mail to her once he has signed the rx? Thanks!

## 2017-11-11 NOTE — Telephone Encounter (Signed)
RX has been signed. Will place in the app in the mail for patient.

## 2017-11-13 ENCOUNTER — Ambulatory Visit: Payer: Medicare Other | Admitting: Family

## 2017-11-13 ENCOUNTER — Telehealth: Payer: Self-pay | Admitting: Family

## 2017-11-13 NOTE — Telephone Encounter (Signed)
Spoke with pt. Advised her this is a form letter to remind patients of our office policy regarding no show / late policy. Pt voices understanding and states she usually calls to r/s if she can't make an appointment. She states she didn't realize she was late for her last visit until she was told at the front desk when she checked in. She states she is "ok with the letter now and just misunderstood it." States she will protest if she gets a bill for that appointment because she did show up; was just late".

## 2017-11-13 NOTE — Telephone Encounter (Signed)
Patient called PEC stating she was 9 minutes late on her 11/03/2017 appt She received a $25 no show fee and letter stating she would be discharged if she missed 2 more appointments. Patient would like a call back from providers assistant with an explanation.

## 2017-11-19 ENCOUNTER — Ambulatory Visit (INDEPENDENT_AMBULATORY_CARE_PROVIDER_SITE_OTHER): Payer: Medicare Other | Admitting: Family

## 2017-11-19 ENCOUNTER — Encounter: Payer: Self-pay | Admitting: Family

## 2017-11-19 VITALS — BP 120/79 | HR 81 | Temp 98.8°F | Resp 18 | Ht 63.0 in | Wt 190.0 lb

## 2017-11-19 DIAGNOSIS — J320 Chronic maxillary sinusitis: Secondary | ICD-10-CM | POA: Diagnosis not present

## 2017-11-19 DIAGNOSIS — H6120 Impacted cerumen, unspecified ear: Secondary | ICD-10-CM

## 2017-11-19 DIAGNOSIS — E039 Hypothyroidism, unspecified: Secondary | ICD-10-CM | POA: Diagnosis not present

## 2017-11-19 DIAGNOSIS — R739 Hyperglycemia, unspecified: Secondary | ICD-10-CM | POA: Diagnosis not present

## 2017-11-19 DIAGNOSIS — K219 Gastro-esophageal reflux disease without esophagitis: Secondary | ICD-10-CM | POA: Diagnosis not present

## 2017-11-19 DIAGNOSIS — G4700 Insomnia, unspecified: Secondary | ICD-10-CM | POA: Diagnosis not present

## 2017-11-19 DIAGNOSIS — E785 Hyperlipidemia, unspecified: Secondary | ICD-10-CM

## 2017-11-19 DIAGNOSIS — J45909 Unspecified asthma, uncomplicated: Secondary | ICD-10-CM | POA: Diagnosis not present

## 2017-11-19 MED ORDER — PREDNISONE 10 MG PO TABS: ORAL_TABLET | ORAL | 0 refills | Status: DC

## 2017-11-19 MED ORDER — DOXYCYCLINE HYCLATE 100 MG PO TABS: 100.0000 mg | ORAL_TABLET | Freq: Two times a day (BID) | ORAL | 0 refills | Status: DC

## 2017-11-19 MED FILL — DOXYCYCLINE HYCLATE 100 MG: 100 | 10 days supply | Qty: 20 | Fill #0

## 2017-11-19 MED FILL — predniSONE 10 MG TABS: 10 | 8 days supply | Qty: 20 | Fill #0

## 2017-11-19 NOTE — Progress Notes (Signed)
Subjective:    Patient ID: Sheryl [name], female    DOB: May 25, 1946, 72 y.o.   MRN: [id number]  HPI  Sheryl [name] is a 72 yr old female who presents today for follow up.  Reports 2 week hx of allergy symptoms. Monday night she developed extreme weakness.  She reports + HA.  Sleep x 2 days. Just got up to drink and use the bathroom.  Yesterday she started to improve. Today she reports "bad chest congestion, productive cough of green/yellow sputum." She is using mucinex and albuterol nebs.    Asthma- continues singulair and zyrtec.   Insomnia-   Insomnia is OK, not taking any meds.    GERD-  She continues protonix and reports that it is under control    Review of Systems    see HPI  Past Medical History:  Diagnosis Date  . Anemia    pernicious  . Aortic ectasia (HCC)    infrarenal abdomina aorta 2.6 cm  . Asthma   . Colon cancer ([place]) 2012  . Diabetes mellitus    type 2  . Fatty liver 01/19/2015  . 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 History of pericarditis 07/26/2012  . Hyperlipidemia   . Pericarditis 07/26/2012  . Rheumatoid arthritis(714.0)   . Sjogren's syndrome ([place]) 03/17/2011  . Small bowel obstruction ([place]) 03/2014   ?due to adhesions from colon surgery per pt  . Thyroid disease    hypothyroid  . Tobacco abuse      Social History   Socioeconomic History  . Marital status: Single    Spouse name: Not on file  . Number of children: 1  . Years of education: Not on file  . Highest education level: Not on file  Occupational History  . Occupation: unemployed    Employer: DISABLED  Social Needs  . Financial resource strain: Not on file  . Food insecurity:    Worry: Not on file    Inability: Not on file  . Transportation needs:    Medical: Not on file    Non-medical: Not on file  Tobacco Use  . Smoking status: Former Smoker    Types: Cigarettes  . Smokeless tobacco: Never Used  . Tobacco comment: 3 cigarettes a  month. Uses smokeless cigarette.  Substance and Sexual Activity  . Alcohol use: No    Alcohol/week: 0.6 oz    Types: 1 Glasses of wine per week    Comment: Has not had alcohol since SBO 03/2014  . Drug use: Yes    Comment: Smokes "medical grade" marijuana 2 puffs 3 x weekly for rheumatoid arthritis.per pt  . Sexual activity: Never  Lifestyle  . Physical activity:    Days per week: Not on file    Minutes per session: Not on file  . Stress: Not on file  Relationships  . Social connections:    Talks on phone: Not on file    Gets together: Not on file    Attends religious service: Not on file    Active member of club or organization: Not on file    Attends meetings of clubs or organizations: Not on file    Relationship status: Not on file  . Intimate partner violence:    Fear of current or ex partner: Not on file    Emotionally abused: Not on file    Physically abused: Not on file    Forced sexual activity: Not on file  Other Topics Concern  .  Not on file  Social History Narrative   Regular exercise:  Stretching exercises. Resistance bands   Caffeine: 1 mug (2cus) daily.       Past Surgical History:  Procedure Laterality Date  . CHOLECYSTECTOMY  10/23/2016  . COLON SURGERY  8/.23/13   tumor removed from sigmoid colon.  Marland Kitchen DILATION AND CURETTAGE OF UTERUS  1975  . HERNIA REPAIR  10/23/2016  . KNEE SURGERY  2006   arthroscopic left knee  . KNEE SURGERY  2001   right knee    Family History  Problem Relation Age of Onset  . Heart disease Other        CAD  . Diabetes Other   . Hyperlipidemia Other   . Stroke Other     Allergies  Allergen Reactions  . Levofloxacin Shortness Of Breath and Rash  . Mold Extract [Trichophyton] Anaphylaxis  . Ativan [Lorazepam]     Visual hallucinations.  Visual hallucinations.   . Cefuroxime Axetil     REACTION: asthma, cough  . Hydrocodone-Homatropine Nausea Only  . Rosuvastatin Calcium     Muscle pain, syncope  . Statins      Muscle pain, syncope  . Zetia [Ezetimibe] Diarrhea  . Hydrocodone-Acetaminophen Nausea Only    Current Outpatient Medications on File Prior to Visit  Medication Sig Dispense Refill  . albuterol (VENTOLIN HFA) 108 (90 Base) MCG/ACT inhaler Inhale 2 puffs into the lungs every 6 (six) hours as needed for wheezing or shortness of breath. 54 g 1  . aspirin 81 MG tablet Take 81 mg by mouth daily.      . betamethasone dipropionate (DIPROLENE) 0.05 % cream Apply topically 2 (two) times daily. 30 g 1  . Cyanocobalamin (B-12) 1000 MCG CAPS Take 1,000 mcg by mouth daily.    . diclofenac sodium (VOLTAREN) 1 % GEL Apply 2 g topically 4 (four) times daily as needed. 100 g 2  . estradiol (ESTRACE VAGINAL) 0.1 MG/GM vaginal cream 2g daily intravaginally for 1 week, then reduce to 1 gram PV daily for 1 week , followed by a maintenance dose of 1 g PV once weekly 42.5 g 5  . fish oil-omega-3 fatty acids 1000 MG capsule Take 2 capsules (2 g total) by mouth 2 (two) times daily.    . fluticasone (FLONASE) 50 MCG/ACT nasal spray Place 2 sprays into both nostrils daily as needed. 9.9 g 5  . Fluticasone-Umeclidin-Vilant (TRELEGY ELLIPTA) 100-62.5-25 MCG/INH AEPB Inhale 1 puff into the lungs daily. 2 each 0  . Fluticasone-Umeclidin-Vilant (TRELEGY ELLIPTA) 100-62.5-25 MCG/INH AEPB Inhale 1 puff into the lungs daily. 3 each 3  . levothyroxine (SYNTHROID, LEVOTHROID) 100 MCG tablet Take 1 tablet (100 mcg total) by mouth daily. 90 tablet 0  . montelukast (SINGULAIR) 10 MG tablet Take 1 tablet (10 mg total) by mouth at bedtime. 90 tablet 0  . multivitamin (THERAGRAN) per tablet Take 1 tablet by mouth daily.      . NON FORMULARY TUMERIC MILK.    . NON FORMULARY CBD. Derivative of marijuana without the marijuana.    . nystatin (MYCOSTATIN) 100000 UNIT/ML suspension TAKE 5 ML BY MOUTH 4 TIMES A DAY (Patient taking differently: TAKE 5 ML BY MOUTH 4 TIMES A DAY as needed) 180 mL 0  . ondansetron (ZOFRAN-ODT) 8 MG disintegrating  tablet Take 1 tablet (8 mg total) by mouth every 8 (eight) hours as needed for nausea. 20 tablet 3  . pantoprazole (PROTONIX) 40 MG tablet Take 1 tablet (40 mg total) by mouth  daily. 90 tablet 0  . Probiotic Product (PROBIOTIC DAILY PO) Take 1 capsule by mouth daily.    . traZODone (DESYREL) 50 MG tablet Take 0.5-1 tablets (25-50 mg total) by mouth at bedtime as needed for sleep. 30 tablet 3  . triamcinolone cream (KENALOG) 0.1 % Apply 1 application topically 2 (two) times daily. 30 g 0  . zolpidem (AMBIEN) 5 MG tablet Take 1 tablet (5 mg total) by mouth at bedtime as needed for sleep. 30 tablet 0  . Zoster Vaccine Live, PF, (ZOSTAVAX) 60045 UNT/0.65ML injection Inject 19,400 Units into the skin once. 1 each 0   No current facility-administered medications on file prior to visit.     BP 120/79 (BP Location: Right Arm, Patient Position: Sitting, Cuff Size: Small)   Pulse 81   Temp 98.8 F (37.1 C) (Oral)   Resp 18   Ht 5\' 3"  (1.6 m)   Wt 190 lb (86.2 kg)   SpO2 96%   BMI 33.66 kg/m    Objective:   Physical Exam  Constitutional: She is oriented to person, place, and time. She appears well-developed and well-nourished.  HENT:  Head: Normocephalic and atraumatic.  Left Ear: Tympanic membrane and ear canal normal.  Mouth/Throat: No oropharyngeal exudate, posterior oropharyngeal edema or posterior oropharyngeal erythema.  R TM occluded by cerumen  Cardiovascular: Normal rate, regular rhythm and normal heart sounds.  No murmur heard. Pulmonary/Chest: Effort normal and breath sounds normal. No respiratory distress. She has no wheezes.  Musculoskeletal: She exhibits no edema.  Neurological: She is alert and oriented to person, place, and time.  Skin: Skin is warm and dry.  Psychiatric: She has a normal mood and affect. Her behavior is normal. Judgment and thought content normal.          Assessment & Plan:  Hyperglycemia- check A1c.  Hypothyroid- clinically stable on synthroid,  check follow up tsh.  Cerumen impaction- removed with irrigation by cma.   Hyperlipidemia- check follow up lipid panel.  Lab Results  Component Value Date   CHOL 189 12/19/2016   HDL 32.40 (L) 12/19/2016   LDLCALC 123 (H) 12/19/2016   TRIG 170.0 (H) 12/19/2016   CHOLHDL 6 12/19/2016   Insomnia- fair off of meds. Monitor.   GERD- stable on ppi, continue same.   Sinusitis- rx with doxycycline.   Asthma- clinically worse, though no overt wheezing today. rx provided for pred taper which she will begin if her asthma symptoms do not improve in the next few days.

## 2017-11-19 NOTE — Patient Instructions (Addendum)
Begin doxycycline for sinus infection. Continue albuterol every 6 hours as needed. Begin prednisone taper if your asthma symptoms do not improve. Call if symptoms worsen or if they fail to improve.

## 2017-11-24 ENCOUNTER — Telehealth: Payer: Self-pay | Admitting: Family

## 2017-11-24 DIAGNOSIS — E890 Postprocedural hypothyroidism: Secondary | ICD-10-CM

## 2017-11-24 DIAGNOSIS — E118 Type 2 diabetes mellitus with unspecified complications: Secondary | ICD-10-CM

## 2017-11-24 DIAGNOSIS — E7849 Other hyperlipidemia: Secondary | ICD-10-CM

## 2017-11-24 NOTE — Telephone Encounter (Signed)
Looks like patient forgot to go to lab day of her appointment. Can you please have her schedule a lab visit?

## 2017-11-25 NOTE — Telephone Encounter (Signed)
Attempted to reach pt. Received fast, busy tone. Will try again later.

## 2017-11-25 NOTE — Telephone Encounter (Signed)
Notified pt of below and scheduled lab visit for 12/04/17 at 11am.

## 2017-12-04 ENCOUNTER — Other Ambulatory Visit (INDEPENDENT_AMBULATORY_CARE_PROVIDER_SITE_OTHER): Payer: Medicare Other

## 2017-12-04 DIAGNOSIS — E890 Postprocedural hypothyroidism: Secondary | ICD-10-CM

## 2017-12-04 DIAGNOSIS — E118 Type 2 diabetes mellitus with unspecified complications: Secondary | ICD-10-CM

## 2017-12-04 DIAGNOSIS — E7849 Other hyperlipidemia: Secondary | ICD-10-CM | POA: Diagnosis not present

## 2017-12-04 LAB — HEPATIC FUNCTION PANEL
ALT: 39 U/L — ABNORMAL HIGH (ref 0–35)
AST: 43 U/L — ABNORMAL HIGH (ref 0–37)
Albumin: 3.9 g/dL (ref 3.5–5.2)
Alkaline Phosphatase: 70 U/L (ref 39–117)
BILIRUBIN DIRECT: 0.1 mg/dL (ref 0.0–0.3)
BILIRUBIN TOTAL: 0.6 mg/dL (ref 0.2–1.2)
Total Protein: 8.4 g/dL — ABNORMAL HIGH (ref 6.0–8.3)

## 2017-12-04 LAB — LIPID PANEL
CHOL/HDL RATIO: 6
Cholesterol: 186 mg/dL (ref 0–200)
HDL: 30.7 mg/dL — ABNORMAL LOW (ref >39.00)
NONHDL: 155.24
Triglycerides: 202 mg/dL — ABNORMAL HIGH (ref 0.0–149.0)
VLDL: 40.4 mg/dL — ABNORMAL HIGH (ref 0.0–40.0)

## 2017-12-04 LAB — HEMOGLOBIN A1C: Hgb A1c MFr Bld: 6.5 % (ref 4.6–6.5)

## 2017-12-04 LAB — BASIC METABOLIC PANEL
BUN: 20 mg/dL (ref 6–23)
CALCIUM: 9.4 mg/dL (ref 8.4–10.5)
CO2: 31 mEq/L (ref 19–32)
CREATININE: 0.95 mg/dL (ref 0.40–1.20)
Chloride: 98 mEq/L (ref 96–112)
GFR: 61.73 mL/min (ref >60.00)
Glucose, Bld: 120 mg/dL — ABNORMAL HIGH (ref 70–99)
Potassium: 4.8 mEq/L (ref 3.5–5.1)
Sodium: 136 mEq/L (ref 135–145)

## 2017-12-04 LAB — LDL CHOLESTEROL, DIRECT: LDL DIRECT: 129 mg/dL

## 2017-12-04 LAB — TSH: TSH: 1.67 u[IU]/mL (ref 0.35–4.50)

## 2017-12-08 ENCOUNTER — Other Ambulatory Visit: Payer: Self-pay | Admitting: Family

## 2017-12-09 ENCOUNTER — Telehealth: Payer: Self-pay

## 2017-12-09 NOTE — Telephone Encounter (Signed)
PA initiated via Covermymeds; KEY: [id number]. Awaiting determination.

## 2017-12-10 MED FILL — DICLOFENAC SODIUM 1% GEL: 1 | 13 days supply | Qty: 100 | Fill #0

## 2017-12-10 NOTE — Telephone Encounter (Signed)
PA approved. Effective 09/10/2017 to 12/09/2018.

## 2018-01-04 MED FILL — CHLORHEXIDINE 0.12% RINSE: 0.12 | 32 days supply | Qty: 473 | Fill #0

## 2018-01-12 ENCOUNTER — Telehealth: Payer: Self-pay | Admitting: *Deleted

## 2018-01-12 MED ORDER — MONTELUKAST SODIUM 10 MG PO TABS: 10.0000 mg | ORAL_TABLET | Freq: Every day | ORAL | 1 refills | Status: DC

## 2018-01-12 MED ORDER — LEVOTHYROXINE SODIUM 100 MCG PO TABS: 100.0000 ug | ORAL_TABLET | Freq: Every day | ORAL | 1 refills | Status: DC

## 2018-01-12 MED ORDER — PANTOPRAZOLE SODIUM 40 MG PO TBEC: 40.0000 mg | DELAYED_RELEASE_TABLET | Freq: Every day | ORAL | 1 refills | Status: DC

## 2018-01-12 NOTE — Telephone Encounter (Signed)
Received faxes from [facility] for: levothyroxine, montelukast and pantoprazole and refills sent.. Trelegy denial faxed back with note to send to pulmonology.

## 2018-01-13 ENCOUNTER — Ambulatory Visit (INDEPENDENT_AMBULATORY_CARE_PROVIDER_SITE_OTHER): Payer: Medicare Other | Admitting: Medical

## 2018-01-13 ENCOUNTER — Encounter: Payer: Self-pay | Admitting: Medical

## 2018-01-13 VITALS — BP 146/82 | HR 74 | Temp 98.0°F | Resp 16 | Ht 63.0 in | Wt 189.0 lb

## 2018-01-13 DIAGNOSIS — L409 Psoriasis, unspecified: Secondary | ICD-10-CM

## 2018-01-13 MED ORDER — MUPIROCIN 2 % EX OINT: TOPICAL_OINTMENT | CUTANEOUS | 0 refills | Status: DC

## 2018-01-13 MED ORDER — PREDNISONE 10 MG PO TABS: ORAL_TABLET | ORAL | 0 refills | Status: DC

## 2018-01-13 MED ORDER — TRIAMCINOLONE ACETONIDE 0.1 % EX CREA: 1.0000 "application " | TOPICAL_CREAM | Freq: Two times a day (BID) | CUTANEOUS | 0 refills | Status: DC

## 2018-01-13 MED FILL — TRIAMCINOLONE 0.1% CREAM: 0.1 | 30 days supply | Qty: 30 | Fill #0

## 2018-01-13 MED FILL — predniSONE 10 MG TABS: 10 | 4 days supply | Qty: 10 | Fill #0

## 2018-01-13 MED FILL — MUPIROCIN 2% OINTMENT: 2 | 30 days supply | Qty: 22 | Fill #0

## 2018-01-13 NOTE — Patient Instructions (Addendum)
For your flare of psoriasis and hx of RA in hands, I am prescribing 4 day taper dose of prednisone. While on taper dose recommend you eat low sugar diet.  Rx of kenalog cream as well. You could spot apply this in about 5 days.  Can apply mupirocin to cracked areas.  Pharmacy ordered your A&D cream today and can pick it up at noon tomorrow.  Will refer you to new dermatologist.  Follow up in 10 days or as needed

## 2018-01-13 NOTE — Progress Notes (Signed)
Subjective:    Patient ID: Sheryl [name], female    DOB: 06/29/1946, 72 y.o.   MRN: [id number]  HPI  Pt in with recent redness, inflamed, aching hands with cracked skin. Her condition seemed to flare after excessive work with mulching, pulled weeds, spread dirt and stained back porch just recently over the weekend.  She has RA as well.  Prednisone 20 mg q day the last 2 days helped a lot. She had some residual tabs at home. Helped alot  Pt also using hydropro.(she is putting this on 4-5 times a day)  Prior used AD cream which she got downstairs but they don't carry this anymore.(but called pharmacy downstairs and they reordered it or her).   .  Review of Systems  Constitutional: Negative for chills, fatigue and fever.  Respiratory: Negative for cough, chest tightness, shortness of breath and wheezing.   Cardiovascular: Negative for chest pain and palpitations.  Gastrointestinal: Negative for abdominal pain.  Musculoskeletal:       See hpi.  Skin:       See hpi.  Neurological: Negative for dizziness and headaches.  Hematological: Negative for adenopathy. Does not bruise/bleed easily.  Psychiatric/Behavioral: Negative for behavioral problems and confusion.   Past Medical History:  Diagnosis Date  . Anemia    pernicious  . Aortic ectasia (HCC)    infrarenal abdomina aorta 2.6 cm  . Asthma   . Colon cancer ([place]) 2012  . Diabetes mellitus    type 2  . Fatty liver 01/19/2015  . 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 History of pericarditis 07/26/2012  . Hyperlipidemia   . Pericarditis 07/26/2012  . Rheumatoid arthritis(714.0)   . Sjogren's syndrome ([place]) 03/17/2011  . Small bowel obstruction ([place]) 03/2014   ?due to adhesions from colon surgery per pt  . Thyroid disease    hypothyroid  . Tobacco abuse      Social History   Socioeconomic History  . Marital status: Single    Spouse name: Not on file  . Number of children: 1  .  Years of education: Not on file  . Highest education level: Not on file  Occupational History  . Occupation: unemployed    Employer: DISABLED  Social Needs  . Financial resource strain: Not on file  . Food insecurity:    Worry: Not on file    Inability: Not on file  . Transportation needs:    Medical: Not on file    Non-medical: Not on file  Tobacco Use  . Smoking status: Former Smoker    Types: Cigarettes  . Smokeless tobacco: Never Used  . Tobacco comment: 3 cigarettes a month. Uses smokeless cigarette.  Substance and Sexual Activity  . Alcohol use: No    Alcohol/week: 0.6 oz    Types: 1 Glasses of wine per week    Comment: Has not had alcohol since SBO 03/2014  . Drug use: Yes    Comment: Smokes "medical grade" marijuana 2 puffs 3 x weekly for rheumatoid arthritis.per pt  . Sexual activity: Never  Lifestyle  . Physical activity:    Days per week: Not on file    Minutes per session: Not on file  . Stress: Not on file  Relationships  . Social connections:    Talks on phone: Not on file    Gets together: Not on file    Attends religious service: Not on file    Active member of club or organization: Not  on file    Attends meetings of clubs or organizations: Not on file    Relationship status: Not on file  . Intimate partner violence:    Fear of current or ex partner: Not on file    Emotionally abused: Not on file    Physically abused: Not on file    Forced sexual activity: Not on file  Other Topics Concern  . Not on file  Social History Narrative   Regular exercise:  Stretching exercises. Resistance bands   Caffeine: 1 mug (2cus) daily.       Past Surgical History:  Procedure Laterality Date  . CHOLECYSTECTOMY  10/23/2016  . COLON SURGERY  8/.23/13   tumor removed from sigmoid colon.  Marland Kitchen DILATION AND CURETTAGE OF UTERUS  1975  . HERNIA REPAIR  10/23/2016  . KNEE SURGERY  2006   arthroscopic left knee  . KNEE SURGERY  2001   right knee    Family History    Problem Relation Age of Onset  . Heart disease Other        CAD  . Diabetes Other   . Hyperlipidemia Other   . Stroke Other     Allergies  Allergen Reactions  . Levofloxacin Shortness Of Breath and Rash  . Mold Extract [Trichophyton] Anaphylaxis  . Ativan [Lorazepam]     Visual hallucinations.  Visual hallucinations.   . Cefuroxime Axetil     REACTION: asthma, cough  . Hydrocodone-Homatropine Nausea Only  . Rosuvastatin Calcium     Muscle pain, syncope  . Statins     Muscle pain, syncope  . Zetia [Ezetimibe] Diarrhea  . Hydrocodone-Acetaminophen Nausea Only    Current Outpatient Medications on File Prior to Visit  Medication Sig Dispense Refill  . albuterol (VENTOLIN HFA) 108 (90 Base) MCG/ACT inhaler Inhale 2 puffs into the lungs every 6 (six) hours as needed for wheezing or shortness of breath. 54 g 1  . aspirin 81 MG tablet Take 81 mg by mouth daily.      . betamethasone dipropionate (DIPROLENE) 0.05 % cream Apply topically 2 (two) times daily. 30 g 1  . Cyanocobalamin (B-12) 1000 MCG CAPS Take 1,000 mcg by mouth daily.    Marland Kitchen doxycycline (VIBRA-TABS) 100 MG tablet Take 1 tablet (100 mg total) by mouth 2 (two) times daily. 20 tablet 0  . estradiol (ESTRACE VAGINAL) 0.1 MG/GM vaginal cream 2g daily intravaginally for 1 week, then reduce to 1 gram PV daily for 1 week , followed by a maintenance dose of 1 g PV once weekly 42.5 g 5  . fish oil-omega-3 fatty acids 1000 MG capsule Take 2 capsules (2 g total) by mouth 2 (two) times daily.    . fluticasone (FLONASE) 50 MCG/ACT nasal spray Place 2 sprays into both nostrils daily as needed. 9.9 g 5  . Fluticasone-Umeclidin-Vilant (TRELEGY ELLIPTA) 100-62.5-25 MCG/INH AEPB Inhale 1 puff into the lungs daily. 2 each 0  . Fluticasone-Umeclidin-Vilant (TRELEGY ELLIPTA) 100-62.5-25 MCG/INH AEPB Inhale 1 puff into the lungs daily. 3 each 3  . levothyroxine (SYNTHROID, LEVOTHROID) 100 MCG tablet Take 1 tablet (100 mcg total) by mouth daily.  90 tablet 1  . montelukast (SINGULAIR) 10 MG tablet Take 1 tablet (10 mg total) by mouth at bedtime. 90 tablet 1  . multivitamin (THERAGRAN) per tablet Take 1 tablet by mouth daily.      . NON FORMULARY TUMERIC MILK.    . NON FORMULARY CBD. Derivative of marijuana without the marijuana.    . nystatin (  MYCOSTATIN) 100000 UNIT/ML suspension TAKE 5 ML BY MOUTH 4 TIMES A DAY (Patient taking differently: TAKE 5 ML BY MOUTH 4 TIMES A DAY as needed) 180 mL 0  . ondansetron (ZOFRAN-ODT) 8 MG disintegrating tablet Take 1 tablet (8 mg total) by mouth every 8 (eight) hours as needed for nausea. 20 tablet 3  . pantoprazole (PROTONIX) 40 MG tablet Take 1 tablet (40 mg total) by mouth daily. 90 tablet 1  . Probiotic Product (PROBIOTIC DAILY PO) Take 1 capsule by mouth daily.    . VOLTAREN 1 % GEL APPLY 2 GRAMS TOPICALLY 4 TIMES AS NEEDED 100 g 2  . Zoster Vaccine Live, PF, (ZOSTAVAX) 49179 UNT/0.65ML injection Inject 19,400 Units into the skin once. 1 each 0   No current facility-administered medications on file prior to visit.     BP (!) 146/82   Pulse 74   Temp 98 F (36.7 C) (Oral)   Resp 16   Ht 5\' 3"  (1.6 m)   Wt 189 lb (85.7 kg)   SpO2 98%   BMI 33.48 kg/m       Objective:   Physical Exam  General- No acute distress. Pleasant patient. Neck- Full range of motion, no jvd Lungs- Clear, even and unlabored. Heart- regular rate and rhythm. Neurologic- CNII- XII grossly intact.  Bilateral hands- she has mild inflamed joints of hand. Mild redness to hands. Dry flaky skin with scattered cracks.       Assessment & Plan:  For your flare of psoriasis and hx of RA in hands, I am prescribing 4 day taper dose of prednisone. While on taper dose recommend you eat low sugar diet.  Rx of kenalog cream as well. You could spot apply this in about 5 days.  Can apply mupirocin to cracked areas.  Pharmacy  ordered your A&D cream today and can pick it up at noon tomorrow.  Will refer you to new  dermatologist.  Follow up in 10 days or as needed  [employer], [employer]

## 2018-01-27 ENCOUNTER — Telehealth: Payer: Self-pay | Admitting: Adult Health

## 2018-01-28 ENCOUNTER — Other Ambulatory Visit: Payer: Self-pay

## 2018-01-28 MED ORDER — FLUTICASONE-UMECLIDIN-VILANT 100-62.5-25 MCG/INH IN AEPB: 1.0000 | INHALATION_SPRAY | Freq: Every day | RESPIRATORY_TRACT | 3 refills | Status: DC

## 2018-01-28 NOTE — Telephone Encounter (Signed)
Left voice mail on machine for patient to return phone call back regarding Trelegy has been sent to her mail delivery for 60mo supply as requested today. X1

## 2018-02-02 NOTE — Telephone Encounter (Signed)
Refills already sent in another encounter

## 2018-02-19 MED FILL — CHLORHEXIDINE 0.12% RINSE: 0.12 | 32 days supply | Qty: 473 | Fill #1

## 2018-05-19 NOTE — Progress Notes (Signed)
Subjective:   Sheryl [name] is a 72 y.o. female who presents for Medicare Annual (Subsequent) preventive examination.  Review of Systems: No ROS.  Medicare Wellness Visit. Additional risk factors are reflected in the social history. Cardiac Risk Factors include: advanced age (>41men, >26 women);dyslipidemia Home Safety/Smoke Alarms: Feels safe in home. Smoke alarms in place.  Lives alone in 1 story.Has alarm system.  Female:   Mammo- utd       Dexa scan- utd       CCS-last 01/02/15 Eye-yearly. UTD per pt.    Objective:     Vitals: BP (!) 144/86 (BP Location: Left Arm, Patient Position: Sitting, Cuff Size: Normal)   Pulse 82   Ht 5\' 3"  (1.6 m)   Wt 187 lb 12.8 oz (85.2 kg)   SpO2 97%   BMI 33.27 kg/m   Body mass index is 33.27 kg/m.  Advanced Directives 05/20/2018 05/19/2017 09/20/2015  Does Patient Have a Medical Advance Directive? Yes Yes Yes  Type of Paramedic of [place];Living will Living will [place];Living will  Does patient want to make changes to medical advance directive? - No - Patient declined -  Copy of [place] in Chart? No - copy requested - -    Tobacco Social History   Tobacco Use  Smoking Status Former Smoker  . Types: Cigarettes  Smokeless Tobacco Never Used  Tobacco Comment   3 cigarettes a month. Uses smokeless cigarette.     Counseling given: Not Answered Comment: 3 cigarettes a month. Uses smokeless cigarette.   Clinical Intake:  Pain : No/denies pain     Past Medical History:  Diagnosis Date  . Anemia    pernicious  . Aortic ectasia (HCC)    infrarenal abdomina aorta 2.6 cm  . Asthma   . Colon cancer ([place]) 2012  . Diabetes mellitus    type 2  . Fatty liver 01/19/2015  . 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 History of pericarditis 07/26/2012  . Hyperlipidemia   . Pericarditis 07/26/2012  . Rheumatoid arthritis(714.0)   .  Sjogren's syndrome ([place]) 03/17/2011  . Small bowel obstruction ([place]) 03/2014   ?due to adhesions from colon surgery per pt  . Thyroid disease    hypothyroid  . Tobacco abuse    Past Surgical History:  Procedure Laterality Date  . CHOLECYSTECTOMY  10/23/2016  . COLON SURGERY  8/.23/13   tumor removed from sigmoid colon.  Marland Kitchen DILATION AND CURETTAGE OF UTERUS  1975  . HERNIA REPAIR  10/23/2016  . KNEE SURGERY  2006   arthroscopic left knee  . KNEE SURGERY  2001   right knee   Family History  Problem Relation Age of Onset  . Heart disease Other        CAD  . Diabetes Other   . Hyperlipidemia Other   . Stroke Other    Social History   Socioeconomic History  . Marital status: Single    Spouse name: Not on file  . Number of children: 1  . Years of education: Not on file  . Highest education level: Not on file  Occupational History  . Occupation: unemployed    Employer: DISABLED  Social Needs  . Financial resource strain: Not on file  . Food insecurity:    Worry: Not on file    Inability: Not on file  . Transportation needs:    Medical: Not on file  Non-medical: Not on file  Tobacco Use  . Smoking status: Former Smoker    Types: Cigarettes  . Smokeless tobacco: Never Used  . Tobacco comment: 3 cigarettes a month. Uses smokeless cigarette.  Substance and Sexual Activity  . Alcohol use: No    Alcohol/week: 1.0 standard drinks    Types: 1 Glasses of wine per week    Comment: Has not had alcohol since SBO 03/2014  . Drug use: Yes    Comment: Smokes "medical grade" marijuana 2 puffs 3 x weekly for rheumatoid arthritis.per pt  . Sexual activity: Never  Lifestyle  . Physical activity:    Days per week: Not on file    Minutes per session: Not on file  . Stress: Not on file  Relationships  . Social connections:    Talks on phone: Not on file    Gets together: Not on file    Attends religious service: Not on file    Active member of club or organization: Not on file     Attends meetings of clubs or organizations: Not on file    Relationship status: Not on file  Other Topics Concern  . Not on file  Social History Narrative   Regular exercise:  Stretching exercises. Resistance bands   Caffeine: 1 mug (2cus) daily.       Outpatient Encounter Medications as of 05/20/2018  Medication Sig  . albuterol (VENTOLIN HFA) 108 (90 Base) MCG/ACT inhaler Inhale 2 puffs into the lungs every 6 (six) hours as needed for wheezing or shortness of breath.  Marland Kitchen aspirin 81 MG tablet Take 81 mg by mouth daily.    . betamethasone dipropionate (DIPROLENE) 0.05 % cream Apply topically 2 (two) times daily.  . Cyanocobalamin (B-12) 1000 MCG CAPS Take 1,000 mcg by mouth daily.  Marland Kitchen estradiol (ESTRACE VAGINAL) 0.1 MG/GM vaginal cream 2g daily intravaginally for 1 week, then reduce to 1 gram PV daily for 1 week , followed by a maintenance dose of 1 g PV once weekly  . fluticasone (FLONASE) 50 MCG/ACT nasal spray Place 2 sprays into both nostrils daily as needed.  . Fluticasone-Umeclidin-Vilant (TRELEGY ELLIPTA) 100-62.5-25 MCG/INH AEPB Inhale 1 puff into the lungs daily.  Marland Kitchen levothyroxine (SYNTHROID, LEVOTHROID) 100 MCG tablet Take 1 tablet (100 mcg total) by mouth daily.  . montelukast (SINGULAIR) 10 MG tablet Take 1 tablet (10 mg total) by mouth at bedtime.  . multivitamin (THERAGRAN) per tablet Take 1 tablet by mouth daily.    . mupirocin ointment (BACTROBAN) 2 % Apply to area twice daily  . NON FORMULARY TUMERIC MILK.  . NON FORMULARY CBD. Derivative of marijuana without the marijuana.  . nystatin (MYCOSTATIN) 100000 UNIT/ML suspension TAKE 5 ML BY MOUTH 4 TIMES A DAY (Patient taking differently: TAKE 5 ML BY MOUTH 4 TIMES A DAY as needed)  . ondansetron (ZOFRAN-ODT) 8 MG disintegrating tablet Take 1 tablet (8 mg total) by mouth every 8 (eight) hours as needed for nausea.  . pantoprazole (PROTONIX) 40 MG tablet Take 1 tablet (40 mg total) by mouth daily.  . Probiotic Product (PROBIOTIC  DAILY PO) Take 1 capsule by mouth daily.  Marland Kitchen triamcinolone cream (KENALOG) 0.1 % Apply 1 application topically 2 (two) times daily.  . VOLTAREN 1 % GEL APPLY 2 GRAMS TOPICALLY 4 TIMES AS NEEDED  . Zoster Vaccine Live, PF, (ZOSTAVAX) 74163 UNT/0.65ML injection Inject 19,400 Units into the skin once.  . [DISCONTINUED] doxycycline (VIBRA-TABS) 100 MG tablet Take 1 tablet (100 mg total) by mouth  2 (two) times daily.  . [DISCONTINUED] fish oil-omega-3 fatty acids 1000 MG capsule Take 2 capsules (2 g total) by mouth 2 (two) times daily.  . [DISCONTINUED] Fluticasone-Umeclidin-Vilant (TRELEGY ELLIPTA) 100-62.5-25 MCG/INH AEPB Inhale 1 puff into the lungs daily.  . [DISCONTINUED] predniSONE (DELTASONE) 10 MG tablet 4 tab po day 1, 3 tab po day 2, 2 tab po day 3, 1 tab po day 4   No facility-administered encounter medications on file as of 05/20/2018.     Activities of Daily Living In your present state of health, do you have any difficulty performing the following activities: 05/20/2018  Hearing? N  Vision? N  Difficulty concentrating or making decisions? N  Walking or climbing stairs? N  Dressing or bathing? N  Preparing Food and eating ? N  Using the Toilet? N  In the past six months, have you accidently leaked urine? N  Do you have problems with loss of bowel control? N  Managing your Medications? N  Managing your Finances? N  Housekeeping or managing your Housekeeping? N  Some recent data might be hidden    Patient Care Team: [name], NP as PCP - General Kerin Salen, DDS as Consulting Physician ([place]) [place] as Consulting Physician (Ophthalmology) Lake Bells., MD as Consulting Physician (Gastroenterology) Brooks Sailors, MD as Consulting Physician (Surgery) Zebedee Iba., MD as Consulting Physician (Hematology and Oncology)    Assessment:   This is a routine wellness examination for Yadhira. Physical assessment deferred to  PCP.  Exercise Activities and Dietary recommendations Current Exercise Habits: Home exercise routine, Type of exercise: walking, Time (Minutes): 10, Frequency (Times/Week): 3, Weekly Exercise (Minutes/Week): 30, Intensity: Mild Diet (meal preparation, eat out, water intake, caffeinated beverages, dairy products, fruits and vegetables): well balanced     Goals    . Exercise 3x per week (15 min per time)     Increase walking as tolerated. Continue chair exercises and increase as tolerated.    Marland Kitchen HEMOGLOBIN A1C < 7.0       Fall Risk Fall Risk  05/20/2018 07/30/2017 05/19/2017 03/03/2016 01/08/2015  Falls in the past year? No No No No No  Comment - Emmi Telephone Survey: data to providers prior to load - - -   I  Depression Screen PHQ 2/9 Scores 05/20/2018 05/19/2017 03/03/2016 01/08/2015  PHQ - 2 Score 0 0 0 1     Cognitive Function Ad8 score reviewed for issues:  Issues making decisions:no  Less interest in hobbies / activities:no  Repeats questions, stories (family complaining):no  Trouble using ordinary gadgets (microwave, computer, phone):no  Forgets the month or year: no  Mismanaging finances: no  Remembering appts:no  Daily problems with thinking and/or memory:no Ad8 score is=0        Immunization History  Administered Date(s) Administered  . Influenza Split 07/07/2011  . Influenza Whole 06/05/2009, 04/24/2010  . Influenza, High Dose Seasonal PF 06/29/2015, 04/30/2016, 05/19/2017  . Influenza,inj,Quad PF,6+ Mos 06/01/2013, 05/25/2014  . Pneumococcal Conjugate-13 06/01/2013  . Pneumococcal Polysaccharide-23 10/01/2010, 09/12/2016  . Td 08/25/2002  . Tdap 02/14/2013   Screening Tests Health Maintenance  Topic Date Due  . URINE MICROALBUMIN  08/02/2016  . OPHTHALMOLOGY EXAM  12/23/2016  . FOOT EXAM  09/12/2017  . INFLUENZA VACCINE  03/25/2018  . HEMOGLOBIN A1C  06/05/2018  . MAMMOGRAM  11/04/2019  . TETANUS/TDAP  02/15/2023  . COLONOSCOPY  01/01/2025  .  DEXA SCAN  Completed  . Hepatitis C Screening  Completed  .  PNA vac Low Risk Adult  Completed      Plan:    Please schedule your next medicare wellness visit with me in 1 yr.  Bring a copy of your living will and/or healthcare power of attorney to your next office visit.  Continue to eat heart healthy diet (full of fruits, vegetables, whole grains, lean protein, water--limit salt, fat, and sugar intake) and increase physical activity as tolerated.   I have personally reviewed and noted the following in the patient's chart:   . Medical and social history . Use of alcohol, tobacco or illicit drugs  . Current medications and supplements . Functional ability and status . Nutritional status . Physical activity . Advanced directives . List of other physicians . Hospitalizations, surgeries, and ER visits in previous 12 months . Vitals . Screenings to include cognitive, depression, and falls . Referrals and appointments  In addition, I have reviewed and discussed with patient certain preventive protocols, quality metrics, and best practice recommendations. A written personalized care plan for preventive services as well as general preventive health recommendations were provided to patient.     Shela Nevin, [place]  05/20/2018

## 2018-05-20 ENCOUNTER — Encounter: Payer: Self-pay | Admitting: *Deleted

## 2018-05-20 ENCOUNTER — Ambulatory Visit (INDEPENDENT_AMBULATORY_CARE_PROVIDER_SITE_OTHER): Payer: Medicare Other | Admitting: *Deleted

## 2018-05-20 VITALS — BP 144/86 | HR 82 | Ht 63.0 in | Wt 187.8 lb

## 2018-05-20 DIAGNOSIS — Z0000 Encounter for general adult medical examination without abnormal findings: Secondary | ICD-10-CM | POA: Diagnosis not present

## 2018-05-20 NOTE — Progress Notes (Signed)
I have reviewed the above MWE note by Ms. Britt and agree with her documentation 

## 2018-05-20 NOTE — Patient Instructions (Addendum)
Please schedule your next medicare wellness visit with me in 1 yr.  Bring a copy of your living will and/or healthcare power of attorney to your next office visit.  Continue to eat heart healthy diet (full of fruits, vegetables, whole grains, lean protein, water--limit salt, fat, and sugar intake) and increase physical activity as tolerated.   Sheryl [name] , Thank you for taking time to come for your Medicare Wellness Visit. I appreciate your ongoing commitment to your health goals. Please review the following plan we discussed and let me know if I can assist you in the future.   These are the goals we discussed: Goals    . Exercise 3x per week (15 min per time)     Increase walking as tolerated. Continue chair exercises and increase as tolerated.       This is a list of the screening recommended for you and due dates:  Health Maintenance  Topic Date Due  . Urine Protein Check  08/02/2016  . Eye exam for diabetics  12/23/2016  . Complete foot exam   09/12/2017  . Flu Shot  03/25/2018  . Hemoglobin A1C  06/05/2018  . Mammogram  11/04/2019  . Tetanus Vaccine  02/15/2023  . Colon Cancer Screening  01/01/2025  . DEXA scan (bone density measurement)  Completed  .  Hepatitis C: One time screening is recommended by Center for Disease Control  (CDC) for  adults born from [birth year] through 1964.   Completed  . Pneumonia vaccines  Completed    Health Maintenance for Postmenopausal Women Menopause is a normal process in which your reproductive ability comes to an end. This process happens gradually over a span of months to years, usually between the ages of 46 and 54. Menopause is complete when you have missed 12 consecutive menstrual periods. It is important to talk with your health care provider about some of the most common conditions that affect postmenopausal women, such as heart disease, cancer, and bone loss (osteoporosis). Adopting a healthy lifestyle and getting preventive care can  help to promote your health and wellness. Those actions can also lower your chances of developing some of these common conditions. What should I know about menopause? During menopause, you may experience a number of symptoms, such as:  Moderate-to-severe hot flashes.  Night sweats.  Decrease in sex drive.  Mood swings.  Headaches.  Tiredness.  Irritability.  Memory problems.  Insomnia.  Choosing to treat or not to treat menopausal changes is an individual decision that you make with your health care provider. What should I know about hormone replacement therapy and supplements? Hormone therapy products are effective for treating symptoms that are associated with menopause, such as hot flashes and night sweats. Hormone replacement carries certain risks, especially as you become older. If you are thinking about using estrogen or estrogen with progestin treatments, discuss the benefits and risks with your health care provider. What should I know about heart disease and stroke? Heart disease, heart attack, and stroke become more likely as you age. This may be due, in part, to the hormonal changes that your body experiences during menopause. These can affect how your body processes dietary fats, triglycerides, and cholesterol. Heart attack and stroke are both medical emergencies. There are many things that you can do to help prevent heart disease and stroke:  Have your blood pressure checked at least every 1-2 years. High blood pressure causes heart disease and increases the risk of stroke.  If you are 55-79  years old, ask your health care provider if you should take aspirin to prevent a heart attack or a stroke.  Do not use any tobacco products, including cigarettes, chewing tobacco, or electronic cigarettes. If you need help quitting, ask your health care provider.  It is important to eat a healthy diet and maintain a healthy weight. ? Be sure to include plenty of vegetables, fruits,  low-fat dairy products, and lean protein. ? Avoid eating foods that are high in solid fats, added sugars, or salt (sodium).  Get regular exercise. This is one of the most important things that you can do for your health. ? Try to exercise for at least 150 minutes each week. The type of exercise that you do should increase your heart rate and make you sweat. This is known as moderate-intensity exercise. ? Try to do strengthening exercises at least twice each week. Do these in addition to the moderate-intensity exercise.  Know your numbers.Ask your health care provider to check your cholesterol and your blood glucose. Continue to have your blood tested as directed by your health care provider.  What should I know about cancer screening? There are several types of cancer. Take the following steps to reduce your risk and to catch any cancer development as early as possible. Breast Cancer  Practice breast self-awareness. ? This means understanding how your breasts normally appear and feel. ? It also means doing regular breast self-exams. Let your health care provider know about any changes, no matter how small.  If you are 66 or older, have a clinician do a breast exam (clinical breast exam or CBE) every year. Depending on your age, family history, and medical history, it may be recommended that you also have a yearly breast X-ray (mammogram).  If you have a family history of breast cancer, talk with your health care provider about genetic screening.  If you are at high risk for breast cancer, talk with your health care provider about having an MRI and a mammogram every year.  Breast cancer (BRCA) gene test is recommended for women who have family members with BRCA-related cancers. Results of the assessment will determine the need for genetic counseling and BRCA1 and for BRCA2 testing. BRCA-related cancers include these types: ? Breast. This occurs in males or females. ? Ovarian. ? Tubal. This  may also be called fallopian tube cancer. ? Cancer of the abdominal or pelvic lining (peritoneal cancer). ? Prostate. ? Pancreatic.  Cervical, Uterine, and Ovarian Cancer Your health care provider may recommend that you be screened regularly for cancer of the pelvic organs. These include your ovaries, uterus, and vagina. This screening involves a pelvic exam, which includes checking for microscopic changes to the surface of your cervix (Pap test).  For women ages 21-65, health care providers may recommend a pelvic exam and a Pap test every three years. For women ages 33-65, they may recommend the Pap test and pelvic exam, combined with testing for human papilloma virus (HPV), every five years. Some types of HPV increase your risk of cervical cancer. Testing for HPV may also be done on women of any age who have unclear Pap test results.  Other health care providers may not recommend any screening for nonpregnant women who are considered low risk for pelvic cancer and have no symptoms. Ask your health care provider if a screening pelvic exam is right for you.  If you have had past treatment for cervical cancer or a condition that could lead to cancer, you  need Pap tests and screening for cancer for at least 20 years after your treatment. If Pap tests have been discontinued for you, your risk factors (such as having a new sexual partner) need to be reassessed to determine if you should start having screenings again. Some women have medical problems that increase the chance of getting cervical cancer. In these cases, your health care provider may recommend that you have screening and Pap tests more often.  If you have a family history of uterine cancer or ovarian cancer, talk with your health care provider about genetic screening.  If you have vaginal bleeding after reaching menopause, tell your health care provider.  There are currently no reliable tests available to screen for ovarian cancer.  Lung  Cancer Lung cancer screening is recommended for adults 93-34 years old who are at high risk for lung cancer because of a history of smoking. A yearly low-dose CT scan of the lungs is recommended if you:  Currently smoke.  Have a history of at least 30 pack-years of smoking and you currently smoke or have quit within the past 15 years. A pack-year is smoking an average of one pack of cigarettes per day for one year.  Yearly screening should:  Continue until it has been 15 years since you quit.  Stop if you develop a health problem that would prevent you from having lung cancer treatment.  Colorectal Cancer  This type of cancer can be detected and can often be prevented.  Routine colorectal cancer screening usually begins at age 74 and continues through age 37.  If you have risk factors for colon cancer, your health care provider may recommend that you be screened at an earlier age.  If you have a family history of colorectal cancer, talk with your health care provider about genetic screening.  Your health care provider may also recommend using home test kits to check for hidden blood in your stool.  A small camera at the end of a tube can be used to examine your colon directly (sigmoidoscopy or colonoscopy). This is done to check for the earliest forms of colorectal cancer.  Direct examination of the colon should be repeated every 5-10 years until age 4. However, if early forms of precancerous polyps or small growths are found or if you have a family history or genetic risk for colorectal cancer, you may need to be screened more often.  Skin Cancer  Check your skin from head to toe regularly.  Monitor any moles. Be sure to tell your health care provider: ? About any new moles or changes in moles, especially if there is a change in a mole's shape or color. ? If you have a mole that is larger than the size of a pencil eraser.  If any of your family members has a history of skin  cancer, especially at a young age, talk with your health care provider about genetic screening.  Always use sunscreen. Apply sunscreen liberally and repeatedly throughout the day.  Whenever you are outside, protect yourself by wearing long sleeves, pants, a wide-brimmed hat, and sunglasses.  What should I know about osteoporosis? Osteoporosis is a condition in which bone destruction happens more quickly than new bone creation. After menopause, you may be at an increased risk for osteoporosis. To help prevent osteoporosis or the bone fractures that can happen because of osteoporosis, the following is recommended:  If you are 53-82 years old, get at least 1,000 mg of calcium and at least  600 mg of vitamin D per day.  If you are older than age 44 but younger than age 1, get at least 1,200 mg of calcium and at least 600 mg of vitamin D per day.  If you are older than age 54, get at least 1,200 mg of calcium and at least 800 mg of vitamin D per day.  Smoking and excessive alcohol intake increase the risk of osteoporosis. Eat foods that are rich in calcium and vitamin D, and do weight-bearing exercises several times each week as directed by your health care provider. What should I know about how menopause affects my mental health? Depression may occur at any age, but it is more common as you become older. Common symptoms of depression include:  Low or sad mood.  Changes in sleep patterns.  Changes in appetite or eating patterns.  Feeling an overall lack of motivation or enjoyment of activities that you previously enjoyed.  Frequent crying spells.  Talk with your health care provider if you think that you are experiencing depression. What should I know about immunizations? It is important that you get and maintain your immunizations. These include:  Tetanus, diphtheria, and pertussis (Tdap) booster vaccine.  Influenza every year before the flu season begins.  Pneumonia  vaccine.  Shingles vaccine.  Your health care provider may also recommend other immunizations. This information is not intended to replace advice given to you by your health care provider. Make sure you discuss any questions you have with your health care provider. Document Released: 10/03/2005 Document Revised: 02/29/2016 Document Reviewed: 05/15/2015 Elsevier Interactive Patient Education  2018 [employer].

## 2018-05-20 NOTE — Addendum Note (Signed)
Addended by: Naaman Plummer A on: 05/20/2018 11:16 AM   Modules accepted: Level of Service

## 2018-05-24 ENCOUNTER — Encounter: Payer: Self-pay | Admitting: Family

## 2018-05-24 ENCOUNTER — Ambulatory Visit (INDEPENDENT_AMBULATORY_CARE_PROVIDER_SITE_OTHER): Payer: Medicare Other | Admitting: Family

## 2018-05-24 VITALS — BP 132/78 | HR 73 | Temp 99.2°F | Resp 16 | Ht 63.0 in | Wt 188.0 lb

## 2018-05-24 DIAGNOSIS — N952 Postmenopausal atrophic vaginitis: Secondary | ICD-10-CM

## 2018-05-24 DIAGNOSIS — E119 Type 2 diabetes mellitus without complications: Secondary | ICD-10-CM

## 2018-05-24 DIAGNOSIS — R739 Hyperglycemia, unspecified: Secondary | ICD-10-CM

## 2018-05-24 DIAGNOSIS — Z23 Encounter for immunization: Secondary | ICD-10-CM | POA: Diagnosis not present

## 2018-05-24 DIAGNOSIS — E039 Hypothyroidism, unspecified: Secondary | ICD-10-CM | POA: Diagnosis not present

## 2018-05-24 DIAGNOSIS — J45909 Unspecified asthma, uncomplicated: Secondary | ICD-10-CM

## 2018-05-24 DIAGNOSIS — Z794 Long term (current) use of insulin: Secondary | ICD-10-CM | POA: Diagnosis not present

## 2018-05-24 LAB — BASIC METABOLIC PANEL
BUN: 17 mg/dL (ref 6–23)
CO2: 28 meq/L (ref 19–32)
CREATININE: 0.81 mg/dL (ref 0.40–1.20)
Calcium: 9.1 mg/dL (ref 8.4–10.5)
Chloride: 101 mEq/L (ref 96–112)
GFR: 74.09 mL/min (ref >60.00)
Glucose, Bld: 102 mg/dL — ABNORMAL HIGH (ref 70–99)
POTASSIUM: 4.5 meq/L (ref 3.5–5.1)
Sodium: 136 mEq/L (ref 135–145)

## 2018-05-24 LAB — TSH: TSH: 0.92 u[IU]/mL (ref 0.35–4.50)

## 2018-05-24 LAB — HEMOGLOBIN A1C: Hgb A1c MFr Bld: 6.4 % (ref 4.6–6.5)

## 2018-05-24 MED ORDER — LEVOTHYROXINE SODIUM 100 MCG PO TABS: 100.0000 ug | ORAL_TABLET | Freq: Every day | ORAL | 1 refills | Status: DC

## 2018-05-24 MED ORDER — BETAMETHASONE DIPROPIONATE 0.05 % EX CREA: TOPICAL_CREAM | Freq: Two times a day (BID) | CUTANEOUS | 1 refills | Status: DC

## 2018-05-24 MED ORDER — PANTOPRAZOLE SODIUM 40 MG PO TBEC: 40.0000 mg | DELAYED_RELEASE_TABLET | Freq: Every day | ORAL | 1 refills | Status: DC

## 2018-05-24 MED ORDER — MONTELUKAST SODIUM 10 MG PO TABS: 10.0000 mg | ORAL_TABLET | Freq: Every day | ORAL | 1 refills | Status: DC

## 2018-05-24 MED FILL — BETAMETHASONE DP 0.05% CRM: 0.05 | 30 days supply | Qty: 30 | Fill #0

## 2018-05-24 NOTE — Progress Notes (Signed)
Subjective:    Patient ID: Sheryl [name], female    DOB: 17-Feb-1946, 72 y.o.   MRN: [id number]  HPI   Patient is a 72 yr old female who presents today for follow up.  DM2-  Lab Results  Component Value Date   HGBA1C 6.5 12/04/2017   HGBA1C 5.9 (H) 03/20/2017   HGBA1C 6.4 12/19/2016   Lab Results  Component Value Date   MICROALBUR 5.5 (H) 08/03/2015   LDLCALC 123 (H) 12/19/2016   CREATININE 0.95 12/04/2017   Hypothyroid- continues synthroid. Does get tired sometimes but thinks its because she does not sleep well.  Lab Results  Component Value Date   TSH 1.67 12/04/2017   Continues estrace cream- sometimes uses an external steroid cream for itching.   Asthma- continues singulair, Trelegy Ellipta. Notes that she has had some sinus congestion. Sudafed helps her symptoms.   Using rescue inhaler occasionly Review of Systems See HPI  Past Medical History:  Diagnosis Date  . Anemia    pernicious  . Aortic ectasia (HCC)    infrarenal abdomina aorta 2.6 cm  . Asthma   . Colon cancer ([place]) 2012  . Diabetes mellitus    type 2  . Fatty liver 01/19/2015  . 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 History of pericarditis 07/26/2012  . Hyperlipidemia   . Pericarditis 07/26/2012  . Rheumatoid arthritis(714.0)   . Sjogren's syndrome ([place]) 03/17/2011  . Small bowel obstruction ([place]) 03/2014   ?due to adhesions from colon surgery per pt  . Thyroid disease    hypothyroid  . Tobacco abuse      Social History   Socioeconomic History  . Marital status: Single    Spouse name: Not on file  . Number of children: 1  . Years of education: Not on file  . Highest education level: Not on file  Occupational History  . Occupation: unemployed    Employer: DISABLED  Social Needs  . Financial resource strain: Not on file  . Food insecurity:    Worry: Not on file    Inability: Not on file  . Transportation needs:    Medical: Not on file   Non-medical: Not on file  Tobacco Use  . Smoking status: Current Every Day Smoker    Types: Cigarettes  . Smokeless tobacco: Never Used  . Tobacco comment: 3 cigarettes per day, Uses smokeless cigarette.  Substance and Sexual Activity  . Alcohol use: No    Alcohol/week: 1.0 standard drinks    Types: 1 Glasses of wine per week    Comment: Has not had alcohol since SBO 03/2014  . Drug use: Yes    Comment: Smokes "medical grade" marijuana 2 puffs 3 x weekly for rheumatoid arthritis.per pt  . Sexual activity: Never  Lifestyle  . Physical activity:    Days per week: Not on file    Minutes per session: Not on file  . Stress: Not on file  Relationships  . Social connections:    Talks on phone: Not on file    Gets together: Not on file    Attends religious service: Not on file    Active member of club or organization: Not on file    Attends meetings of clubs or organizations: Not on file    Relationship status: Not on file  . Intimate partner violence:    Fear of current or ex partner: Not on file    Emotionally abused: Not on file  Physically abused: Not on file    Forced sexual activity: Not on file  Other Topics Concern  . Not on file  Social History Narrative   Regular exercise:  Stretching exercises. Resistance bands   Caffeine: 1 mug (2cus) daily.       Past Surgical History:  Procedure Laterality Date  . CHOLECYSTECTOMY  10/23/2016  . COLON SURGERY  8/.23/13   tumor removed from sigmoid colon.  Marland Kitchen DILATION AND CURETTAGE OF UTERUS  1975  . HERNIA REPAIR  10/23/2016  . KNEE SURGERY  2006   arthroscopic left knee  . KNEE SURGERY  2001   right knee    Family History  Problem Relation Age of Onset  . Heart disease Other        CAD  . Diabetes Other   . Hyperlipidemia Other   . Stroke Other     Allergies  Allergen Reactions  . Levofloxacin Shortness Of Breath and Rash  . Mold Extract [Trichophyton] Anaphylaxis  . Ativan [Lorazepam]     Visual  hallucinations.  Visual hallucinations.   . Cefuroxime Axetil     REACTION: asthma, cough  . Hydrocodone-Homatropine Nausea Only  . Rosuvastatin Calcium     Muscle pain, syncope  . Statins     Muscle pain, syncope  . Zetia [Ezetimibe] Diarrhea  . Hydrocodone-Acetaminophen Nausea Only    Current Outpatient Medications on File Prior to Visit  Medication Sig Dispense Refill  . albuterol (VENTOLIN HFA) 108 (90 Base) MCG/ACT inhaler Inhale 2 puffs into the lungs every 6 (six) hours as needed for wheezing or shortness of breath. 54 g 1  . aspirin 81 MG tablet Take 81 mg by mouth daily.      . Cyanocobalamin (B-12) 1000 MCG CAPS Take 1,000 mcg by mouth daily.    Marland Kitchen estradiol (ESTRACE VAGINAL) 0.1 MG/GM vaginal cream 2g daily intravaginally for 1 week, then reduce to 1 gram PV daily for 1 week , followed by a maintenance dose of 1 g PV once weekly 42.5 g 5  . fluticasone (FLONASE) 50 MCG/ACT nasal spray Place 2 sprays into both nostrils daily as needed. 9.9 g 5  . Fluticasone-Umeclidin-Vilant (TRELEGY ELLIPTA) 100-62.5-25 MCG/INH AEPB Inhale 1 puff into the lungs daily. 3 each 3  . multivitamin (THERAGRAN) per tablet Take 1 tablet by mouth daily.      . mupirocin ointment (BACTROBAN) 2 % Apply to area twice daily 22 g 0  . NON FORMULARY TUMERIC MILK.    . NON FORMULARY CBD. Derivative of marijuana without the marijuana.    . nystatin (MYCOSTATIN) 100000 UNIT/ML suspension TAKE 5 ML BY MOUTH 4 TIMES A DAY (Patient taking differently: TAKE 5 ML BY MOUTH 4 TIMES A DAY as needed) 180 mL 0  . ondansetron (ZOFRAN-ODT) 8 MG disintegrating tablet Take 1 tablet (8 mg total) by mouth every 8 (eight) hours as needed for nausea. 20 tablet 3  . Probiotic Product (PROBIOTIC DAILY PO) Take 1 capsule by mouth daily.    Marland Kitchen triamcinolone cream (KENALOG) 0.1 % Apply 1 application topically 2 (two) times daily. 30 g 0  . VOLTAREN 1 % GEL APPLY 2 GRAMS TOPICALLY 4 TIMES AS NEEDED 100 g 2  . Zoster Vaccine Live, PF,  (ZOSTAVAX) 80321 UNT/0.65ML injection Inject 19,400 Units into the skin once. 1 each 0   No current facility-administered medications on file prior to visit.     BP 132/78 (BP Location: Left Arm, Patient Position: Sitting, Cuff Size: Small)  Pulse 73   Temp 99.2 F (37.3 C) (Oral)   Resp 16   Ht 5\' 3"  (1.6 m)   Wt 188 lb (85.3 kg)   SpO2 97%   BMI 33.30 kg/m       Objective:   Physical Exam  Constitutional: She is oriented to person, place, and time. She appears well-developed and well-nourished.  Cardiovascular: Normal rate, regular rhythm and normal heart sounds.  No murmur heard. Pulmonary/Chest: Effort normal and breath sounds normal. No respiratory distress. She has no wheezes.  Musculoskeletal: She exhibits no edema.  Neurological: She is alert and oriented to person, place, and time.  Skin: Skin is warm and dry.  Psychiatric: She has a normal mood and affect. Her behavior is normal. Judgment and thought content normal.          Assessment & Plan:  Hypothyroid- obtain follow up tsh. Continue synthroid.  DM2- diet controlled. Obtain follow up A1C.  Atrophic vaginitis- continues estrace cream.   Asthma- stable on current meds. Continue same.

## 2018-05-24 NOTE — Patient Instructions (Signed)
Please complete lab work prior to leaving.   

## 2018-06-07 ENCOUNTER — Telehealth: Payer: Self-pay | Admitting: Family

## 2018-06-07 NOTE — Telephone Encounter (Signed)
Copied from [place] [id number]. Topic: Quick Communication - See Telephone Encounter >> Jun 07, 2018 12:10 PM Vernona Rieger wrote: CRM for notification. See Telephone encounter for: 06/07/18.  Patient is calling for her labs from 9/30.

## 2018-06-07 NOTE — Telephone Encounter (Signed)
Patient advised results were mailed out to her and per [facility] they are stable.

## 2018-06-18 IMAGING — US US ABDOMEN COMPLETE
1 series · 13 of 25 positions shown · non-contrast
Comparison: CT Abdomen and Pelvis [REDACTED]
12/06/2015, and earlier

CLINICAL DATA: 70-year-old female with 2 weeks of right upper
quadrant abdominal pain. Cholelithiasis. Initial encounter.

EXAM:
ABDOMEN ULTRASOUND COMPLETE

[Series 1: us abdomen complete · 0.22mm/px · 13 of 73 slices shown]
[im 1/73]
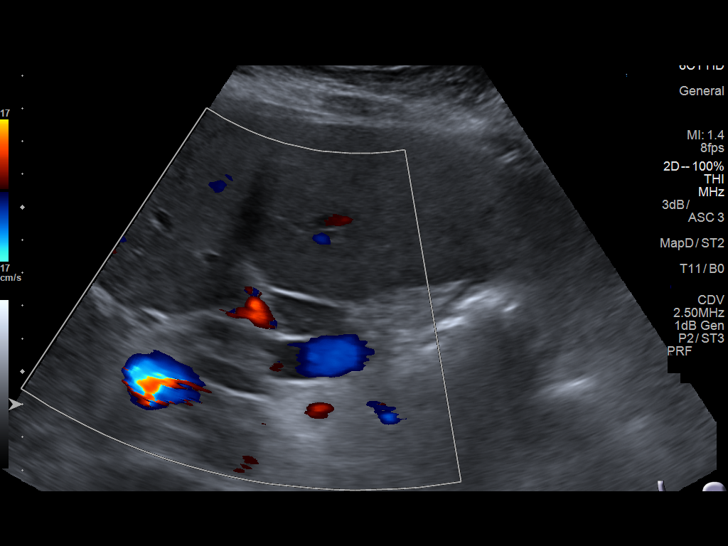
[im 7/73]
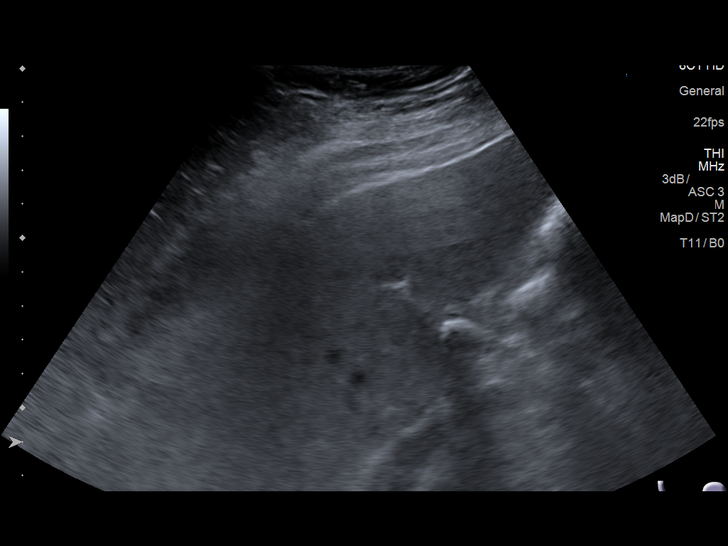
[im 13/73]
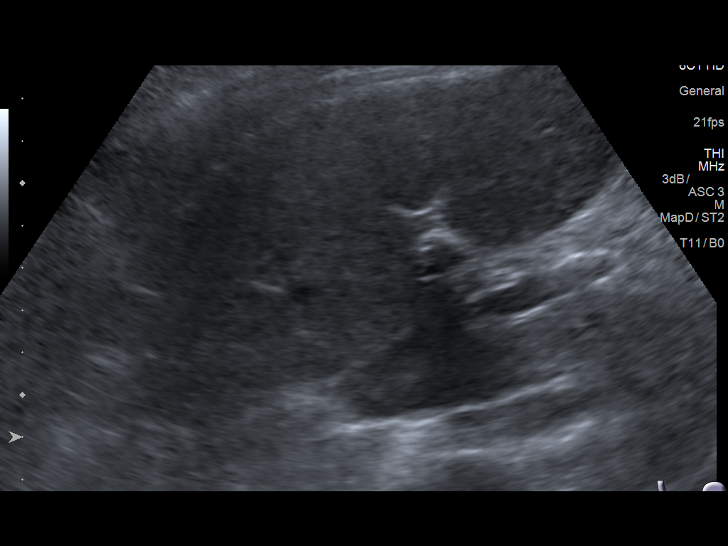
[im 19/73]
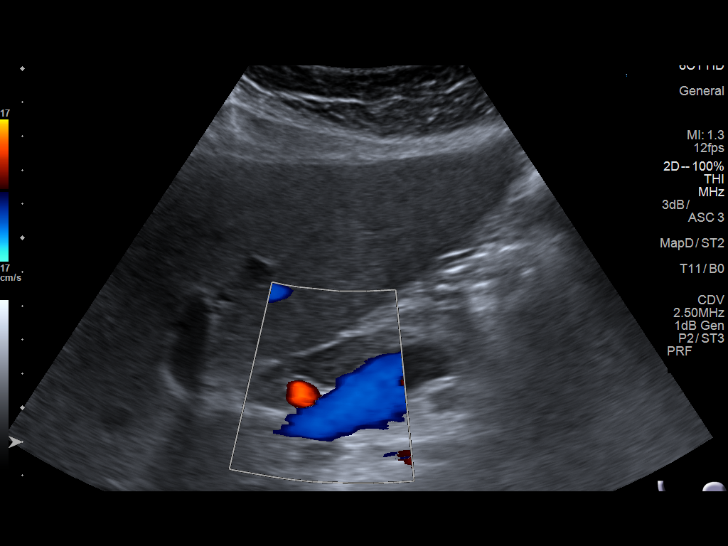
[im 25/73]
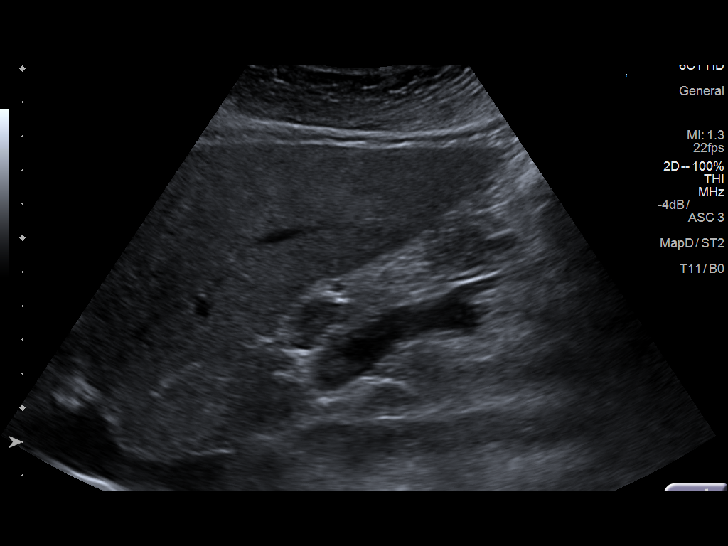
[im 31/73]
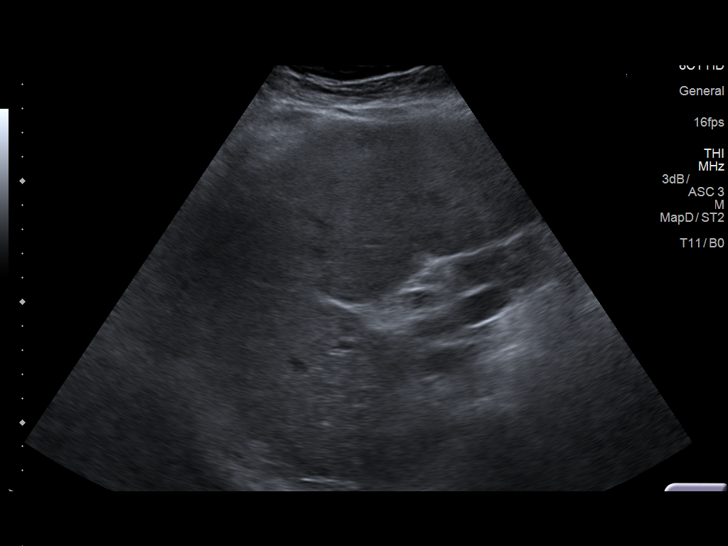
[im 37/73]
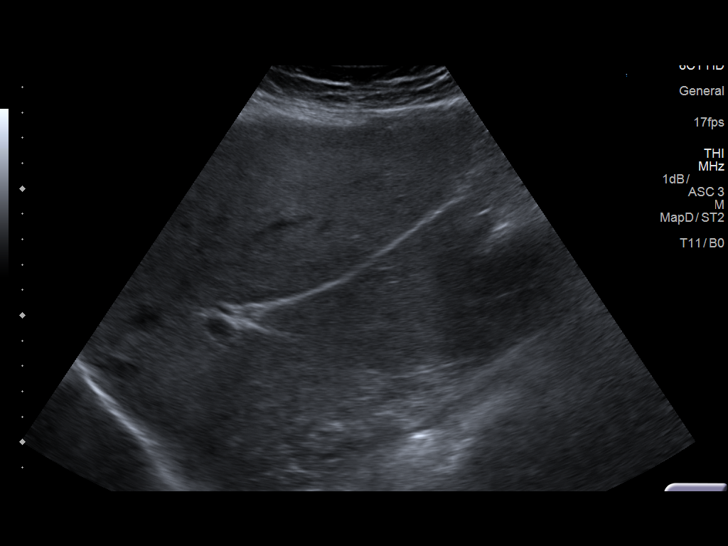
[im 43/73]
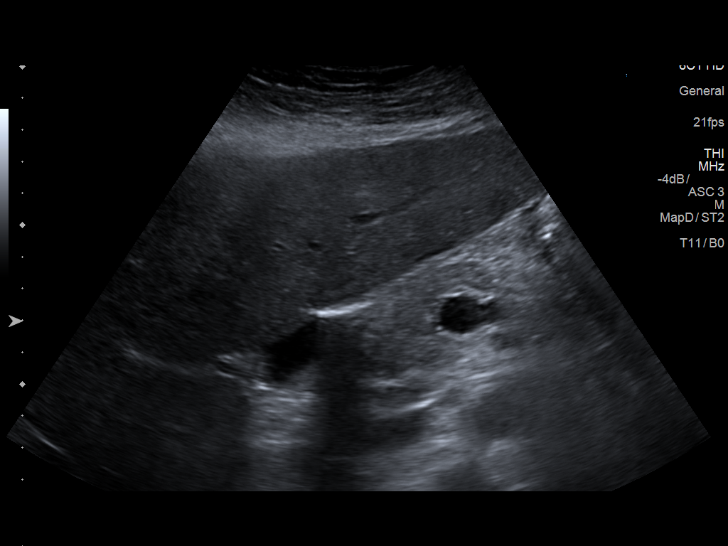
[im 49/73]
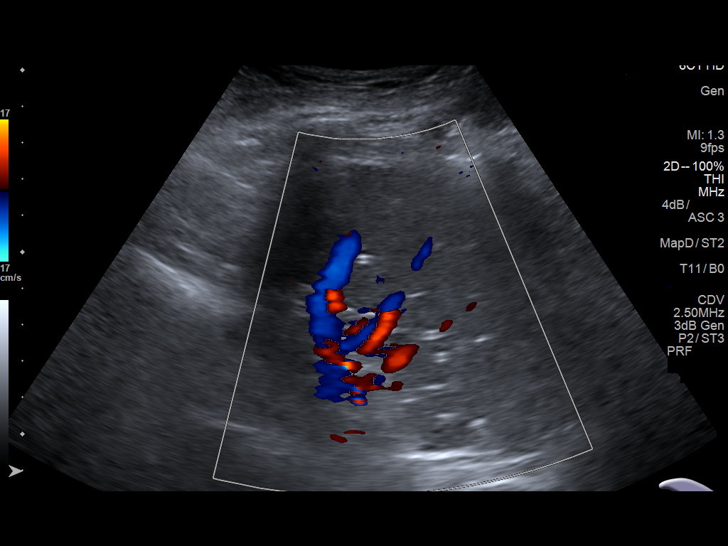
[im 55/73]
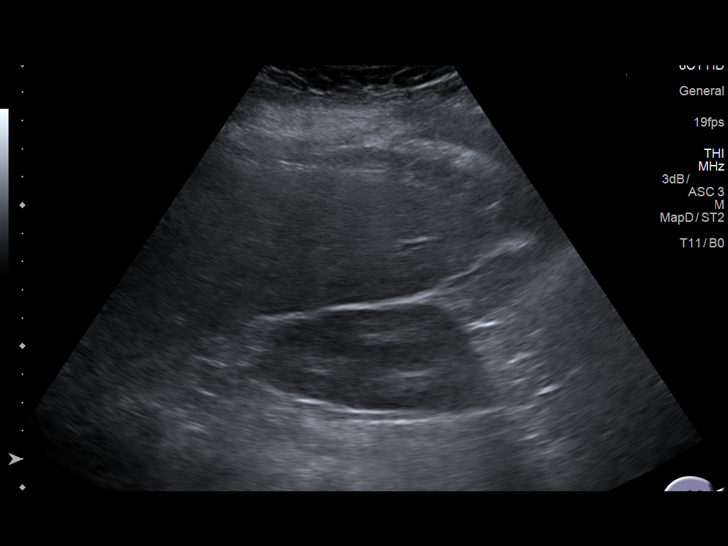
[im 61/73]
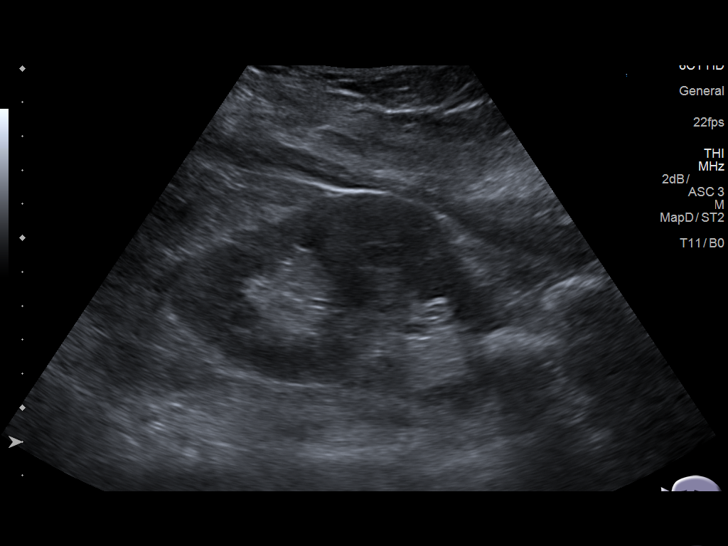
[im 67/73]
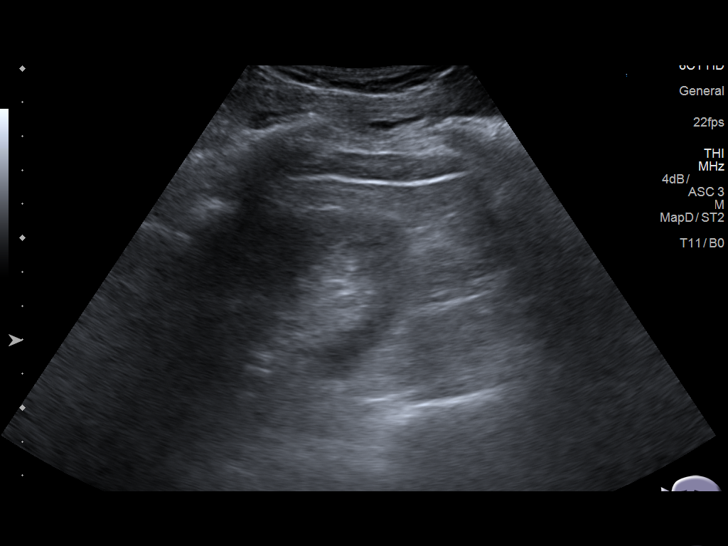
[im 73/73]
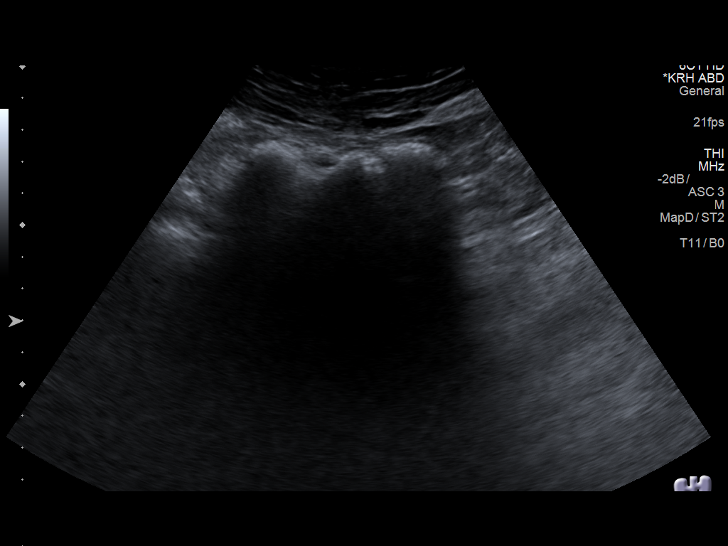

[13 of 25 positions shown; findings below may reference images not displayed]

FINDINGS: Gallbladder: Wall-echo-shadow sign (MASAITIENE sign) indicating a
gallbladder contracted about gallstone(s). No pericholecystic fluid.
Equivocal sonographic Murphy sign.

Common bile duct: Diameter: 7 mm, upper limits of normal to mildly
dilated, although the CBD measured up to 6-7 mm on the 3065 CT
comparison (coronal image 52 of that exam).

Liver: Liver echogenicity is within normal limits today. No discrete
liver lesion. No intrahepatic biliary ductal dilatation is evident.

IVC: No abnormality visualized.

Pancreas: Visualized portion unremarkable.

Spleen: Size and appearance within normal limits.

Right Kidney: Length: 9.5 cm. Echogenicity within normal limits. No
mass or hydronephrosis visualized.

Left Kidney: Length: 10.1 cm. Stable small 10 mm simple cyst (image
67). Echogenicity within normal limits. No mass or hydronephrosis
visualized.

Abdominal aorta: Stable mild ectasia of the abdominal aorta (24-26
mm diameter).

Other findings: Chronic porta hepatis lymph nodes appear stable
(image 20). No free fluid.
IMPRESSION: 1. Chronic cholelithiasis with contracted gallbladder today and
equivocal sonographic Murphy sign. Nuclear Medicine Hepatobiliary
Scan may be most valuable for further evaluation.
2. Upper limits of normal to mildly dilated but stable CBD. No
strong evidence of acute biliary obstruction.
3. Ectatic abdominal aorta at risk for aneurysm development.
Recommend followup by Ultrasound in 5 years. This recommendation
follows ACR consensus guidelines: White Paper of the ACR [employer]

## 2018-06-24 DIAGNOSIS — C189 Malignant neoplasm of colon, unspecified: Secondary | ICD-10-CM | POA: Diagnosis not present

## 2018-06-24 DIAGNOSIS — R970 Elevated carcinoembryonic antigen [CEA]: Secondary | ICD-10-CM | POA: Diagnosis not present

## 2018-06-24 DIAGNOSIS — Z85038 Personal history of other malignant neoplasm of large intestine: Secondary | ICD-10-CM | POA: Diagnosis not present

## 2018-06-24 DIAGNOSIS — F17200 Nicotine dependence, unspecified, uncomplicated: Secondary | ICD-10-CM | POA: Diagnosis not present

## 2018-06-29 ENCOUNTER — Ambulatory Visit: Payer: Medicare Other | Admitting: Pulmonary Disease

## 2018-06-29 NOTE — Progress Notes (Deleted)
Synopsis: First seen by [facility] in 2018 for COPD  Subjective:   PATIENT ID: Sheryl [name] GENDER: female DOB: 1946/04/02, MRN: [id number]   HPI  No chief complaint on file.   ***  Past Medical History:  Diagnosis Date  . Anemia    pernicious  . Aortic ectasia (HCC)    infrarenal abdomina aorta 2.6 cm  . Asthma   . Colon cancer ([place]) 2012  . Diabetes mellitus    type 2  . Fatty liver 01/19/2015  . 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 History of pericarditis 07/26/2012  . Hyperlipidemia   . Pericarditis 07/26/2012  . Rheumatoid arthritis(714.0)   . Sjogren's syndrome ([place]) 03/17/2011  . Small bowel obstruction ([place]) 03/2014   ?due to adhesions from colon surgery per pt  . Thyroid disease    hypothyroid  . Tobacco abuse      Family History  Problem Relation Age of Onset  . Heart disease Other        CAD  . Diabetes Other   . Hyperlipidemia Other   . Stroke Other      Social History   Socioeconomic History  . Marital status: Single    Spouse name: Not on file  . Number of children: 1  . Years of education: Not on file  . Highest education level: Not on file  Occupational History  . Occupation: unemployed    Employer: DISABLED  Social Needs  . Financial resource strain: Not on file  . Food insecurity:    Worry: Not on file    Inability: Not on file  . Transportation needs:    Medical: Not on file    Non-medical: Not on file  Tobacco Use  . Smoking status: Current Every Day Smoker    Types: Cigarettes  . Smokeless tobacco: Never Used  . Tobacco comment: 3 cigarettes per day, Uses smokeless cigarette.  Substance and Sexual Activity  . Alcohol use: No    Alcohol/week: 1.0 standard drinks    Types: 1 Glasses of wine per week    Comment: Has not had alcohol since SBO 03/2014  . Drug use: Yes    Comment: Smokes "medical grade" marijuana 2 puffs 3 x weekly for rheumatoid arthritis.per pt  . Sexual  activity: Never  Lifestyle  . Physical activity:    Days per week: Not on file    Minutes per session: Not on file  . Stress: Not on file  Relationships  . Social connections:    Talks on phone: Not on file    Gets together: Not on file    Attends religious service: Not on file    Active member of club or organization: Not on file    Attends meetings of clubs or organizations: Not on file    Relationship status: Not on file  . Intimate partner violence:    Fear of current or ex partner: Not on file    Emotionally abused: Not on file    Physically abused: Not on file    Forced sexual activity: Not on file  Other Topics Concern  . Not on file  Social History Narrative   Regular exercise:  Stretching exercises. Resistance bands   Caffeine: 1 mug (2cus) daily.        Allergies  Allergen Reactions  . Levofloxacin Shortness Of Breath and Rash  . Mold Extract [Trichophyton] Anaphylaxis  . Ativan [Lorazepam]     Visual hallucinations.  Visual  hallucinations.   . Cefuroxime Axetil     REACTION: asthma, cough  . Hydrocodone-Homatropine Nausea Only  . Rosuvastatin Calcium     Muscle pain, syncope  . Statins     Muscle pain, syncope  . Zetia [Ezetimibe] Diarrhea  . Hydrocodone-Acetaminophen Nausea Only     Outpatient Medications Prior to Visit  Medication Sig Dispense Refill  . albuterol (VENTOLIN HFA) 108 (90 Base) MCG/ACT inhaler Inhale 2 puffs into the lungs every 6 (six) hours as needed for wheezing or shortness of breath. 54 g 1  . aspirin 81 MG tablet Take 81 mg by mouth daily.      . betamethasone dipropionate (DIPROLENE) 0.05 % cream Apply topically 2 (two) times daily. 30 g 1  . Cyanocobalamin (B-12) 1000 MCG CAPS Take 1,000 mcg by mouth daily.    Marland Kitchen estradiol (ESTRACE VAGINAL) 0.1 MG/GM vaginal cream 2g daily intravaginally for 1 week, then reduce to 1 gram PV daily for 1 week , followed by a maintenance dose of 1 g PV once weekly 42.5 g 5  . fluticasone (FLONASE) 50  MCG/ACT nasal spray Place 2 sprays into both nostrils daily as needed. 9.9 g 5  . Fluticasone-Umeclidin-Vilant (TRELEGY ELLIPTA) 100-62.5-25 MCG/INH AEPB Inhale 1 puff into the lungs daily. 3 each 3  . levothyroxine (SYNTHROID, LEVOTHROID) 100 MCG tablet Take 1 tablet (100 mcg total) by mouth daily. 90 tablet 1  . montelukast (SINGULAIR) 10 MG tablet Take 1 tablet (10 mg total) by mouth at bedtime. 90 tablet 1  . multivitamin (THERAGRAN) per tablet Take 1 tablet by mouth daily.      . mupirocin ointment (BACTROBAN) 2 % Apply to area twice daily 22 g 0  . NON FORMULARY TUMERIC MILK.    . NON FORMULARY CBD. Derivative of marijuana without the marijuana.    . nystatin (MYCOSTATIN) 100000 UNIT/ML suspension TAKE 5 ML BY MOUTH 4 TIMES A DAY (Patient taking differently: TAKE 5 ML BY MOUTH 4 TIMES A DAY as needed) 180 mL 0  . ondansetron (ZOFRAN-ODT) 8 MG disintegrating tablet Take 1 tablet (8 mg total) by mouth every 8 (eight) hours as needed for nausea. 20 tablet 3  . pantoprazole (PROTONIX) 40 MG tablet Take 1 tablet (40 mg total) by mouth daily. 90 tablet 1  . Probiotic Product (PROBIOTIC DAILY PO) Take 1 capsule by mouth daily.    Marland Kitchen triamcinolone cream (KENALOG) 0.1 % Apply 1 application topically 2 (two) times daily. 30 g 0  . VOLTAREN 1 % GEL APPLY 2 GRAMS TOPICALLY 4 TIMES AS NEEDED 100 g 2  . Zoster Vaccine Live, PF, (ZOSTAVAX) 16109 UNT/0.65ML injection Inject 19,400 Units into the skin once. 1 each 0   No facility-administered medications prior to visit.     ROS    Objective:  Physical Exam   There were no vitals filed for this visit.  ***  CBC    Component Value Date/Time   WBC 6.5 10/14/2016 0941   RBC 5.01 10/14/2016 0941   HGB 15.4 (H) 10/14/2016 0941   HCT 45.8 10/14/2016 0941   PLT 157.0 10/14/2016 0941   MCV 91.4 10/14/2016 0941   MCH 30.9 04/18/2014 2046   MCHC 33.6 10/14/2016 0941   RDW 14.7 10/14/2016 0941   LYMPHSABS 2.0 10/14/2016 0941   MONOABS 0.4  10/14/2016 0941   EOSABS 0.2 10/14/2016 0941   BASOSABS 0.1 10/14/2016 0941     Chest imaging: TEST  CT chest 2017 >mild to mod emphysema 2 mm RUL  nodule stable since 2013, mild reticulation in right base.   PFT: Spirometry 2018  ratio of 54, FEV1 of 50% and FVC of 70%.   Labs:  Path:  Echo:  Heart Catheterization:       Assessment & Plan:   No diagnosis found.  Discussion: ***    Current Outpatient Medications:  .  albuterol (VENTOLIN HFA) 108 (90 Base) MCG/ACT inhaler, Inhale 2 puffs into the lungs every 6 (six) hours as needed for wheezing or shortness of breath., Disp: 54 g, Rfl: 1 .  aspirin 81 MG tablet, Take 81 mg by mouth daily.  , Disp: , Rfl:  .  betamethasone dipropionate (DIPROLENE) 0.05 % cream, Apply topically 2 (two) times daily., Disp: 30 g, Rfl: 1 .  Cyanocobalamin (B-12) 1000 MCG CAPS, Take 1,000 mcg by mouth daily., Disp: , Rfl:  .  estradiol (ESTRACE VAGINAL) 0.1 MG/GM vaginal cream, 2g daily intravaginally for 1 week, then reduce to 1 gram PV daily for 1 week , followed by a maintenance dose of 1 g PV once weekly, Disp: 42.5 g, Rfl: 5 .  fluticasone (FLONASE) 50 MCG/ACT nasal spray, Place 2 sprays into both nostrils daily as needed., Disp: 9.9 g, Rfl: 5 .  Fluticasone-Umeclidin-Vilant (TRELEGY ELLIPTA) 100-62.5-25 MCG/INH AEPB, Inhale 1 puff into the lungs daily., Disp: 3 each, Rfl: 3 .  levothyroxine (SYNTHROID, LEVOTHROID) 100 MCG tablet, Take 1 tablet (100 mcg total) by mouth daily., Disp: 90 tablet, Rfl: 1 .  montelukast (SINGULAIR) 10 MG tablet, Take 1 tablet (10 mg total) by mouth at bedtime., Disp: 90 tablet, Rfl: 1 .  multivitamin (THERAGRAN) per tablet, Take 1 tablet by mouth daily.  , Disp: , Rfl:  .  mupirocin ointment (BACTROBAN) 2 %, Apply to area twice daily, Disp: 22 g, Rfl: 0 .  NON FORMULARY, TUMERIC MILK., Disp: , Rfl:  .  NON FORMULARY, CBD. Derivative of marijuana without the marijuana., Disp: , Rfl:  .  nystatin (MYCOSTATIN)  100000 UNIT/ML suspension, TAKE 5 ML BY MOUTH 4 TIMES A DAY (Patient taking differently: TAKE 5 ML BY MOUTH 4 TIMES A DAY as needed), Disp: 180 mL, Rfl: 0 .  ondansetron (ZOFRAN-ODT) 8 MG disintegrating tablet, Take 1 tablet (8 mg total) by mouth every 8 (eight) hours as needed for nausea., Disp: 20 tablet, Rfl: 3 .  pantoprazole (PROTONIX) 40 MG tablet, Take 1 tablet (40 mg total) by mouth daily., Disp: 90 tablet, Rfl: 1 .  Probiotic Product (PROBIOTIC DAILY PO), Take 1 capsule by mouth daily., Disp: , Rfl:  .  triamcinolone cream (KENALOG) 0.1 %, Apply 1 application topically 2 (two) times daily., Disp: 30 g, Rfl: 0 .  VOLTAREN 1 % GEL, APPLY 2 GRAMS TOPICALLY 4 TIMES AS NEEDED, Disp: 100 g, Rfl: 2 .  Zoster Vaccine Live, PF, (ZOSTAVAX) 03159 UNT/0.65ML injection, Inject 19,400 Units into the skin once., Disp: 1 each, Rfl: 0

## 2018-07-07 DIAGNOSIS — J439 Emphysema, unspecified: Secondary | ICD-10-CM | POA: Diagnosis not present

## 2018-07-07 DIAGNOSIS — I700 Atherosclerosis of aorta: Secondary | ICD-10-CM | POA: Diagnosis not present

## 2018-07-07 DIAGNOSIS — C189 Malignant neoplasm of colon, unspecified: Secondary | ICD-10-CM | POA: Diagnosis not present

## 2018-07-07 DIAGNOSIS — C186 Malignant neoplasm of descending colon: Secondary | ICD-10-CM | POA: Diagnosis not present

## 2018-07-08 ENCOUNTER — Ambulatory Visit (INDEPENDENT_AMBULATORY_CARE_PROVIDER_SITE_OTHER): Payer: Medicare Other | Admitting: Pulmonary Disease

## 2018-07-08 ENCOUNTER — Encounter: Payer: Self-pay | Admitting: Pulmonary Disease

## 2018-07-08 VITALS — BP 118/84 | HR 84 | Ht 62.0 in | Wt 189.0 lb

## 2018-07-08 DIAGNOSIS — J441 Chronic obstructive pulmonary disease with (acute) exacerbation: Secondary | ICD-10-CM | POA: Diagnosis not present

## 2018-07-08 NOTE — Patient Instructions (Signed)
Centrilobular emphysema: You have COPD-asthma overlap syndrome Continue taking Trelegy daily Keep using albuterol as needed for chest tightness wheezing or shortness of breath Practice good hand hygiene I am glad your immunizations are up-to-date Stay active, exercise regularly  History of rheumatoid arthritis: Because this can affect your lungs I think it is reasonable to get a lung function test on an annual basis I do not see evidence of lung involvement on yesterday's CT scan of your chest  We will see you back in 6 months or sooner if needed

## 2018-07-08 NOTE — Progress Notes (Signed)
Synopsis: First seen by [facility] in 2018 for COPD and asthma; she also has Rheumatoid arthritis but was intolerant of treatments (including multiple biologics: Remicade, prednisone, Enbrel, Humira).  Specifically she noted that she had several respiratory infections.  She says that she also took methotrexate and prednisone for years.  She stopped when she was diagnosed with colon cancer in 2013 and hasn't started back.   She smoked for many years, 1/2 pdd; quit around 2017  Subjective:   PATIENT ID: Sheryl [name]: female DOB: [birth year], MRN: [id number]   HPI  Chief Complaint  Patient presents with  . Follow-up    pt is well overall.   Sheryl [name] has asthma-COPD overlap syndrome.  She says that this season (fall, mold) she has "the asthma" and notes a dry cough and feels "constricted" in her chest.  She says that the rescue inhaler preventil helps quite a bit.  She says that every now and then she feels the desire to smoke cigarettes and she will occasionally still smoke.  No bronchitis or pneumonia since the last visit.  Trelegy has controlled her symptoms fairly well but she still has attacks of tightness in her chest from time to time.    She eats marijuana, predominantly puts it in her coffee, this helps with her joint aches.  She says this helps with the RA related inflammation.    Past Medical History:  Diagnosis Date  . Anemia    pernicious  . Aortic ectasia (HCC)    infrarenal abdomina aorta 2.6 cm  . Asthma   . Colon cancer ([place]) 2012  . Diabetes mellitus    type 2  . Fatty liver 01/19/2015  . 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 History of pericarditis 07/26/2012  . Hyperlipidemia   . Pericarditis 07/26/2012  . Rheumatoid arthritis(714.0)   . Sjogren's syndrome ([place]) 03/17/2011  . Small bowel obstruction ([place]) 03/2014   ?due to adhesions from colon surgery per pt  . Thyroid disease    hypothyroid  . Tobacco abuse        Review of Systems  Constitutional: Negative for chills, fever, malaise/fatigue and weight loss.  HENT: Negative for congestion, nosebleeds, sinus pain and sore throat.   Eyes: Negative for photophobia, pain and discharge.  Respiratory: Positive for shortness of breath. Negative for cough, hemoptysis, sputum production and wheezing.   Cardiovascular: Negative for chest pain, palpitations, orthopnea and leg swelling.  Gastrointestinal: Negative for abdominal pain, constipation, diarrhea, nausea and vomiting.  Genitourinary: Negative for dysuria, frequency, hematuria and urgency.  Musculoskeletal: Positive for joint pain and myalgias. Negative for back pain and neck pain.  Skin: Negative for itching and rash.  Neurological: Negative for tingling, tremors, sensory change, speech change, focal weakness, seizures, weakness and headaches.  Psychiatric/Behavioral: Negative for memory loss, substance abuse and suicidal ideas. The patient is not nervous/anxious.       Objective:  Physical Exam   Vitals:   07/08/18 1531  BP: 118/84  Pulse: 84  SpO2: 96%  Weight: 189 lb (85.7 kg)  Height: 5\' 2"  (1.575 m)    Ra  Gen: well appearing, no acute distress HENT: NCAT, OP clear, neck supple without masses Eyes: PERRL, EOMi Lymph: no cervical lymphadenopathy PULM: CTA B CV: RRR, no mgr, no JVD GI: BS+, soft, nontender, no hsm Derm: no rash or skin breakdown MSK: notable MCP, PIP deformities both hands Neuro: A&Ox4, CN II-XII intact, strength 5/5 in all 4 extremities Psyche:  normal mood and affect   CBC    Component Value Date/Time   WBC 6.5 10/14/2016 0941   RBC 5.01 10/14/2016 0941   HGB 15.4 (H) 10/14/2016 0941   HCT 45.8 10/14/2016 0941   PLT 157.0 10/14/2016 0941   MCV 91.4 10/14/2016 0941   MCH 30.9 04/18/2014 2046   MCHC 33.6 10/14/2016 0941   RDW 14.7 10/14/2016 0941   LYMPHSABS 2.0 10/14/2016 0941   MONOABS 0.4 10/14/2016 0941   EOSABS 0.2 10/14/2016 0941    BASOSABS 0.1 10/14/2016 0941     Chest imaging: TEST  CT chest 2017 >mild to mod emphysema 2 mm RUL nodule stable since 2013, mild reticulation in right base.  November 2019 CT chest images independently reviewed showing mild centrilobular emphysema and an upper lobe dependent fashion, otherwise within normal limit  PFT: Spirometry 2018  ratio of 54, FEV1 of 50% and FVC of 70%.   Labs:  Path:  Echo:  Heart Catheterization:       Assessment & Plan:   No diagnosis found.  Discussion: This is a pleasant 72 year old female who used to smoke 1/2 pack of cigarettes daily for many years who comes to my clinic today for follow-up evaluation of centrilobular emphysema, COPD, and asthma overlap syndrome.  She also has an underlying history of rheumatoid arthritis but fortunately I do not see evidence of rheumatoid arthritis associated lung disease on yesterday's CT scan of her chest.  Centrilobular emphysema: You have COPD-asthma overlap syndrome Continue taking Trelegy daily Keep using albuterol as needed for chest tightness wheezing or shortness of breath Practice good hand hygiene I am glad your immunizations are up-to-date Stay active, exercise regularly  History of rheumatoid arthritis: Because this can affect your lungs I think it is reasonable to get a lung function test on an annual basis I do not see evidence of lung involvement on yesterday's CT scan of your chest  We will see you back in 6 months or sooner if needed    Current Outpatient Medications:  .  albuterol (VENTOLIN HFA) 108 (90 Base) MCG/ACT inhaler, Inhale 2 puffs into the lungs every 6 (six) hours as needed for wheezing or shortness of breath., Disp: 54 g, Rfl: 1 .  aspirin 81 MG tablet, Take 81 mg by mouth daily.  , Disp: , Rfl:  .  betamethasone dipropionate (DIPROLENE) 0.05 % cream, Apply topically 2 (two) times daily., Disp: 30 g, Rfl: 1 .  Cyanocobalamin (B-12) 1000 MCG CAPS, Take 1,000 mcg by mouth  daily., Disp: , Rfl:  .  estradiol (ESTRACE VAGINAL) 0.1 MG/GM vaginal cream, 2g daily intravaginally for 1 week, then reduce to 1 gram PV daily for 1 week , followed by a maintenance dose of 1 g PV once weekly, Disp: 42.5 g, Rfl: 5 .  fluticasone (FLONASE) 50 MCG/ACT nasal spray, Place 2 sprays into both nostrils daily as needed., Disp: 9.9 g, Rfl: 5 .  Fluticasone-Umeclidin-Vilant (TRELEGY ELLIPTA) 100-62.5-25 MCG/INH AEPB, Inhale 1 puff into the lungs daily., Disp: 3 each, Rfl: 3 .  levothyroxine (SYNTHROID, LEVOTHROID) 100 MCG tablet, Take 1 tablet (100 mcg total) by mouth daily., Disp: 90 tablet, Rfl: 1 .  montelukast (SINGULAIR) 10 MG tablet, Take 1 tablet (10 mg total) by mouth at bedtime., Disp: 90 tablet, Rfl: 1 .  multivitamin (THERAGRAN) per tablet, Take 1 tablet by mouth daily.  , Disp: , Rfl:  .  mupirocin ointment (BACTROBAN) 2 %, Apply to area twice daily, Disp: 22 g, Rfl: 0 .  NON FORMULARY, TUMERIC MILK., Disp: , Rfl:  .  NON FORMULARY, CBD. Derivative of marijuana without the marijuana., Disp: , Rfl:  .  nystatin (MYCOSTATIN) 100000 UNIT/ML suspension, TAKE 5 ML BY MOUTH 4 TIMES A DAY (Patient taking differently: TAKE 5 ML BY MOUTH 4 TIMES A DAY as needed), Disp: 180 mL, Rfl: 0 .  ondansetron (ZOFRAN-ODT) 8 MG disintegrating tablet, Take 1 tablet (8 mg total) by mouth every 8 (eight) hours as needed for nausea., Disp: 20 tablet, Rfl: 3 .  pantoprazole (PROTONIX) 40 MG tablet, Take 1 tablet (40 mg total) by mouth daily., Disp: 90 tablet, Rfl: 1 .  Probiotic Product (PROBIOTIC DAILY PO), Take 1 capsule by mouth daily., Disp: , Rfl:  .  triamcinolone cream (KENALOG) 0.1 %, Apply 1 application topically 2 (two) times daily., Disp: 30 g, Rfl: 0 .  VOLTAREN 1 % GEL, APPLY 2 GRAMS TOPICALLY 4 TIMES AS NEEDED, Disp: 100 g, Rfl: 2 .  Zoster Vaccine Live, PF, (ZOSTAVAX) 01093 UNT/0.65ML injection, Inject 19,400 Units into the skin once., Disp: 1 each, Rfl: 0

## 2018-08-02 ENCOUNTER — Encounter: Payer: Self-pay | Admitting: Family

## 2018-08-02 ENCOUNTER — Ambulatory Visit (INDEPENDENT_AMBULATORY_CARE_PROVIDER_SITE_OTHER): Payer: Medicare Other | Admitting: Family

## 2018-08-02 ENCOUNTER — Ambulatory Visit: Payer: Self-pay | Admitting: *Deleted

## 2018-08-02 VITALS — BP 148/81 | HR 93 | Temp 99.9°F | Resp 16 | Ht 62.0 in | Wt 191.0 lb

## 2018-08-02 DIAGNOSIS — R590 Localized enlarged lymph nodes: Secondary | ICD-10-CM

## 2018-08-02 DIAGNOSIS — J019 Acute sinusitis, unspecified: Secondary | ICD-10-CM | POA: Diagnosis not present

## 2018-08-02 MED ORDER — AMOXICILLIN-POT CLAVULANATE 875-125 MG PO TABS: 1.0000 | ORAL_TABLET | Freq: Two times a day (BID) | ORAL | 0 refills | Status: DC

## 2018-08-02 MED ORDER — FLUTICASONE PROPIONATE 50 MCG/ACT NA SUSP: 2.0000 | Freq: Every day | NASAL | 6 refills | Status: DC

## 2018-08-02 MED FILL — AMOX-CLAV 875-125 MG TABLET: 875-125 | 10 days supply | Qty: 20 | Fill #0

## 2018-08-02 MED FILL — FLUTICASONE PROP 50 MCG SPR: 50 | 30 days supply | Qty: 16 | Fill #0

## 2018-08-02 NOTE — Progress Notes (Signed)
Subjective:    Patient ID: Sheryl [name], female    DOB: 1946/01/25, 72 y.o.   MRN: [id number]  HPI  Patient reports sinus congestion x 1 month. Reports that she developed sudden swelling of a gland in her upper neck.  "like the size of an egg."  Reports sinus pain/pressure in her cheeks.  She reports low grade temp at home this AM. Denies ear pain/throat pain.     Review of Systems    see HPI  Past Medical History:  Diagnosis Date  . Anemia    pernicious  . Aortic ectasia (HCC)    infrarenal abdomina aorta 2.6 cm  . Asthma   . Colon cancer ([place]) 2012  . Diabetes mellitus    type 2  . Fatty liver 01/19/2015  . 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 History of pericarditis 07/26/2012  . Hyperlipidemia   . Pericarditis 07/26/2012  . Rheumatoid arthritis(714.0)   . Sjogren's syndrome ([place]) 03/17/2011  . Small bowel obstruction ([place]) 03/2014   ?due to adhesions from colon surgery per pt  . Thyroid disease    hypothyroid  . Tobacco abuse      Social History   Socioeconomic History  . Marital status: Single    Spouse name: Not on file  . Number of children: 1  . Years of education: Not on file  . Highest education level: Not on file  Occupational History  . Occupation: unemployed    Employer: DISABLED  Social Needs  . Financial resource strain: Not on file  . Food insecurity:    Worry: Not on file    Inability: Not on file  . Transportation needs:    Medical: Not on file    Non-medical: Not on file  Tobacco Use  . Smoking status: Current Every Day Smoker    Types: Cigarettes  . Smokeless tobacco: Never Used  . Tobacco comment: 3 cigarettes per day, Uses smokeless cigarette.  Substance and Sexual Activity  . Alcohol use: No    Alcohol/week: 1.0 standard drinks    Types: 1 Glasses of wine per week    Comment: Has not had alcohol since SBO 03/2014  . Drug use: Yes    Comment: Smokes "medical grade" marijuana 2 puffs 3 x  weekly for rheumatoid arthritis.per pt  . Sexual activity: Never  Lifestyle  . Physical activity:    Days per week: Not on file    Minutes per session: Not on file  . Stress: Not on file  Relationships  . Social connections:    Talks on phone: Not on file    Gets together: Not on file    Attends religious service: Not on file    Active member of club or organization: Not on file    Attends meetings of clubs or organizations: Not on file    Relationship status: Not on file  . Intimate partner violence:    Fear of current or ex partner: Not on file    Emotionally abused: Not on file    Physically abused: Not on file    Forced sexual activity: Not on file  Other Topics Concern  . Not on file  Social History Narrative   Regular exercise:  Stretching exercises. Resistance bands   Caffeine: 1 mug (2cus) daily.       Past Surgical History:  Procedure Laterality Date  . CHOLECYSTECTOMY  10/23/2016  . COLON SURGERY  8/.23/13   tumor removed from sigmoid  colon.  Marland Kitchen DILATION AND CURETTAGE OF UTERUS  1975  . HERNIA REPAIR  10/23/2016  . KNEE SURGERY  2006   arthroscopic left knee  . KNEE SURGERY  2001   right knee    Family History  Problem Relation Age of Onset  . Heart disease Other        CAD  . Diabetes Other   . Hyperlipidemia Other   . Stroke Other     Allergies  Allergen Reactions  . Levofloxacin Shortness Of Breath and Rash  . Mold Extract [Trichophyton] Anaphylaxis  . Ativan [Lorazepam]     Visual hallucinations.  Visual hallucinations.   . Cefuroxime Axetil     REACTION: asthma, cough  . Hydrocodone-Homatropine Nausea Only  . Rosuvastatin Calcium     Muscle pain, syncope  . Statins     Muscle pain, syncope  . Zetia [Ezetimibe] Diarrhea  . Hydrocodone-Acetaminophen Nausea Only    Current Outpatient Medications on File Prior to Visit  Medication Sig Dispense Refill  . albuterol (VENTOLIN HFA) 108 (90 Base) MCG/ACT inhaler Inhale 2 puffs into the lungs  every 6 (six) hours as needed for wheezing or shortness of breath. 54 g 1  . aspirin 81 MG tablet Take 81 mg by mouth daily.      . betamethasone dipropionate (DIPROLENE) 0.05 % cream Apply topically 2 (two) times daily. 30 g 1  . Cyanocobalamin (B-12) 1000 MCG CAPS Take 1,000 mcg by mouth daily.    Marland Kitchen estradiol (ESTRACE VAGINAL) 0.1 MG/GM vaginal cream 2g daily intravaginally for 1 week, then reduce to 1 gram PV daily for 1 week , followed by a maintenance dose of 1 g PV once weekly 42.5 g 5  . fluticasone (FLONASE) 50 MCG/ACT nasal spray Place 2 sprays into both nostrils daily as needed. 9.9 g 5  . Fluticasone-Umeclidin-Vilant (TRELEGY ELLIPTA) 100-62.5-25 MCG/INH AEPB Inhale 1 puff into the lungs daily. 3 each 3  . levothyroxine (SYNTHROID, LEVOTHROID) 100 MCG tablet Take 1 tablet (100 mcg total) by mouth daily. 90 tablet 1  . montelukast (SINGULAIR) 10 MG tablet Take 1 tablet (10 mg total) by mouth at bedtime. 90 tablet 1  . multivitamin (THERAGRAN) per tablet Take 1 tablet by mouth daily.      . mupirocin ointment (BACTROBAN) 2 % Apply to area twice daily 22 g 0  . NON FORMULARY TUMERIC MILK.    . NON FORMULARY CBD. Derivative of marijuana without the marijuana.    . nystatin (MYCOSTATIN) 100000 UNIT/ML suspension TAKE 5 ML BY MOUTH 4 TIMES A DAY (Patient taking differently: TAKE 5 ML BY MOUTH 4 TIMES A DAY as needed) 180 mL 0  . ondansetron (ZOFRAN-ODT) 8 MG disintegrating tablet Take 1 tablet (8 mg total) by mouth every 8 (eight) hours as needed for nausea. 20 tablet 3  . pantoprazole (PROTONIX) 40 MG tablet Take 1 tablet (40 mg total) by mouth daily. 90 tablet 1  . Probiotic Product (PROBIOTIC DAILY PO) Take 1 capsule by mouth daily.    Marland Kitchen triamcinolone cream (KENALOG) 0.1 % Apply 1 application topically 2 (two) times daily. 30 g 0  . VOLTAREN 1 % GEL APPLY 2 GRAMS TOPICALLY 4 TIMES AS NEEDED 100 g 2  . Zoster Vaccine Live, PF, (ZOSTAVAX) 84166 UNT/0.65ML injection Inject 19,400 Units into  the skin once. 1 each 0   No current facility-administered medications on file prior to visit.     BP (!) 148/81 (BP Location: Right Arm, Patient Position: Sitting, Cuff  Size: Small)   Pulse 93   Temp 99.9 F (37.7 C) (Oral)   Resp 16   Ht 5\' 2"  (1.575 m)   Wt 191 lb (86.6 kg)   SpO2 97%   BMI 34.93 kg/m    Objective:   Physical Exam  Constitutional: She is oriented to person, place, and time. She appears well-developed and well-nourished.  HENT:  Head: Normocephalic.  Neck: Neck supple. No thyromegaly present.  Mild soft tissue swelling beneath the jawline  Cardiovascular: Normal rate, regular rhythm and normal heart sounds.  No murmur heard. Pulmonary/Chest: Effort normal and breath sounds normal. No respiratory distress. She has no wheezes.  Neurological: She is alert and oriented to person, place, and time.  Skin: Skin is warm and dry.  Psychiatric: She has a normal mood and affect. Her behavior is normal. Judgment and thought content normal.          Assessment & Plan:  Sinusitis/cervical LAD- Will rx with augmentin.  Pt is advised to call if symptoms worsen or if symptoms do not improve in 3-4 days. Advised her that I would like to see her back in the office in 2 weeks for re-evaluation.

## 2018-08-02 NOTE — Telephone Encounter (Signed)
Pt reports cold, "Sinus type symptoms" x 3 weeks. States has been treating with Flonase, saline, OTC meds. Reports H/O allergies, sinusitis. States yesterday noted swelling under left ear, at jaw line, "Size of a egg, but smaller at times." Area is hard, non-tender with palpation but painful with chewing. Denies earache. States occasionally coughs up "Greyish thick phlegm" with one episode over weekend "Greenish."  States  "Felt feverish yesterday, flushed, hot, eyes red." Did not check temp. States did have nasal drainage, now nasal passages are "Blocked." Reports H/O allergies and sinusitis. Appt made for today with M. Inda Castle. Care advise given per protocol.  Reason for Disposition . [1] Sinus pain (not just congestion) AND [2] fever    Swelling under left ear at jaw line.  Answer Assessment - Initial Assessment Questions 1. LOCATION: "Where does it hurt?"      Left side, under ear, at neck; headache, forehead. 2. ONSET: "When did the sinus pain start?"  (e.g., hours, days)     Yesterday with swelling at neck 3. SEVERITY: "How bad is the pain?"   (Scale 1-10; mild, moderate or severe)   - MILD (1-3): doesn't interfere with normal activities    - MODERATE (4-7): interferes with normal activities (e.g., work or school) or awakens from sleep   - SEVERE (8-10): excruciating pain and patient unable to do any normal activities        Mild 4. RECURRENT SYMPTOM: "Have you ever had sinus problems before?" If so, ask: "When was the last time?" and "What happened that time?"      Yes; Allergies 5. NASAL CONGESTION: "Is the nose blocked?" If so, ask, "Can you open it or must you breathe through the mouth?"    Blocked now; drainage few weeks ago, "Greyish." 6. NASAL DISCHARGE: "Do you have discharge from your nose?" If so ask, "What color?"     One episode weekend, greenish 7. FEVER: "Do you have a fever?" If so, ask: "What is it, how was it measured, and when did it start?"      Yesterday, "Felt  warm, flushed, eyes burning."  8. OTHER SYMPTOMS: "Do you have any other symptoms?" (e.g., sore throat, cough, earache, difficulty breathing)     Sneezing, swelling left neck, jaw line, under ear, size of small egg, smaller at times.  Protocols used: SINUS PAIN OR CONGESTION-A-AH

## 2018-08-02 NOTE — Patient Instructions (Signed)
Please begin augmentin. Call if symptoms worsen or if not improved in 3-4 days.  Try adding mucinex for sinus congestion.

## 2018-08-12 ENCOUNTER — Ambulatory Visit: Payer: Self-pay

## 2018-08-12 NOTE — Telephone Encounter (Signed)
Returned call to patient who states she was seen for sinus infection on 12/9.  At that time she had a swollen lymph node on the left neck. [name] wanted her to call if the lump remained.  The patient states that while on antibiotic the lump went away but has now come back.  She says that it isn't painful but the pressure to the area is noticeable and annoying. She states that the lump get bigger when she eats.  She states it is the size of a small egg. It does not itch. She has no fever but had a low grade just a couple days ago.  She states she has a little pressure to her left ear.  She has a cough. Appointment scheduled per protocol. Care advice read to patient. Pt verbalized understanding of all instructions  Reason for Disposition . [1] Small swelling or lump AND [2] unexplained AND [3] present > 1 week  Answer Assessment - Initial Assessment Questions 1. APPEARANCE of SWELLING: "What does it look like?" (e.g., lymph node, insect bite, mole)     Lymph node 2. SIZE: "How large is the swelling?" (inches, cm or compare to coins)     Small egg comes and goes 3. LOCATION: "Where is the swelling located?"     Left neck below ear 4. ONSET: "When did the swelling start?"     12/9 with sinus infection 5. PAIN: "Is it painful?" If so, ask: "How much?"     Slight pain noticeable pressure 6. ITCH: "Does it itch?" If so, ask: "How much?"     No 7. CAUSE: "What do you think caused the swelling?"     unsure 8. OTHER SYMPTOMS: "Do you have any other symptoms?" (e.g., fever)     Gets puffy with eating and the left ear feel like it has pressure.  Protocols used: SKIN LUMP OR LOCALIZED SWELLING-A-AH

## 2018-08-16 ENCOUNTER — Ambulatory Visit: Payer: Medicare Other | Admitting: Medical

## 2018-08-23 ENCOUNTER — Ambulatory Visit: Payer: Self-pay

## 2018-08-23 NOTE — Telephone Encounter (Signed)
Pt wanted to know if she needs to come in or go to [facility] before tomorrow. Pt is concerned with swollen lymph node. Pt has h/o cancer. Advised pt to go to to appointment as scheduled

## 2018-08-24 ENCOUNTER — Encounter: Payer: Self-pay | Admitting: Family

## 2018-08-24 ENCOUNTER — Ambulatory Visit (INDEPENDENT_AMBULATORY_CARE_PROVIDER_SITE_OTHER): Payer: Medicare Other | Admitting: Family

## 2018-08-24 ENCOUNTER — Ambulatory Visit (HOSPITAL_BASED_OUTPATIENT_CLINIC_OR_DEPARTMENT_OTHER)
Admission: RE | Admit: 2018-08-24 | Discharge: 2018-08-24 | Disposition: A | Discharge status: Home / Self Care | Payer: Medicare Other | Source: Ambulatory Visit | Attending: Family | Admitting: Family

## 2018-08-24 VITALS — BP 137/78 | HR 89 | Temp 98.6°F | Resp 16 | Ht 62.0 in | Wt 189.0 lb

## 2018-08-24 DIAGNOSIS — R0602 Shortness of breath: Secondary | ICD-10-CM | POA: Diagnosis not present

## 2018-08-24 DIAGNOSIS — J45901 Unspecified asthma with (acute) exacerbation: Secondary | ICD-10-CM | POA: Diagnosis not present

## 2018-08-24 DIAGNOSIS — R0789 Other chest pain: Secondary | ICD-10-CM

## 2018-08-24 DIAGNOSIS — R079 Chest pain, unspecified: Secondary | ICD-10-CM | POA: Diagnosis not present

## 2018-08-24 LAB — CBC WITH DIFFERENTIAL/PLATELET
Absolute Monocytes: 554 cells/uL (ref 200–950)
Basophils Absolute: 53 cells/uL (ref 0–200)
Basophils Relative: 0.8 %
EOS PCT: 3.5 %
Eosinophils Absolute: 231 cells/uL (ref 15–500)
HEMATOCRIT: 44.2 % (ref 35.0–45.0)
Hemoglobin: 15.6 g/dL — ABNORMAL HIGH (ref 11.7–15.5)
Lymphs Abs: 1643 cells/uL (ref 850–3900)
MCH: 31.8 pg (ref 27.0–33.0)
MCHC: 35.3 g/dL (ref 32.0–36.0)
MCV: 90 fL (ref 80.0–100.0)
MPV: 11.2 fL (ref 7.5–12.5)
Monocytes Relative: 8.4 %
Neutro Abs: 4118 cells/uL (ref 1500–7800)
Neutrophils Relative %: 62.4 %
Platelets: 141 10*3/uL (ref 140–400)
RBC: 4.91 10*6/uL (ref 3.80–5.10)
RDW: 13.2 % (ref 11.0–15.0)
Total Lymphocyte: 24.9 %
WBC: 6.6 10*3/uL (ref 3.8–10.8)

## 2018-08-24 LAB — TROPONIN I: Troponin I: 0.01 ng/mL (ref <=0.0)

## 2018-08-24 MED ORDER — PREDNISONE 10 MG PO TABS: ORAL_TABLET | ORAL | 0 refills | Status: DC

## 2018-08-24 NOTE — Patient Instructions (Addendum)
Please begin prednisone taper for your asthma and continue albuterol every 6 hours as needed. Complete lab work prior to leaving.  You should be contacted about scheduling your echocardiogram. Please go to the ER if you develop worsening shortness of breath or chest discomfort.

## 2018-08-24 NOTE — Progress Notes (Signed)
Subjective:    Patient ID: Sheryl [name], female    DOB: August 26, 1945, 72 y.o.   MRN: [id number]  HPI  Patient is a 72 yr old female who presents today with   Was treated on 12/9 for sinusitus and enlarged cervical LN.  Reports resolution of swollen gland and sinus congestion.  Reports that on Saturday afternoon she developed malaise. She then developed "discomfort in my esophagus." Thought it was indigestion from steak. Took some antacids which helps som. Then developed shoulder and chest pain neck pain and ear pain. Reports slight cough.  She denies chest pain today. She reports some heaviness in her chest.    Reports that she had a low grade fever on Saturday night. Reports that she did some yard work and thought that  Review of Systems See HPI  Past Medical History:  Diagnosis Date  . Anemia    pernicious  . Aortic ectasia (HCC)    infrarenal abdomina aorta 2.6 cm  . Asthma   . Colon cancer ([place]) 2012  . Diabetes mellitus    type 2  . Fatty liver 01/19/2015  . 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 History of pericarditis 07/26/2012  . Hyperlipidemia   . Pericarditis 07/26/2012  . Rheumatoid arthritis(714.0)   . Sjogren's syndrome ([place]) 03/17/2011  . Small bowel obstruction ([place]) 03/2014   ?due to adhesions from colon surgery per pt  . Thyroid disease    hypothyroid  . Tobacco abuse      Social History   Socioeconomic History  . Marital status: Single    Spouse name: Not on file  . Number of children: 1  . Years of education: Not on file  . Highest education level: Not on file  Occupational History  . Occupation: unemployed    Employer: DISABLED  Social Needs  . Financial resource strain: Not on file  . Food insecurity:    Worry: Not on file    Inability: Not on file  . Transportation needs:    Medical: Not on file    Non-medical: Not on file  Tobacco Use  . Smoking status: Current Every Day Smoker    Types: Cigarettes   . Smokeless tobacco: Never Used  . Tobacco comment: 3 cigarettes per day, Uses smokeless cigarette.  Substance and Sexual Activity  . Alcohol use: No    Alcohol/week: 1.0 standard drinks    Types: 1 Glasses of wine per week    Comment: Has not had alcohol since SBO 03/2014  . Drug use: Yes    Comment: Smokes "medical grade" marijuana 2 puffs 3 x weekly for rheumatoid arthritis.per pt  . Sexual activity: Never  Lifestyle  . Physical activity:    Days per week: Not on file    Minutes per session: Not on file  . Stress: Not on file  Relationships  . Social connections:    Talks on phone: Not on file    Gets together: Not on file    Attends religious service: Not on file    Active member of club or organization: Not on file    Attends meetings of clubs or organizations: Not on file    Relationship status: Not on file  . Intimate partner violence:    Fear of current or ex partner: Not on file    Emotionally abused: Not on file    Physically abused: Not on file    Forced sexual activity: Not on file  Other  Topics Concern  . Not on file  Social History Narrative   Regular exercise:  Stretching exercises. Resistance bands   Caffeine: 1 mug (2cus) daily.       Past Surgical History:  Procedure Laterality Date  . CHOLECYSTECTOMY  10/23/2016  . COLON SURGERY  8/.23/13   tumor removed from sigmoid colon.  Marland Kitchen DILATION AND CURETTAGE OF UTERUS  1975  . HERNIA REPAIR  10/23/2016  . KNEE SURGERY  2006   arthroscopic left knee  . KNEE SURGERY  2001   right knee    Family History  Problem Relation Age of Onset  . Heart disease Other        CAD  . Diabetes Other   . Hyperlipidemia Other   . Stroke Other     Allergies  Allergen Reactions  . Levofloxacin Shortness Of Breath and Rash  . Mold Extract [Trichophyton] Anaphylaxis  . Ativan [Lorazepam]     Visual hallucinations.  Visual hallucinations.   . Cefuroxime Axetil     REACTION: asthma, cough  . Hydrocodone-Homatropine  Nausea Only  . Rosuvastatin Calcium     Muscle pain, syncope  . Statins     Muscle pain, syncope  . Zetia [Ezetimibe] Diarrhea  . Hydrocodone-Acetaminophen Nausea Only    Current Outpatient Medications on File Prior to Visit  Medication Sig Dispense Refill  . albuterol (VENTOLIN HFA) 108 (90 Base) MCG/ACT inhaler Inhale 2 puffs into the lungs every 6 (six) hours as needed for wheezing or shortness of breath. 54 g 1  . amoxicillin-clavulanate (AUGMENTIN) 875-125 MG tablet Take 1 tablet by mouth 2 (two) times daily. 20 tablet 0  . aspirin 81 MG tablet Take 81 mg by mouth daily.      . betamethasone dipropionate (DIPROLENE) 0.05 % cream Apply topically 2 (two) times daily. 30 g 1  . Cyanocobalamin (B-12) 1000 MCG CAPS Take 1,000 mcg by mouth daily.    Marland Kitchen estradiol (ESTRACE VAGINAL) 0.1 MG/GM vaginal cream 2g daily intravaginally for 1 week, then reduce to 1 gram PV daily for 1 week , followed by a maintenance dose of 1 g PV once weekly 42.5 g 5  . fluticasone (FLONASE) 50 MCG/ACT nasal spray Place 2 sprays into both nostrils daily as needed. 9.9 g 5  . fluticasone (FLONASE) 50 MCG/ACT nasal spray Place 2 sprays into both nostrils daily. 16 g 6  . Fluticasone-Umeclidin-Vilant (TRELEGY ELLIPTA) 100-62.5-25 MCG/INH AEPB Inhale 1 puff into the lungs daily. 3 each 3  . levothyroxine (SYNTHROID, LEVOTHROID) 100 MCG tablet Take 1 tablet (100 mcg total) by mouth daily. 90 tablet 1  . montelukast (SINGULAIR) 10 MG tablet Take 1 tablet (10 mg total) by mouth at bedtime. 90 tablet 1  . multivitamin (THERAGRAN) per tablet Take 1 tablet by mouth daily.      . mupirocin ointment (BACTROBAN) 2 % Apply to area twice daily 22 g 0  . NON FORMULARY TUMERIC MILK.    . NON FORMULARY CBD. Derivative of marijuana without the marijuana.    . nystatin (MYCOSTATIN) 100000 UNIT/ML suspension TAKE 5 ML BY MOUTH 4 TIMES A DAY (Patient taking differently: TAKE 5 ML BY MOUTH 4 TIMES A DAY as needed) 180 mL 0  . ondansetron  (ZOFRAN-ODT) 8 MG disintegrating tablet Take 1 tablet (8 mg total) by mouth every 8 (eight) hours as needed for nausea. 20 tablet 3  . pantoprazole (PROTONIX) 40 MG tablet Take 1 tablet (40 mg total) by mouth daily. 90 tablet 1  .  Probiotic Product (PROBIOTIC DAILY PO) Take 1 capsule by mouth daily.    Marland Kitchen triamcinolone cream (KENALOG) 0.1 % Apply 1 application topically 2 (two) times daily. 30 g 0  . VOLTAREN 1 % GEL APPLY 2 GRAMS TOPICALLY 4 TIMES AS NEEDED 100 g 2  . Zoster Vaccine Live, PF, (ZOSTAVAX) 53202 UNT/0.65ML injection Inject 19,400 Units into the skin once. 1 each 0   No current facility-administered medications on file prior to visit.     BP 137/78 (BP Location: Right Arm, Patient Position: Sitting, Cuff Size: Large)   Pulse 89   Temp 98.6 F (37 C) (Oral)   Resp 16   Ht 5\' 2"  (1.575 m)   Wt 189 lb (85.7 kg)   SpO2 97%   BMI 34.57 kg/m       Objective:   Physical Exam Constitutional:      Appearance: She is well-developed.  HENT:     Right Ear: Tympanic membrane and ear canal normal.     Left Ear: Ear canal normal.  Neck:     Comments: No cervical lymphadenopathy is noted Cardiovascular:     Rate and Rhythm: Normal rate and regular rhythm.     Heart sounds: Normal heart sounds. No murmur.  Pulmonary:     Effort: Pulmonary effort is normal. No respiratory distress.     Breath sounds: Wheezing present.  Psychiatric:        Behavior: Behavior normal.        Thought Content: Thought content normal.        Judgment: Judgment normal.           Assessment & Plan:  Atypical chest pain- clinically I doubt pericarditis.  However, will obtain troponin/chest xray and 2D echo to further evaluate.  If + troponin, will need ED. EKG is performed today and notes some inferior ST elevation however when compared to previous EKG it is unchanged.  Normal sinus rhythm is noted.    Asthma exacerbation-  + wheezing today.  Suspect that this is cause for mild SOB/chest  heaviness. rx with albuterol and prednisone taper.

## 2018-08-30 ENCOUNTER — Telehealth: Payer: Self-pay | Admitting: *Deleted

## 2018-08-30 NOTE — Telephone Encounter (Signed)
Received Lab Report results from [facility]; forwarded to provider/SLS 01/06

## 2018-08-31 ENCOUNTER — Encounter: Payer: Self-pay | Admitting: Family

## 2018-08-31 ENCOUNTER — Ambulatory Visit (INDEPENDENT_AMBULATORY_CARE_PROVIDER_SITE_OTHER): Payer: Medicare Other | Admitting: Family

## 2018-08-31 VITALS — BP 134/77 | HR 79 | Temp 98.6°F | Resp 16 | Ht 62.0 in | Wt 190.0 lb

## 2018-08-31 DIAGNOSIS — R0789 Other chest pain: Secondary | ICD-10-CM

## 2018-08-31 NOTE — Progress Notes (Signed)
Subjective:    Patient ID: Sheryl [name], female    DOB: April 25, 1946, 73 y.o.   MRN: [id number]  HPI  Patient is a 73 yr old female who presents today for follow up. Of her atypical chest pain. Last visit we noted wheezing. She was treated with albuterol and a prednisone taper.   She reports that overall her symptoms are improved.  She notes that she feels fine until about 3 pm each day.  She notes that she has aching across her chest and down her neck. She reports that overall this is much better.   Review of Systems See HPI  Past Medical History:  Diagnosis Date  . Anemia    pernicious  . Aortic ectasia (HCC)    infrarenal abdomina aorta 2.6 cm  . Asthma   . Colon cancer ([place]) 2012  . Diabetes mellitus    type 2  . Fatty liver 01/19/2015  . 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 History of pericarditis 07/26/2012  . Hyperlipidemia   . Pericarditis 07/26/2012  . Rheumatoid arthritis(714.0)   . Sjogren's syndrome ([place]) 03/17/2011  . Small bowel obstruction ([place]) 03/2014   ?due to adhesions from colon surgery per pt  . Thyroid disease    hypothyroid  . Tobacco abuse      Social History   Socioeconomic History  . Marital status: Single    Spouse name: Not on file  . Number of children: 1  . Years of education: Not on file  . Highest education level: Not on file  Occupational History  . Occupation: unemployed    Employer: DISABLED  Social Needs  . Financial resource strain: Not on file  . Food insecurity:    Worry: Not on file    Inability: Not on file  . Transportation needs:    Medical: Not on file    Non-medical: Not on file  Tobacco Use  . Smoking status: Current Every Day Smoker    Types: Cigarettes  . Smokeless tobacco: Never Used  . Tobacco comment: 3 cigarettes per day, Uses smokeless cigarette.  Substance and Sexual Activity  . Alcohol use: No    Alcohol/week: 1.0 standard drinks    Types: 1 Glasses of wine per  week    Comment: Has not had alcohol since SBO 03/2014  . Drug use: Yes    Comment: Smokes "medical grade" marijuana 2 puffs 3 x weekly for rheumatoid arthritis.per pt  . Sexual activity: Never  Lifestyle  . Physical activity:    Days per week: Not on file    Minutes per session: Not on file  . Stress: Not on file  Relationships  . Social connections:    Talks on phone: Not on file    Gets together: Not on file    Attends religious service: Not on file    Active member of club or organization: Not on file    Attends meetings of clubs or organizations: Not on file    Relationship status: Not on file  . Intimate partner violence:    Fear of current or ex partner: Not on file    Emotionally abused: Not on file    Physically abused: Not on file    Forced sexual activity: Not on file  Other Topics Concern  . Not on file  Social History Narrative   Regular exercise:  Stretching exercises. Resistance bands   Caffeine: 1 mug (2cus) daily.  Past Surgical History:  Procedure Laterality Date  . CHOLECYSTECTOMY  10/23/2016  . COLON SURGERY  8/.23/13   tumor removed from sigmoid colon.  Marland Kitchen DILATION AND CURETTAGE OF UTERUS  1975  . HERNIA REPAIR  10/23/2016  . KNEE SURGERY  2006   arthroscopic left knee  . KNEE SURGERY  2001   right knee    Family History  Problem Relation Age of Onset  . Heart disease Other        CAD  . Diabetes Other   . Hyperlipidemia Other   . Stroke Other     Allergies  Allergen Reactions  . Levofloxacin Shortness Of Breath and Rash  . Mold Extract [Trichophyton] Anaphylaxis  . Ativan [Lorazepam]     Visual hallucinations.  Visual hallucinations.   . Cefuroxime Axetil     REACTION: asthma, cough  . Hydrocodone-Homatropine Nausea Only  . Rosuvastatin Calcium     Muscle pain, syncope  . Statins     Muscle pain, syncope  . Zetia [Ezetimibe] Diarrhea  . Hydrocodone-Acetaminophen Nausea Only    Current Outpatient Medications on File Prior  to Visit  Medication Sig Dispense Refill  . albuterol (VENTOLIN HFA) 108 (90 Base) MCG/ACT inhaler Inhale 2 puffs into the lungs every 6 (six) hours as needed for wheezing or shortness of breath. 54 g 1  . amoxicillin-clavulanate (AUGMENTIN) 875-125 MG tablet Take 1 tablet by mouth 2 (two) times daily. 20 tablet 0  . aspirin 81 MG tablet Take 81 mg by mouth daily.      . betamethasone dipropionate (DIPROLENE) 0.05 % cream Apply topically 2 (two) times daily. 30 g 1  . Cyanocobalamin (B-12) 1000 MCG CAPS Take 1,000 mcg by mouth daily.    Marland Kitchen estradiol (ESTRACE VAGINAL) 0.1 MG/GM vaginal cream 2g daily intravaginally for 1 week, then reduce to 1 gram PV daily for 1 week , followed by a maintenance dose of 1 g PV once weekly 42.5 g 5  . fluticasone (FLONASE) 50 MCG/ACT nasal spray Place 2 sprays into both nostrils daily as needed. 9.9 g 5  . fluticasone (FLONASE) 50 MCG/ACT nasal spray Place 2 sprays into both nostrils daily. 16 g 6  . Fluticasone-Umeclidin-Vilant (TRELEGY ELLIPTA) 100-62.5-25 MCG/INH AEPB Inhale 1 puff into the lungs daily. 3 each 3  . levothyroxine (SYNTHROID, LEVOTHROID) 100 MCG tablet Take 1 tablet (100 mcg total) by mouth daily. 90 tablet 1  . montelukast (SINGULAIR) 10 MG tablet Take 1 tablet (10 mg total) by mouth at bedtime. 90 tablet 1  . multivitamin (THERAGRAN) per tablet Take 1 tablet by mouth daily.      . mupirocin ointment (BACTROBAN) 2 % Apply to area twice daily 22 g 0  . NON FORMULARY TUMERIC MILK.    . NON FORMULARY CBD. Derivative of marijuana without the marijuana.    . nystatin (MYCOSTATIN) 100000 UNIT/ML suspension TAKE 5 ML BY MOUTH 4 TIMES A DAY (Patient taking differently: TAKE 5 ML BY MOUTH 4 TIMES A DAY as needed) 180 mL 0  . ondansetron (ZOFRAN-ODT) 8 MG disintegrating tablet Take 1 tablet (8 mg total) by mouth every 8 (eight) hours as needed for nausea. 20 tablet 3  . pantoprazole (PROTONIX) 40 MG tablet Take 1 tablet (40 mg total) by mouth daily. 90  tablet 1  . predniSONE (DELTASONE) 10 MG tablet 4 tabs by mouth once daily for 2 days, then 3 tabs daily x 2 days, then 2 tabs daily x 2 days, then 1 tab daily x  2 days 20 tablet 0  . Probiotic Product (PROBIOTIC DAILY PO) Take 1 capsule by mouth daily.    Marland Kitchen triamcinolone cream (KENALOG) 0.1 % Apply 1 application topically 2 (two) times daily. 30 g 0  . VOLTAREN 1 % GEL APPLY 2 GRAMS TOPICALLY 4 TIMES AS NEEDED 100 g 2  . Zoster Vaccine Live, PF, (ZOSTAVAX) 07680 UNT/0.65ML injection Inject 19,400 Units into the skin once. 1 each 0   No current facility-administered medications on file prior to visit.     BP 134/77 (BP Location: Left Arm, Patient Position: Sitting, Cuff Size: Large)   Pulse 79   Temp 98.6 F (37 C) (Oral)   Resp 16   Ht 5\' 2"  (1.575 m)   Wt 190 lb (86.2 kg)   SpO2 98%   BMI 34.75 kg/m       Objective:   Physical Exam Constitutional:      Appearance: She is well-developed.  Neck:     Musculoskeletal: Neck supple.     Thyroid: No thyromegaly.  Cardiovascular:     Rate and Rhythm: Normal rate and regular rhythm.     Heart sounds: Normal heart sounds. No murmur.  Pulmonary:     Effort: Pulmonary effort is normal. No respiratory distress.     Breath sounds: Normal breath sounds. No wheezing.  Skin:    General: Skin is warm and dry.  Neurological:     Mental Status: She is alert and oriented to person, place, and time.  Psychiatric:        Behavior: Behavior normal.        Thought Content: Thought content normal.        Judgment: Judgment normal.           Assessment & Plan:  Atypical chest pain- improving. She is scheduled for an echo tomorrow.  Pt is advised to call if symptoms do not continue to improve. I suspect that her asthma and her autoimmune disease were both contributing. I will refer her to cardiology as well for formal consultation.

## 2018-08-31 NOTE — Patient Instructions (Addendum)
Please complete echo as scheduled. You should be contacted about your consultation with cardiology. Let me know if your symptoms do not continue to improve.

## 2018-09-01 ENCOUNTER — Ambulatory Visit (HOSPITAL_BASED_OUTPATIENT_CLINIC_OR_DEPARTMENT_OTHER)
Admission: RE | Admit: 2018-09-01 | Discharge: 2018-09-01 | Disposition: A | Discharge status: Home / Self Care | Payer: Medicare Other | Source: Ambulatory Visit | Attending: Family | Admitting: Family

## 2018-09-01 DIAGNOSIS — R0789 Other chest pain: Secondary | ICD-10-CM | POA: Diagnosis not present

## 2018-09-01 NOTE — Progress Notes (Signed)
  Echocardiogram 2D Echocardiogram has been performed.  Kaedon Fanelli T Angelik Walls 09/01/2018, 12:02 PM

## 2018-09-02 ENCOUNTER — Telehealth: Payer: Self-pay | Admitting: Family

## 2018-09-02 DIAGNOSIS — I3139 Other pericardial effusion (noninflammatory): Secondary | ICD-10-CM

## 2018-09-02 DIAGNOSIS — I313 Pericardial effusion (noninflammatory): Secondary | ICD-10-CM

## 2018-09-02 NOTE — Telephone Encounter (Signed)
Patient advise of results and will f/up with cardiologist.

## 2018-09-02 NOTE — Telephone Encounter (Signed)
Please contact pt and let her know that her echo shows a very small amount of fluid near her heart.  This may always be there but given her recent symptoms would like to refer her to cardiology.  Referral placed.

## 2018-09-13 ENCOUNTER — Telehealth: Payer: Self-pay | Admitting: Emergency Medicine

## 2018-09-13 ENCOUNTER — Encounter: Payer: Self-pay | Admitting: Cardiology

## 2018-09-13 ENCOUNTER — Ambulatory Visit (INDEPENDENT_AMBULATORY_CARE_PROVIDER_SITE_OTHER): Payer: Medicare Other | Admitting: Cardiology

## 2018-09-13 VITALS — BP 118/60 | HR 90 | Ht 62.0 in | Wt 188.0 lb

## 2018-09-13 DIAGNOSIS — R0789 Other chest pain: Secondary | ICD-10-CM | POA: Diagnosis not present

## 2018-09-13 DIAGNOSIS — E663 Overweight: Secondary | ICD-10-CM

## 2018-09-13 DIAGNOSIS — K8020 Calculus of gallbladder without cholecystitis without obstruction: Secondary | ICD-10-CM | POA: Insufficient documentation

## 2018-09-13 DIAGNOSIS — K439 Ventral hernia without obstruction or gangrene: Secondary | ICD-10-CM | POA: Insufficient documentation

## 2018-09-13 DIAGNOSIS — I7090 Unspecified atherosclerosis: Secondary | ICD-10-CM

## 2018-09-13 DIAGNOSIS — E782 Mixed hyperlipidemia: Secondary | ICD-10-CM

## 2018-09-13 HISTORY — DX: Ventral hernia without obstruction or gangrene: K43.9

## 2018-09-13 HISTORY — DX: Calculus of gallbladder without cholecystitis without obstruction: K80.20

## 2018-09-13 HISTORY — DX: Overweight: E66.3

## 2018-09-13 HISTORY — DX: Unspecified atherosclerosis: I70.90

## 2018-09-13 NOTE — Telephone Encounter (Signed)
Before patient left her office visit she wanted some clarification on if she had pericarditis a second time and if her echocardiogram showed fluid build up again. Also going forward she would like to know specifically what she needs to do or be aware of since she is predisposed to pericarditis. She reports this was the whole reason of the visit and she would like to have these answers. Will route to Dr. Geraldo Pitter. Patient informed we will call her.

## 2018-09-13 NOTE — Progress Notes (Signed)
Cardiology Office Note:    Date:  09/13/2018   ID:  Sheryl [name], DOB 23-Nov-1945, MRN [id number]  PCP:  [name], NP  Cardiologist:  Jenean Lindau, MD   Referring MD: [name], NP    ASSESSMENT:    1. Chest discomfort   2. Atherosclerotic vascular disease   3. Overweight   4. Mixed dyslipidemia    PLAN:    In order of problems listed above:  1. Patient is currently asymptomatic.  It is possible that she had a attack of pericarditis though this history is not very convincing.  We will at this point monitor her closely.  She has history of some chest discomfort and with multiple risk factors we will do exercise stress echo. 2. Diet was discussed for dyslipidemia and obesity.  In view of her aortic atherosclerosis she could be considered for statin soon but we will give her a trial of diet and losing weight and she vocalized understanding. 3. Patient will be seen in follow-up appointment in 6 months or earlier if the patient has any concerns    Medication Adjustments/Labs and Tests Ordered: Current medicines are reviewed at length with the patient today.  Concerns regarding medicines are outlined above.  No orders of the defined types were placed in this encounter.  No orders of the defined types were placed in this encounter.    History of Present Illness:    Sheryl [name] is a 73 y.o. female who is being seen today for the evaluation of chest discomfort at the request of [name], NP.  Patient is a pleasant 73 year old female.  She has past medical history of rheumatoid arthritis.  She feels that she might have had pericarditis.  She is here for follow-up.  She mentions to me that she has chest tightness at times sometimes it goes to the neck.  This is not related to exertion.  No orthopnea or PND.  She does not exercise on a regular basis and is overweight and leads a sedentary lifestyle.  At the time of my evaluation, the  patient is alert awake oriented and in no distress.  Past Medical History:  Diagnosis Date  . Anemia    pernicious  . Aortic ectasia (HCC)    infrarenal abdomina aorta 2.6 cm  . Asthma   . Colon cancer ([place]) 2012  . Diabetes mellitus    type 2  . Fatty liver 01/19/2015  . 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 History of pericarditis 07/26/2012  . Hyperlipidemia   . Pericarditis 07/26/2012  . Rheumatoid arthritis(714.0)   . Sjogren's syndrome ([place]) 03/17/2011  . Small bowel obstruction ([place]) 03/2014   ?due to adhesions from colon surgery per pt  . Thyroid disease    hypothyroid  . Tobacco abuse     Past Surgical History:  Procedure Laterality Date  . CHOLECYSTECTOMY  10/23/2016  . COLON SURGERY  8/.23/13   tumor removed from sigmoid colon.  Marland Kitchen DILATION AND CURETTAGE OF UTERUS  1975  . HERNIA REPAIR  10/23/2016  . KNEE SURGERY  2006   arthroscopic left knee  . KNEE SURGERY  2001   right knee    Current Medications: Current Meds  Medication Sig  . albuterol (VENTOLIN HFA) 108 (90 Base) MCG/ACT inhaler Inhale 2 puffs into the lungs every 6 (six) hours as needed for wheezing or shortness of breath.  Marland Kitchen aspirin 81 MG tablet Take 81 mg by mouth daily.    Marland Kitchen  betamethasone dipropionate (DIPROLENE) 0.05 % cream Apply topically 2 (two) times daily.  . Cyanocobalamin (B-12) 1000 MCG CAPS Take 1,000 mcg by mouth daily.  Marland Kitchen estradiol (ESTRACE VAGINAL) 0.1 MG/GM vaginal cream 2g daily intravaginally for 1 week, then reduce to 1 gram PV daily for 1 week , followed by a maintenance dose of 1 g PV once weekly  . fluticasone (FLONASE) 50 MCG/ACT nasal spray Place 2 sprays into both nostrils daily as needed.  . Fluticasone-Umeclidin-Vilant (TRELEGY ELLIPTA) 100-62.5-25 MCG/INH AEPB Inhale 1 puff into the lungs daily.  Marland Kitchen levothyroxine (SYNTHROID, LEVOTHROID) 100 MCG tablet Take 1 tablet (100 mcg total) by mouth daily.  . montelukast (SINGULAIR) 10 MG tablet Take 1  tablet (10 mg total) by mouth at bedtime.  . multivitamin (THERAGRAN) per tablet Take 1 tablet by mouth daily.    . mupirocin ointment (BACTROBAN) 2 % Apply to area twice daily  . NON FORMULARY TUMERIC MILK.  . NON FORMULARY CBD. Derivative of marijuana without the marijuana.  . nystatin (MYCOSTATIN) 100000 UNIT/ML suspension TAKE 5 ML BY MOUTH 4 TIMES A DAY (Patient taking differently: TAKE 5 ML BY MOUTH 4 TIMES A DAY as needed)  . ondansetron (ZOFRAN-ODT) 8 MG disintegrating tablet Take 1 tablet (8 mg total) by mouth every 8 (eight) hours as needed for nausea.  . pantoprazole (PROTONIX) 40 MG tablet Take 1 tablet (40 mg total) by mouth daily.  . Probiotic Product (PROBIOTIC DAILY PO) Take 1 capsule by mouth daily.  Marland Kitchen triamcinolone cream (KENALOG) 0.1 % Apply 1 application topically 2 (two) times daily.  . VOLTAREN 1 % GEL APPLY 2 GRAMS TOPICALLY 4 TIMES AS NEEDED     Allergies:   Levofloxacin; Mold extract [trichophyton]; Ativan [lorazepam]; Cefuroxime axetil; Hydrocodone-homatropine; Rosuvastatin calcium; Statins; Zetia [ezetimibe]; and Hydrocodone-acetaminophen   Social History   Socioeconomic History  . Marital status: Single    Spouse name: Not on file  . Number of children: 1  . Years of education: Not on file  . Highest education level: Not on file  Occupational History  . Occupation: unemployed    Employer: DISABLED  Social Needs  . Financial resource strain: Not on file  . Food insecurity:    Worry: Not on file    Inability: Not on file  . Transportation needs:    Medical: Not on file    Non-medical: Not on file  Tobacco Use  . Smoking status: Current Every Day Smoker    Types: Cigarettes  . Smokeless tobacco: Never Used  . Tobacco comment: 3 cigarettes per day, Uses smokeless cigarette.  Substance and Sexual Activity  . Alcohol use: No    Alcohol/week: 1.0 standard drinks    Types: 1 Glasses of wine per week    Comment: Has not had alcohol since SBO 03/2014  .  Drug use: Yes    Comment: Smokes "medical grade" marijuana 2 puffs 3 x weekly for rheumatoid arthritis.per pt  . Sexual activity: Never  Lifestyle  . Physical activity:    Days per week: Not on file    Minutes per session: Not on file  . Stress: Not on file  Relationships  . Social connections:    Talks on phone: Not on file    Gets together: Not on file    Attends religious service: Not on file    Active member of club or organization: Not on file    Attends meetings of clubs or organizations: Not on file    Relationship status: Not  on file  Other Topics Concern  . Not on file  Social History Narrative   Regular exercise:  Stretching exercises. Resistance bands   Caffeine: 1 mug (2cus) daily.        Family History: The patient's family history includes Diabetes in an other family member; Heart disease in an other family member; Hyperlipidemia in an other family member; Stroke in an other family member.  ROS:   Please see the history of present illness.    All other systems reviewed and are negative.  EKGs/Labs/Other Studies Reviewed:    The following studies were reviewed today: I discussed my findings with the patient at length echocardiogram and EKG were unremarkable.  There is no convincing evidence here to diagnose this is pericarditis.   Recent Labs: 12/04/2017: ALT 39 05/24/2018: BUN 17; Creatinine, Ser 0.81; Potassium 4.5; Sodium 136; TSH 0.92 08/24/2018: Hemoglobin 15.6; Platelets 141  Recent Lipid Panel    Component Value Date/Time   CHOL 186 12/04/2017 1107   TRIG 202.0 (H) 12/04/2017 1107   HDL 30.70 (L) 12/04/2017 1107   CHOLHDL 6 12/04/2017 1107   VLDL 40.4 (H) 12/04/2017 1107   LDLCALC 123 (H) 12/19/2016 1128   LDLDIRECT 129.0 12/04/2017 1107    Physical Exam:    VS:  BP 118/60 (BP Location: Right Arm, Patient Position: Sitting, Cuff Size: Normal)   Pulse 90   Ht 5\' 2"  (1.575 m)   Wt 188 lb (85.3 kg)   SpO2 97%   BMI 34.39 kg/m     Wt  Readings from Last 3 Encounters:  09/13/18 188 lb (85.3 kg)  08/31/18 190 lb (86.2 kg)  08/24/18 189 lb (85.7 kg)     GEN: Patient is in no acute distress HEENT: Normal NECK: No JVD; No carotid bruits LYMPHATICS: No lymphadenopathy CARDIAC: S1 S2 regular, 2/6 systolic murmur at the apex. RESPIRATORY:  Clear to auscultation without rales, wheezing or rhonchi  ABDOMEN: Soft, non-tender, non-distended MUSCULOSKELETAL:  No edema; No deformity  SKIN: Warm and dry NEUROLOGIC:  Alert and oriented x 3 PSYCHIATRIC:  Normal affect    Signed, Jenean Lindau, MD  09/13/2018 11:50 AM    [facility]

## 2018-09-13 NOTE — Patient Instructions (Signed)
Medication Instructions:   Your physician recommends that you continue on your current medications as directed. Please refer to the Current Medication list given to you today.  If you need a refill on your cardiac medications before your next appointment, please call your pharmacy.   Lab work:  NONE  If you have labs (blood work) drawn today and your tests are completely normal, you will receive your results only by: Marland Kitchen MyChart Message (if you have MyChart) OR . A paper copy in the mail If you have any lab test that is abnormal or we need to change your treatment, we will call you to review the results.  Testing/Procedures:  Your physician has requested that you have a stress echocardiogram. For further information please visit [email or web address]. Please follow instruction sheet as given.    Follow-Up: At [facility], you and your health needs are our priority.  As part of our continuing mission to provide you with exceptional heart care, we have created designated Provider Care Teams.  These Care Teams include your primary Cardiologist (physician) and Advanced Practice Providers (APPs -  Physician Assistants and Nurse Practitioners) who all work together to provide you with the care you need, when you need it.   You will need a follow up appointment in 6 months.  Please call our office 2 months in advance to schedule this appointment.

## 2018-09-13 NOTE — Telephone Encounter (Signed)
I called and answered her questions.

## 2018-09-20 ENCOUNTER — Ambulatory Visit (HOSPITAL_BASED_OUTPATIENT_CLINIC_OR_DEPARTMENT_OTHER)
Admission: RE | Admit: 2018-09-20 | Discharge: 2018-09-20 | Disposition: A | Discharge status: Home / Self Care | Payer: Medicare Other | Source: Ambulatory Visit | Attending: Cardiology | Admitting: Cardiology

## 2018-09-20 DIAGNOSIS — I7090 Unspecified atherosclerosis: Secondary | ICD-10-CM | POA: Diagnosis not present

## 2018-09-20 DIAGNOSIS — R0789 Other chest pain: Secondary | ICD-10-CM | POA: Insufficient documentation

## 2018-09-20 MED ORDER — PERFLUTREN LIPID MICROSPHERE
1.0000 mL | INTRAVENOUS | Status: AC | PRN
  Administered 2018-09-20: 6 mL via INTRAVENOUS
  Filled 2018-09-20: qty 10

## 2018-09-20 NOTE — Progress Notes (Signed)
  Echocardiogram Echocardiogram Stress Test has been performed.  Yohance Hathorne T Perian Tedder 09/20/2018, 12:50 PM

## 2018-09-21 ENCOUNTER — Telehealth: Payer: Self-pay

## 2018-09-21 NOTE — Telephone Encounter (Signed)
Called patient and left detailed voice message on patients phone regarding test results. 

## 2018-09-21 NOTE — Telephone Encounter (Signed)
-----   Message from Jenean Lindau, MD sent at 09/20/2018  4:08 PM EST ----- The results of the study is unremarkable. Please inform patient. I will discuss in detail at next appointment. Cc  primary care/referring physician Jenean Lindau, MD 09/20/2018 4:08 PM

## 2018-11-03 ENCOUNTER — Telehealth: Payer: Self-pay

## 2018-11-03 NOTE — Telephone Encounter (Signed)
Copied from [place] [id number]. Topic: Referral - Request for Referral >> Oct 19, 2018 11:51 AM Bea Graff, NT wrote: Has patient seen PCP for this complaint? Yes.   *If NO, is insurance requiring patient see PCP for this issue before PCP can refer them? Referral for which specialty: ENT Preferred provider/office: Dr. Shanon Brow Moore-Otolaryngologist in [facility]. Phone: [phone] Reason for referral: Swollen lymph nodes  Patient did not follow up on this because she is not wanting to come out of her house much due to concerns with [place]. She will wait and call back if she wants to go see ent later on.

## 2018-11-03 NOTE — Telephone Encounter (Signed)
Lm for patient to call me back about this message

## 2018-11-22 ENCOUNTER — Ambulatory Visit: Payer: Medicare Other | Admitting: Family

## 2018-12-08 ENCOUNTER — Other Ambulatory Visit: Payer: Self-pay | Admitting: Family

## 2018-12-08 ENCOUNTER — Other Ambulatory Visit: Payer: Self-pay | Admitting: Adult Health

## 2018-12-08 MED ORDER — ALBUTEROL SULFATE HFA 108 (90 BASE) MCG/ACT IN AERS: 2.0000 | INHALATION_SPRAY | Freq: Four times a day (QID) | RESPIRATORY_TRACT | 1 refills | Status: DC | PRN

## 2018-12-08 NOTE — Telephone Encounter (Signed)
Please contact pt to arrange a virtual visit for follow up.

## 2018-12-08 NOTE — Telephone Encounter (Signed)
Requested Prescriptions  Pending Prescriptions Disp Refills  . levothyroxine (SYNTHROID, LEVOTHROID) 100 MCG tablet [Pharmacy Med Name: LEVOTHYROXIN TAB 0.1MG ] 90 tablet 1    Sig: TAKE 1 TABLET DAILY     Endocrinology:  Hypothyroid Agents Failed - 12/08/2018  3:03 PM      Failed - TSH needs to be rechecked within 3 months after an abnormal result. Refill until TSH is due.      Passed - TSH in normal range and within 360 days    TSH  Date Value Ref Range Status  05/24/2018 0.92 0.35 - 4.50 uIU/mL Final         Passed - Valid encounter within last 12 months    Recent Outpatient Visits          3 months ago Atypical chest pain   [facility] [place], NP   3 months ago Atypical chest pain   [facility] at [place], NP   4 months ago Acute sinusitis, recurrence not specified, unspecified location   [facility] [facility] [place], NP   6 months ago Controlled type 2 diabetes mellitus without complication, with long-term current use of insulin ([place])   [facility] at [place], NP   10 months ago [place] at [place], [employer]            In 5 months Vevelyn Royals, Parthenia Ames, Research scientist (physical sciences) at [employer], [place]         . pantoprazole (PROTONIX) 40 MG tablet [Pharmacy Med Name: PANTOPRAZOLE TAB 40MG  DR] 90 tablet 1    Sig: TAKE 1 TABLET DAILY     Gastroenterology: Proton Pump Inhibitors Passed - 12/08/2018  3:03 PM      Passed - Valid encounter within last 12 months    Recent Outpatient Visits          3 months ago Atypical chest pain   [facility] at [place], NP   3 months ago Atypical chest pain   [facility] at [place], NP   4 months ago Acute sinusitis, recurrence not specified, unspecified location   [facility] at [place], NP   6 months ago Controlled type 2 diabetes mellitus without complication, with long-term current use of insulin ([place])   [facility] at [place], NP   10 months ago [place] at [place], [employer]      Future Appointments            In 5 months Vevelyn Royals, Parthenia Ames, Research scientist (physical sciences) at [employer], [facility]         . montelukast (SINGULAIR) 10 MG tablet [employer] Med Name: MONTELUKAST  TAB 10MG ] 90 tablet 1    Sig: TAKE 1 TABLET AT BEDTIME     Pulmonology:  Leukotriene Inhibitors Passed - 12/08/2018  3:03 PM      Passed - Valid encounter within last 12 months    Recent Outpatient Visits          3 months ago Atypical chest pain   [facility] [employer]  [name], NP   3 months ago Atypical chest pain   [facility] at [place], NP   4 months ago Acute sinusitis, recurrence not specified, unspecified location   [facility] at [place], NP   6 months ago Controlled type 2 diabetes mellitus without complication, with long-term current use of insulin ([place])   [facility] at [place], NP   10 months ago [place] at [place], PA-C      Future Appointments            In 5 months Vevelyn Royals, Parthenia Ames, Research scientist (physical sciences) at [employer], [place]         . albuterol (VENTOLIN HFA) 108 (90 Base) MCG/ACT inhaler 54 g 1    Sig: Inhale 2 puffs into the lungs every 6 (six) hours as needed for wheezing or  shortness of breath.     Pulmonology:  Beta Agonists Failed - 12/08/2018  3:03 PM      Failed - One inhaler should last at least one month. If the patient is requesting refills earlier, contact the patient to check for uncontrolled symptoms.      Passed - Valid encounter within last 12 months    Recent Outpatient Visits          3 months ago Atypical chest pain   [facility] at [place], NP   3 months ago Atypical chest pain   [facility] at [place], NP   4 months ago Acute sinusitis, recurrence not specified, unspecified location   [facility] [facility] at [place], NP   6 months ago Controlled type 2 diabetes mellitus without complication, with long-term current use of insulin ([place])   [facility] at [place], NP   10 months ago [place] at [place], [employer]      Future Appointments            In 5 months Vevelyn Royals, Parthenia Ames, Research scientist (physical sciences) at [employer], [place]

## 2018-12-10 ENCOUNTER — Other Ambulatory Visit: Payer: Self-pay | Admitting: Family

## 2018-12-10 NOTE — Telephone Encounter (Signed)
Requested medication (s) are due for refill today: yes  Requested medication (s) are on the active medication list: no  Last refill:  08/24/18  Future visit scheduled: no  Notes to clinic:  Medication not delegated to NT to refill Called pt and she stated she was doing fine. Had to use her inhaler one time and was hoping to get a prescription now so she can use it later when she needs it. Pt in distress or wheezing.    Requested Prescriptions  Pending Prescriptions Disp Refills   predniSONE (DELTASONE) 10 MG tablet 20 tablet     Sig: 4 tabs by mouth once daily for 2 days, then 3 tabs daily x 2 days, then 2 tabs daily x 2 days, then 1 tab daily x 2 days     Not Delegated - Endocrinology:  Oral Corticosteroids Failed - 12/10/2018  4:18 PM      Failed - This refill cannot be delegated      Passed - Last BP in normal range    BP Readings from Last 1 Encounters:  09/13/18 118/60         Passed - Valid encounter within last 6 months    Recent Outpatient Visits          3 months ago Atypical chest pain   [facility] at [place], NP   3 months ago Atypical chest pain   [facility] at [place], NP   4 months ago Acute sinusitis, recurrence not specified, unspecified location   [facility] [facility] at [place], NP   6 months ago Controlled type 2 diabetes mellitus without complication, with long-term current use of insulin ([place])   [facility] at [place], NP   11 months ago [place] at [place], PA-C      Future Appointments            In 5 months Vevelyn Royals, [place], Research scientist (physical sciences) at [employer], [facility]         Refused Prescriptions Disp Refills   albuterol (VENTOLIN HFA) 108 (90 Base) MCG/ACT inhaler 54 g      Sig: Inhale 2 puffs into the lungs every 6 (six) hours as needed for wheezing or shortness of breath.     Pulmonology:  Beta Agonists Failed - 12/10/2018  4:18 PM      Failed - One inhaler should last at least one month. If the patient is requesting refills earlier, contact the patient to check for uncontrolled symptoms.      Passed - Valid encounter within last 12 months    Recent Outpatient Visits          3 months ago Atypical chest pain   [facility] at [place], NP   3 months ago Atypical chest pain   [facility] at [place], NP   4 months ago Acute sinusitis, recurrence not specified, unspecified location   [facility] [facility] at [place], NP   6 months ago Controlled type 2 diabetes mellitus without complication, with long-term current use of insulin ([place])   [facility] at [place], NP   11 months ago [place] at [employer]  [place], [place], [employer]            In 5 months Vevelyn Royals, Parthenia Ames, Research scientist (physical sciences) at [employer], [place]

## 2018-12-15 NOTE — Telephone Encounter (Signed)
Lm advised patient if she is having symptoms she will need a virtual visit.

## 2018-12-15 NOTE — Telephone Encounter (Signed)
lvm for patient to call back and arrange f/up visit

## 2018-12-17 ENCOUNTER — Ambulatory Visit (INDEPENDENT_AMBULATORY_CARE_PROVIDER_SITE_OTHER): Payer: Medicare Other | Admitting: Family

## 2018-12-17 ENCOUNTER — Other Ambulatory Visit: Payer: Self-pay

## 2018-12-17 DIAGNOSIS — K219 Gastro-esophageal reflux disease without esophagitis: Secondary | ICD-10-CM | POA: Diagnosis not present

## 2018-12-17 DIAGNOSIS — J4520 Mild intermittent asthma, uncomplicated: Secondary | ICD-10-CM

## 2018-12-17 DIAGNOSIS — J45909 Unspecified asthma, uncomplicated: Secondary | ICD-10-CM

## 2018-12-17 DIAGNOSIS — E039 Hypothyroidism, unspecified: Secondary | ICD-10-CM

## 2018-12-17 MED ORDER — FLUTICASONE PROPIONATE 50 MCG/ACT NA SUSP: 2.0000 | Freq: Every day | NASAL | 1 refills | Status: DC | PRN

## 2018-12-17 MED ORDER — PREDNISONE 10 MG PO TABS: ORAL_TABLET | ORAL | 0 refills | Status: DC

## 2018-12-17 NOTE — Progress Notes (Signed)
Virtual Visit via Video Note  I connected with Sheryl [name]  on 12/17/18 at  1:00 PM EDT by a video enabled telemedicine application and verified that I am speaking with the correct person using two identifiers. This visit type was conducted due to national recommendations for restrictions regarding the COVID-19 Pandemic (e.g. social distancing).  This format is felt to be most appropriate for this patient at this time.   I discussed the limitations of evaluation and management by telemedicine and the availability of in person appointments. The patient expressed understanding and agreed to proceed.  Only the patient and myself were on today's video visit. The patient was at home and I was in my office at the time of today's visit.   History of Present Illness:  Patient is a 73 yr old female who presents today for follow up.   Reports that allergies have been stable. On singulair, flonase and zyrtec.   Hypothyroid- she reports that she feels well on synthroid.  Lab Results  Component Value Date   TSH 0.92 05/24/2018   Asthma- on trelegy, albuterol and singulair.  Patient reports that she is worried that if she gets a really severe asthma attack she would like to have prednisone on hand.    GERD- reports symptoms are stable on protonix.   Observations/Objective:  Gen: Awake, alert, no acute distress Resp: Breathing is even and non-labored Psych: calm/pleasant demeanor Neuro: Alert and Oriented x 3, + facial symmetry, speech is clear.    Assessment and Plan:  Asthma- stable on current regimen. I did sent a steroid taper in for her in case she develops acute asthma exacerbation.    Allergic rhinitis- stable on current regimen, continue same.  Hypothyroid- clinically stable.  Continue current dose of synthroid. Repeat TSH when safe to bring her into the office.   GERD- stable on PPI, continue same.  Follow Up Instructions:   Asthma- fair control.  I discussed the  assessment and treatment plan with the patient. The patient was provided an opportunity to ask questions and all were answered. The patient agreed with the plan and demonstrated an understanding of the instructions.   The patient was advised to call back or seek an in-person evaluation if the symptoms worsen or if the condition fails to improve as anticipated.    [name], NP

## 2019-01-27 ENCOUNTER — Other Ambulatory Visit: Payer: Self-pay

## 2019-01-27 MED ORDER — FLUTICASONE PROPIONATE 50 MCG/ACT NA SUSP: 2.0000 | Freq: Every day | NASAL | 1 refills | Status: DC | PRN

## 2019-04-28 ENCOUNTER — Other Ambulatory Visit: Payer: Self-pay

## 2019-04-29 ENCOUNTER — Encounter: Payer: Self-pay | Admitting: Family

## 2019-04-29 ENCOUNTER — Ambulatory Visit (INDEPENDENT_AMBULATORY_CARE_PROVIDER_SITE_OTHER): Payer: Medicare Other | Admitting: Family

## 2019-04-29 VITALS — BP 135/82 | HR 84 | Temp 96.0°F | Resp 16 | Ht 62.4 in | Wt 184.2 lb

## 2019-04-29 DIAGNOSIS — R739 Hyperglycemia, unspecified: Secondary | ICD-10-CM | POA: Diagnosis not present

## 2019-04-29 DIAGNOSIS — L304 Erythema intertrigo: Secondary | ICD-10-CM

## 2019-04-29 DIAGNOSIS — Z23 Encounter for immunization: Secondary | ICD-10-CM

## 2019-04-29 DIAGNOSIS — E039 Hypothyroidism, unspecified: Secondary | ICD-10-CM | POA: Diagnosis not present

## 2019-04-29 MED ORDER — NYSTATIN 100000 UNIT/GM EX POWD: Freq: Four times a day (QID) | CUTANEOUS | 1 refills | Status: DC

## 2019-04-29 MED ORDER — PREDNISONE 10 MG PO TABS: ORAL_TABLET | ORAL | 0 refills | Status: DC

## 2019-04-29 NOTE — Progress Notes (Signed)
Subjective:    Patient ID: Sheryl [name], female    DOB: [birth year], 73 y.o.   MRN: [id number]  HPI  Patient is a 73 year old female who presents today for routine follow-up.  Asthma- patient notes some chest congestion.  Current medications include Trelegy Ellipta, Singulair, and albuterol Feels like asthma has been flaring up for the last 3 week. Reports that she took prednisone 2 weeks ago.  X 5 days in all. She took 20mg , then 10mg  once daily for 4 more daily. That helped her symptoms. Requests refill of prednisone to have on hand.   Notes that she had associated nasal congestion.  Denies sinus pain.  Taking flonase, zyrtec, singulair.  Hypothyroid- Reports that she feels OK on current dose of synthroid.   Lab Results  Component Value Date   TSH 0.92 05/24/2018     Review of Systems See HPI  Past Medical History:  Diagnosis Date  . Anemia    pernicious  . Aortic ectasia (HCC)    infrarenal abdomina aorta 2.6 cm  . Asthma   . Colon cancer ([place]) 2012  . Diabetes mellitus    type 2  . Fatty liver 01/19/2015  . 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 History of pericarditis 07/26/2012  . Hyperlipidemia   . Pericarditis 07/26/2012  . Rheumatoid arthritis(714.0)   . Sjogren's syndrome ([place]) 03/17/2011  . Small bowel obstruction ([place]) 03/2014   ?due to adhesions from colon surgery per pt  . Thyroid disease    hypothyroid  . Tobacco abuse      Social History   Socioeconomic History  . Marital status: Single    Spouse name: Not on file  . Number of children: 1  . Years of education: Not on file  . Highest education level: Not on file  Occupational History  . Occupation: unemployed    Employer: DISABLED  Social Needs  . Financial resource strain: Not on file  . Food insecurity    Worry: Not on file    Inability: Not on file  . Transportation needs    Medical: Not on file    Non-medical: Not on file  Tobacco Use  .  Smoking status: Current Every Day Smoker    Types: Cigarettes  . Smokeless tobacco: Never Used  . Tobacco comment: 3 cigarettes per day, Uses smokeless cigarette.  Substance and Sexual Activity  . Alcohol use: No    Alcohol/week: 1.0 standard drinks    Types: 1 Glasses of wine per week    Comment: Has not had alcohol since SBO 03/2014  . Drug use: Yes    Comment: Smokes "medical grade" marijuana 2 puffs 3 x weekly for rheumatoid arthritis.per pt  . Sexual activity: Never  Lifestyle  . Physical activity    Days per week: Not on file    Minutes per session: Not on file  . Stress: Not on file  Relationships  . Social Herbalist on phone: Not on file    Gets together: Not on file    Attends religious service: Not on file    Active member of club or organization: Not on file    Attends meetings of clubs or organizations: Not on file    Relationship status: Not on file  . Intimate partner violence    Fear of current or ex partner: Not on file    Emotionally abused: Not on file    Physically abused: Not on file  Forced sexual activity: Not on file  Other Topics Concern  . Not on file  Social History Narrative   Regular exercise:  Stretching exercises. Resistance bands   Caffeine: 1 mug (2cus) daily.       Past Surgical History:  Procedure Laterality Date  . CHOLECYSTECTOMY  10/23/2016  . COLON SURGERY  8/.23/13   tumor removed from sigmoid colon.  Marland Kitchen DILATION AND CURETTAGE OF UTERUS  1975  . HERNIA REPAIR  10/23/2016  . KNEE SURGERY  2006   arthroscopic left knee  . KNEE SURGERY  2001   right knee    Family History  Problem Relation Age of Onset  . Heart disease Other        CAD  . Diabetes Other   . Hyperlipidemia Other   . Stroke Other     Allergies  Allergen Reactions  . Levofloxacin Shortness Of Breath and Rash  . Mold Extract [Trichophyton] Anaphylaxis  . Ativan [Lorazepam]     Visual hallucinations.  Visual hallucinations.   . Cefuroxime  Axetil     REACTION: asthma, cough  . Hydrocodone-Homatropine Nausea Only  . Rosuvastatin Calcium     Muscle pain, syncope  . Statins     Muscle pain, syncope  . Zetia [Ezetimibe] Diarrhea  . Hydrocodone-Acetaminophen Nausea Only    Current Outpatient Medications on File Prior to Visit  Medication Sig Dispense Refill  . albuterol (VENTOLIN HFA) 108 (90 Base) MCG/ACT inhaler Inhale 2 puffs into the lungs every 6 (six) hours as needed for wheezing or shortness of breath. 54 g 1  . aspirin 81 MG tablet Take 81 mg by mouth daily.      . betamethasone dipropionate (DIPROLENE) 0.05 % cream Apply topically 2 (two) times daily. 30 g 1  . Cyanocobalamin (B-12) 1000 MCG CAPS Take 1,000 mcg by mouth daily.    Marland Kitchen estradiol (ESTRACE VAGINAL) 0.1 MG/GM vaginal cream 2g daily intravaginally for 1 week, then reduce to 1 gram PV daily for 1 week , followed by a maintenance dose of 1 g PV once weekly 42.5 g 5  . fluticasone (FLONASE) 50 MCG/ACT nasal spray Place 2 sprays into both nostrils daily as needed. 29.7 g 1  . levothyroxine (SYNTHROID, LEVOTHROID) 100 MCG tablet TAKE 1 TABLET DAILY 90 tablet 1  . montelukast (SINGULAIR) 10 MG tablet TAKE 1 TABLET AT BEDTIME 90 tablet 1  . multivitamin (THERAGRAN) per tablet Take 1 tablet by mouth daily.      . NON FORMULARY TUMERIC MILK.    . NON FORMULARY CBD. Derivative of marijuana without the marijuana.    . nystatin (MYCOSTATIN) 100000 UNIT/ML suspension TAKE 5 ML BY MOUTH 4 TIMES A DAY (Patient taking differently: TAKE 5 ML BY MOUTH 4 TIMES A DAY as needed) 180 mL 0  . pantoprazole (PROTONIX) 40 MG tablet TAKE 1 TABLET DAILY 90 tablet 1  . predniSONE (DELTASONE) 10 MG tablet 4 tabs by mouth once daily for 2 days, then 3 tabs daily x 2 days, then 2 tabs daily x 2 days, then 1 tab daily x 2 days 20 tablet 0  . Probiotic Product (PROBIOTIC DAILY PO) Take 1 capsule by mouth daily.    . TRELEGY ELLIPTA 100-62.5-25 MCG/INH AEPB USE 1 INHALATION ORALLY    DAILY 180  each 3  . VOLTAREN 1 % GEL APPLY 2 GRAMS TOPICALLY 4 TIMES AS NEEDED 100 g 2   No current facility-administered medications on file prior to visit.     BP  135/82   Pulse 84   Temp (!) 96 F (35.6 C) (Temporal)   Resp 16   Ht 5' 2.4" (1.585 m)   Wt 184 lb 3.2 oz (83.6 kg)   SpO2 98%   BMI 33.26 kg/m       Objective:   Physical Exam Constitutional:      Appearance: She is well-developed.  Neck:     Musculoskeletal: Neck supple.     Thyroid: No thyromegaly.  Cardiovascular:     Rate and Rhythm: Normal rate and regular rhythm.     Heart sounds: Normal heart sounds. No murmur.  Pulmonary:     Effort: Pulmonary effort is normal. No respiratory distress.     Breath sounds: Normal breath sounds. No wheezing.  Skin:    General: Skin is warm and dry.     Comments: Fungal rash noted bilateral axilla  Neurological:     Mental Status: She is alert and oriented to person, place, and time.  Psychiatric:        Behavior: Behavior normal.        Thought Content: Thought content normal.        Judgment: Judgment normal.           Assessment & Plan:  Asthma- lungs sound clear today. Continue current meds. Refill of pred taper given to pt to have on hand.  [facility]- rx for nystatin powder provided.  Hypothyroid- stable on synthroid. Obtain follow up TSH.

## 2019-04-29 NOTE — Patient Instructions (Signed)
Please complete lab work prior to leaving.   

## 2019-04-30 LAB — HEMOGLOBIN A1C
Hgb A1c MFr Bld: 6.1 % of total Hgb — ABNORMAL HIGH (ref <5.7)
Mean Plasma Glucose: 128 (calc)
eAG (mmol/L): 7.1 (calc)

## 2019-04-30 LAB — BASIC METABOLIC PANEL
BUN: 16 mg/dL (ref 7–25)
CO2: 24 mmol/L (ref 20–32)
Calcium: 9.1 mg/dL (ref 8.6–10.4)
Chloride: 103 mmol/L (ref 98–110)
Creat: 0.89 mg/dL (ref 0.60–0.93)
Glucose, Bld: 103 mg/dL — ABNORMAL HIGH (ref 65–99)
Potassium: 4.7 mmol/L (ref 3.5–5.3)
Sodium: 137 mmol/L (ref 135–146)

## 2019-04-30 LAB — TSH: TSH: 0.95 mIU/L (ref 0.40–4.50)

## 2019-05-04 ENCOUNTER — Encounter: Payer: Self-pay | Admitting: Family

## 2019-05-20 NOTE — Progress Notes (Deleted)
Virtual Visit via Video Note  I connected with patient on 05/23/19 at 10:00 AM EDT by audio enabled telemedicine application and verified that I am speaking with the correct person using two identifiers.   THIS ENCOUNTER IS A VIRTUAL VISIT DUE TO COVID-19 - PATIENT WAS NOT SEEN IN THE OFFICE. PATIENT HAS CONSENTED TO VIRTUAL VISIT / TELEMEDICINE VISIT   Location of patient: home  Location of provider: office  I discussed the limitations of evaluation and management by telemedicine and the availability of in person appointments. The patient expressed understanding and agreed to proceed.   Subjective:   STEVE SCHEIBLE is a 73 y.o. female who presents for Medicare Annual (Subsequent) preventive examination.  Review of Systems:  Home Safety/Smoke Alarms: Feels safe in home. Smoke alarms in place.  Lives alone in 1 story home. Has alarm system.   Female:      Mammo-11/03/17       Dexa scan- 11/03/17     CCS-01/02/15    Objective:     Vitals: There were no vitals taken for this visit.  There is no height or weight on file to calculate BMI.  Advanced Directives 05/20/2018 05/19/2017 09/20/2015  Does Patient Have a Medical Advance Directive? Yes Yes Yes  Type of Paramedic of [place];Living will Living will [place];Living will  Does patient want to make changes to medical advance directive? - No - Patient declined -  Copy of [place] in Chart? No - copy requested - -    Tobacco Social History   Tobacco Use  Smoking Status Current Every Day Smoker  . Types: Cigarettes  Smokeless Tobacco Never Used  Tobacco Comment   3 cigarettes per day, Uses smokeless cigarette.     Ready to quit: Not Answered Counseling given: Not Answered Comment: 3 cigarettes per day, Uses smokeless cigarette.   Clinical Intake:                       Past Medical History:  Diagnosis Date  . Anemia    pernicious  .  Aortic ectasia (HCC)    infrarenal abdomina aorta 2.6 cm  . Asthma   . Colon cancer ([place]) 2012  . Diabetes mellitus    type 2  . Fatty liver 01/19/2015  . 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 History of pericarditis 07/26/2012  . Hyperlipidemia   . Pericarditis 07/26/2012  . Rheumatoid arthritis(714.0)   . Sjogren's syndrome ([place]) 03/17/2011  . Small bowel obstruction ([place]) 03/2014   ?due to adhesions from colon surgery per pt  . Thyroid disease    hypothyroid  . Tobacco abuse    Past Surgical History:  Procedure Laterality Date  . CHOLECYSTECTOMY  10/23/2016  . COLON SURGERY  8/.23/13   tumor removed from sigmoid colon.  Marland Kitchen DILATION AND CURETTAGE OF UTERUS  1975  . HERNIA REPAIR  10/23/2016  . KNEE SURGERY  2006   arthroscopic left knee  . KNEE SURGERY  2001   right knee   Family History  Problem Relation Age of Onset  . Heart disease Other        CAD  . Diabetes Other   . Hyperlipidemia Other   . Stroke Other    Social History   Socioeconomic History  . Marital status: Single    Spouse name: Not on file  . Number of children: 1  . Years of education: Not  on file  . Highest education level: Not on file  Occupational History  . Occupation: unemployed    Employer: DISABLED  Social Needs  . Financial resource strain: Not on file  . Food insecurity    Worry: Not on file    Inability: Not on file  . Transportation needs    Medical: Not on file    Non-medical: Not on file  Tobacco Use  . Smoking status: Current Every Day Smoker    Types: Cigarettes  . Smokeless tobacco: Never Used  . Tobacco comment: 3 cigarettes per day, Uses smokeless cigarette.  Substance and Sexual Activity  . Alcohol use: No    Alcohol/week: 1.0 standard drinks    Types: 1 Glasses of wine per week    Comment: Has not had alcohol since SBO 03/2014  . Drug use: Yes    Comment: Smokes "medical grade" marijuana 2 puffs 3 x weekly for rheumatoid arthritis.per  pt  . Sexual activity: Never  Lifestyle  . Physical activity    Days per week: Not on file    Minutes per session: Not on file  . Stress: Not on file  Relationships  . Social Herbalist on phone: Not on file    Gets together: Not on file    Attends religious service: Not on file    Active member of club or organization: Not on file    Attends meetings of clubs or organizations: Not on file    Relationship status: Not on file  Other Topics Concern  . Not on file  Social History Narrative   Regular exercise:  Stretching exercises. Resistance bands   Caffeine: 1 mug (2cus) daily.       Outpatient Encounter Medications as of 05/23/2019  Medication Sig  . albuterol (VENTOLIN HFA) 108 (90 Base) MCG/ACT inhaler Inhale 2 puffs into the lungs every 6 (six) hours as needed for wheezing or shortness of breath.  Marland Kitchen aspirin 81 MG tablet Take 81 mg by mouth daily.    . betamethasone dipropionate (DIPROLENE) 0.05 % cream Apply topically 2 (two) times daily.  . Cyanocobalamin (B-12) 1000 MCG CAPS Take 1,000 mcg by mouth daily.  Marland Kitchen estradiol (ESTRACE VAGINAL) 0.1 MG/GM vaginal cream 2g daily intravaginally for 1 week, then reduce to 1 gram PV daily for 1 week , followed by a maintenance dose of 1 g PV once weekly  . fluticasone (FLONASE) 50 MCG/ACT nasal spray Place 2 sprays into both nostrils daily as needed.  Marland Kitchen levothyroxine (SYNTHROID, LEVOTHROID) 100 MCG tablet TAKE 1 TABLET DAILY  . montelukast (SINGULAIR) 10 MG tablet TAKE 1 TABLET AT BEDTIME  . multivitamin (THERAGRAN) per tablet Take 1 tablet by mouth daily.    . NON FORMULARY TUMERIC MILK.  . NON FORMULARY CBD. Derivative of marijuana without the marijuana.  . nystatin (MYCOSTATIN) 100000 UNIT/ML suspension TAKE 5 ML BY MOUTH 4 TIMES A DAY (Patient taking differently: TAKE 5 ML BY MOUTH 4 TIMES A DAY as needed)  . nystatin (MYCOSTATIN/NYSTOP) powder Apply topically 4 (four) times daily.  . pantoprazole (PROTONIX) 40 MG tablet  TAKE 1 TABLET DAILY  . predniSONE (DELTASONE) 10 MG tablet 4 tabs by mouth once daily for 2 days, then 3 tabs daily x 2 days, then 2 tabs daily x 2 days, then 1 tab daily x 2 days  . Probiotic Product (PROBIOTIC DAILY PO) Take 1 capsule by mouth daily.  . TRELEGY ELLIPTA 100-62.5-25 MCG/INH AEPB USE 1 INHALATION ORALLY  DAILY  . VOLTAREN 1 % GEL APPLY 2 GRAMS TOPICALLY 4 TIMES AS NEEDED   No facility-administered encounter medications on file as of 05/23/2019.     Activities of Daily Living In your present state of health, do you have any difficulty performing the following activities: 05/20/2018  Hearing? N  Vision? N  Difficulty concentrating or making decisions? N  Walking or climbing stairs? N  Dressing or bathing? N  Preparing Food and eating ? N  Using the Toilet? N  In the past six months, have you accidently leaked urine? N  Do you have problems with loss of bowel control? N  Managing your Medications? N  Managing your Finances? N  Housekeeping or managing your Housekeeping? N  Some recent data might be hidden    Patient Care Team: [name], NP as PCP - General Kerin Salen, DDS as Consulting Physician ([place]) [place] as Consulting Physician (Ophthalmology) Lake Bells., MD as Consulting Physician (Gastroenterology) Brooks Sailors, MD as Consulting Physician (Surgery) Zebedee Iba., MD as Consulting Physician (Hematology and Oncology)    Assessment:   This is a routine wellness examination for [place].  Exercise Activities and Dietary recommendations    Goals    . Exercise 3x per week (15 min per time)     Increase walking as tolerated. Continue chair exercises and increase as tolerated.       Fall Risk Fall Risk  05/20/2018 07/30/2017 05/19/2017 03/03/2016 01/08/2015  Falls in the past year? No No No No No  Comment - Emmi Telephone Survey: data to providers prior to load - - -   Is the patient's home free of  loose throw rugs in walkways, pet beds, electrical cords, etc?   {Blank single:19197::"yes","no"}      Grab bars in the bathroom? {Blank single:19197::"yes","no"}      Handrails on the stairs?   {Blank single:19197::"yes","no"}      Adequate lighting?   {Blank single:19197::"yes","no"}  Timed Get Up and Go performed: ***  Depression Screen PHQ 2/9 Scores 05/20/2018 05/19/2017 03/03/2016 01/08/2015  PHQ - 2 Score 0 0 0 1     Cognitive Function        Immunization History  Administered Date(s) Administered  . Fluad Quad(high Dose 65+) 04/29/2019  . Influenza Split 07/07/2011  . Influenza Whole 06/05/2009, 04/24/2010  . Influenza, High Dose Seasonal PF 06/29/2015, 04/30/2016, 05/19/2017, 05/24/2018  . Influenza,inj,Quad PF,6+ Mos 06/01/2013, 05/25/2014  . Pneumococcal Conjugate-13 06/01/2013  . Pneumococcal Polysaccharide-23 10/01/2010, 09/12/2016  . Td 08/25/2002  . Tdap 02/14/2013    Qualifies for Shingles Vaccine?***  Screening Tests Health Maintenance  Topic Date Due  . URINE MICROALBUMIN  08/02/2016  . OPHTHALMOLOGY EXAM  12/23/2016  . FOOT EXAM  05/25/2019  . HEMOGLOBIN A1C  10/27/2019  . MAMMOGRAM  11/04/2019  . TETANUS/TDAP  02/15/2023  . COLONOSCOPY  01/01/2025  . INFLUENZA VACCINE  Completed  . DEXA SCAN  Completed  . Hepatitis C Screening  Completed  . PNA vac Low Risk Adult  Completed    Cancer Screenings: Lung: Low Dose CT Chest recommended if Age 42-80 years, 30 pack-year currently smoking OR have quit w/in 15years. Patient {DOES NOT does:27190::"does not"} qualify. Breast:  Up to date on Mammogram? {Yes/No:30480221}   Up to date of Bone Density/Dexa? {Yes/No:30480221} Colorectal: ***  Additional Screenings: ***: Hepatitis C Screening:      Plan:   ***   I have personally reviewed and noted the following in  the patient's chart:   . Medical and social history . Use of alcohol, tobacco or illicit drugs  . Current medications and supplements .  Functional ability and status . Nutritional status . Physical activity . Advanced directives . List of other physicians . Hospitalizations, surgeries, and ER visits in previous 12 months . Vitals . Screenings to include cognitive, depression, and falls . Referrals and appointments  In addition, I have reviewed and discussed with patient certain preventive protocols, quality metrics, and best practice recommendations. A written personalized care plan for preventive services as well as general preventive health recommendations were provided to patient.     Naaman Plummer [place], [place]  05/20/2019

## 2019-05-23 ENCOUNTER — Ambulatory Visit: Payer: Medicare Other | Admitting: *Deleted

## 2019-05-23 ENCOUNTER — Other Ambulatory Visit: Payer: Self-pay

## 2019-05-25 ENCOUNTER — Ambulatory Visit (INDEPENDENT_AMBULATORY_CARE_PROVIDER_SITE_OTHER): Payer: Medicare Other | Admitting: Medical

## 2019-05-25 ENCOUNTER — Other Ambulatory Visit: Payer: Self-pay

## 2019-05-25 ENCOUNTER — Encounter: Payer: Self-pay | Admitting: Medical

## 2019-05-25 VITALS — Temp 100.2°F | Ht 62.4 in | Wt 182.0 lb

## 2019-05-25 DIAGNOSIS — R509 Fever, unspecified: Secondary | ICD-10-CM | POA: Diagnosis not present

## 2019-05-25 DIAGNOSIS — R059 Cough, unspecified: Secondary | ICD-10-CM

## 2019-05-25 DIAGNOSIS — R062 Wheezing: Secondary | ICD-10-CM

## 2019-05-25 DIAGNOSIS — R05 Cough: Secondary | ICD-10-CM | POA: Diagnosis not present

## 2019-05-25 DIAGNOSIS — Z20828 Contact with and (suspected) exposure to other viral communicable diseases: Secondary | ICD-10-CM | POA: Diagnosis not present

## 2019-05-25 DIAGNOSIS — J40 Bronchitis, not specified as acute or chronic: Secondary | ICD-10-CM

## 2019-05-25 MED ORDER — BENZONATATE 100 MG PO CAPS: 100.0000 mg | ORAL_CAPSULE | Freq: Three times a day (TID) | ORAL | 0 refills | Status: DC | PRN

## 2019-05-25 MED ORDER — AZITHROMYCIN 250 MG PO TABS: ORAL_TABLET | ORAL | 0 refills | Status: DC

## 2019-05-25 MED FILL — BENZONATATE 100 MG CAPS: 100 | 10 days supply | Qty: 30 | Fill #0

## 2019-05-25 MED FILL — AZITHROMYCIN 250 MG TABLET: 250 | 5 days supply | Qty: 6 | Fill #0

## 2019-05-25 NOTE — Patient Instructions (Signed)
Your have hx of asthma with recent fever and cough. Onset of symptoms past 24 hours.'  In light of current pandemic best to get you tested tomorrow. Order placed.  Also will go ahead and send in azithromycin antibiotic and benzonatate cough tab. This can treat bronchitis and has coverage for walking pneumonia.   Continue tapered prednisone and current inhalers.  Follow up in 7 days or as needed

## 2019-05-25 NOTE — Progress Notes (Signed)
Subjective:    Patient ID: Sheryl [name], female    DOB: 07/26/1946, 73 y.o.   MRN: [id number]  HPI  Virtual Visit via Telephone Note  I connected with AYELENE BULLINGER on 05/25/19 at  4:00 PM EDT by telephone and verified that I am speaking with the correct person using two identifiers.  Location: Patient: home Provider: office   I discussed the limitations, risks, security and privacy concerns of performing an evaluation and management service by telephone and the availability of in person appointments. I also discussed with the patient that there may be a patient responsible charge related to this service. The patient expressed understanding and agreed to proceed.     History of Present Illness: 2 weeks ago had some pnd. Pt saw pcp and got some prednisone to use if needed if her asthma flared. But did not use of prednisone until yesterday.  Then yesterday she got fever, chills, and worse nasal congestion. No has yellowish. She has cough now that is productive. Pt states she is very cautious. She wears masks, stays at home and sees only hand full of people.  She started prednisone due to concern that asthma would flare and she states often will get bronchitis.     Observations/Objective: General- no acute, general, pleasant.    Assessment and Plan:  Your have hx of asthma with recent fever and cough. Onset of symptoms past 24 hours.'  In light of current pandemic best to get you tested tomorrow. Order placed.  Also will go ahead and send in azithromycin antibiotic and benzonatate cough tab. This can treat bronchitis and has coverage for walking pneumonia.   Continue tapered prednisone and current inhalers.  Follow up in 7 days or as needed  25 minutes spent with pt. 50% of time spent counseling pt on plan going forward. Follow Up Instructions:    I discussed the assessment and treatment plan with the patient. The patient was provided an opportunity to ask  questions and all were answered. The patient agreed with the plan and demonstrated an understanding of the instructions.   The patient was advised to call back or seek an in-person evaluation if the symptoms worsen or if the condition fails to improve as anticipated.  I provided 25  minutes of non-face-to-face time during this encounter.   Mackie Pai, PA-C   Review of Systems  Constitutional: Positive for fever. Negative for chills and fatigue.  HENT: Positive for congestion. Negative for sinus pressure, sneezing and sore throat.   Respiratory: Positive for cough and wheezing. Negative for shortness of breath.   Cardiovascular: Negative for chest pain and palpitations.  Gastrointestinal: Negative for abdominal pain, constipation, diarrhea and nausea.  Musculoskeletal: Negative for back pain and myalgias.  Neurological: Negative for dizziness, light-headedness and headaches.  Hematological: Negative for adenopathy. Does not bruise/bleed easily.  Psychiatric/Behavioral: Negative for behavioral problems.    Past Medical History:  Diagnosis Date  . Anemia    pernicious  . Aortic ectasia (HCC)    infrarenal abdomina aorta 2.6 cm  . Asthma   . Colon cancer ([place]) 2012  . Diabetes mellitus    type 2  . Fatty liver 01/19/2015  . 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 History of pericarditis 07/26/2012  . Hyperlipidemia   . Pericarditis 07/26/2012  . Rheumatoid arthritis(714.0)   . Sjogren's syndrome ([place]) 03/17/2011  . Small bowel obstruction ([place]) 03/2014   ?due to adhesions from colon surgery per  pt  . Thyroid disease    hypothyroid  . Tobacco abuse      Social History   Socioeconomic History  . Marital status: Single    Spouse name: Not on file  . Number of children: 1  . Years of education: Not on file  . Highest education level: Not on file  Occupational History  . Occupation: unemployed    Employer: DISABLED  Social Needs  .  Financial resource strain: Not on file  . Food insecurity    Worry: Not on file    Inability: Not on file  . Transportation needs    Medical: Not on file    Non-medical: Not on file  Tobacco Use  . Smoking status: Current Every Day Smoker    Types: Cigarettes  . Smokeless tobacco: Never Used  . Tobacco comment: 3 cigarettes per day, Uses smokeless cigarette.  Substance and Sexual Activity  . Alcohol use: No    Alcohol/week: 1.0 standard drinks    Types: 1 Glasses of wine per week    Comment: Has not had alcohol since SBO 03/2014  . Drug use: Yes    Comment: Smokes "medical grade" marijuana 2 puffs 3 x weekly for rheumatoid arthritis.per pt  . Sexual activity: Never  Lifestyle  . Physical activity    Days per week: Not on file    Minutes per session: Not on file  . Stress: Not on file  Relationships  . Social Herbalist on phone: Not on file    Gets together: Not on file    Attends religious service: Not on file    Active member of club or organization: Not on file    Attends meetings of clubs or organizations: Not on file    Relationship status: Not on file  . Intimate partner violence    Fear of current or ex partner: Not on file    Emotionally abused: Not on file    Physically abused: Not on file    Forced sexual activity: Not on file  Other Topics Concern  . Not on file  Social History Narrative   Regular exercise:  Stretching exercises. Resistance bands   Caffeine: 1 mug (2cus) daily.       Past Surgical History:  Procedure Laterality Date  . CHOLECYSTECTOMY  10/23/2016  . COLON SURGERY  8/.23/13   tumor removed from sigmoid colon.  Marland Kitchen DILATION AND CURETTAGE OF UTERUS  1975  . HERNIA REPAIR  10/23/2016  . KNEE SURGERY  2006   arthroscopic left knee  . KNEE SURGERY  2001   right knee    Family History  Problem Relation Age of Onset  . Heart disease Other        CAD  . Diabetes Other   . Hyperlipidemia Other   . Stroke Other     Allergies   Allergen Reactions  . Levofloxacin Shortness Of Breath and Rash  . Mold Extract [Trichophyton] Anaphylaxis  . Ativan [Lorazepam]     Visual hallucinations.  Visual hallucinations.   . Cefuroxime Axetil     REACTION: asthma, cough  . Hydrocodone-Homatropine Nausea Only  . Rosuvastatin Calcium     Muscle pain, syncope  . Statins     Muscle pain, syncope  . Zetia [Ezetimibe] Diarrhea  . Hydrocodone-Acetaminophen Nausea Only    Current Outpatient Medications on File Prior to Visit  Medication Sig Dispense Refill  . albuterol (VENTOLIN HFA) 108 (90 Base) MCG/ACT inhaler Inhale 2 puffs  into the lungs every 6 (six) hours as needed for wheezing or shortness of breath. 54 g 1  . aspirin 81 MG tablet Take 81 mg by mouth daily.      . betamethasone dipropionate (DIPROLENE) 0.05 % cream Apply topically 2 (two) times daily. 30 g 1  . Cyanocobalamin (B-12) 1000 MCG CAPS Take 1,000 mcg by mouth daily.    Marland Kitchen estradiol (ESTRACE VAGINAL) 0.1 MG/GM vaginal cream 2g daily intravaginally for 1 week, then reduce to 1 gram PV daily for 1 week , followed by a maintenance dose of 1 g PV once weekly 42.5 g 5  . fluticasone (FLONASE) 50 MCG/ACT nasal spray Place 2 sprays into both nostrils daily as needed. 29.7 g 1  . levothyroxine (SYNTHROID, LEVOTHROID) 100 MCG tablet TAKE 1 TABLET DAILY 90 tablet 1  . montelukast (SINGULAIR) 10 MG tablet TAKE 1 TABLET AT BEDTIME 90 tablet 1  . multivitamin (THERAGRAN) per tablet Take 1 tablet by mouth daily.      . NON FORMULARY TUMERIC MILK.    . NON FORMULARY CBD. Derivative of marijuana without the marijuana.    . nystatin (MYCOSTATIN) 100000 UNIT/ML suspension TAKE 5 ML BY MOUTH 4 TIMES A DAY (Patient taking differently: TAKE 5 ML BY MOUTH 4 TIMES A DAY as needed) 180 mL 0  . nystatin (MYCOSTATIN/NYSTOP) powder Apply topically 4 (four) times daily. 30 g 1  . pantoprazole (PROTONIX) 40 MG tablet TAKE 1 TABLET DAILY 90 tablet 1  . predniSONE (DELTASONE) 10 MG tablet 4  tabs by mouth once daily for 2 days, then 3 tabs daily x 2 days, then 2 tabs daily x 2 days, then 1 tab daily x 2 days 20 tablet 0  . Probiotic Product (PROBIOTIC DAILY PO) Take 1 capsule by mouth daily.    . TRELEGY ELLIPTA 100-62.5-25 MCG/INH AEPB USE 1 INHALATION ORALLY    DAILY 180 each 3  . VOLTAREN 1 % GEL APPLY 2 GRAMS TOPICALLY 4 TIMES AS NEEDED 100 g 2   No current facility-administered medications on file prior to visit.     Temp 100.2 F (37.9 C) (Oral)   Ht 5' 2.4" (1.585 m)   Wt 182 lb (82.6 kg)   BMI 32.86 kg/m       Objective:   Physical Exam        Assessment & Plan:

## 2019-05-26 NOTE — Progress Notes (Signed)
Virtual Visit via Video Note  I connected with patient on 05/27/19 at  1:45 PM EDT by audio enabled telemedicine application and verified that I am speaking with the correct person using two identifiers.   THIS ENCOUNTER IS A VIRTUAL VISIT DUE TO COVID-19 - PATIENT WAS NOT SEEN IN THE OFFICE. PATIENT HAS CONSENTED TO VIRTUAL VISIT / TELEMEDICINE VISIT   Location of patient: home  Location of provider: office  I discussed the limitations of evaluation and management by telemedicine and the availability of in person appointments. The patient expressed understanding and agreed to proceed.   Subjective:   Sheryl [name] is a 73 y.o. female who presents for Medicare Annual (Subsequent) preventive examination.  Review of Systems:  Home Safety/Smoke Alarms: Feels safe in home. Smoke alarms in place.  Lives alone in 1 story home. Talks to friends or family daily.   Female:       Mammo- declines today       Dexa scan- 11/03/17       CCS- 01/02/15    Objective:     Vitals: Unable to assess. This visit is enabled though telemedicine due to Covid 19.   Advanced Directives 05/27/2019 05/20/2018 05/19/2017 09/20/2015  Does Patient Have a Medical Advance Directive? Yes Yes Yes Yes  Type of Paramedic of [place];Living will [place];Living will Living will Portal;Living will  Does patient want to make changes to medical advance directive? No - Patient declined - No - Patient declined -  Copy of [place] in Chart? No - copy requested No - copy requested - -    Tobacco Social History   Tobacco Use  Smoking Status Current Every Day Smoker  . Types: Cigarettes  Smokeless Tobacco Never Used  Tobacco Comment   3 cigarettes per day, Uses smokeless cigarette.     Ready to quit: Not Answered Counseling given: Not Answered Comment: 3 cigarettes per day, Uses smokeless cigarette.   Clinical Intake Pain  : No/denies pain    Past Medical History:  Diagnosis Date  . Anemia    pernicious  . Aortic ectasia (HCC)    infrarenal abdomina aorta 2.6 cm  . Asthma   . Colon cancer ([place]) 2012  . Diabetes mellitus    type 2  . Fatty liver 01/19/2015  . 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 History of pericarditis 07/26/2012  . Hyperlipidemia   . Pericarditis 07/26/2012  . Rheumatoid arthritis(714.0)   . Sjogren's syndrome ([place]) 03/17/2011  . Small bowel obstruction ([place]) 03/2014   ?due to adhesions from colon surgery per pt  . Thyroid disease    hypothyroid  . Tobacco abuse    Past Surgical History:  Procedure Laterality Date  . CHOLECYSTECTOMY  10/23/2016  . COLON SURGERY  8/.23/13   tumor removed from sigmoid colon.  Marland Kitchen DILATION AND CURETTAGE OF UTERUS  1975  . HERNIA REPAIR  10/23/2016  . KNEE SURGERY  2006   arthroscopic left knee  . KNEE SURGERY  2001   right knee   Family History  Problem Relation Age of Onset  . Heart disease Other        CAD  . Diabetes Other   . Hyperlipidemia Other   . Stroke Other    Social History   Socioeconomic History  . Marital status: Single    Spouse name: Not on file  . Number of children: 1  .  Years of education: Not on file  . Highest education level: Not on file  Occupational History  . Occupation: unemployed    Employer: DISABLED  Social Needs  . Financial resource strain: Not on file  . Food insecurity    Worry: Not on file    Inability: Not on file  . Transportation needs    Medical: Not on file    Non-medical: Not on file  Tobacco Use  . Smoking status: Current Every Day Smoker    Types: Cigarettes  . Smokeless tobacco: Never Used  . Tobacco comment: 3 cigarettes per day, Uses smokeless cigarette.  Substance and Sexual Activity  . Alcohol use: No    Alcohol/week: 1.0 standard drinks    Types: 1 Glasses of wine per week    Comment: Has not had alcohol since SBO 03/2014  . Drug use: Yes     Comment: Smokes "medical grade" marijuana 2 puffs 3 x weekly for rheumatoid arthritis.per pt  . Sexual activity: Never  Lifestyle  . Physical activity    Days per week: Not on file    Minutes per session: Not on file  . Stress: Not on file  Relationships  . Social Herbalist on phone: Not on file    Gets together: Not on file    Attends religious service: Not on file    Active member of club or organization: Not on file    Attends meetings of clubs or organizations: Not on file    Relationship status: Not on file  Other Topics Concern  . Not on file  Social History Narrative   Regular exercise:  Stretching exercises. Resistance bands   Caffeine: 1 mug (2cus) daily.       Outpatient Encounter Medications as of 05/27/2019  Medication Sig  . albuterol (VENTOLIN HFA) 108 (90 Base) MCG/ACT inhaler Inhale 2 puffs into the lungs every 6 (six) hours as needed for wheezing or shortness of breath.  Marland Kitchen aspirin 81 MG tablet Take 81 mg by mouth daily.    Marland Kitchen azithromycin (ZITHROMAX) 250 MG tablet Take 2 tablets by mouth on day 1, followed by 1 tablet by mouth daily for 4 days.  . benzonatate (TESSALON) 100 MG capsule Take 1 capsule (100 mg total) by mouth 3 (three) times daily as needed for cough.  . betamethasone dipropionate (DIPROLENE) 0.05 % cream Apply topically 2 (two) times daily.  . Cyanocobalamin (B-12) 1000 MCG CAPS Take 1,000 mcg by mouth daily.  Marland Kitchen estradiol (ESTRACE VAGINAL) 0.1 MG/GM vaginal cream 2g daily intravaginally for 1 week, then reduce to 1 gram PV daily for 1 week , followed by a maintenance dose of 1 g PV once weekly  . fluticasone (FLONASE) 50 MCG/ACT nasal spray Place 2 sprays into both nostrils daily as needed.  Marland Kitchen levothyroxine (SYNTHROID) 100 MCG tablet Take 1 tablet (100 mcg total) by mouth daily.  . montelukast (SINGULAIR) 10 MG tablet Take 1 tablet (10 mg total) by mouth at bedtime.  . multivitamin (THERAGRAN) per tablet Take 1 tablet by mouth daily.    .  NON FORMULARY TUMERIC MILK.  . NON FORMULARY CBD. Derivative of marijuana without the marijuana.  . nystatin (MYCOSTATIN) 100000 UNIT/ML suspension TAKE 5 ML BY MOUTH 4 TIMES A DAY (Patient taking differently: TAKE 5 ML BY MOUTH 4 TIMES A DAY as needed)  . nystatin (MYCOSTATIN/NYSTOP) powder Apply topically 4 (four) times daily.  . pantoprazole (PROTONIX) 40 MG tablet TAKE 1 TABLET DAILY  .  predniSONE (DELTASONE) 10 MG tablet 4 tabs by mouth once daily for 2 days, then 3 tabs daily x 2 days, then 2 tabs daily x 2 days, then 1 tab daily x 2 days  . Probiotic Product (PROBIOTIC DAILY PO) Take 1 capsule by mouth daily.  . TRELEGY ELLIPTA 100-62.5-25 MCG/INH AEPB USE 1 INHALATION ORALLY    DAILY  . VOLTAREN 1 % GEL APPLY 2 GRAMS TOPICALLY 4 TIMES AS NEEDED  . [DISCONTINUED] levothyroxine (SYNTHROID, LEVOTHROID) 100 MCG tablet TAKE 1 TABLET DAILY  . [DISCONTINUED] montelukast (SINGULAIR) 10 MG tablet TAKE 1 TABLET AT BEDTIME   No facility-administered encounter medications on file as of 05/27/2019.     Activities of Daily Living In your present state of health, do you have any difficulty performing the following activities: 05/27/2019  Hearing? N  Vision? N  Difficulty concentrating or making decisions? N  Walking or climbing stairs? N  Dressing or bathing? N  Doing errands, shopping? N  Preparing Food and eating ? N  Using the Toilet? N  In the past six months, have you accidently leaked urine? N  Do you have problems with loss of bowel control? N  Managing your Medications? N  Managing your Finances? N  Housekeeping or managing your Housekeeping? N  Some recent data might be hidden    Patient Care Team: [name], NP as PCP - General Kerin Salen, DDS as Consulting Physician ([place]) [place] as Consulting Physician (Ophthalmology) Lake Bells., MD as Consulting Physician (Gastroenterology) Brooks Sailors, MD as Consulting Physician  (Surgery) Zebedee Iba., MD as Consulting Physician (Hematology and Oncology)    Assessment:   This is a routine wellness examination for Cynthie. Physical assessment deferred to PCP.  Exercise Activities and Dietary recommendations Current Exercise Habits: Home exercise routine, Type of exercise: walking, Time (Minutes): 15, Frequency (Times/Week): 3, Weekly Exercise (Minutes/Week): 45, Intensity: Mild, Exercise limited by: None identified   Diet (meal preparation, eat out, water intake, caffeinated beverages, dairy products, fruits and vegetables): well balanced     Goals    . Exercise 3x per week (15 min per time)     Increase walking as tolerated. Continue chair exercises and increase as tolerated.       Fall Risk Fall Risk  05/27/2019 05/20/2018 07/30/2017 05/19/2017 03/03/2016  Falls in the past year? 0 No No No No  Comment - - Emmi Telephone Survey: data to providers prior to load - -    Depression Screen PHQ 2/9 Scores 05/27/2019 05/20/2018 05/19/2017 03/03/2016  PHQ - 2 Score 0 0 0 0     Cognitive Function Ad8 score reviewed for issues:  Issues making decisions:no  Less interest in hobbies / activities:no  Repeats questions, stories (family complaining):no  Trouble using ordinary gadgets (microwave, computer, phone):no  Forgets the month or year: no  Mismanaging finances: no  Remembering appts:no  Daily problems with thinking and/or memory:no Ad8 score is=0         Immunization History  Administered Date(s) Administered  . Fluad Quad(high Dose 65+) 04/29/2019  . Influenza Split 07/07/2011  . Influenza Whole 06/05/2009, 04/24/2010  . Influenza, High Dose Seasonal PF 06/29/2015, 04/30/2016, 05/19/2017, 05/24/2018  . Influenza,inj,Quad PF,6+ Mos 06/01/2013, 05/25/2014  . Pneumococcal Conjugate-13 06/01/2013  . Pneumococcal Polysaccharide-23 10/01/2010, 09/12/2016  . Td 08/25/2002  . Tdap 02/14/2013    Screening Tests Health Maintenance  Topic Date  Due  . URINE MICROALBUMIN  08/02/2016  . OPHTHALMOLOGY EXAM  12/23/2016  .  FOOT EXAM  05/25/2019  . HEMOGLOBIN A1C  10/27/2019  . MAMMOGRAM  11/04/2019  . TETANUS/TDAP  02/15/2023  . COLONOSCOPY  01/01/2025  . INFLUENZA VACCINE  Completed  . DEXA SCAN  Completed  . Hepatitis C Screening  Completed  . PNA vac Low Risk Adult  Completed      Plan:    Please schedule your next medicare wellness visit with me in 1 yr.  Continue to eat heart healthy diet (full of fruits, vegetables, whole grains, lean protein, water--limit salt, fat, and sugar intake) and increase physical activity as tolerated.  Continue doing brain stimulating activities (puzzles, reading, adult coloring books, staying active) to keep memory sharp.   Bring a copy of your living will and/or healthcare power of attorney to your next office visit.   I have personally reviewed and noted the following in the patient's chart:   . Medical and social history . Use of alcohol, tobacco or illicit drugs  . Current medications and supplements . Functional ability and status . Nutritional status . Physical activity . Advanced directives . List of other physicians . Hospitalizations, surgeries, and ER visits in previous 12 months . Vitals . Screenings to include cognitive, depression, and falls . Referrals and appointments  In addition, I have reviewed and discussed with patient certain preventive protocols, quality metrics, and best practice recommendations. A written personalized care plan for preventive services as well as general preventive health recommendations were provided to patient.     Shela Nevin, [place]  05/27/2019

## 2019-05-27 ENCOUNTER — Encounter: Payer: Self-pay | Admitting: *Deleted

## 2019-05-27 ENCOUNTER — Telehealth: Payer: Self-pay

## 2019-05-27 ENCOUNTER — Ambulatory Visit (INDEPENDENT_AMBULATORY_CARE_PROVIDER_SITE_OTHER): Payer: Medicare Other | Admitting: *Deleted

## 2019-05-27 ENCOUNTER — Other Ambulatory Visit: Payer: Self-pay

## 2019-05-27 ENCOUNTER — Telehealth: Payer: Self-pay | Admitting: *Deleted

## 2019-05-27 DIAGNOSIS — Z0000 Encounter for general adult medical examination without abnormal findings: Secondary | ICD-10-CM | POA: Diagnosis not present

## 2019-05-27 MED ORDER — LEVOTHYROXINE SODIUM 100 MCG PO TABS: 100.0000 ug | ORAL_TABLET | Freq: Every day | ORAL | 1 refills | Status: DC

## 2019-05-27 MED ORDER — MONTELUKAST SODIUM 10 MG PO TABS: 10.0000 mg | ORAL_TABLET | Freq: Every day | ORAL | 1 refills | Status: DC

## 2019-05-27 NOTE — Telephone Encounter (Signed)
Received faxes from [facility] requesting refills of levothyroxin and montelukast. Refills sent.

## 2019-05-27 NOTE — Patient Instructions (Signed)
Please schedule your next medicare wellness visit with me in 1 yr.  Continue to eat heart healthy diet (full of fruits, vegetables, whole grains, lean protein, water--limit salt, fat, and sugar intake) and increase physical activity as tolerated.  Continue doing brain stimulating activities (puzzles, reading, adult coloring books, staying active) to keep memory sharp.   Bring a copy of your living will and/or healthcare power of attorney to your next office visit.   Sheryl [name] , Thank you for taking time to come for your Medicare Wellness Visit. I appreciate your ongoing commitment to your health goals. Please review the following plan we discussed and let me know if I can assist you in the future.   These are the goals we discussed: Goals    . Exercise 3x per week (15 min per time)     Increase walking as tolerated. Continue chair exercises and increase as tolerated.       This is a list of the screening recommended for you and due dates:  Health Maintenance  Topic Date Due  . Urine Protein Check  08/02/2016  . Eye exam for diabetics  12/23/2016  . Complete foot exam   05/25/2019  . Hemoglobin A1C  10/27/2019  . Mammogram  11/04/2019  . Tetanus Vaccine  02/15/2023  . Colon Cancer Screening  01/01/2025  . Flu Shot  Completed  . DEXA scan (bone density measurement)  Completed  .  Hepatitis C: One time screening is recommended by Center for Disease Control  (CDC) for  adults born from [birth year] through 1964.   Completed  . Pneumonia vaccines  Completed    Health Maintenance After Age 12 After age 54, you are at a higher risk for certain long-term diseases and infections as well as injuries from falls. Falls are a major cause of broken bones and head injuries in people who are older than age 33. Getting regular preventive care can help to keep you healthy and well. Preventive care includes getting regular testing and making lifestyle changes as recommended by your health care  provider. Talk with your health care provider about:  Which screenings and tests you should have. A screening is a test that checks for a disease when you have no symptoms.  A diet and exercise plan that is right for you. What should I know about screenings and tests to prevent falls? Screening and testing are the best ways to find a health problem early. Early diagnosis and treatment give you the best chance of managing medical conditions that are common after age 24. Certain conditions and lifestyle choices may make you more likely to have a fall. Your health care provider may recommend:  Regular vision checks. Poor vision and conditions such as cataracts can make you more likely to have a fall. If you wear glasses, make sure to get your prescription updated if your vision changes.  Medicine review. Work with your health care provider to regularly review all of the medicines you are taking, including over-the-counter medicines. Ask your health care provider about any side effects that may make you more likely to have a fall. Tell your health care provider if any medicines that you take make you feel dizzy or sleepy.  Osteoporosis screening. Osteoporosis is a condition that causes the bones to get weaker. This can make the bones weak and cause them to break more easily.  Blood pressure screening. Blood pressure changes and medicines to control blood pressure can make you feel dizzy.  Strength  and balance checks. Your health care provider may recommend certain tests to check your strength and balance while standing, walking, or changing positions.  Foot health exam. Foot pain and numbness, as well as not wearing proper footwear, can make you more likely to have a fall.  Depression screening. You may be more likely to have a fall if you have a fear of falling, feel emotionally low, or feel unable to do activities that you used to do.  Alcohol use screening. Using too much alcohol can affect your  balance and may make you more likely to have a fall. What actions can I take to lower my risk of falls? General instructions  Talk with your health care provider about your risks for falling. Tell your health care provider if: ? You fall. Be sure to tell your health care provider about all falls, even ones that seem minor. ? You feel dizzy, sleepy, or off-balance.  Take over-the-counter and prescription medicines only as told by your health care provider. These include any supplements.  Eat a healthy diet and maintain a healthy weight. A healthy diet includes low-fat dairy products, low-fat (lean) meats, and fiber from whole grains, beans, and lots of fruits and vegetables. Home safety  Remove any tripping hazards, such as rugs, cords, and clutter.  Install safety equipment such as grab bars in bathrooms and safety rails on stairs.  Keep rooms and walkways well-lit. Activity   Follow a regular exercise program to stay fit. This will help you maintain your balance. Ask your health care provider what types of exercise are appropriate for you.  If you need a cane or walker, use it as recommended by your health care provider.  Wear supportive shoes that have nonskid soles. Lifestyle  Do not drink alcohol if your health care provider tells you not to drink.  If you drink alcohol, limit how much you have: ? 0-1 drink a day for women. ? 0-2 drinks a day for men.  Be aware of how much alcohol is in your drink. In the [place] one drink equals one typical bottle of beer (12 oz), one-half glass of wine (5 oz), or one shot of hard liquor (1 oz).  Do not use any products that contain nicotine or tobacco, such as cigarettes and e-cigarettes. If you need help quitting, ask your health care provider. Summary  Having a healthy lifestyle and getting preventive care can help to protect your health and wellness after age 41.  Screening and testing are the best way to find a health problem early  and help you avoid having a fall. Early diagnosis and treatment give you the best chance for managing medical conditions that are more common for people who are older than age 80.  Falls are a major cause of broken bones and head injuries in people who are older than age 68. Take precautions to prevent a fall at home.  Work with your health care provider to learn what changes you can make to improve your health and wellness and to prevent falls. This information is not intended to replace advice given to you by your health care provider. Make sure you discuss any questions you have with your health care provider. Document Released: 06/24/2017 Document Revised: 12/02/2018 Document Reviewed: 06/24/2017 Elsevier Patient Education  2020 [employer].

## 2019-05-27 NOTE — Telephone Encounter (Signed)
Copied from [place] [id number]. Topic: General - Other >> May 27, 2019  1:41 PM Selinda Flavin B, NT wrote: Reason for CRM: Patient calling and states that she was recently seen by Percell Miller. States that he came her antibiotic and prednisone and wanted her to go get tested for COVID. States that she has not yet been tested. States that she feels better and her fever has gone away. States that she prefers to stay home alone and rest and see if the antibiotics and prednisone continue to work. States that if she does get a fever again or any other symptoms, that she will go get tested. Ecouraged to go get tested, but patient does not want to feel pressured to get tested or accidentally come in contact at the testing site with someone positive. Please advise.

## 2019-05-27 NOTE — Telephone Encounter (Signed)
Noted. No further recommendations at this time since pt is feeling better and is quarantining at home.

## 2019-05-30 NOTE — Telephone Encounter (Signed)
Patient reports she has continued to feel better, advised no further recommendations at this time.

## 2019-07-29 ENCOUNTER — Ambulatory Visit: Payer: Medicare Other | Admitting: Family

## 2019-08-08 ENCOUNTER — Other Ambulatory Visit: Payer: Self-pay

## 2019-08-08 ENCOUNTER — Ambulatory Visit (INDEPENDENT_AMBULATORY_CARE_PROVIDER_SITE_OTHER): Payer: Medicare Other | Admitting: Family

## 2019-08-08 ENCOUNTER — Encounter: Payer: Self-pay | Admitting: Family

## 2019-08-08 VITALS — BP 131/75 | HR 76

## 2019-08-08 DIAGNOSIS — E119 Type 2 diabetes mellitus without complications: Secondary | ICD-10-CM | POA: Diagnosis not present

## 2019-08-08 DIAGNOSIS — J4520 Mild intermittent asthma, uncomplicated: Secondary | ICD-10-CM | POA: Diagnosis not present

## 2019-08-08 DIAGNOSIS — E785 Hyperlipidemia, unspecified: Secondary | ICD-10-CM | POA: Diagnosis not present

## 2019-08-08 DIAGNOSIS — E039 Hypothyroidism, unspecified: Secondary | ICD-10-CM | POA: Diagnosis not present

## 2019-08-08 NOTE — Progress Notes (Signed)
Virtual Visit via Video Note  I connected with Sheryl [name] on 08/08/19 at 11:20 AM EST by a video enabled telemedicine application and verified that I am speaking with the correct person using two identifiers.  Location: Patient: home Provider: home   I discussed the limitations of evaluation and management by telemedicine and the availability of in person appointments. The patient expressed understanding and agreed to proceed.  History of Present Illness:  Patient is a 73 yr old female who presents today for follow up. She has been strictly staying home and social distancing due to COVID-19.   DM2-  Lab Results  Component Value Date   HGBA1C 6.1 (H) 04/29/2019   HGBA1C 6.4 05/24/2018   HGBA1C 6.5 12/04/2017   Lab Results  Component Value Date   MICROALBUR 5.5 (H) 08/03/2015   LDLCALC 123 (H) 12/19/2016   CREATININE 0.89 04/29/2019   Hyperlipidemia- not on statin.  Lab Results  Component Value Date   CHOL 186 12/04/2017   HDL 30.70 (L) 12/04/2017   LDLCALC 123 (H) 12/19/2016   LDLDIRECT 129.0 12/04/2017   TRIG 202.0 (H) 12/04/2017   CHOLHDL 6 12/04/2017   Hypothyroid- reports feeling well on synthroid. Lab Results  Component Value Date   TSH 0.95 04/29/2019   Asthma- reports that her asthma has been stable. Except occasional flare up from outdoors.  She continue trelegy,  Singulair, albuterol prn.        Past Medical History:  Diagnosis Date  . Anemia    pernicious  . Aortic ectasia (HCC)    infrarenal abdomina aorta 2.6 cm  . Asthma   . Colon cancer ([place]) 2012  . Diabetes mellitus    type 2  . Fatty liver 01/19/2015  . 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 History of pericarditis 07/26/2012  . Hyperlipidemia   . Pericarditis 07/26/2012  . Rheumatoid arthritis(714.0)   . Sjogren's syndrome ([place]) 03/17/2011  . Small bowel obstruction ([place]) 03/2014   ?due to adhesions from colon surgery per pt  . Thyroid disease     hypothyroid  . Tobacco abuse      Social History   Socioeconomic History  . Marital status: Single    Spouse name: Not on file  . Number of children: 1  . Years of education: Not on file  . Highest education level: Not on file  Occupational History  . Occupation: unemployed    Employer: DISABLED  Tobacco Use  . Smoking status: Current Every Day Smoker    Types: Cigarettes  . Smokeless tobacco: Never Used  . Tobacco comment: 3 cigarettes per day, Uses smokeless cigarette.  Substance and Sexual Activity  . Alcohol use: No    Alcohol/week: 1.0 standard drinks    Types: 1 Glasses of wine per week    Comment: Has not had alcohol since SBO 03/2014  . Drug use: Yes    Comment: Smokes "medical grade" marijuana 2 puffs 3 x weekly for rheumatoid arthritis.per pt  . Sexual activity: Never  Other Topics Concern  . Not on file  Social History Narrative   Regular exercise:  Stretching exercises. Resistance bands   Caffeine: 1 mug (2cus) daily.      Social Determinants of Health   Financial Resource Strain:   . Difficulty of Paying Living Expenses: Not on file  Food Insecurity:   . Worried About Charity fundraiser in the Last Year: Not on file  . Ran Out of Food in  the Last Year: Not on file  Transportation Needs:   . Lack of Transportation (Medical): Not on file  . Lack of Transportation (Non-Medical): Not on file  Physical Activity:   . Days of Exercise per Week: Not on file  . Minutes of Exercise per Session: Not on file  Stress:   . Feeling of Stress : Not on file  Social Connections:   . Frequency of Communication with Friends and Family: Not on file  . Frequency of Social Gatherings with Friends and Family: Not on file  . Attends Religious Services: Not on file  . Active Member of Clubs or Organizations: Not on file  . Attends Archivist Meetings: Not on file  . Marital Status: Not on file  Intimate Partner Violence:   . Fear of Current or Ex-Partner: Not on  file  . Emotionally Abused: Not on file  . Physically Abused: Not on file  . Sexually Abused: Not on file    Past Surgical History:  Procedure Laterality Date  . CHOLECYSTECTOMY  10/23/2016  . COLON SURGERY  8/.23/13   tumor removed from sigmoid colon.  Marland Kitchen DILATION AND CURETTAGE OF UTERUS  1975  . HERNIA REPAIR  10/23/2016  . KNEE SURGERY  2006   arthroscopic left knee  . KNEE SURGERY  2001   right knee    Family History  Problem Relation Age of Onset  . Heart disease Other        CAD  . Diabetes Other   . Hyperlipidemia Other   . Stroke Other     Allergies  Allergen Reactions  . Levofloxacin Shortness Of Breath and Rash  . Mold Extract [Trichophyton] Anaphylaxis  . Ativan [Lorazepam]     Visual hallucinations.  Visual hallucinations.   . Cefuroxime Axetil     REACTION: asthma, cough  . Hydrocodone-Homatropine Nausea Only  . Rosuvastatin Calcium     Muscle pain, syncope  . Statins     Muscle pain, syncope  . Zetia [Ezetimibe] Diarrhea  . Hydrocodone-Acetaminophen Nausea Only    Current Outpatient Medications on File Prior to Visit  Medication Sig Dispense Refill  . albuterol (VENTOLIN HFA) 108 (90 Base) MCG/ACT inhaler Inhale 2 puffs into the lungs every 6 (six) hours as needed for wheezing or shortness of breath. 54 g 1  . aspirin 81 MG tablet Take 81 mg by mouth daily.      Marland Kitchen azithromycin (ZITHROMAX) 250 MG tablet Take 2 tablets by mouth on day 1, followed by 1 tablet by mouth daily for 4 days. 6 tablet 0  . benzonatate (TESSALON) 100 MG capsule Take 1 capsule (100 mg total) by mouth 3 (three) times daily as needed for cough. 30 capsule 0  . betamethasone dipropionate (DIPROLENE) 0.05 % cream Apply topically 2 (two) times daily. 30 g 1  . Cyanocobalamin (B-12) 1000 MCG CAPS Take 1,000 mcg by mouth daily.    Marland Kitchen estradiol (ESTRACE VAGINAL) 0.1 MG/GM vaginal cream 2g daily intravaginally for 1 week, then reduce to 1 gram PV daily for 1 week , followed by a  maintenance dose of 1 g PV once weekly 42.5 g 5  . fluticasone (FLONASE) 50 MCG/ACT nasal spray Place 2 sprays into both nostrils daily as needed. 29.7 g 1  . levothyroxine (SYNTHROID) 100 MCG tablet Take 1 tablet (100 mcg total) by mouth daily. 90 tablet 1  . montelukast (SINGULAIR) 10 MG tablet Take 1 tablet (10 mg total) by mouth at bedtime. 90 tablet 1  .  multivitamin (THERAGRAN) per tablet Take 1 tablet by mouth daily.      . NON FORMULARY TUMERIC MILK.    . NON FORMULARY CBD. Derivative of marijuana without the marijuana.    . nystatin (MYCOSTATIN) 100000 UNIT/ML suspension TAKE 5 ML BY MOUTH 4 TIMES A DAY (Patient taking differently: TAKE 5 ML BY MOUTH 4 TIMES A DAY as needed) 180 mL 0  . nystatin (MYCOSTATIN/NYSTOP) powder Apply topically 4 (four) times daily. 30 g 1  . pantoprazole (PROTONIX) 40 MG tablet TAKE 1 TABLET DAILY 90 tablet 1  . predniSONE (DELTASONE) 10 MG tablet 4 tabs by mouth once daily for 2 days, then 3 tabs daily x 2 days, then 2 tabs daily x 2 days, then 1 tab daily x 2 days 20 tablet 0  . Probiotic Product (PROBIOTIC DAILY PO) Take 1 capsule by mouth daily.    . TRELEGY ELLIPTA 100-62.5-25 MCG/INH AEPB USE 1 INHALATION ORALLY    DAILY 180 each 3  . VOLTAREN 1 % GEL APPLY 2 GRAMS TOPICALLY 4 TIMES AS NEEDED 100 g 2   No current facility-administered medications on file prior to visit.    BP 131/75 (BP Location: Right Wrist, Patient Position: Sitting)   Pulse 76   SpO2 99%    Observations/Objective:   Gen: Awake, alert, no acute distress Resp: Breathing is even and non-labored Psych: calm/pleasant demeanor Neuro: Alert and Oriented x 3, + facial symmetry, speech is clear.   Assessment and Plan:  DM2- clinically stable. Continue diabetic diet.  Asthma- stable on current regimen. Continue same.  Hypothyroid- clinically stable on synthroid, continue same.   Hyperlipidemia- plan to repeat lipid panel next visit. She is unable to tolerate  statins.   Follow Up Instructions:    I discussed the assessment and treatment plan with the patient. The patient was provided an opportunity to ask questions and all were answered. The patient agreed with the plan and demonstrated an understanding of the instructions.   The patient was advised to call back or seek an in-person evaluation if the symptoms worsen or if the condition fails to improve as anticipated.  [name], NP

## 2019-08-11 ENCOUNTER — Other Ambulatory Visit: Payer: Self-pay | Admitting: Family

## 2019-08-11 NOTE — Telephone Encounter (Signed)
Last OV 08/08/19 Last refill 12/08/18 #90/1 Next OV not scheduled

## 2019-08-31 ENCOUNTER — Telehealth: Payer: Self-pay | Admitting: *Deleted

## 2019-08-31 MED ORDER — MONTELUKAST SODIUM 10 MG PO TABS: 10.0000 mg | ORAL_TABLET | Freq: Every day | ORAL | 1 refills | Status: DC

## 2019-08-31 MED ORDER — LEVOTHYROXINE SODIUM 100 MCG PO TABS: 100.0000 ug | ORAL_TABLET | Freq: Every day | ORAL | 1 refills | Status: DC

## 2019-08-31 NOTE — Telephone Encounter (Addendum)
Received faxes from [facility] requesting refills of: Singulair and Levothyroxine. Refills sent to place on hold for next refill. Left detailed message on voicemail to call and schedule Virtual follow up on or soon after March 14.

## 2019-10-19 ENCOUNTER — Other Ambulatory Visit: Payer: Self-pay

## 2019-10-19 ENCOUNTER — Ambulatory Visit (INDEPENDENT_AMBULATORY_CARE_PROVIDER_SITE_OTHER): Payer: Medicare Other | Admitting: Medical

## 2019-10-19 VITALS — BP 152/91 | HR 82 | Temp 97.3°F | Wt 185.2 lb

## 2019-10-19 DIAGNOSIS — R8290 Unspecified abnormal findings in urine: Secondary | ICD-10-CM | POA: Diagnosis not present

## 2019-10-19 DIAGNOSIS — J320 Chronic maxillary sinusitis: Secondary | ICD-10-CM | POA: Diagnosis not present

## 2019-10-19 DIAGNOSIS — R05 Cough: Secondary | ICD-10-CM

## 2019-10-19 DIAGNOSIS — J4520 Mild intermittent asthma, uncomplicated: Secondary | ICD-10-CM | POA: Diagnosis not present

## 2019-10-19 DIAGNOSIS — R059 Cough, unspecified: Secondary | ICD-10-CM

## 2019-10-19 MED ORDER — AMOXICILLIN-POT CLAVULANATE 875-125 MG PO TABS: 1.0000 | ORAL_TABLET | Freq: Two times a day (BID) | ORAL | 0 refills | Status: DC

## 2019-10-19 MED ORDER — BENZONATATE 100 MG PO CAPS: 100.0000 mg | ORAL_CAPSULE | Freq: Three times a day (TID) | ORAL | 0 refills | Status: DC | PRN

## 2019-10-19 MED ORDER — PREDNISONE 10 MG (21) PO TBPK: ORAL_TABLET | ORAL | 0 refills | Status: DC

## 2019-10-19 NOTE — Progress Notes (Signed)
   Subjective:    Patient ID: Kathee Polite, female    DOB: 23-Apr-1946, 74 y.o.   MRN: [id number]  HPI  Virtual Visit via Video Note  I connected with NACOLE FULMORE on 10/19/19 at  2:40 PM EST by a video enabled telemedicine application and verified that I am speaking with the correct person using two identifiers.  Location: Patient: home Provider: office   I discussed the limitations of evaluation and management by telemedicine and the availability of in person appointments. The patient expressed understanding and agreed to proceed.  History of Present Illness:   Pt in for sinus pressure. Hx of chronic sinus infection. Recently thick yellow/white mucus. She has pnd.   Her symptoms worsening over past 3 week.  She states on and off sinus infection, bronchitis and asthma hx.  Over the counter  sinus aid used  but not clearing up her symptoms.   Some intermittent cough over past week. No fever,no chills, no sweats and normal smell.  Sunday night transient chill and slight body ache(has RA). She had some fatigue on Sunday as well. But those body aches have resolved.  Yesterday felt better compared to Sunday. But still tired today.  I provided 30  minutes of non-face-to-face time during this encounter.  Pt gets everything delivered. But nephew does visit her. They keep masks on and they talk on porch.    Observations/Objective:  General-no acute distress, pleasant, oriented. Lungs- on inspection lungs appear unlabored. Neck- no tracheal deviation or jvd on inspection. Neuro- gross motor function appears intact. Heent- maxillary sinus pressure.   Assessment and Plan: Start  augmentin, benzonatate and use albuterol  inhaler if needed.  Will see if any urinary symptoms develop. Then would need culture. Based on odor to urine some concern for possible uti.  If wheezing occurs notify us. Start taper predisone. 6 day taper dose.  Please call pharmacy to set up  delivery of meds.  Update [place] if you worsen as in the event would get covid testing.  Follow up 7 days or as needed  30 minutes spent with pt.   Follow Up Instructions:    I discussed the assessment and treatment plan with the patient. The patient was provided an opportunity to ask questions and all were answered. The patient agreed with the plan and demonstrated an understanding of the instructions.   The patient was advised to call back or seek an in-person evaluation if the symptoms worsen or if the condition fails to improve as anticipated.     Mackie Pai, PA-C   Review of Systems  Genitourinary: Negative for dysuria, urgency, vaginal discharge and vaginal pain.       Dark yellow urine. Mild odor to urine. Usually she can't smell her urine.       Objective:   Physical Exam        Assessment & Plan:  Start tx augmentin, benzonatate and [place] inhaler if needed.  Will see if any urinary symptoms develop. Then would need culture.  If wheezing occurs notify us. Will send taper predisone. 6 day taper dose.  Please call pharmacy to set up delivery of meds.  Update [place] if you worsen as in the event get covid testing   30 minutes spent with pt. 50% of time

## 2019-10-19 NOTE — Patient Instructions (Addendum)
Start  augmentin, benzonatate and use albuterol  inhaler if needed.  Will see if any urinary symptoms develop. Then would need culture. Based on odor to urine some concern for possible uti.  If wheezing occurs notify us. Start taper predisone. 6 day taper dose.  Please call pharmacy to set up delivery of meds.  Update [place] if you worsen as in the event would get covid testing.  Follow up 7 days or as needed

## 2019-10-24 ENCOUNTER — Telehealth: Payer: Self-pay

## 2019-10-24 ENCOUNTER — Telehealth: Payer: Self-pay | Admitting: Family

## 2019-10-24 NOTE — Telephone Encounter (Signed)
If better then sounds like covid testing not necessary.

## 2019-10-24 NOTE — Telephone Encounter (Signed)
Pt states that she had a virtual visit last week and patient states that she is feeling much better. Patient body temp is normalized as well. Provider requested that patient call back this information to rule out Covid testing.

## 2019-10-25 NOTE — Telephone Encounter (Signed)
Patient is aware she doesn't need to have a Covid test , states she is just having sinus pressure but overall okay

## 2019-10-25 NOTE — Telephone Encounter (Signed)
Error

## 2019-11-01 ENCOUNTER — Ambulatory Visit (INDEPENDENT_AMBULATORY_CARE_PROVIDER_SITE_OTHER): Payer: Medicare Other | Admitting: Family

## 2019-11-01 ENCOUNTER — Other Ambulatory Visit: Payer: Self-pay

## 2019-11-01 ENCOUNTER — Encounter: Payer: Self-pay | Admitting: Family

## 2019-11-01 VITALS — BP 124/80 | HR 97 | Temp 97.1°F | Wt 182.0 lb

## 2019-11-01 DIAGNOSIS — E119 Type 2 diabetes mellitus without complications: Secondary | ICD-10-CM | POA: Diagnosis not present

## 2019-11-01 DIAGNOSIS — E039 Hypothyroidism, unspecified: Secondary | ICD-10-CM

## 2019-11-01 DIAGNOSIS — N952 Postmenopausal atrophic vaginitis: Secondary | ICD-10-CM

## 2019-11-01 DIAGNOSIS — J329 Chronic sinusitis, unspecified: Secondary | ICD-10-CM

## 2019-11-01 DIAGNOSIS — J4520 Mild intermittent asthma, uncomplicated: Secondary | ICD-10-CM | POA: Diagnosis not present

## 2019-11-01 DIAGNOSIS — M069 Rheumatoid arthritis, unspecified: Secondary | ICD-10-CM

## 2019-11-01 MED ORDER — BETAMETHASONE DIPROPIONATE 0.05 % EX CREA: TOPICAL_CREAM | Freq: Two times a day (BID) | CUTANEOUS | 1 refills | Status: DC

## 2019-11-01 NOTE — Progress Notes (Signed)
Virtual Visit via Video Note  I connected with Sheryl [name] on 11/01/19 at  1:00 PM EST by a video enabled telemedicine application and verified that I am speaking with the correct person using two identifiers.  Location: Patient: home Provider: work   I discussed the limitations of evaluation and management by telemedicine and the availability of in person appointments. The patient expressed understanding and agreed to proceed.  History of Present Illness:  Hypothyroid-  Lab Results  Component Value Date   TSH 0.95 04/29/2019   Reports that she has one day left of augmentin.  Feeling much better.   Has a scratchy throat which she attributes to allergy.      Observations/Objective:   Gen: Awake, alert, no acute distress Resp: Breathing is even and non-labored Psych: calm/pleasant demeanor Neuro: Alert and Oriented x 3, + facial symmetry, speech is clear.   Assessment and Plan:  Diabetes Type 2- check A1C  Sinusitis- reports improvement in her symptoms.  Discussed ENT referral, she declines at this time.  Hypothyroid- reports that she has some fatigue.  Not sure if it is due to RA or thyroid. Continues synthroid. Will obtain follow up tsh.  RA- reports no current joint pain, just fatigue. She has worked on a better diet, tumeric and medical grade marijuana which she feels is keeping her inflammation controlled.   BP Readings from Last 3 Encounters:  11/01/19 124/80  10/19/19 (!) 152/91  08/08/19 131/75   Asthma- reports that asthma symptoms come and go, depends on the environment outside.   Vaginal atrophy- using estrace 1-2 x a week and prn betamethasone to vulva.   Asthma- overall controlled. Continue Trelegy and synthroid. I encouraged her to get her covid-19 vaccine.   Follow Up Instructions:    I discussed the assessment and treatment plan with the patient. The patient was provided an opportunity to ask questions and all were answered. The patient  agreed with the plan and demonstrated an understanding of the instructions.   The patient was advised to call back or seek an in-person evaluation if the symptoms worsen or if the condition fails to improve as anticipated.  [name], NP

## 2019-11-02 DIAGNOSIS — Z23 Encounter for immunization: Secondary | ICD-10-CM | POA: Diagnosis not present

## 2019-11-09 ENCOUNTER — Other Ambulatory Visit: Payer: Self-pay

## 2019-11-10 ENCOUNTER — Other Ambulatory Visit: Payer: Self-pay

## 2019-11-10 ENCOUNTER — Other Ambulatory Visit (INDEPENDENT_AMBULATORY_CARE_PROVIDER_SITE_OTHER): Payer: Medicare Other

## 2019-11-10 DIAGNOSIS — E039 Hypothyroidism, unspecified: Secondary | ICD-10-CM

## 2019-11-10 DIAGNOSIS — E119 Type 2 diabetes mellitus without complications: Secondary | ICD-10-CM

## 2019-11-10 LAB — TSH: TSH: 1.23 u[IU]/mL (ref 0.35–4.50)

## 2019-11-10 LAB — COMPREHENSIVE METABOLIC PANEL
ALT: 53 U/L — ABNORMAL HIGH (ref 0–35)
AST: 63 U/L — ABNORMAL HIGH (ref 0–37)
Albumin: 3.6 g/dL (ref 3.5–5.2)
Alkaline Phosphatase: 91 U/L (ref 39–117)
BUN: 18 mg/dL (ref 6–23)
CO2: 28 mEq/L (ref 19–32)
Calcium: 9.1 mg/dL (ref 8.4–10.5)
Chloride: 102 mEq/L (ref 96–112)
Creatinine, Ser: 0.76 mg/dL (ref 0.40–1.20)
GFR: 74.72 mL/min (ref >60.00)
Glucose, Bld: 121 mg/dL — ABNORMAL HIGH (ref 70–99)
Potassium: 4.3 mEq/L (ref 3.5–5.1)
Sodium: 135 mEq/L (ref 135–145)
Total Bilirubin: 0.6 mg/dL (ref 0.2–1.2)
Total Protein: 7.8 g/dL (ref 6.0–8.3)

## 2019-11-10 LAB — HEMOGLOBIN A1C: Hgb A1c MFr Bld: 6.3 % (ref 4.6–6.5)

## 2019-11-14 ENCOUNTER — Encounter: Payer: Self-pay | Admitting: Family

## 2019-11-16 NOTE — Progress Notes (Signed)
Mailed out to pt 

## 2019-11-30 DIAGNOSIS — Z23 Encounter for immunization: Secondary | ICD-10-CM | POA: Diagnosis not present

## 2019-12-06 ENCOUNTER — Telehealth: Payer: Self-pay | Admitting: Pulmonary Disease

## 2019-12-06 MED ORDER — TRELEGY ELLIPTA 100-62.5-25 MCG/INH IN AEPB: 1.0000 | INHALATION_SPRAY | Freq: Every day | RESPIRATORY_TRACT | 0 refills | Status: DC

## 2019-12-06 NOTE — Telephone Encounter (Signed)
RX has been sent for patient. She has been scheduled for a video visit on 01/10/20 at 1045am.

## 2019-12-06 NOTE — Telephone Encounter (Signed)
Okay to send 1 prescription. Please make an appointment with APP

## 2019-12-06 NOTE — Telephone Encounter (Signed)
Spoke with pt, she is requesting a refill on Trelegy. I advised her that she hasn't been seen since 2019. She reported she is doing really well and thought staying away was safer for her. She will make an appointment. Can we send one inhaler to [facility] and then send a 3 month supply to [facility]. RA please advise.   ID [id number]

## 2019-12-07 DIAGNOSIS — C186 Malignant neoplasm of descending colon: Secondary | ICD-10-CM | POA: Diagnosis not present

## 2019-12-07 DIAGNOSIS — C189 Malignant neoplasm of colon, unspecified: Secondary | ICD-10-CM | POA: Diagnosis not present

## 2019-12-20 ENCOUNTER — Other Ambulatory Visit (HOSPITAL_BASED_OUTPATIENT_CLINIC_OR_DEPARTMENT_OTHER): Payer: Self-pay | Admitting: Family

## 2019-12-20 DIAGNOSIS — Z1231 Encounter for screening mammogram for malignant neoplasm of breast: Secondary | ICD-10-CM

## 2019-12-29 DIAGNOSIS — I2510 Atherosclerotic heart disease of native coronary artery without angina pectoris: Secondary | ICD-10-CM | POA: Diagnosis not present

## 2019-12-29 DIAGNOSIS — Z85038 Personal history of other malignant neoplasm of large intestine: Secondary | ICD-10-CM | POA: Diagnosis not present

## 2019-12-29 DIAGNOSIS — K7460 Unspecified cirrhosis of liver: Secondary | ICD-10-CM | POA: Diagnosis not present

## 2019-12-29 DIAGNOSIS — Q613 Polycystic kidney, unspecified: Secondary | ICD-10-CM | POA: Diagnosis not present

## 2019-12-29 DIAGNOSIS — Z9049 Acquired absence of other specified parts of digestive tract: Secondary | ICD-10-CM | POA: Diagnosis not present

## 2019-12-29 DIAGNOSIS — J439 Emphysema, unspecified: Secondary | ICD-10-CM | POA: Diagnosis not present

## 2019-12-29 DIAGNOSIS — C189 Malignant neoplasm of colon, unspecified: Secondary | ICD-10-CM | POA: Diagnosis not present

## 2019-12-29 DIAGNOSIS — D3502 Benign neoplasm of left adrenal gland: Secondary | ICD-10-CM | POA: Diagnosis not present

## 2019-12-29 DIAGNOSIS — I700 Atherosclerosis of aorta: Secondary | ICD-10-CM | POA: Diagnosis not present

## 2019-12-29 DIAGNOSIS — K449 Diaphragmatic hernia without obstruction or gangrene: Secondary | ICD-10-CM | POA: Diagnosis not present

## 2019-12-29 DIAGNOSIS — Z08 Encounter for follow-up examination after completed treatment for malignant neoplasm: Secondary | ICD-10-CM | POA: Diagnosis not present

## 2020-01-10 ENCOUNTER — Encounter: Payer: Self-pay | Admitting: Pulmonary Disease

## 2020-01-10 ENCOUNTER — Telehealth (INDEPENDENT_AMBULATORY_CARE_PROVIDER_SITE_OTHER): Payer: Medicare Other | Admitting: Pulmonary Disease

## 2020-01-10 DIAGNOSIS — Z87891 Personal history of nicotine dependence: Secondary | ICD-10-CM

## 2020-01-10 DIAGNOSIS — J432 Centrilobular emphysema: Secondary | ICD-10-CM | POA: Diagnosis not present

## 2020-01-10 NOTE — Patient Instructions (Signed)
Refills on trelegy Schedule spiro -pre/post next visit in september

## 2020-01-10 NOTE — Progress Notes (Signed)
I connected with  Sheryl [name] on 01/10/20 by a video enabled telemedicine application and verified that I am speaking with the correct person using two identifiers.   I discussed the limitations of evaluation and management by telemedicine. The patient expressed understanding and agreed to proceed.  74 year old smoker for follow-up of COPD/asthma  PMH -  rheumatoid arthritis since age 29, needed steroids for a short time, could not tolerate Biologics. This has caused deformity of her hands and also affected her knees and ankles. - colon cancer status post colectomy in 2013 and undergoes surveillance CT scans.   Has received Covid vaccines.  Has been mostly indoors during the pandemic. Trelegy is worked well for her.  She reports increased albuterol use and attributes this to pollen and allergy season.  No nocturnal symptoms No frequent exacerbations  We reviewed her CT chest/abdomen/pelvis from care everywhere She reports occasional smoking 1 to 2 cigarettes a month, years has never left her  Significant tests/ events reviewed CT chest/abdomen/pelvis 12/2019 >> reported as mild emphysema, 10 mm prevascular mediastinal lymph node stable  CT chest/abdomen/pelvis  11/2015 >> showed mild-to-moderate apical emphysema 2 mm right upper lobe nodule stable since 06/2012 and stable calcified granuloma in the posterior right upper lobe adrenal adenoma was also noted probably benign.  Spirometry 01/2017 >> moderate airway obstruction with a ratio of 54, FEV1 of 50% and FVC of 70%. Post bronchodilator testing was not performed    ROS Patient denies significant dyspnea,cough, hemoptysis,  chest pain, palpitations, pedal edema, orthopnea, paroxysmal nocturnal dyspnea, lightheadedness, nausea, vomiting, abdominal or  leg pains     Exam not possible since we do visit Oxygen saturation normal   Total encounter time was 21 minutes

## 2020-01-11 MED FILL — CLINDAMYCIN HCL 150 MG CAPS: 150 | 3 days supply | Qty: 6 | Fill #0

## 2020-01-11 NOTE — Assessment & Plan Note (Signed)
Refills on trelegy Schedule spiro -pre/post next visit in September -to see if she has any reversibility   Continue to use albuterol as needed

## 2020-01-11 NOTE — Assessment & Plan Note (Signed)
Complete tobacco cessation again emphasized CT chest was reviewed Mediastinal lymph node is stable

## 2020-04-12 ENCOUNTER — Ambulatory Visit (HOSPITAL_BASED_OUTPATIENT_CLINIC_OR_DEPARTMENT_OTHER)
Admission: RE | Admit: 2020-04-12 | Discharge: 2020-04-12 | Disposition: A | Discharge status: Home / Self Care | Payer: Medicare Other | Source: Ambulatory Visit | Attending: Family | Admitting: Family

## 2020-04-12 ENCOUNTER — Other Ambulatory Visit: Payer: Self-pay

## 2020-04-12 DIAGNOSIS — Z1231 Encounter for screening mammogram for malignant neoplasm of breast: Secondary | ICD-10-CM | POA: Insufficient documentation

## 2020-04-13 ENCOUNTER — Ambulatory Visit (INDEPENDENT_AMBULATORY_CARE_PROVIDER_SITE_OTHER): Payer: Medicare Other | Admitting: Family

## 2020-04-13 ENCOUNTER — Encounter: Payer: Self-pay | Admitting: Family

## 2020-04-13 VITALS — BP 175/95 | HR 76 | Temp 98.8°F | Resp 16 | Ht 62.5 in | Wt 175.0 lb

## 2020-04-13 DIAGNOSIS — Z85038 Personal history of other malignant neoplasm of large intestine: Secondary | ICD-10-CM | POA: Diagnosis not present

## 2020-04-13 DIAGNOSIS — E039 Hypothyroidism, unspecified: Secondary | ICD-10-CM | POA: Diagnosis not present

## 2020-04-13 DIAGNOSIS — E119 Type 2 diabetes mellitus without complications: Secondary | ICD-10-CM

## 2020-04-13 DIAGNOSIS — R8290 Unspecified abnormal findings in urine: Secondary | ICD-10-CM | POA: Diagnosis not present

## 2020-04-13 DIAGNOSIS — E538 Deficiency of other specified B group vitamins: Secondary | ICD-10-CM | POA: Diagnosis not present

## 2020-04-13 DIAGNOSIS — I10 Essential (primary) hypertension: Secondary | ICD-10-CM | POA: Diagnosis not present

## 2020-04-13 DIAGNOSIS — D510 Vitamin B12 deficiency anemia due to intrinsic factor deficiency: Secondary | ICD-10-CM | POA: Diagnosis not present

## 2020-04-13 DIAGNOSIS — J4520 Mild intermittent asthma, uncomplicated: Secondary | ICD-10-CM | POA: Diagnosis not present

## 2020-04-13 LAB — CBC WITH DIFFERENTIAL/PLATELET
Basophils Absolute: 0 10*3/uL (ref 0.0–0.1)
Basophils Relative: 0.8 % (ref 0.0–3.0)
Eosinophils Absolute: 0.2 10*3/uL (ref 0.0–0.7)
Eosinophils Relative: 3.1 % (ref 0.0–5.0)
HCT: 43.1 % (ref 36.0–46.0)
Hemoglobin: 14.5 g/dL (ref 12.0–15.0)
Lymphocytes Relative: 27.6 % (ref 12.0–46.0)
Lymphs Abs: 1.4 10*3/uL (ref 0.7–4.0)
MCHC: 33.8 g/dL (ref 30.0–36.0)
MCV: 95 fl (ref 78.0–100.0)
Monocytes Absolute: 0.4 10*3/uL (ref 0.1–1.0)
Monocytes Relative: 8.5 % (ref 3.0–12.0)
Neutro Abs: 2.9 10*3/uL (ref 1.4–7.7)
Neutrophils Relative %: 60 % (ref 43.0–77.0)
Platelets: 106 10*3/uL — ABNORMAL LOW (ref 150.0–400.0)
RBC: 4.53 Mil/uL (ref 3.87–5.11)
RDW: 14 % (ref 11.5–15.5)
WBC: 4.9 10*3/uL (ref 4.0–10.5)

## 2020-04-13 LAB — URINALYSIS, ROUTINE W REFLEX MICROSCOPIC
Bilirubin Urine: NEGATIVE
Hgb urine dipstick: NEGATIVE
Ketones, ur: NEGATIVE
Nitrite: NEGATIVE
Specific Gravity, Urine: 1.01 (ref 1.000–1.030)
Total Protein, Urine: NEGATIVE
Urine Glucose: NEGATIVE
Urobilinogen, UA: 0.2 (ref 0.0–1.0)
pH: 7.5 (ref 5.0–8.0)

## 2020-04-13 LAB — COMPREHENSIVE METABOLIC PANEL
ALT: 34 U/L (ref 0–35)
AST: 38 U/L — ABNORMAL HIGH (ref 0–37)
Albumin: 3.6 g/dL (ref 3.5–5.2)
Alkaline Phosphatase: 88 U/L (ref 39–117)
BUN: 10 mg/dL (ref 6–23)
CO2: 29 mEq/L (ref 19–32)
Calcium: 8.8 mg/dL (ref 8.4–10.5)
Chloride: 102 mEq/L (ref 96–112)
Creatinine, Ser: 0.75 mg/dL (ref 0.40–1.20)
GFR: 75.78 mL/min (ref >60.00)
Glucose, Bld: 105 mg/dL — ABNORMAL HIGH (ref 70–99)
Potassium: 4.5 mEq/L (ref 3.5–5.1)
Sodium: 137 mEq/L (ref 135–145)
Total Bilirubin: 0.6 mg/dL (ref 0.2–1.2)
Total Protein: 7.8 g/dL (ref 6.0–8.3)

## 2020-04-13 LAB — VITAMIN B12: Vitamin B-12: 240 pg/mL (ref 211–911)

## 2020-04-13 LAB — HEMOGLOBIN A1C: Hgb A1c MFr Bld: 6.1 % (ref 4.6–6.5)

## 2020-04-13 LAB — MICROALBUMIN / CREATININE URINE RATIO
Creatinine,U: 35.2 mg/dL
Microalb Creat Ratio: 2 mg/g (ref 0.0–30.0)
Microalb, Ur: <0.7 mg/dL (ref 0.0–1.9)

## 2020-04-13 LAB — TSH: TSH: 0.69 u[IU]/mL (ref 0.35–4.50)

## 2020-04-13 MED ORDER — TRAZODONE HCL 50 MG PO TABS: ORAL_TABLET | ORAL | 1 refills | Status: DC

## 2020-04-13 MED ORDER — AMLODIPINE BESYLATE 2.5 MG PO TABS: 2.5000 mg | ORAL_TABLET | Freq: Every day | ORAL | 0 refills | Status: DC

## 2020-04-13 MED ORDER — PANTOPRAZOLE SODIUM 40 MG PO TBEC: 40.0000 mg | DELAYED_RELEASE_TABLET | Freq: Every day | ORAL | 1 refills | Status: DC

## 2020-04-13 MED FILL — AMLODIPINE 2.5 MG TABLET: 2.5 | 30 days supply | Qty: 30 | Fill #0

## 2020-04-13 MED FILL — traZODone HCL 50 MG TABS: 50 | 30 days supply | Qty: 30 | Fill #0

## 2020-04-13 NOTE — Patient Instructions (Addendum)
Please complete lab work prior to leaving. Begin trazodone as needed for sleep. Continue the good work with healthy diet, exercise and weight loss.  Please begin amlodipine 2.5mg  once daily for blood pressure.

## 2020-04-13 NOTE — Progress Notes (Signed)
Subjective:    Patient ID: Sheryl [name], female    DOB: [birth year], 74 y.o.   MRN: [id number]  HPI  Patient is a 74 yr old female who presents today for follow up.  DM2- reports that she is working on diet. Wt Readings from Last 3 Encounters:  04/13/20 175 lb (79.4 kg)  11/01/19 182 lb (82.6 kg)  10/19/19 185 lb 3.2 oz (84 kg)    Lab Results  Component Value Date   HGBA1C 6.3 11/10/2019   HGBA1C 6.1 (H) 04/29/2019   HGBA1C 6.4 05/24/2018   Lab Results  Component Value Date   MICROALBUR 5.5 (H) 08/03/2015   LDLCALC 123 (H) 12/19/2016   CREATININE 0.76 11/10/2019   Hypothyroid- continues synthroid.  Lab Results  Component Value Date   TSH 1.23 11/10/2019   RA/Sjogren's- reports chronic pain but she is used to it. Declines referral to rheumatology.  Post menopausal syndrome- continues estrace cream. 3x a week.  No vaginal dryness with this regimen.   Asthma- maintained on singulair, prn albuterol and Trelegy.    b12 deficiency- continues oral b12.   GERD- maintained on protonix.   Hx of colon cancer- saw Dr. Baird Cancer 12/07/19 (oncology)   Notes dark malodorous urine.    Reports poor sleep- sleeps 2-3 hrs/night. Has trouble falling asleep.  Small coffee in the AM. This is her only caffeine of the day.    Notes that she has had some insect bites on her forearms from gardening.  Has a bug bite at the base of her left thumb.    Review of Systems See HPI  Past Medical History:  Diagnosis Date  . Anemia    pernicious  . Aortic ectasia (HCC)    infrarenal abdomina aorta 2.6 cm  . Asthma   . Colon cancer ([facility]) 2012  . Diabetes mellitus    type 2  . Fatty liver 01/19/2015  . 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 History of pericarditis 07/26/2012  . Hyperlipidemia   . Pericarditis 07/26/2012  . Rheumatoid arthritis(714.0)   . Sjogren's syndrome ([facility]) 03/17/2011  . Small bowel obstruction ([facility]) 03/2014   ?due to adhesions  from colon surgery per pt  . Thyroid disease    hypothyroid  . Tobacco abuse      Social History   Socioeconomic History  . Marital status: Single    Spouse name: Not on file  . Number of children: 1  . Years of education: Not on file  . Highest education level: Not on file  Occupational History  . Occupation: unemployed    Employer: DISABLED  Tobacco Use  . Smoking status: Current Every Day Smoker    Types: Cigarettes  . Smokeless tobacco: Never Used  . Tobacco comment: 3 cigarettes per day, Uses smokeless cigarette.  Substance and Sexual Activity  . Alcohol use: No    Alcohol/week: 1.0 standard drink    Types: 1 Glasses of wine per week    Comment: Has not had alcohol since SBO 03/2014  . Drug use: Yes    Comment: Smokes "medical grade" marijuana 2 puffs 3 x weekly for rheumatoid arthritis.per pt  . Sexual activity: Never  Other Topics Concern  . Not on file  Social History Narrative   Regular exercise:  Stretching exercises. Resistance bands   Caffeine: 1 mug (2cus) daily.      Social Determinants of Health   Financial Resource Strain:   . Difficulty of Paying Living  Expenses: Not on file  Food Insecurity:   . Worried About Charity fundraiser in the Last Year: Not on file  . Ran Out of Food in the Last Year: Not on file  Transportation Needs:   . Lack of Transportation (Medical): Not on file  . Lack of Transportation (Non-Medical): Not on file  Physical Activity:   . Days of Exercise per Week: Not on file  . Minutes of Exercise per Session: Not on file  Stress:   . Feeling of Stress : Not on file  Social Connections:   . Frequency of Communication with Friends and Family: Not on file  . Frequency of Social Gatherings with Friends and Family: Not on file  . Attends Religious Services: Not on file  . Active Member of Clubs or Organizations: Not on file  . Attends Archivist Meetings: Not on file  . Marital Status: Not on file  Intimate Partner  Violence:   . Fear of Current or Ex-Partner: Not on file  . Emotionally Abused: Not on file  . Physically Abused: Not on file  . Sexually Abused: Not on file    Past Surgical History:  Procedure Laterality Date  . CHOLECYSTECTOMY  10/23/2016  . COLON SURGERY  8/.23/13   tumor removed from sigmoid colon.  Marland Kitchen DILATION AND CURETTAGE OF UTERUS  1975  . HERNIA REPAIR  10/23/2016  . KNEE SURGERY  2006   arthroscopic left knee  . KNEE SURGERY  2001   right knee    Family History  Problem Relation Age of Onset  . Heart disease Other        CAD  . Diabetes Other   . Hyperlipidemia Other   . Stroke Other     Allergies  Allergen Reactions  . Levofloxacin Shortness Of Breath and Rash  . Mold Extract [Trichophyton] Anaphylaxis  . Ativan [Lorazepam]     Visual hallucinations.  Visual hallucinations.   . Cefuroxime Axetil     REACTION: asthma, cough  . Hydrocodone-Homatropine Nausea Only  . Rosuvastatin Calcium     Muscle pain, syncope  . Statins     Muscle pain, syncope  . Zetia [Ezetimibe] Diarrhea  . Hydrocodone-Acetaminophen Nausea Only    Current Outpatient Medications on File Prior to Visit  Medication Sig Dispense Refill  . albuterol (VENTOLIN HFA) 108 (90 Base) MCG/ACT inhaler Inhale 2 puffs into the lungs every 6 (six) hours as needed for wheezing or shortness of breath. 54 g 1  . aspirin 81 MG tablet Take 81 mg by mouth daily.      . betamethasone dipropionate 0.05 % cream Apply topically 2 (two) times daily. 30 g 1  . Cyanocobalamin (B-12) 1000 MCG CAPS Take 1,000 mcg by mouth daily.    Marland Kitchen estradiol (ESTRACE VAGINAL) 0.1 MG/GM vaginal cream 2g daily intravaginally for 1 week, then reduce to 1 gram PV daily for 1 week , followed by a maintenance dose of 1 g PV once weekly 42.5 g 5  . fluticasone (FLONASE) 50 MCG/ACT nasal spray Place 2 sprays into both nostrils daily as needed. 29.7 g 1  . Fluticasone-Umeclidin-Vilant (TRELEGY ELLIPTA) 100-62.5-25 MCG/INH AEPB Take 1  puff by mouth daily. 180 each 0  . levothyroxine (SYNTHROID) 100 MCG tablet Take 1 tablet (100 mcg total) by mouth daily. 90 tablet 1  . montelukast (SINGULAIR) 10 MG tablet Take 1 tablet (10 mg total) by mouth at bedtime. 90 tablet 1  . multivitamin (THERAGRAN) per tablet Take 1  tablet by mouth daily.      . NON FORMULARY TUMERIC MILK.    . NON FORMULARY CBD. Derivative of marijuana without the marijuana.    . nystatin (MYCOSTATIN/NYSTOP) powder Apply topically 4 (four) times daily. 30 g 1  . pantoprazole (PROTONIX) 40 MG tablet TAKE 1 TABLET DAILY 90 tablet 1  . Probiotic Product (PROBIOTIC DAILY PO) Take 1 capsule by mouth daily.    . VOLTAREN 1 % GEL APPLY 2 GRAMS TOPICALLY 4 TIMES AS NEEDED 100 g 2   No current facility-administered medications on file prior to visit.    BP (!) 162/87 (BP Location: Right Arm, Patient Position: Sitting, Cuff Size: Small)   Pulse 76   Temp 98.8 F (37.1 C) (Oral)   Resp 16   Ht 5' 2.5" (1.588 m)   Wt 175 lb (79.4 kg)   SpO2 100%   BMI 31.50 kg/m       Objective:   Physical Exam Constitutional:      Appearance: She is well-developed.  Neck:     Thyroid: No thyromegaly.  Cardiovascular:     Rate and Rhythm: Normal rate and regular rhythm.     Heart sounds: Normal heart sounds. No murmur heard.   Pulmonary:     Effort: Pulmonary effort is normal. No respiratory distress.     Breath sounds: Normal breath sounds. No wheezing.  Musculoskeletal:     Cervical back: Neck supple.  Skin:    General: Skin is warm and dry.  Neurological:     Mental Status: She is alert and oriented to person, place, and time.  Psychiatric:        Behavior: Behavior normal.        Thought Content: Thought content normal.        Judgment: Judgment normal.           Assessment & Plan:  b12 deficiency- taking oral supplement. Check b12 level.  DM2- clinically stable, continue dm diet and weight loss efforts.  Insomnia- trial of  trazodone.  Hypothyroid- clinically stable on synthroid. Continue same, obtain TSH.   BP Readings from Last 3 Encounters:  04/13/20 (!) 175/95  11/01/19 124/80  10/19/19 (!) 152/91   HTN- bp quite elevated today. Will add amlodipine 2.5mg  once daily and have her bring her bp monitor to a 2 week follow up visit for comparison with our cuff.   Postmenopausal syndrome- pleased with estrace cream- continue same.  Asthma- stable on current regimen. Continue same.  GERD- stable on PPI. Continue same.  RA- at baseline. Monitor.  Foul smelling urine- check UA/Culture.  Hx of colon cancer- stable without recurrence- 7 years out.   This visit occurred during the SARS-CoV-2 public health emergency.  Safety protocols were in place, including screening questions prior to the visit, additional usage of staff PPE, and extensive cleaning of exam room while observing appropriate contact time as indicated for disinfecting solutions.

## 2020-04-13 NOTE — Addendum Note (Signed)
Addended by: Kelle Darting A on: 04/13/2020 12:20 PM   Modules accepted: Orders

## 2020-04-14 ENCOUNTER — Telehealth: Payer: Self-pay | Admitting: Family

## 2020-04-14 LAB — URINE CULTURE
MICRO NUMBER:: 10852764
SPECIMEN QUALITY:: ADEQUATE

## 2020-04-14 NOTE — Telephone Encounter (Signed)
Sugar is at goal.   Liver testing looks better. B12 level is on low side, I would recommend that she start b12 shots- 1038mcg IM weekly x 4 weeks then 1031mcg IM monthly. She can stop oral b12 when she starts shots. Platelets a little low. Repeat cbc in 1 month, dx thrombocytopenia.

## 2020-04-16 ENCOUNTER — Telehealth: Payer: Self-pay | Admitting: Family

## 2020-04-16 NOTE — Telephone Encounter (Signed)
Patient states would like a call back to go over las results.   Please Advise

## 2020-04-16 NOTE — Telephone Encounter (Signed)
Yes please- dx dyslipidemia. She can do when she does cbc.

## 2020-04-16 NOTE — Telephone Encounter (Signed)
Called patient

## 2020-04-16 NOTE — Telephone Encounter (Signed)
-  All results given to patient in detail , she verbalized understanding. Will dc oral b12 and is scheduled for first b12 shot on Friday.   Patient wanted to know if she can get a lipid panel. She has follow up 04-27-2020.

## 2020-04-17 NOTE — Telephone Encounter (Signed)
Called but n/a, will try again later

## 2020-04-18 ENCOUNTER — Other Ambulatory Visit: Payer: Self-pay

## 2020-04-18 DIAGNOSIS — D696 Thrombocytopenia, unspecified: Secondary | ICD-10-CM

## 2020-04-18 DIAGNOSIS — E785 Hyperlipidemia, unspecified: Secondary | ICD-10-CM

## 2020-04-18 NOTE — Telephone Encounter (Signed)
Left detail message for patient to be aware Lipid profile orders added with cbc orders to be done in one month

## 2020-04-20 ENCOUNTER — Ambulatory Visit (INDEPENDENT_AMBULATORY_CARE_PROVIDER_SITE_OTHER): Payer: Medicare Other

## 2020-04-20 ENCOUNTER — Other Ambulatory Visit: Payer: Self-pay

## 2020-04-20 DIAGNOSIS — E538 Deficiency of other specified B group vitamins: Secondary | ICD-10-CM | POA: Diagnosis not present

## 2020-04-20 MED ORDER — CYANOCOBALAMIN 1000 MCG/ML IJ SOLN
1000.0000 ug | Freq: Once | INTRAMUSCULAR | Status: AC
  Administered 2020-04-20: 1000 ug via INTRAMUSCULAR

## 2020-04-20 NOTE — Progress Notes (Addendum)
Pt here for weekly b12 injection per [name]  B12 1031mcg given IM left deltoid, and pt tolerated injection well.  Next B12 injection scheduled for next week at ov with provider.    Above CMA note reviewed.  [name] NP

## 2020-04-24 ENCOUNTER — Other Ambulatory Visit: Payer: Self-pay | Admitting: Pulmonary Disease

## 2020-04-25 ENCOUNTER — Other Ambulatory Visit: Payer: Self-pay | Admitting: Pulmonary Disease

## 2020-04-25 MED FILL — TRELEGY ELLIPTA 100-62.5-25: 100-62.5-25 | 90 days supply | Qty: 180 | Fill #0

## 2020-04-25 NOTE — Telephone Encounter (Signed)
Spoke with patient regarding refill request for Trellegy 100. Advised patient I sent in her refill to [facility]. Patient's voice was understanding. Nothing else further needed.

## 2020-04-25 NOTE — Telephone Encounter (Signed)
Left a voicemail on patient's phone to call out office back regarding refill request for Trelegy 100.

## 2020-04-25 NOTE — Telephone Encounter (Signed)
Pt returning a phone call. Pt can be reached at [phone]

## 2020-04-27 ENCOUNTER — Ambulatory Visit (INDEPENDENT_AMBULATORY_CARE_PROVIDER_SITE_OTHER): Payer: Medicare Other | Admitting: Family

## 2020-04-27 ENCOUNTER — Encounter: Payer: Self-pay | Admitting: Family

## 2020-04-27 ENCOUNTER — Other Ambulatory Visit: Payer: Self-pay

## 2020-04-27 VITALS — BP 144/72 | HR 79 | Temp 98.6°F | Resp 16 | Ht 62.5 in | Wt 175.0 lb

## 2020-04-27 DIAGNOSIS — I10 Essential (primary) hypertension: Secondary | ICD-10-CM | POA: Diagnosis not present

## 2020-04-27 DIAGNOSIS — E785 Hyperlipidemia, unspecified: Secondary | ICD-10-CM | POA: Diagnosis not present

## 2020-04-27 DIAGNOSIS — D696 Thrombocytopenia, unspecified: Secondary | ICD-10-CM | POA: Diagnosis not present

## 2020-04-27 DIAGNOSIS — E538 Deficiency of other specified B group vitamins: Secondary | ICD-10-CM | POA: Diagnosis not present

## 2020-04-27 MED ORDER — CYANOCOBALAMIN 1000 MCG/ML IJ SOLN
1000.0000 ug | Freq: Once | INTRAMUSCULAR | Status: AC
  Administered 2020-04-27: 1000 ug via INTRAMUSCULAR

## 2020-04-27 NOTE — Progress Notes (Signed)
Subjective:    Patient ID: Sheryl [name], female    DOB: 08/16/1946, 74 y.o.   MRN: [id number]  HPI  Patient is a 74 yr old female who presents today for follow up of her hypertension. Last visit we added amlodipine 2.5 mg once daily.    BP Readings from Last 3 Encounters:  04/27/20 (!) 144/72  04/13/20 (!) 175/95  11/01/19 124/80   b12 deficiency- due for second b12 shot today.    Review of Systems    see HPI  Past Medical History:  Diagnosis Date  . Anemia    pernicious  . Aortic ectasia (HCC)    infrarenal abdomina aorta 2.6 cm  . Asthma   . Colon cancer ([facility]) 2012  . Diabetes mellitus    type 2  . Fatty liver 01/19/2015  . 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 History of pericarditis 07/26/2012  . Hyperlipidemia   . Pericarditis 07/26/2012  . Rheumatoid arthritis(714.0)   . Sjogren's syndrome ([facility]) 03/17/2011  . Small bowel obstruction ([facility]) 03/2014   ?due to adhesions from colon surgery per pt  . Thyroid disease    hypothyroid  . Tobacco abuse      Social History   Socioeconomic History  . Marital status: Single    Spouse name: Not on file  . Number of children: 1  . Years of education: Not on file  . Highest education level: Not on file  Occupational History  . Occupation: unemployed    Employer: DISABLED  Tobacco Use  . Smoking status: Current Every Day Smoker    Types: Cigarettes  . Smokeless tobacco: Never Used  . Tobacco comment: 3 cigarettes per day, Uses smokeless cigarette.  Substance and Sexual Activity  . Alcohol use: No    Alcohol/week: 1.0 standard drink    Types: 1 Glasses of wine per week    Comment: Has not had alcohol since SBO 03/2014  . Drug use: Yes    Comment: Smokes "medical grade" marijuana 2 puffs 3 x weekly for rheumatoid arthritis.per pt  . Sexual activity: Never  Other Topics Concern  . Not on file  Social History Narrative   Regular exercise:  Stretching exercises. Resistance  bands   Caffeine: 1 mug (2cus) daily.      Social Determinants of Health   Financial Resource Strain:   . Difficulty of Paying Living Expenses: Not on file  Food Insecurity:   . Worried About Charity fundraiser in the Last Year: Not on file  . Ran Out of Food in the Last Year: Not on file  Transportation Needs:   . Lack of Transportation (Medical): Not on file  . Lack of Transportation (Non-Medical): Not on file  Physical Activity:   . Days of Exercise per Week: Not on file  . Minutes of Exercise per Session: Not on file  Stress:   . Feeling of Stress : Not on file  Social Connections:   . Frequency of Communication with Friends and Family: Not on file  . Frequency of Social Gatherings with Friends and Family: Not on file  . Attends Religious Services: Not on file  . Active Member of Clubs or Organizations: Not on file  . Attends Archivist Meetings: Not on file  . Marital Status: Not on file  Intimate Partner Violence:   . Fear of Current or Ex-Partner: Not on file  . Emotionally Abused: Not on file  . Physically Abused: Not  on file  . Sexually Abused: Not on file    Past Surgical History:  Procedure Laterality Date  . CHOLECYSTECTOMY  10/23/2016  . COLON SURGERY  8/.23/13   tumor removed from sigmoid colon.  Marland Kitchen DILATION AND CURETTAGE OF UTERUS  1975  . HERNIA REPAIR  10/23/2016  . KNEE SURGERY  2006   arthroscopic left knee  . KNEE SURGERY  2001   right knee    Family History  Problem Relation Age of Onset  . Heart disease Other        CAD  . Diabetes Other   . Hyperlipidemia Other   . Stroke Other     Allergies  Allergen Reactions  . Levofloxacin Shortness Of Breath and Rash  . Mold Extract [Trichophyton] Anaphylaxis  . Ativan [Lorazepam]     Visual hallucinations.  Visual hallucinations.   . Cefuroxime Axetil     REACTION: asthma, cough  . Hydrocodone-Homatropine Nausea Only  . Rosuvastatin Calcium     Muscle pain, syncope  . Statins       Muscle pain, syncope  . Zetia [Ezetimibe] Diarrhea  . Hydrocodone-Acetaminophen Nausea Only    Current Outpatient Medications on File Prior to Visit  Medication Sig Dispense Refill  . albuterol (VENTOLIN HFA) 108 (90 Base) MCG/ACT inhaler Inhale 2 puffs into the lungs every 6 (six) hours as needed for wheezing or shortness of breath. 54 g 1  . amLODipine (NORVASC) 2.5 MG tablet Take 1 tablet (2.5 mg total) by mouth daily. 30 tablet 0  . aspirin 81 MG tablet Take 81 mg by mouth daily.      . betamethasone dipropionate 0.05 % cream Apply topically 2 (two) times daily. 30 g 1  . estradiol (ESTRACE VAGINAL) 0.1 MG/GM vaginal cream 2g daily intravaginally for 1 week, then reduce to 1 gram PV daily for 1 week , followed by a maintenance dose of 1 g PV once weekly 42.5 g 5  . fluticasone (FLONASE) 50 MCG/ACT nasal spray Place 2 sprays into both nostrils daily as needed. 29.7 g 1  . levothyroxine (SYNTHROID) 100 MCG tablet Take 1 tablet (100 mcg total) by mouth daily. 90 tablet 1  . montelukast (SINGULAIR) 10 MG tablet Take 1 tablet (10 mg total) by mouth at bedtime. 90 tablet 1  . multivitamin (THERAGRAN) per tablet Take 1 tablet by mouth daily.      . NON FORMULARY TUMERIC MILK.    . NON FORMULARY CBD. Derivative of marijuana without the marijuana.    . nystatin (MYCOSTATIN/NYSTOP) powder Apply topically 4 (four) times daily. 30 g 1  . pantoprazole (PROTONIX) 40 MG tablet Take 1 tablet (40 mg total) by mouth daily. 90 tablet 1  . Probiotic Product (PROBIOTIC DAILY PO) Take 1 capsule by mouth daily.    . TRELEGY ELLIPTA 100-62.5-25 MCG/INH AEPB INHALE 1 PUFF BY MOUTH INTO THE LUNGS DAILY. 180 each 1  . VOLTAREN 1 % GEL APPLY 2 GRAMS TOPICALLY 4 TIMES AS NEEDED 100 g 2  . traZODone (DESYREL) 50 MG tablet 1/2 to 1 tab by mouth at bedtime as needed for insomnia (Patient not taking: Reported on 04/27/2020) 30 tablet 1   No current facility-administered medications on file prior to visit.    BP (!)  144/72 (BP Location: Right Arm, Patient Position: Sitting, Cuff Size: Small)   Pulse 79   Temp 98.6 F (37 C) (Oral)   Resp 16   Ht 5' 2.5" (1.588 m)   Wt 175 lb (79.4 kg)  SpO2 99%   BMI 31.50 kg/m    Objective:   Physical Exam Constitutional:      Appearance: She is well-developed.  Neck:     Thyroid: No thyromegaly.  Cardiovascular:     Rate and Rhythm: Normal rate and regular rhythm.     Heart sounds: Normal heart sounds. No murmur heard.   Pulmonary:     Effort: Pulmonary effort is normal. No respiratory distress.     Breath sounds: Normal breath sounds. No wheezing.  Musculoskeletal:     Cervical back: Neck supple.  Skin:    General: Skin is warm and dry.  Neurological:     Mental Status: She is alert and oriented to person, place, and time.  Psychiatric:        Behavior: Behavior normal.        Thought Content: Thought content normal.        Judgment: Judgment normal.           Assessment & Plan:  HTN- bp stable/improved with the addition of amlodipine. Continue same.  Home reading is similar to our reading today.  B12 deficiency- continue b12 injections.  Injection given today.  Thrombocytopenia- repeat CBC.  Hyperlipididemia- obtain follow up lipid panel  This visit occurred during the SARS-CoV-2 public health emergency.  Safety protocols were in place, including screening questions prior to the visit, additional usage of staff PPE, and extensive cleaning of exam room while observing appropriate contact time as indicated for disinfecting solutions.

## 2020-04-27 NOTE — Patient Instructions (Signed)
Please complete lab work prior to leaving.   

## 2020-04-28 LAB — CBC WITH DIFFERENTIAL/PLATELET
Absolute Monocytes: 405 cells/uL (ref 200–950)
Basophils Absolute: 38 cells/uL (ref 0–200)
Basophils Relative: 0.7 %
Eosinophils Absolute: 157 cells/uL (ref 15–500)
Eosinophils Relative: 2.9 %
HCT: 44 % (ref 35.0–45.0)
Hemoglobin: 15 g/dL (ref 11.7–15.5)
Lymphs Abs: 1625 cells/uL (ref 850–3900)
MCH: 31.4 pg (ref 27.0–33.0)
MCHC: 34.1 g/dL (ref 32.0–36.0)
MCV: 92.2 fL (ref 80.0–100.0)
MPV: 11.7 fL (ref 7.5–12.5)
Monocytes Relative: 7.5 %
Neutro Abs: 3175 cells/uL (ref 1500–7800)
Neutrophils Relative %: 58.8 %
Platelets: 132 10*3/uL — ABNORMAL LOW (ref 140–400)
RBC: 4.77 10*6/uL (ref 3.80–5.10)
RDW: 13 % (ref 11.0–15.0)
Total Lymphocyte: 30.1 %
WBC: 5.4 10*3/uL (ref 3.8–10.8)

## 2020-04-28 LAB — LIPID PANEL
Cholesterol: 173 mg/dL (ref <200)
HDL: 26 mg/dL — ABNORMAL LOW (ref >=50)
LDL Cholesterol (Calc): 116 mg/dL (calc) — ABNORMAL HIGH
Non-HDL Cholesterol (Calc): 147 mg/dL (calc) — ABNORMAL HIGH (ref <130)
Total CHOL/HDL Ratio: 6.7 (calc) — ABNORMAL HIGH (ref <5.0)
Triglycerides: 193 mg/dL — ABNORMAL HIGH (ref <150)

## 2020-05-04 ENCOUNTER — Other Ambulatory Visit: Payer: Self-pay

## 2020-05-04 ENCOUNTER — Ambulatory Visit (INDEPENDENT_AMBULATORY_CARE_PROVIDER_SITE_OTHER): Payer: Medicare Other

## 2020-05-04 DIAGNOSIS — E538 Deficiency of other specified B group vitamins: Secondary | ICD-10-CM

## 2020-05-04 MED ORDER — CYANOCOBALAMIN 1000 MCG/ML IJ SOLN
1000.0000 ug | Freq: Once | INTRAMUSCULAR | Status: AC
  Administered 2020-05-04: 1000 ug via INTRAMUSCULAR

## 2020-05-04 NOTE — Progress Notes (Signed)
Pt here for weekly B12 injection per   B12 1031mcg given IM in left deltoid, and pt tolerated injection well.  Next B12 injection scheduled for next week.

## 2020-05-11 ENCOUNTER — Other Ambulatory Visit: Payer: Self-pay

## 2020-05-11 ENCOUNTER — Ambulatory Visit (INDEPENDENT_AMBULATORY_CARE_PROVIDER_SITE_OTHER): Payer: Medicare Other

## 2020-05-11 DIAGNOSIS — E538 Deficiency of other specified B group vitamins: Secondary | ICD-10-CM | POA: Diagnosis not present

## 2020-05-11 DIAGNOSIS — Z23 Encounter for immunization: Secondary | ICD-10-CM

## 2020-05-11 MED ORDER — CYANOCOBALAMIN 1000 MCG/ML IJ SOLN
1000.0000 ug | Freq: Once | INTRAMUSCULAR | Status: AC
  Administered 2020-05-11: 1000 ug via INTRAMUSCULAR

## 2020-05-11 NOTE — Progress Notes (Signed)
Pt here for monthly B12 injection per [name]  B12 1066mcg given IM, and pt tolerated injection well.  Next B12 injection scheduled for next month.   Patient also received high dose flu vaccine.

## 2020-05-23 DIAGNOSIS — H52223 Regular astigmatism, bilateral: Secondary | ICD-10-CM | POA: Diagnosis not present

## 2020-05-23 DIAGNOSIS — H5213 Myopia, bilateral: Secondary | ICD-10-CM | POA: Diagnosis not present

## 2020-05-23 DIAGNOSIS — H2513 Age-related nuclear cataract, bilateral: Secondary | ICD-10-CM | POA: Diagnosis not present

## 2020-05-23 DIAGNOSIS — H524 Presbyopia: Secondary | ICD-10-CM | POA: Diagnosis not present

## 2020-05-23 DIAGNOSIS — H04123 Dry eye syndrome of bilateral lacrimal glands: Secondary | ICD-10-CM | POA: Diagnosis not present

## 2020-05-23 DIAGNOSIS — H01001 Unspecified blepharitis right upper eyelid: Secondary | ICD-10-CM | POA: Diagnosis not present

## 2020-05-23 DIAGNOSIS — H01004 Unspecified blepharitis left upper eyelid: Secondary | ICD-10-CM | POA: Diagnosis not present

## 2020-06-03 ENCOUNTER — Other Ambulatory Visit: Payer: Self-pay | Admitting: Medical

## 2020-06-06 ENCOUNTER — Other Ambulatory Visit: Payer: Self-pay | Admitting: Family

## 2020-06-15 ENCOUNTER — Ambulatory Visit (INDEPENDENT_AMBULATORY_CARE_PROVIDER_SITE_OTHER): Payer: Medicare Other

## 2020-06-15 ENCOUNTER — Other Ambulatory Visit: Payer: Self-pay

## 2020-06-15 DIAGNOSIS — E538 Deficiency of other specified B group vitamins: Secondary | ICD-10-CM | POA: Diagnosis not present

## 2020-06-15 MED ORDER — CYANOCOBALAMIN 1000 MCG/ML IJ SOLN
1000.0000 ug | Freq: Once | INTRAMUSCULAR | Status: AC
  Administered 2020-06-15: 1000 ug via INTRAMUSCULAR

## 2020-06-15 NOTE — Progress Notes (Signed)
Pt here for monthly B12 injection per [name], NP  B12 1028mcg given IM left deltoid, and pt tolerated injection well.  Next B12 injection scheduled for 07/13/20 at 11:30

## 2020-07-13 ENCOUNTER — Ambulatory Visit (INDEPENDENT_AMBULATORY_CARE_PROVIDER_SITE_OTHER): Payer: Medicare Other

## 2020-07-13 ENCOUNTER — Other Ambulatory Visit: Payer: Self-pay

## 2020-07-13 DIAGNOSIS — E538 Deficiency of other specified B group vitamins: Secondary | ICD-10-CM | POA: Diagnosis not present

## 2020-07-13 MED ORDER — CYANOCOBALAMIN 1000 MCG/ML IJ SOLN
1000.0000 ug | Freq: Once | INTRAMUSCULAR | Status: AC
  Administered 2020-07-13: 1000 ug via INTRAMUSCULAR

## 2020-07-13 NOTE — Progress Notes (Addendum)
Pt here today for B12 injection.   Cyanocobalamin 1050mcg 59mL injected into L deltoid. Pt tolerated injection well.   Next in 4 weeks.    CMA note reviewed and agree.  [name] NP

## 2020-07-23 ENCOUNTER — Other Ambulatory Visit (HOSPITAL_BASED_OUTPATIENT_CLINIC_OR_DEPARTMENT_OTHER): Payer: Self-pay

## 2020-07-23 MED FILL — TRELEGY ELLIPTA 100-62.5-25: 100-62.5-25 | 30 days supply | Qty: 60 | Fill #1

## 2020-07-23 MED FILL — CLINDAMYCIN HCL 150 MG CAPS: 150 | 3 days supply | Qty: 6 | Fill #0

## 2020-07-26 ENCOUNTER — Ambulatory Visit (INDEPENDENT_AMBULATORY_CARE_PROVIDER_SITE_OTHER): Payer: Medicare Other

## 2020-07-26 VITALS — Temp 97.5°F | Ht 62.5 in | Wt 172.0 lb

## 2020-07-26 DIAGNOSIS — Z780 Asymptomatic menopausal state: Secondary | ICD-10-CM

## 2020-07-26 DIAGNOSIS — Z0000 Encounter for general adult medical examination without abnormal findings: Secondary | ICD-10-CM

## 2020-07-26 NOTE — Patient Instructions (Signed)
Sheryl [name] , Thank you for taking time to complete your Medicare Wellness Visit. I appreciate your ongoing commitment to your health goals. Please review the following plan we discussed and let me know if I can assist you in the future.   Screening recommendations/referrals: Colonoscopy: Completed 01/02/2015-Due-01/01/2025 Mammogram: Completed 04/12/2020-Due-04/12/2021 Bone Density: Ordered today. Someone will be calling you to schedule. Recommended yearly ophthalmology/optometry visit for glaucoma screening and checkup Recommended yearly dental visit for hygiene and checkup  Vaccinations: Influenza vaccine: Up to date Pneumococcal vaccine: Completed vaccines Tdap vaccine: Up to date- Due-02/15/2023 Shingles vaccine: Discuss with pharmacy Covid-19:Completed vaccines  Advanced directives: Please bring a copy for your chart  Conditions/risks identified: See problem list  Next appointment: Follow up in one year for your annual wellness visit    Preventive Care 65 Years and Older, Female Preventive care refers to lifestyle choices and visits with your health care provider that can promote health and wellness. What does preventive care include?  A yearly physical exam. This is also called an annual well check.  Dental exams once or twice a year.  Routine eye exams. Ask your health care provider how often you should have your eyes checked.  Personal lifestyle choices, including:  Daily care of your teeth and gums.  Regular physical activity.  Eating a healthy diet.  Avoiding tobacco and drug use.  Limiting alcohol use.  Practicing safe sex.  Taking low-dose aspirin every day.  Taking vitamin and mineral supplements as recommended by your health care provider. What happens during an annual well check? The services and screenings done by your health care provider during your annual well check will depend on your age, overall health, lifestyle risk factors, and family  history of disease. Counseling  Your health care provider may ask you questions about your:  Alcohol use.  Tobacco use.  Drug use.  Emotional well-being.  Home and relationship well-being.  Sexual activity.  Eating habits.  History of falls.  Memory and ability to understand (cognition).  Work and work Statistician.  Reproductive health. Screening  You may have the following tests or measurements:  Height, weight, and BMI.  Blood pressure.  Lipid and cholesterol levels. These may be checked every 5 years, or more frequently if you are over 73 years old.  Skin check.  Lung cancer screening. You may have this screening every year starting at age 73 if you have a 30-pack-year history of smoking and currently smoke or have quit within the past 15 years.  Fecal occult blood test (FOBT) of the stool. You may have this test every year starting at age 20.  Flexible sigmoidoscopy or colonoscopy. You may have a sigmoidoscopy every 5 years or a colonoscopy every 10 years starting at age 73.  Hepatitis C blood test.  Hepatitis B blood test.  Sexually transmitted disease (STD) testing.  Diabetes screening. This is done by checking your blood sugar (glucose) after you have not eaten for a while (fasting). You may have this done every 1-3 years.  Bone density scan. This is done to screen for osteoporosis. You may have this done starting at age 73.  Mammogram. This may be done every 1-2 years. Talk to your health care provider about how often you should have regular mammograms. Talk with your health care provider about your test results, treatment options, and if necessary, the need for more tests. Vaccines  Your health care provider may recommend certain vaccines, such as:  Influenza vaccine. This is recommended every year.  Tetanus, diphtheria, and acellular pertussis (Tdap, Td) vaccine. You may need a Td booster every 10 years.  Zoster vaccine. You may need this after  age 73.  Pneumococcal 13-valent conjugate (PCV13) vaccine. One dose is recommended after age 82.  Pneumococcal polysaccharide (PPSV23) vaccine. One dose is recommended after age 71. Talk to your health care provider about which screenings and vaccines you need and how often you need them. This information is not intended to replace advice given to you by your health care provider. Make sure you discuss any questions you have with your health care provider. Document Released: 09/07/2015 Document Revised: 04/30/2016 Document Reviewed: 06/12/2015 Elsevier Interactive Patient Education  2017 [place] Prevention in the Home Falls can cause injuries. They can happen to people of all ages. There are many things you can do to make your home safe and to help prevent falls. What can I do on the outside of my home?  Regularly fix the edges of walkways and driveways and fix any cracks.  Remove anything that might make you trip as you walk through a door, such as a raised step or threshold.  Trim any bushes or trees on the path to your home.  Use bright outdoor lighting.  Clear any walking paths of anything that might make someone trip, such as rocks or tools.  Regularly check to see if handrails are loose or broken. Make sure that both sides of any steps have handrails.  Any raised decks and porches should have guardrails on the edges.  Have any leaves, snow, or ice cleared regularly.  Use sand or salt on walking paths during winter.  Clean up any spills in your garage right away. This includes oil or grease spills. What can I do in the bathroom?  Use night lights.  Install grab bars by the toilet and in the tub and shower. Do not use towel bars as grab bars.  Use non-skid mats or decals in the tub or shower.  If you need to sit down in the shower, use a plastic, non-slip stool.  Keep the floor dry. Clean up any water that spills on the floor as soon as it  happens.  Remove soap buildup in the tub or shower regularly.  Attach bath mats securely with double-sided non-slip rug tape.  Do not have throw rugs and other things on the floor that can make you trip. What can I do in the bedroom?  Use night lights.  Make sure that you have a light by your bed that is easy to reach.  Do not use any sheets or blankets that are too big for your bed. They should not hang down onto the floor.  Have a firm chair that has side arms. You can use this for support while you get dressed.  Do not have throw rugs and other things on the floor that can make you trip. What can I do in the kitchen?  Clean up any spills right away.  Avoid walking on wet floors.  Keep items that you use a lot in easy-to-reach places.  If you need to reach something above you, use a strong step stool that has a grab bar.  Keep electrical cords out of the way.  Do not use floor polish or wax that makes floors slippery. If you must use wax, use non-skid floor wax.  Do not have throw rugs and other things on the floor that can make you trip. What can I do  with my stairs?  Do not leave any items on the stairs.  Make sure that there are handrails on both sides of the stairs and use them. Fix handrails that are broken or loose. Make sure that handrails are as long as the stairways.  Check any carpeting to make sure that it is firmly attached to the stairs. Fix any carpet that is loose or worn.  Avoid having throw rugs at the top or bottom of the stairs. If you do have throw rugs, attach them to the floor with carpet tape.  Make sure that you have a light switch at the top of the stairs and the bottom of the stairs. If you do not have them, ask someone to add them for you. What else can I do to help prevent falls?  Wear shoes that:  Do not have high heels.  Have rubber bottoms.  Are comfortable and fit you well.  Are closed at the toe. Do not wear sandals.  If you  use a stepladder:  Make sure that it is fully opened. Do not climb a closed stepladder.  Make sure that both sides of the stepladder are locked into place.  Ask someone to hold it for you, if possible.  Clearly mark and make sure that you can see:  Any grab bars or handrails.  First and last steps.  Where the edge of each step is.  Use tools that help you move around (mobility aids) if they are needed. These include:  Canes.  Walkers.  Scooters.  Crutches.  Turn on the lights when you go into a dark area. Replace any light bulbs as soon as they burn out.  Set up your furniture so you have a clear path. Avoid moving your furniture around.  If any of your floors are uneven, fix them.  If there are any pets around you, be aware of where they are.  Review your medicines with your doctor. Some medicines can make you feel dizzy. This can increase your chance of falling. Ask your doctor what other things that you can do to help prevent falls. This information is not intended to replace advice given to you by your health care provider. Make sure you discuss any questions you have with your health care provider. Document Released: 06/07/2009 Document Revised: 01/17/2016 Document Reviewed: 09/15/2014 Elsevier Interactive Patient Education  2017 [employer].

## 2020-07-26 NOTE — Progress Notes (Signed)
Subjective:   Sheryl [name] is a 74 y.o. female who presents for Medicare Annual (Subsequent) preventive examination.  I connected with Carra today by telephone and verified that I am speaking with the correct person using two identifiers. Location patient: home Location provider: work Persons participating in the virtual visit: patient, Marine scientist.    I discussed the limitations, risks, security and privacy concerns of performing an evaluation and management service by telephone and the availability of in person appointments. I also discussed with the patient that there may be a patient responsible charge related to this service. The patient expressed understanding and verbally consented to this telephonic visit.    Interactive audio and video telecommunications were attempted between this provider and patient, however failed, due to patient having technical difficulties OR patient did not have access to video capability.  We continued and completed visit with audio only.  Some vital signs may be absent or patient reported.   Time Spent with patient on telephone encounter: 60 minutes    Review of Systems     Cardiac Risk Factors include: advanced age (>96men, >12 women);dyslipidemia;diabetes mellitus;smoking/ tobacco exposure     Objective:    Today's Vitals   07/26/20 1106  Temp: (!) 97.5 F (36.4 C)  Weight: 172 lb (78 kg)  Height: 5' 2.5" (1.588 m)   Body mass index is 30.96 kg/m.  Advanced Directives 07/26/2020 05/27/2019 05/20/2018 05/19/2017 09/20/2015  Does Patient Have a Medical Advance Directive? Yes Yes Yes Yes Yes  Type of Paramedic of [place];Living will [place];Living will [place];Living will Living will [place];Living will  Does patient want to make changes to medical advance directive? - No - Patient declined - No - Patient declined -  Copy of [place]  in Chart? No - copy requested No - copy requested No - copy requested - -    Current Medications (verified) Outpatient Encounter Medications as of 07/26/2020  Medication Sig  . albuterol (VENTOLIN HFA) 108 (90 Base) MCG/ACT inhaler Inhale 2 puffs into the lungs every 6 (six) hours as needed for wheezing or shortness of breath.  Marland Kitchen amLODipine (NORVASC) 2.5 MG tablet Take 1 tablet (2.5 mg total) by mouth daily.  Marland Kitchen aspirin 81 MG tablet Take 81 mg by mouth daily.    . betamethasone dipropionate 0.05 % cream Apply topically 2 (two) times daily.  Marland Kitchen estradiol (ESTRACE VAGINAL) 0.1 MG/GM vaginal cream 2g daily intravaginally for 1 week, then reduce to 1 gram PV daily for 1 week , followed by a maintenance dose of 1 g PV once weekly  . fluticasone (FLONASE) 50 MCG/ACT nasal spray Place 2 sprays into both nostrils daily as needed.  Marland Kitchen levothyroxine (SYNTHROID) 100 MCG tablet TAKE 1 TABLET DAILY  . montelukast (SINGULAIR) 10 MG tablet TAKE 1 TABLET AT BEDTIME  . multivitamin (THERAGRAN) per tablet Take 1 tablet by mouth daily.    . NON FORMULARY TUMERIC MILK.  . NON FORMULARY CBD. Derivative of marijuana without the marijuana.  . nystatin (MYCOSTATIN/NYSTOP) powder Apply topically 4 (four) times daily.  . pantoprazole (PROTONIX) 40 MG tablet Take 1 tablet (40 mg total) by mouth daily.  . Probiotic Product (PROBIOTIC DAILY PO) Take 1 capsule by mouth daily.  . TRELEGY ELLIPTA 100-62.5-25 MCG/INH AEPB INHALE 1 PUFF BY MOUTH INTO THE LUNGS DAILY.  . VOLTAREN 1 % GEL APPLY 2 GRAMS TOPICALLY 4 TIMES AS NEEDED  . traZODone (DESYREL) 50  MG tablet 1/2 to 1 tab by mouth at bedtime as needed for insomnia (Patient not taking: Reported on 07/26/2020)   No facility-administered encounter medications on file as of 07/26/2020.    Allergies (verified) Levofloxacin, Mold extract [trichophyton], Ativan [lorazepam], Cefuroxime axetil, Hydrocodone-homatropine, Rosuvastatin calcium, Statins, Zetia [ezetimibe], and  Hydrocodone-acetaminophen   History: Past Medical History:  Diagnosis Date  . Anemia    pernicious  . Aortic ectasia (HCC)    infrarenal abdomina aorta 2.6 cm  . Asthma   . Colon cancer ([place]) 2012  . Diabetes mellitus    type 2  . Fatty liver 01/19/2015  . 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 History of pericarditis 07/26/2012  . Hyperlipidemia   . Pericarditis 07/26/2012  . Rheumatoid arthritis(714.0)   . Sjogren's syndrome ([place]) 03/17/2011  . Small bowel obstruction ([place]) 03/2014   ?due to adhesions from colon surgery per pt  . Thyroid disease    hypothyroid  . Tobacco abuse    Past Surgical History:  Procedure Laterality Date  . CHOLECYSTECTOMY  10/23/2016  . COLON SURGERY  8/.23/13   tumor removed from sigmoid colon.  Marland Kitchen DILATION AND CURETTAGE OF UTERUS  1975  . HERNIA REPAIR  10/23/2016  . KNEE SURGERY  2006   arthroscopic left knee  . KNEE SURGERY  2001   right knee   Family History  Problem Relation Age of Onset  . Heart disease Other        CAD  . Diabetes Other   . Hyperlipidemia Other   . Stroke Other    Social History   Socioeconomic History  . Marital status: Single    Spouse name: Not on file  . Number of children: 1  . Years of education: Not on file  . Highest education level: Not on file  Occupational History  . Occupation: unemployed    Employer: DISABLED  Tobacco Use  . Smoking status: Current Every Day Smoker    Types: Cigarettes  . Smokeless tobacco: Never Used  . Tobacco comment: smokes occasionally but not everyday  Substance and Sexual Activity  . Alcohol use: Yes    Alcohol/week: 1.0 standard drink    Types: 1 Glasses of wine per week    Comment: occasionally  . Drug use: Yes    Comment: Smokes "medical grade" marijuana 2 puffs 3 x weekly for rheumatoid arthritis.per pt  . Sexual activity: Never  Other Topics Concern  . Not on file  Social History Narrative   Regular exercise:  Stretching  exercises. Resistance bands   Caffeine: 1 mug (2cus) daily.      Social Determinants of Health   Financial Resource Strain: Low Risk   . Difficulty of Paying Living Expenses: Not hard at all  Food Insecurity: No Food Insecurity  . Worried About Charity fundraiser in the Last Year: Never true  . Ran Out of Food in the Last Year: Never true  Transportation Needs: No Transportation Needs  . Lack of Transportation (Medical): No  . Lack of Transportation (Non-Medical): No  Physical Activity: Sufficiently Active  . Days of Exercise per Week: 7 days  . Minutes of Exercise per Session: 30 min  Stress: No Stress Concern Present  . Feeling of Stress : Not at all  Social Connections: Moderately Isolated  . Frequency of Communication with Friends and Family: More than three times a week  . Frequency of Social Gatherings with Friends and Family: Once a week  .  Attends Religious Services: Never  . Active Member of Clubs or Organizations: Yes  . Attends Archivist Meetings: 1 to 4 times per year  . Marital Status: Divorced    Tobacco Counseling Ready to quit: Not Answered Counseling given: Not Answered Comment: smokes occasionally but not everyday   Clinical Intake:  Pre-visit preparation completed: Yes  Pain : No/denies pain     Nutritional Status: BMI > 30  Obese Nutritional Risks: None Diabetes: Yes CBG done?: No Did pt. bring in CBG monitor from home?: No (phone visit)  How often do you need to have someone help you when you read instructions, pamphlets, or other written materials from your doctor or pharmacy?: 1 - Never What is the last grade level you completed in school?: 2 yrs of college  Diabetes:  Is the patient diabetic?  Yes  If diabetic, was a CBG obtained today?  No  Did the patient bring in their glucometer from home?  No virtual visit How often do you monitor your CBG's? never.   Financial Strains and Diabetes Management:  Are you having any  financial strains with the device, your supplies or your medication? No .  Does the patient want to be seen by Chronic Care Management for management of their diabetes?  No  Would the patient like to be referred to a Nutritionist or for Diabetic Management?  No   Diabetic Exams:  Diabetic Eye Exam: Completed 06/2020. Requested patient have results sent to PCP  Diabetic Foot Exam: Pt has been advised about the importance in completing this exam. To be completed by PCP.     Interpreter Needed?: No  Information entered by :: Caroleen Hamman LPN   Activities of Daily Living In your present state of health, do you have any difficulty performing the following activities: 07/26/2020  Hearing? N  Vision? N  Difficulty concentrating or making decisions? N  Walking or climbing stairs? N  Dressing or bathing? N  Doing errands, shopping? N  Preparing Food and eating ? N  Using the Toilet? N  In the past six months, have you accidently leaked urine? Y  Comment occasionally  Do you have problems with loss of bowel control? N  Managing your Medications? N  Managing your Finances? N  Housekeeping or managing your Housekeeping? N  Some recent data might be hidden    Patient Care Team: [name], NP as PCP - General Kerin Salen, DDS as Consulting Physician ([place]) [place] as Consulting Physician (Ophthalmology) Lake Bells., MD as Consulting Physician (Gastroenterology) Brooks Sailors, MD as Consulting Physician (Surgery) Zebedee Iba., MD as Consulting Physician (Hematology and Oncology)  Indicate any recent Medical Services you may have received from other than [facility] providers in the past year (date may be approximate).     Assessment:   This is a routine wellness examination for [place].  Hearing/Vision screen  Hearing Screening   125Hz  250Hz  500Hz  1000Hz  2000Hz  3000Hz  4000Hz  6000Hz  8000Hz   Right ear:           Left ear:            Comments: No issues  Vision Screening Comments: Wears  glasses Last eye exam-06/2020-Dr. Bing Plume  Dietary issues and exercise activities discussed: Current Exercise Habits: Home exercise routine, Type of exercise: strength training/weights;yoga, Time (Minutes): 30, Frequency (Times/Week): 7, Weekly Exercise (Minutes/Week): 210, Intensity: Mild, Exercise limited by: orthopedic condition(s)  Goals    . Patient Stated  Continue drinking water, continue  current activity level & healthy eating      Depression Screen PHQ 2/9 Scores 07/26/2020 05/27/2019 05/20/2018 05/19/2017 03/03/2016 01/08/2015 02/10/2013  PHQ - 2 Score 0 0 0 0 0 1 0    Fall Risk Fall Risk  07/26/2020 05/27/2019 05/20/2018 07/30/2017 05/19/2017  Falls in the past year? 0 0 No No No  Comment - - - Emmi Telephone Survey: data to providers prior to load -  Number falls in past yr: 0 - - - -  Injury with Fall? 0 - - - -  Follow up Falls prevention discussed - - - -    Any stairs in or around the home? No  Home free of loose throw rugs in walkways, pet beds, electrical cords, etc? Yes  Adequate lighting in your home to reduce risk of falls? Yes   ASSISTIVE DEVICES UTILIZED TO PREVENT FALLS:  Life alert? No  Use of a cane, walker or w/c? No  Grab bars in the bathroom? No  Shower chair or bench in shower? No  Elevated toilet seat or a handicapped toilet? No   TIMED UP AND GO:  Was the test performed? No . Phone visit   Cognitive Function:No cognitive impairment noted        Immunizations Immunization History  Administered Date(s) Administered  . Fluad Quad(high Dose 65+) 04/29/2019, 05/11/2020  . Influenza Split 07/07/2011  . Influenza Whole 06/05/2009, 04/24/2010  . Influenza, High Dose Seasonal PF 06/29/2015, 04/30/2016, 05/19/2017, 05/24/2018  . Influenza,inj,Quad PF,6+ Mos 06/01/2013, 05/25/2014  . Moderna SARS-COVID-2 Vaccination 11/02/2019, 11/30/2019  . Pneumococcal Conjugate-13 06/01/2013  .  Pneumococcal Polysaccharide-23 10/01/2010, 09/12/2016  . Td 08/25/2002  . Tdap 02/14/2013    TDAP status: Up to date   Flu Vaccine status: Up to date   Pneumococcal vaccine status: Up to date   Covid-19 vaccine status: Completed vaccines  Qualifies for Shingles Vaccine? Yes   Zostavax completed No   Shingrix Completed?: No.    Education has been provided regarding the importance of this vaccine. Patient has been advised to call insurance company to determine out of pocket expense if they have not yet received this vaccine. Advised may also receive vaccine at local pharmacy or Health Dept. Verbalized acceptance and understanding.  Screening Tests Health Maintenance  Topic Date Due  . OPHTHALMOLOGY EXAM  12/23/2016  . FOOT EXAM  05/25/2019  . HEMOGLOBIN A1C  10/14/2020  . URINE MICROALBUMIN  04/13/2021  . MAMMOGRAM  04/12/2022  . TETANUS/TDAP  02/15/2023  . COLONOSCOPY  01/01/2025  . INFLUENZA VACCINE  Completed  . DEXA SCAN  Completed  . COVID-19 Vaccine  Completed  . Hepatitis C Screening  Completed  . PNA vac Low Risk Adult  Completed    Health Maintenance  Health Maintenance Due  Topic Date Due  . OPHTHALMOLOGY EXAM  12/23/2016  . FOOT EXAM  05/25/2019    Colorectal cancer screening: Completed Colonoscopy 01/02/2015. Repeat every 10 years   Mammogram status: Completed Bilateral 04/12/2020. Repeat every year   Bone Density status: Ordered today. Pt provided with contact info and advised to call to schedule appt.  Lung Cancer Screening: (Low Dose CT Chest recommended if Age 69-80 years, 30 pack-year currently smoking OR have quit w/in 15years.) does not qualify.     Additional Screening:  Hepatitis C Screening:Completed 03/07/2016  Vision Screening: Recommended annual ophthalmology exams for early detection of glaucoma and other disorders of the eye. Is the patient up to date with their  annual eye exam?  Yes  Who is the provider or what is the name of the office  in which the patient attends annual eye exams? Dr. Bing Plume   Dental Screening: Recommended annual dental exams for proper oral hygiene  Community Resource Referral / Chronic Care Management: CRR required this visit?  No   CCM required this visit?  No      Plan:     I have personally reviewed and noted the following in the patient's chart:   . Medical and social history . Use of alcohol, tobacco or illicit drugs  . Current medications and supplements . Functional ability and status . Nutritional status . Physical activity . Advanced directives . List of other physicians . Hospitalizations, surgeries, and ER visits in previous 12 months . Vitals . Screenings to include cognitive, depression, and falls . Referrals and appointments  In addition, I have reviewed and discussed with patient certain preventive protocols, quality metrics, and best practice recommendations. A written personalized care plan for preventive services as well as general preventive health recommendations were provided to patient.   Due to this being a telephone visit, the after visit summary with patients personalized plan was offered to patient via mail or my-chart. Patient would like to access on my-chart.   Marta Antu, LPN   [id number]/02/[id number]  Nurse Health Advisor  Nurse Notes: None

## 2020-07-27 ENCOUNTER — Telehealth (INDEPENDENT_AMBULATORY_CARE_PROVIDER_SITE_OTHER): Payer: Medicare Other | Admitting: Family

## 2020-07-27 ENCOUNTER — Other Ambulatory Visit: Payer: Self-pay

## 2020-07-27 ENCOUNTER — Telehealth: Payer: Self-pay | Admitting: Family

## 2020-07-27 DIAGNOSIS — E119 Type 2 diabetes mellitus without complications: Secondary | ICD-10-CM | POA: Diagnosis not present

## 2020-07-27 DIAGNOSIS — E538 Deficiency of other specified B group vitamins: Secondary | ICD-10-CM

## 2020-07-27 DIAGNOSIS — E039 Hypothyroidism, unspecified: Secondary | ICD-10-CM | POA: Diagnosis not present

## 2020-07-27 NOTE — Telephone Encounter (Signed)
Can you please add a lab visit at the time of her next b12?

## 2020-07-27 NOTE — Telephone Encounter (Signed)
Scheduled for 11 am lab

## 2020-07-27 NOTE — Telephone Encounter (Signed)
Patient made aware she needs to be here at 11 am for labs prior to nv

## 2020-07-27 NOTE — Progress Notes (Signed)
Virtual Visit via Video Note  I connected with Sheryl [name] on 07/27/20 at 11:00 AM EST by a video enabled telemedicine application and verified that I am speaking with the correct person using two identifiers.  Location: Patient: home Provider: home   I discussed the limitations of evaluation and management by telemedicine and the availability of in person appointments. The patient expressed understanding and agreed to proceed. Only the patient and myself were present for today's video call.   History of Present Illness:  Oxygen 97, HR 80  Temp 97.5, bp 141/88 then 132/80. Weight 173 lbs  HTN- she completed the 30 day course of amlodipine and then she discontinued.   BP Readings from Last 3 Encounters:  04/27/20 (!) 144/72  04/13/20 (!) 175/95  11/01/19 124/80   She has not had her COVID-19 booster yet.  She made an appointment to receive her booster.    b12 deficiency- she is doing monthly injections.  Reports improvement in her energy  RA- reports that joint pain has been mild.  She has declined treatment with biologics in the past.   Hyperlipidemia- not on statin.   Lab Results  Component Value Date   CHOL 173 04/27/2020   HDL 26 (L) 04/27/2020   LDLCALC 116 (H) 04/27/2020   LDLDIRECT 129.0 12/04/2017   TRIG 193 (H) 04/27/2020   CHOLHDL 6.7 (H) 04/27/2020   Asthma- was bad for a while, but currently stable. She continues singulair daily, prn albuterol, Trelegy Ellipta.  She is followed by pulmonology.   Lab Results  Component Value Date   HGBA1C 6.1 04/13/2020     Observations/Objective:   Gen: Awake, alert, no acute distress Resp: Breathing is even and non-labored Psych: calm/pleasant demeanor Neuro: Alert and Oriented x 3, + facial symmetry, speech is clear.   Assessment and Plan:  HTN-  I advised the pt to check blood pressure once daily for 1 week. I want to look at the trend and will decide if she needs to resume amlodipine.  b12 deficiency-  stable. Continue b12 injections.   RA- clinically stable. Monitor.  Hyperlipidemia- trigs a bit high, LDL OK. She is allergic to statins.  DM2- not on medication. Obtain A1C, bmet.    Hypothyroid- continues synthroid.  Lab Results  Component Value Date   TSH 0.69 04/13/2020     Asthma- stable. Management per pulmonology.   Follow Up Instructions:    I discussed the assessment and treatment plan with the patient. The patient was provided an opportunity to ask questions and all were answered. The patient agreed with the plan and demonstrated an understanding of the instructions.   The patient was advised to call back or seek an in-person evaluation if the symptoms worsen or if the condition fails to improve as anticipated.  [name], NP

## 2020-08-02 DIAGNOSIS — Z23 Encounter for immunization: Secondary | ICD-10-CM | POA: Diagnosis not present

## 2020-08-05 ENCOUNTER — Encounter: Payer: Self-pay | Admitting: Family

## 2020-08-06 MED ORDER — LISINOPRIL 10 MG PO TABS: 10.0000 mg | ORAL_TABLET | Freq: Every day | ORAL | 1 refills | Status: DC

## 2020-08-06 NOTE — Telephone Encounter (Signed)
Patient providing home BP's per your request at last office visit on 07/27/20. "HTN-  I advised the pt to check blood pressure once daily for 1 week. I want to look at the trend and will decide if she needs to resume amlodipine." See MyChart message from patient w/ readings.

## 2020-08-10 ENCOUNTER — Other Ambulatory Visit: Payer: Self-pay

## 2020-08-10 ENCOUNTER — Ambulatory Visit (INDEPENDENT_AMBULATORY_CARE_PROVIDER_SITE_OTHER): Payer: Medicare Other

## 2020-08-10 ENCOUNTER — Other Ambulatory Visit (INDEPENDENT_AMBULATORY_CARE_PROVIDER_SITE_OTHER): Payer: Medicare Other

## 2020-08-10 DIAGNOSIS — E538 Deficiency of other specified B group vitamins: Secondary | ICD-10-CM

## 2020-08-10 DIAGNOSIS — E119 Type 2 diabetes mellitus without complications: Secondary | ICD-10-CM | POA: Diagnosis not present

## 2020-08-10 DIAGNOSIS — E039 Hypothyroidism, unspecified: Secondary | ICD-10-CM | POA: Diagnosis not present

## 2020-08-10 LAB — BASIC METABOLIC PANEL
BUN: 18 mg/dL (ref 6–23)
CO2: 28 mEq/L (ref 19–32)
Calcium: 8.9 mg/dL (ref 8.4–10.5)
Chloride: 101 mEq/L (ref 96–112)
Creatinine, Ser: 0.81 mg/dL (ref 0.40–1.20)
GFR: 72.15 mL/min (ref >60.00)
Glucose, Bld: 91 mg/dL (ref 70–99)
Potassium: 4.5 mEq/L (ref 3.5–5.1)
Sodium: 135 mEq/L (ref 135–145)

## 2020-08-10 LAB — TSH: TSH: 2.03 u[IU]/mL (ref 0.35–4.50)

## 2020-08-10 LAB — VITAMIN B12: Vitamin B-12: 425 pg/mL (ref 211–911)

## 2020-08-10 LAB — HEMOGLOBIN A1C: Hgb A1c MFr Bld: 6.2 % (ref 4.6–6.5)

## 2020-08-10 MED ORDER — CYANOCOBALAMIN 1000 MCG/ML IJ SOLN
1000.0000 ug | Freq: Once | INTRAMUSCULAR | Status: AC
  Administered 2020-08-10: 11:00:00 1000 ug via INTRAMUSCULAR

## 2020-08-10 NOTE — Progress Notes (Signed)
Pt here today for B12 injection.   Cyanocobalamin 1047mcg injected into R deltoid. Pt tolerated injection well.   Next in 4 weeks.

## 2020-08-14 ENCOUNTER — Telehealth: Payer: Self-pay

## 2020-08-14 ENCOUNTER — Emergency Department (HOSPITAL_BASED_OUTPATIENT_CLINIC_OR_DEPARTMENT_OTHER): Payer: Medicare Other

## 2020-08-14 ENCOUNTER — Other Ambulatory Visit (HOSPITAL_BASED_OUTPATIENT_CLINIC_OR_DEPARTMENT_OTHER): Payer: Self-pay | Admitting: Medical

## 2020-08-14 ENCOUNTER — Other Ambulatory Visit: Payer: Self-pay

## 2020-08-14 ENCOUNTER — Emergency Department (HOSPITAL_BASED_OUTPATIENT_CLINIC_OR_DEPARTMENT_OTHER)
Admission: EM | Admit: 2020-08-14 | Discharge: 2020-08-14 | Disposition: A | Discharge status: Home / Self Care | Payer: Medicare Other | Attending: Emergency Medicine | Admitting: Emergency Medicine

## 2020-08-14 ENCOUNTER — Encounter (HOSPITAL_BASED_OUTPATIENT_CLINIC_OR_DEPARTMENT_OTHER): Payer: Self-pay | Admitting: Emergency Medicine

## 2020-08-14 DIAGNOSIS — I10 Essential (primary) hypertension: Secondary | ICD-10-CM | POA: Diagnosis not present

## 2020-08-14 DIAGNOSIS — E119 Type 2 diabetes mellitus without complications: Secondary | ICD-10-CM | POA: Insufficient documentation

## 2020-08-14 DIAGNOSIS — J3489 Other specified disorders of nose and nasal sinuses: Secondary | ICD-10-CM | POA: Diagnosis not present

## 2020-08-14 DIAGNOSIS — K1370 Unspecified lesions of oral mucosa: Secondary | ICD-10-CM | POA: Diagnosis not present

## 2020-08-14 DIAGNOSIS — E039 Hypothyroidism, unspecified: Secondary | ICD-10-CM | POA: Diagnosis not present

## 2020-08-14 DIAGNOSIS — Z85038 Personal history of other malignant neoplasm of large intestine: Secondary | ICD-10-CM | POA: Insufficient documentation

## 2020-08-14 DIAGNOSIS — R220 Localized swelling, mass and lump, head: Secondary | ICD-10-CM | POA: Diagnosis present

## 2020-08-14 DIAGNOSIS — Z79899 Other long term (current) drug therapy: Secondary | ICD-10-CM | POA: Diagnosis not present

## 2020-08-14 DIAGNOSIS — J45909 Unspecified asthma, uncomplicated: Secondary | ICD-10-CM | POA: Insufficient documentation

## 2020-08-14 DIAGNOSIS — K1379 Other lesions of oral mucosa: Secondary | ICD-10-CM | POA: Insufficient documentation

## 2020-08-14 DIAGNOSIS — F17210 Nicotine dependence, cigarettes, uncomplicated: Secondary | ICD-10-CM | POA: Diagnosis not present

## 2020-08-14 DIAGNOSIS — L03211 Cellulitis of face: Secondary | ICD-10-CM | POA: Insufficient documentation

## 2020-08-14 DIAGNOSIS — J432 Centrilobular emphysema: Secondary | ICD-10-CM | POA: Diagnosis not present

## 2020-08-14 DIAGNOSIS — J392 Other diseases of pharynx: Secondary | ICD-10-CM | POA: Diagnosis not present

## 2020-08-14 DIAGNOSIS — R030 Elevated blood-pressure reading, without diagnosis of hypertension: Secondary | ICD-10-CM

## 2020-08-14 DIAGNOSIS — Z7982 Long term (current) use of aspirin: Secondary | ICD-10-CM | POA: Insufficient documentation

## 2020-08-14 HISTORY — DX: Essential (primary) hypertension: I10

## 2020-08-14 LAB — BASIC METABOLIC PANEL WITH GFR
Anion gap: 8 (ref 5–15)
BUN: 15 mg/dL (ref 8–23)
CO2: 21 mmol/L — ABNORMAL LOW (ref 22–32)
Calcium: 8.4 mg/dL — ABNORMAL LOW (ref 8.9–10.3)
Chloride: 104 mmol/L (ref 98–111)
Creatinine, Ser: 0.79 mg/dL (ref 0.44–1.00)
GFR, Estimated: >60 mL/min (ref >60)
Glucose, Bld: 87 mg/dL (ref 70–99)
Potassium: 4 mmol/L (ref 3.5–5.1)
Sodium: 133 mmol/L — ABNORMAL LOW (ref 135–145)

## 2020-08-14 LAB — CBC WITH DIFFERENTIAL/PLATELET
Abs Immature Granulocytes: 0.01 K/uL (ref 0.00–0.07)
Basophils Absolute: 0 K/uL (ref 0.0–0.1)
Basophils Relative: 1 %
Eosinophils Absolute: 0.2 K/uL (ref 0.0–0.5)
Eosinophils Relative: 3 %
HCT: 45.5 % (ref 36.0–46.0)
Hemoglobin: 15.5 g/dL — ABNORMAL HIGH (ref 12.0–15.0)
Immature Granulocytes: 0 %
Lymphocytes Relative: 25 %
Lymphs Abs: 1.6 K/uL (ref 0.7–4.0)
MCH: 31.6 pg (ref 26.0–34.0)
MCHC: 34.1 g/dL (ref 30.0–36.0)
MCV: 92.7 fL (ref 80.0–100.0)
Monocytes Absolute: 0.3 K/uL (ref 0.1–1.0)
Monocytes Relative: 5 %
Neutro Abs: 4 K/uL (ref 1.7–7.7)
Neutrophils Relative %: 66 %
Platelets: 151 K/uL (ref 150–400)
RBC: 4.91 MIL/uL (ref 3.87–5.11)
RDW: 13.5 % (ref 11.5–15.5)
WBC: 6.2 K/uL (ref 4.0–10.5)
nRBC: 0 % (ref 0.0–0.2)

## 2020-08-14 MED ORDER — SODIUM CHLORIDE 0.9 % IV SOLN
INTRAVENOUS | Status: DC | PRN
  Administered 2020-08-14: 16:00:00 500 mL via INTRAVENOUS

## 2020-08-14 MED ORDER — IOHEXOL 300 MG/ML  SOLN
100.0000 mL | Freq: Once | INTRAMUSCULAR | Status: AC
  Administered 2020-08-14: 14:00:00 75 mL via INTRAVENOUS

## 2020-08-14 MED ORDER — CLINDAMYCIN HCL 150 MG PO CAPS: 450.0000 mg | ORAL_CAPSULE | Freq: Three times a day (TID) | ORAL | 0 refills | Status: DC

## 2020-08-14 MED ORDER — CLINDAMYCIN PHOSPHATE 600 MG/50ML IV SOLN
600.0000 mg | Freq: Once | INTRAVENOUS | Status: AC
  Administered 2020-08-14: 16:00:00 600 mg via INTRAVENOUS
  Filled 2020-08-14: qty 50

## 2020-08-14 MED ORDER — SODIUM CHLORIDE 0.9 % IV BOLUS: 250.0000 mL | Freq: Once | INTRAVENOUS | Status: DC

## 2020-08-14 MED FILL — CLINDAMYCIN HCL 150 MG CAPS: 150 | 7 days supply | Qty: 63 | Fill #0

## 2020-08-14 NOTE — ED Notes (Signed)
Patient transported to CT 

## 2020-08-14 NOTE — ED Triage Notes (Signed)
Pt arrives pov, ambulatory to room with c/o left side facial swelling. Pt endorses new script for lisinopril on 12/13. Pt c/o itching and tenderness. Denies difficulty breathing, denies throat swelling

## 2020-08-14 NOTE — Telephone Encounter (Signed)
Caller woke up with the left side of her face itching and swollen near her mouth. Doesn't see any bite. Icing it. Doesn't have tooth pain/jaw pain/dental issues. Does have RA and another syndrome. She has had a dry mouth which has seemed worse since turning heater on. Audibly coughs. Did just recover from stomach virus. Got blood tests and B12 shot. Then diarrhea and vomiting for days. Currently much better and urinating normally.   Disp. Time Eilene Ghazi Time) Disposition Final User 08/14/2020 10:40:56 AM Attempt made - message left Clovis Riley RNGeorgina Peer 08/14/2020 10:54:57 AM Attempt made - message left Clovis Riley, RN, Georgina Peer 08/14/2020 11:03:35 AM FINAL ATTEMPT MADE - message left Yes Clovis Riley, RN, Georgina Peer

## 2020-08-14 NOTE — ED Notes (Signed)
ED Provider at bedside. 

## 2020-08-14 NOTE — Discharge Instructions (Addendum)
At this time there does not appear to be the presence of an emergent medical condition, however there is always the potential for conditions to change. Please read and follow the below instructions.  Please return to the Emergency Department immediately for any new or worsening symptoms or if your symptoms do not improve within 3 days. Please be sure to follow up with your Primary Care Provider within one week regarding your visit today; please call their office to schedule an appointment even if you are feeling better for a follow-up visit. Please take your antibiotic Clindamycin as prescribed until complete to help with your symptoms.  Please drink enough water to avoid dehydration and get plenty of rest. Your CT scan today showed a lesion near your palate.  This will need to be evaluated by ear nose and throat specialist to see if there is a benign growth or a malignancy.  Please call [facility] ear nose and throat office today to schedule a follow-up appointment for reevaluation. Please stop taking lisinopril as this may cause facial swelling. Call your primary care provider today to inform them of your medication changes and for recheck of your blood pressure and medication management.  Go to the nearest Emergency Department immediately if: You have fever or chills You feel very sleepy. You throw up (vomit) or have watery poop (diarrhea) for a long time. You see red streaks coming from the area. Your red area gets larger. Your red area turns dark in color. Get a very bad headache. Start to feel mixed up (confused). Feel weak or numb. Feel faint. Have very bad pain in your: Chest. Belly (abdomen). Throw up more than once. Have trouble breathing. You have any new/concerning or worsening of symptoms.   Please read the additional information packets attached to your discharge summary.  Do not take your medicine if  develop an itchy rash, swelling in your mouth or lips, or difficulty  breathing; call 911 and seek immediate emergency medical attention if this occurs.  You may review your lab tests and imaging results in their entirety on your MyChart account.  Please discuss all results of fully with your primary care provider and other specialist at your follow-up visit.  Note: Portions of this text may have been transcribed using voice recognition software. Every effort was made to ensure accuracy; however, inadvertent computerized transcription errors may still be present.

## 2020-08-14 NOTE — ED Notes (Signed)
Pt requesting to hold on [place] for now, unless needed.

## 2020-08-14 NOTE — ED Provider Notes (Signed)
[facility] EMERGENCY DEPARTMENT Provider Note   CSN: [id number] Arrival date & time: 08/14/20  1226     History Chief Complaint  Patient presents with  . Facial Swelling    Sheryl [name] is a 74 y.o. female history of aortic astasia, diabetes, hypertension, hyperlipidemia, Sjogren's syndrome, SBO, pericarditis.  Patient arrives today for evaluation of left-sided facial swelling.  Onset when patient woke up this morning, she did not notice her face was swollen until she looked in the mirror she describes swelling of the lower left half of her face with some associated erythema, she reports a mild itching tenderness to the area constant nonradiating no aggravating or alleviating factors, no radiation of symptoms.  She denies similar symptoms in the past.  She reports she has had 3 total doses of a newly prescribed lisinopril this week.  She also reports that she has extensive dental history and implants in place but does not feel she is having any dental pain today.  Denies fever/chills, headache, vision changes, neck stiffness, sore throat, difficulty swallowing, voice change, drooling, chest pain/shortness of breath, abdominal pain, nausea/vomiting or any additional concerns.  HPI     Past Medical History:  Diagnosis Date  . Anemia    pernicious  . Aortic ectasia (HCC)    infrarenal abdomina aorta 2.6 cm  . Asthma   . Colon cancer ([place]) 2012  . Diabetes mellitus    type 2  . Fatty liver 01/19/2015  . 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 History of pericarditis 07/26/2012  . Hyperlipidemia   . Hypertension   . Pericarditis 07/26/2012  . Rheumatoid arthritis(714.0)   . Sjogren's syndrome ([place]) 03/17/2011  . Small bowel obstruction ([place]) 03/2014   ?due to adhesions from colon surgery per pt  . Thyroid disease    hypothyroid  . Tobacco abuse     Patient Active Problem List   Diagnosis Date Noted  . Calculus of gallbladder  09/13/2018  . Ventral hernia 09/13/2018  . Atherosclerotic vascular disease 09/13/2018  . Overweight 09/13/2018  . Mixed dyslipidemia 09/13/2018  . Centrilobular emphysema ([place]) 01/29/2017  . 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 Anemia 06/04/2016  . Ectatic aorta ([place]) 03/03/2016  . Plantar fasciitis, bilateral 08/03/2015  . Plantar fasciitis 08/03/2015  . Fatty liver 01/19/2015  . Vaginal atrophy 01/08/2015  . SBO (small bowel obstruction) ([place]) 05/02/2014  . Colon cancer ([place]) 05/26/2012  . Psoriasis 12/29/2011  . Sjogren's syndrome ([place]) 03/17/2011  . General medical examination 12/25/2010  . Insomnia 12/25/2010  . General medical examination 12/25/2010  . SKIN LESION 09/17/2010  . UNSPECIFIED VITAMIN D DEFICIENCY 05/15/2010  . Asthma 12/26/2009  . Diabetes type 2, controlled ([place]) 09/10/2009  . Depression 09/10/2009  . Allergic rhinitis 09/10/2009  . ELEVATED BLOOD PRESSURE WITHOUT DIAGNOSIS OF HYPERTENSION 09/10/2009  . Hypothyroidism 06/05/2009  . Hyperlipidemia 06/05/2009  . PERNICIOUS ANEMIA 06/05/2009  . History of tobacco abuse 06/05/2009  . GERD 06/05/2009  . Rheumatoid arthritis ([place]) 06/05/2009    Past Surgical History:  Procedure Laterality Date  . CHOLECYSTECTOMY  10/23/2016  . COLON SURGERY  8/.23/13   tumor removed from sigmoid colon.  Marland Kitchen DILATION AND CURETTAGE OF UTERUS  1975  . HERNIA REPAIR  10/23/2016  . KNEE SURGERY  2006   arthroscopic left knee  . KNEE SURGERY  2001   right knee     OB History   No obstetric history on file.     Family History  Problem Relation Age of Onset  . Heart disease Other        CAD  . Diabetes Other   . Hyperlipidemia Other   . Stroke Other     Social History   Tobacco Use  . Smoking status: Current Some Day Smoker    Types: Cigarettes  . Smokeless tobacco: Never Used  . Tobacco comment: smokes occasionally but not everyday  Substance Use Topics  . Alcohol use: Yes    Alcohol/week: 1.0 standard  drink    Types: 1 Glasses of wine per week    Comment: occasionally  . Drug use: Yes    Comment: Smokes "medical grade" marijuana 2 puffs 3 x weekly for rheumatoid arthritis.per pt    Home Medications Prior to Admission medications   Medication Sig Start Date End Date Taking? Authorizing Provider  albuterol (VENTOLIN HFA) 108 (90 Base) MCG/ACT inhaler Inhale 2 puffs into the lungs every 6 (six) hours as needed for wheezing or shortness of breath. 12/08/18   [name], NP  amLODipine (NORVASC) 2.5 MG tablet Take 1 tablet (2.5 mg total) by mouth daily. 04/13/20   [name], NP  aspirin 81 MG tablet Take 81 mg by mouth daily.      [provider]  betamethasone dipropionate 0.05 % cream Apply topically 2 (two) times daily. 11/01/19   [name], NP  clindamycin (CLEOCIN) 150 MG capsule Take 3 capsules (450 mg total) by mouth 3 (three) times daily for 7 days. 08/14/20 08/21/20  Nuala Alpha A, PA-C  estradiol (ESTRACE VAGINAL) 0.1 MG/GM vaginal cream 2g daily intravaginally for 1 week, then reduce to 1 gram PV daily for 1 week , followed by a maintenance dose of 1 g PV once weekly 05/19/17   [name], NP  fluticasone (FLONASE) 50 MCG/ACT nasal spray Place 2 sprays into both nostrils daily as needed. 01/27/19   [name], NP  levothyroxine (SYNTHROID) 100 MCG tablet TAKE 1 TABLET DAILY 06/07/20   [name], NP  montelukast (SINGULAIR) 10 MG tablet TAKE 1 TABLET AT BEDTIME 06/07/20   [name], NP  multivitamin [facility]) per tablet Take 1 tablet by mouth daily.      [provider]  NON FORMULARY TUMERIC MILK.    [provider]  NON FORMULARY CBD. Derivative of marijuana without the marijuana.    [provider]  nystatin (MYCOSTATIN/NYSTOP) powder Apply topically 4 (four) times daily. 04/29/19   [name], NP  pantoprazole (PROTONIX) 40 MG tablet Take 1 tablet (40 mg total) by mouth  daily. 04/13/20   [name], NP  Probiotic Product (PROBIOTIC DAILY PO) Take 1 capsule by mouth daily.    [provider]  traZODone (DESYREL) 50 MG tablet 1/2 to 1 tab by mouth at bedtime as needed for insomnia 04/13/20   [name], NP  TRELEGY ELLIPTA 100-62.5-25 MCG/INH AEPB INHALE 1 PUFF BY MOUTH INTO THE LUNGS DAILY. 04/25/20   Rigoberto Noel, MD  VOLTAREN 1 % GEL APPLY 2 GRAMS TOPICALLY 4 TIMES AS NEEDED 12/09/17   [name], NP  lisinopril (ZESTRIL) 10 MG tablet Take 1 tablet (10 mg total) by mouth daily. 08/06/20 08/14/20  [name], NP    Allergies    Levofloxacin, Mold extract [trichophyton], Ativan [lorazepam], Cefuroxime axetil, Hydrocodone-homatropine, Rosuvastatin calcium, Statins, Zetia [ezetimibe], and Hydrocodone-acetaminophen  Review of Systems   Review of Systems Ten systems are reviewed and are negative for acute change except as noted in the HPI  Physical Exam Updated  Vital Signs BP (!) 165/82   Pulse 73   Temp 98 F (36.7 C) (Oral)   Resp 16   Ht 5\' 2"  (1.575 m)   Wt 77.1 kg   SpO2 99%   BMI 31.09 kg/m   Physical Exam Constitutional:      General: She is not in acute distress.    Appearance: Normal appearance. She is well-developed. She is not ill-appearing or diaphoretic.  HENT:     Head: Normocephalic and atraumatic.     Jaw: There is normal jaw occlusion.      Comments: Moderate swelling and mild erythema of the left cheek along left jaw.  No fluctuance or induration.    Right Ear: Tympanic membrane and external ear normal.     Left Ear: Tympanic membrane and external ear normal.     Nose: Nose normal.     Mouth/Throat:     Comments: The patient has normal phonation and is in control of secretions. No stridor.  Midline uvula without edema. Soft palate rises symmetrically. No tonsillar erythema, swelling or exudates. Tongue protrusion is normal, floor of mouth is soft. No trismus. No creptius on neck  palpation. No gingival erythema or fluctuance noted. Mucus membranes moist.  Poor dentition overall.  Patient's dental prosthesis were removed.  No evidence of acute infectious intraoral process. Eyes:     General: Vision grossly intact. Gaze aligned appropriately.     Extraocular Movements: Extraocular movements intact.     Conjunctiva/sclera: Conjunctivae normal.     Pupils: Pupils are equal, round, and reactive to light.     Comments: No pain with extraocular motion  Neck:     Trachea: Trachea and phonation normal. No tracheal tenderness or tracheal deviation.  Pulmonary:     Effort: Pulmonary effort is normal. No respiratory distress.  Abdominal:     General: There is no distension.     Palpations: Abdomen is soft.     Tenderness: There is no abdominal tenderness. There is no guarding or rebound.  Musculoskeletal:        General: Normal range of motion.     Cervical back: Normal range of motion and neck supple. No rigidity.  Skin:    General: Skin is warm and dry.  Neurological:     Mental Status: She is alert.     GCS: GCS eye subscore is 4. GCS verbal subscore is 5. GCS motor subscore is 6.     Comments: Speech is clear and goal oriented, follows commands Major Cranial nerves without deficit, no facial droop Moves extremities without ataxia, coordination intact  Psychiatric:        Behavior: Behavior normal.     ED Results / Procedures / Treatments   Labs (all labs ordered are listed, but only abnormal results are displayed) Labs Reviewed  CBC WITH DIFFERENTIAL/PLATELET - Abnormal; Notable for the following components:      Result Value   Hemoglobin 15.5 (*)    All other components within normal limits  BASIC METABOLIC PANEL - Abnormal; Notable for the following components:   Sodium 133 (*)    CO2 21 (*)    Calcium 8.4 (*)    All other components within normal limits    EKG None  Radiology CT Soft Tissue Neck W Contrast  Result Date: 08/14/2020 CLINICAL  DATA:  Left-sided facial swelling EXAM: CT NECK WITH CONTRAST TECHNIQUE: Multidetector CT imaging of the neck was performed using the standard protocol following the bolus administration of intravenous contrast.  CONTRAST:  75mL OMNIPAQUE IOHEXOL 300 MG/ML  SOLN COMPARISON:  None. FINDINGS: Pharynx and larynx: Unremarkable.  No mass or swelling. Salivary glands: Unremarkable. Thyroid: Not well seen and likely small in size. Lymph nodes: No enlarged or abnormal density nodes. Vascular: Major neck vessels are patent. Atherosclerotic plaque at the ICA origins. Limited intracranial: No abnormal enhancement. Visualized orbits: Unremarkable. Mastoids and visualized paranasal sinuses: Minor paranasal sinus mucosal thickening. Mastoids are clear. Skeleton: Degenerative changes of the cervical spine. Upper chest: Nonspecific nonenlarged mediastinal lymph nodes. Centrilobular and paraseptal emphysema. Other: Soft tissue swelling of the lower left face at the level of the maxilla and mandible with infiltration of subcutaneous fat extending to the buccal surface. No evidence of abscess. There is a 0.8 x 1.4 x 1 cm homogeneous, likely enhancing lesion within the right parapharyngeal fat at the level of the palate (series 3, image 37). IMPRESSION: Left lower facial cellulitis without evidence of abscess. Approximately 1 cm lesion within the right parapharyngeal fat at the level of palate. May reflect a nerve sheath tumor, minor/ectopic salivary gland tumor, or deep lobe parotid tumor. Most likely benign but malignancy is not excluded by this study. Outpatient ENT follow-up is recommended. Electronically Signed   By: Macy Mis M.D.   On: 08/14/2020 14:45    Procedures Procedures (including critical care time)  Medications Ordered in ED Medications  0.9 %  sodium chloride infusion (500 mLs Intravenous New Bag/Given 08/14/20 1531)  iohexol (OMNIPAQUE) 300 MG/ML solution 100 mL (75 mLs Intravenous Contrast Given 08/14/20  1412)  clindamycin (CLEOCIN) IVPB 600 mg ( Intravenous Stopped 08/14/20 1615)    ED Course  I have reviewed the triage vital signs and the nursing notes.  Pertinent labs & imaging results that were available during my care of the patient were reviewed by me and considered in my medical decision making (see chart for details).    MDM Rules/Calculators/A&P                         Additional history obtained from: 1. Nursing notes from this visit. 2. Review of electronic medical records, no pertinent recent ER visits. -------------- 74 year old female presented with left lower facial swelling that began this morning some associated erythema of the area as well.  Minimally tender.  She recently started taking lisinopril.  She has poor dentition and multiple implants in place but no evidence of acute intraoral infectious process.  Patient seen and evaluated by Dr. Laverta Baltimore, plan of care is to obtain CT soft tissue neck for further evaluation. - I reviewed and interpreted labs which include: CBC without leukocytosis, no anemia. BMP shows no emergent electrolyte derangement, AKI or gap.  CT Soft Tissue Neck:  IMPRESSION:  Left lower facial cellulitis without evidence of abscess.    Approximately 1 cm lesion within the right parapharyngeal fat at the  level of palate. May reflect a nerve sheath tumor, minor/ectopic  salivary gland tumor, or deep lobe parotid tumor. Most likely benign  but malignancy is not excluded by this study. Outpatient ENT  follow-up is recommended.   Patient given 1 dose IV clindamycin for treatment of facial cellulitis.  She will be discharged with continuation of clindamycin 450 mg p.o. 3 times daily x7 days.  She is well-appearing no acute distress, does not meet SIRS/sepsis criteria no indication for inpatient treatment of facial cellulitis.  Additionally will have patient stop taking lisinopril as possible mild angioedema could have kick started the cellulitis  today?   Patient advised to stop taking lisinopril immediately and call her PCP for reevaluation and medication management.  Her airway is intact no evidence of compromise.  Vital signs stable on room air.  Patient updated on findings above she is agreeable to plan and is requesting discharge.  Patient was also informed of incidental palatal finding on CT scan she was given referral to [facility]NT and I encouraged the patient to call today to schedule follow-up appointment for further evaluation.  Incidentally patient noted to be hypertensive in the emergency department today she is asymptomatic regarding elevated blood pressure reading.  Patient encouraged to call PCP to schedule follow-up appointment for blood pressure recheck and medication management.  Signs or symptoms of hypertensive urgency/emergency given patient she is to return to the ER if they occur.  At this time there does not appear to be any evidence of an acute emergency medical condition and the patient appears stable for discharge with appropriate outpatient follow up. Diagnosis was discussed with patient who verbalizes understanding of care plan and is agreeable to discharge. I have discussed return precautions with patient who verbalizes understanding. Patient encouraged to follow-up with their PCP and ENT. All questions answered.  Patient' seen and evaluated by Dr. Laverta Baltimore who agrees with plan to discharge with clindamycin and ENT follow-up and cessation of lisinopril.   Note: Portions of this report may have been transcribed using voice recognition software. Every effort was made to ensure accuracy; however, inadvertent computerized transcription errors may still be present. Final Clinical Impression(s) / ED Diagnoses Final diagnoses:  Facial cellulitis  Palatal mass  Elevated blood pressure reading    Rx / DC Orders ED Discharge Orders         Ordered    clindamycin (CLEOCIN) 150 MG capsule  3 times daily        08/14/20 [address] 08/14/20 1657    Margette Fast, MD 08/20/20 [id number]

## 2020-08-14 NOTE — Telephone Encounter (Signed)
Nurse Assessment Nurse: Alinda Money, RN, Sarah Date/Time Eilene Ghazi Time): 08/14/2020 11:57:49 AM Confirm and document reason for call. If symptomatic, describe symptoms. ---Caller states she woke up with some swelling in her face. States it seems like she got bitten by something. States she has taken Benadryl and has been using ice. No toothache. Swelling is in lower part of her face. Does the patient have any new or worsening symptoms? ---Yes Will a triage be completed? ---Yes Related visit to physician within the last 2 weeks? ---No Does the PT have any chronic conditions? (i.e. diabetes, asthma, this includes High risk factors for pregnancy, etc.) ---Yes List chronic conditions. ---Sjorgen's syndrome. Is this a behavioral health or substance abuse call? ---No Guidelines Guideline Title Affirmed Question Affirmed Notes Nurse Date/Time Eilene Ghazi Time) Face Swelling Taking an ACE Inhibitor medication (e.g., benazepril/LOTENSIN, captopril/CAPOTEN, enalapril/VASOTEC, lisinopril/ZESTRIL) Goins, RN, Judson Roch 08/14/2020 12:01:16 PM PLEASE NOTE: All timestamps contained within this report are represented as [place] Standard Time. CONFIDENTIALTY NOTICE: This fax transmission is intended only for the addressee. It contains information that is legally privileged, confidential or otherwise protected from use or disclosure. If you are not the intended recipient, you are strictly prohibited from reviewing, disclosing, copying using or disseminating any of this information or taking any action in reliance on or regarding this information. If you have received this fax in error, please notify us immediately by telephone so that we can arrange for its return to [place]. Phone: [phone], Toll-Free: [phone], Fax: [phone] Page: 2 of 2 Call Id: [id number] [place]. Time Eilene Ghazi Time) Disposition Final User 08/14/2020 12:06:02 PM Go to ED Now (or PCP triage) Yes Goins, RN, [place] Disagree/Comply  Comply Caller Understands Yes PreDisposition Call Doctor Care Advice Given Per Guideline GO TO ED NOW (OR PCP TRIAGE): * IF NO PCP (PRIMARY CARE PROVIDER) SECOND-LEVEL TRIAGE: You need to be seen within the next hour. Go to the [facility] at _____________ [place] as soon as you can. CARE ADVICE given per Face Swelling (Adult) guideline. Comments User: Sallyanne Kuster, RN Date/Time Eilene Ghazi Time): 08/14/2020 12:03:57 PM Just started Lisinopril 3 days ago. Referrals GO TO FACILITY OTHER - SPECIFY  Pt in ED now.

## 2020-08-15 ENCOUNTER — Other Ambulatory Visit: Payer: Self-pay | Admitting: Family

## 2020-08-15 ENCOUNTER — Telehealth: Payer: Self-pay | Admitting: Family

## 2020-08-15 MED ORDER — AMLODIPINE BESYLATE 5 MG PO TABS: 5.0000 mg | ORAL_TABLET | Freq: Every day | ORAL | 3 refills | Status: DC

## 2020-08-15 MED FILL — AMLODIPINE BESYLATE 5 MG TA: 5 | 30 days supply | Qty: 30 | Fill #0

## 2020-08-15 NOTE — Telephone Encounter (Signed)
Patient states she was seeing at the emergency room on yesterday. They discontinue her blood pressure medication. She was told to call her primary doctor to figure out a new plan. Sheryl [name] has no available appointment. Offer appointment with different doc patient decline. She would like to a phone call back.

## 2020-08-15 NOTE — Telephone Encounter (Signed)
So I would like her to increase her amlodipine from 2.5mg  to 5mg  once daily. I sent new rx downstairs.   Follow up with me in 2 weeks.

## 2020-08-15 NOTE — Telephone Encounter (Signed)
Spoke with pt:  Pt says she would like to start Amlodipine at 2.5 mg as she did August 2021. She says she Bps looked a lot better than what they do now on the lower dose. She asks if the Lisinopril can be added to her Allergy list that was prescribed 08-06-2020. She stopped the medication after 1 or 2 doses due to extreme diarrhea.   She also says she suspects the edema in her face may have been an insect bite or something. Her BP last night after getting home from the ER was about 114/79.   She also asks if she needs a referral to Cardiology to explore why she has BP issues when she works out regularly and eats a healthy diet.   F/u has been scheduled.

## 2020-08-16 ENCOUNTER — Other Ambulatory Visit: Payer: Self-pay

## 2020-08-16 MED ORDER — AMLODIPINE BESYLATE 5 MG PO TABS: ORAL_TABLET | ORAL | 3 refills | Status: DC

## 2020-08-16 NOTE — Progress Notes (Signed)
Medication sent to [facility] per pt request since they will deliver for her. Also edited the sig to reflect new directions to take 1/2 tab daily.

## 2020-08-16 NOTE — Telephone Encounter (Signed)
Pt is aware of all of the below and voices understanding.

## 2020-08-16 NOTE — Telephone Encounter (Signed)
Lisinopril added to allergy list.   I would like her to cut the 5mg  tabs of amlodipine in half for now and take 1/2 tab once daily.  Check bp daily for 1 week and then send me her readings via mychart.  Unless she is having other heart issues such as chest pain or palpitations, I don't think she needs a cardiology consult.  It is very common for people her age to require 1-2 blood pressure medications even if they eat a healthy diet.  Weight loss can help bring down her blood pressure.  I am happy to talk more with her about this at her upcoming visit.

## 2020-08-26 ENCOUNTER — Other Ambulatory Visit: Payer: Self-pay | Admitting: Pulmonary Disease

## 2020-08-26 ENCOUNTER — Other Ambulatory Visit: Payer: Self-pay | Admitting: Family

## 2020-08-28 ENCOUNTER — Other Ambulatory Visit: Payer: Self-pay

## 2020-08-29 ENCOUNTER — Encounter: Payer: Self-pay | Admitting: Family

## 2020-08-29 ENCOUNTER — Ambulatory Visit (INDEPENDENT_AMBULATORY_CARE_PROVIDER_SITE_OTHER): Payer: Medicare Other | Admitting: Family

## 2020-08-29 ENCOUNTER — Other Ambulatory Visit: Payer: Self-pay | Admitting: Family

## 2020-08-29 VITALS — BP 153/85 | HR 76 | Temp 98.4°F | Resp 16 | Ht 62.5 in | Wt 174.0 lb

## 2020-08-29 DIAGNOSIS — L03211 Cellulitis of face: Secondary | ICD-10-CM | POA: Diagnosis not present

## 2020-08-29 DIAGNOSIS — I10 Essential (primary) hypertension: Secondary | ICD-10-CM | POA: Diagnosis not present

## 2020-08-29 DIAGNOSIS — E871 Hypo-osmolality and hyponatremia: Secondary | ICD-10-CM | POA: Diagnosis not present

## 2020-08-29 DIAGNOSIS — R220 Localized swelling, mass and lump, head: Secondary | ICD-10-CM | POA: Diagnosis not present

## 2020-08-29 LAB — BASIC METABOLIC PANEL
BUN: 19 mg/dL (ref 6–23)
CO2: 28 mEq/L (ref 19–32)
Calcium: 8.7 mg/dL (ref 8.4–10.5)
Chloride: 102 mEq/L (ref 96–112)
Creatinine, Ser: 0.85 mg/dL (ref 0.40–1.20)
GFR: 68.07 mL/min (ref >60.00)
Glucose, Bld: 91 mg/dL (ref 70–99)
Potassium: 4.8 mEq/L (ref 3.5–5.1)
Sodium: 135 mEq/L (ref 135–145)

## 2020-08-29 MED ORDER — AMLODIPINE BESYLATE 5 MG PO TABS: 5.0000 mg | ORAL_TABLET | Freq: Every day | ORAL | 3 refills | Status: DC

## 2020-08-29 MED ORDER — NYSTATIN 100000 UNIT/GM EX POWD: Freq: Four times a day (QID) | CUTANEOUS | 1 refills | Status: DC

## 2020-08-29 MED FILL — NYSTATIN 100,000 UNIT/GM PO: 100000 | 15 days supply | Qty: 30 | Fill #0

## 2020-08-29 NOTE — Patient Instructions (Addendum)
Please increase amlodipine to a full tablet once daily. You should be contacted about scheduling your appointment with ENT.  Please call if you develop recurrent facial swelling or tenderness.

## 2020-08-29 NOTE — Progress Notes (Signed)
Subjective:    Patient ID: Sheryl [name], female    DOB: 12-Jan-1946, 75 y.o.   MRN: [id number]  HPI  Patient is a 75 yr old female who presents today for ED follow up. She was seen in the ED on 08/14/20 for facial swelling. CT of soft tissue neck noted:    Left lower facial cellulitis without evidence of abscess.    Approximately 1 cm lesion within the right parapharyngeal fat at the  level of palate. May reflect a nerve sheath tumor, minor/ectopic  salivary gland tumor, or deep lobe parotid tumor. Most likely benign  but malignancy is not excluded by this study. Outpatient ENT  follow-up is recommended.   She was treated with IV clindamycin in the ED which was followed by oral clindamycin for an addition 7 days. She as advised by the ED to stop lisinopril as they were concerned that her facial swelling could be secondary to mild angioedema.  She states that she had an oral HSV ulcer prior to developing cellulitis.   She reports that her home BP readings are erratic.  Reports that she could not sleep on the 3rd.   sbp 114-159/80-124 (x 1)  BP Readings from Last 3 Encounters:  08/29/20 (!) 153/85  08/14/20 (!) 164/85  04/27/20 (!) 144/72     Review of Systems    see HPI   Past Medical History:  Diagnosis Date  . Anemia    pernicious  . Aortic ectasia (HCC)    infrarenal abdomina aorta 2.6 cm  . Asthma   . Colon cancer (HCC) 2012  . Diabetes mellitus    type 2  . Fatty liver 01/19/2015  . 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 History of pericarditis 07/26/2012  . Hyperlipidemia   . Hypertension   . Pericarditis 07/26/2012  . Rheumatoid arthritis(714.0)   . Sjogren's syndrome (HCC) 03/17/2011  . Small bowel obstruction (HCC) 03/2014   ?due to adhesions from colon surgery per pt  . Thyroid disease    hypothyroid  . Tobacco abuse      Social History   Socioeconomic History  . Marital status: Single    Spouse name: Not on file   . Number of children: 1  . Years of education: Not on file  . Highest education level: Not on file  Occupational History  . Occupation: unemployed    Employer: DISABLED  Tobacco Use  . Smoking status: Current Some Day Smoker    Types: Cigarettes  . Smokeless tobacco: Never Used  . Tobacco comment: smokes occasionally but not everyday  Substance and Sexual Activity  . Alcohol use: Yes    Alcohol/week: 1.0 standard drink    Types: 1 Glasses of wine per week    Comment: occasionally  . Drug use: Yes    Comment: Smokes "medical grade" marijuana 2 puffs 3 x weekly for rheumatoid arthritis.per pt  . Sexual activity: Never  Other Topics Concern  . Not on file  Social History Narrative   Regular exercise:  Stretching exercises. Resistance bands   Caffeine: 1 mug (2cus) daily.      Social Determinants of Health   Financial Resource Strain: Low Risk   . Difficulty of Paying Living Expenses: Not hard at all  Food Insecurity: No Food Insecurity  . Worried About Programme researcher, broadcasting/film/video in the Last Year: Never true  . Ran Out of Food in the Last Year: Never true  Transportation Needs: No Transportation  Needs  . Lack of Transportation (Medical): No  . Lack of Transportation (Non-Medical): No  Physical Activity: Sufficiently Active  . Days of Exercise per Week: 7 days  . Minutes of Exercise per Session: 30 min  Stress: No Stress Concern Present  . Feeling of Stress : Not at all  Social Connections: Moderately Isolated  . Frequency of Communication with Friends and Family: More than three times a week  . Frequency of Social Gatherings with Friends and Family: Once a week  . Attends Religious Services: Never  . Active Member of Clubs or Organizations: Yes  . Attends Archivist Meetings: 1 to 4 times per year  . Marital Status: Divorced  Human resources officer Violence: Not At Risk  . Fear of Current or Ex-Partner: No  . Emotionally Abused: No  . Physically Abused: No  . Sexually  Abused: No    Past Surgical History:  Procedure Laterality Date  . CHOLECYSTECTOMY  10/23/2016  . COLON SURGERY  8/.23/13   tumor removed from sigmoid colon.  Marland Kitchen DILATION AND CURETTAGE OF UTERUS  1975  . HERNIA REPAIR  10/23/2016  . KNEE SURGERY  2006   arthroscopic left knee  . KNEE SURGERY  2001   right knee    Family History  Problem Relation Age of Onset  . Heart disease Other        CAD  . Diabetes Other   . Hyperlipidemia Other   . Stroke Other     Allergies  Allergen Reactions  . Levofloxacin Shortness Of Breath and Rash  . Mold Extract [Trichophyton] Anaphylaxis  . Ativan [Lorazepam]     Visual hallucinations.  Visual hallucinations.   . Cefuroxime Axetil     REACTION: asthma, cough  . Hydrocodone-Homatropine Nausea Only  . Lisinopril     diarrhea  . Rosuvastatin Calcium     Muscle pain, syncope  . Statins     Muscle pain, syncope  . Zetia [Ezetimibe] Diarrhea  . Hydrocodone-Acetaminophen Nausea Only    Current Outpatient Medications on File Prior to Visit  Medication Sig Dispense Refill  . albuterol (VENTOLIN HFA) 108 (90 Base) MCG/ACT inhaler Inhale 2 puffs into the lungs every 6 (six) hours as needed for wheezing or shortness of breath. 54 g 1  . aspirin 81 MG tablet Take 81 mg by mouth daily.    . betamethasone dipropionate 0.05 % cream APPLY TOPICALLY TWO TIMES ADAY 30 g 1  . estradiol (ESTRACE VAGINAL) 0.1 MG/GM vaginal cream 2g daily intravaginally for 1 week, then reduce to 1 gram PV daily for 1 week , followed by a maintenance dose of 1 g PV once weekly 42.5 g 5  . fluticasone (FLONASE) 50 MCG/ACT nasal spray Place 2 sprays into both nostrils daily as needed. 29.7 g 1  . levothyroxine (SYNTHROID) 100 MCG tablet TAKE 1 TABLET DAILY 90 tablet 1  . montelukast (SINGULAIR) 10 MG tablet TAKE 1 TABLET AT BEDTIME 90 tablet 1  . multivitamin (THERAGRAN) per tablet Take 1 tablet by mouth daily.    . NON FORMULARY TUMERIC MILK.    . NON FORMULARY CBD.  Derivative of marijuana without the marijuana.    . nystatin (MYCOSTATIN/NYSTOP) powder Apply topically 4 (four) times daily. 30 g 1  . pantoprazole (PROTONIX) 40 MG tablet TAKE 1 TABLET DAILY 90 tablet 1  . Probiotic Product (PROBIOTIC DAILY PO) Take 1 capsule by mouth daily.    . traZODone (DESYREL) 50 MG tablet 1/2 to 1 tab by mouth  at bedtime as needed for insomnia 30 tablet 1  . TRELEGY ELLIPTA 100-62.5-25 MCG/INH AEPB USE 1 INHALATION ORALLY    DAILY 60 each 1  . VOLTAREN 1 % GEL APPLY 2 GRAMS TOPICALLY 4 TIMES AS NEEDED 100 g 2  . [DISCONTINUED] lisinopril (ZESTRIL) 10 MG tablet Take 1 tablet (10 mg total) by mouth daily. 90 tablet 1   No current facility-administered medications on file prior to visit.    BP (!) 153/85 (BP Location: Right Arm, Patient Position: Sitting, Cuff Size: Small)   Pulse 76   Temp 98.4 F (36.9 C) (Oral)   Resp 16   Ht 5' 2.5" (1.588 m)   Wt 174 lb (78.9 kg)   SpO2 100%   BMI 31.32 kg/m    Objective:   Physical Exam Constitutional:      Appearance: Normal appearance.  HENT:     Head: Normocephalic.     Jaw: No tenderness or swelling.     Salivary Glands: Right salivary gland is not diffusely enlarged. Left salivary gland is not diffusely enlarged.  Skin:    General: Skin is warm and dry.  Neurological:     Mental Status: She is alert.           Assessment & Plan:  Facial Cellulitis- resolved.  Monitor.   Palatal Mass- refer to ENT for further evaluation.  HTN- uncontrolled.  Increase amlodipine from 2.5mg  to 5mg .  Hyponatremia- noted on ED labs. Repeat bmet today.  This visit occurred during the SARS-CoV-2 public health emergency.  Safety protocols were in place, including screening questions prior to the visit, additional usage of staff PPE, and extensive cleaning of exam room while observing appropriate contact time as indicated for disinfecting solutions.

## 2020-09-06 ENCOUNTER — Other Ambulatory Visit: Payer: Self-pay

## 2020-09-07 ENCOUNTER — Other Ambulatory Visit: Payer: Self-pay

## 2020-09-07 ENCOUNTER — Ambulatory Visit (INDEPENDENT_AMBULATORY_CARE_PROVIDER_SITE_OTHER): Payer: Medicare Other

## 2020-09-07 DIAGNOSIS — E538 Deficiency of other specified B group vitamins: Secondary | ICD-10-CM | POA: Diagnosis not present

## 2020-09-07 MED ORDER — CYANOCOBALAMIN 1000 MCG/ML IJ SOLN
1000.0000 ug | Freq: Once | INTRAMUSCULAR | Status: AC
Start: 2020-09-07 — End: 2020-09-07
  Administered 2020-09-07: 1000 ug via INTRAMUSCULAR

## 2020-09-07 NOTE — Progress Notes (Signed)
Pt here today for B12 injection.   Cyanocobalamin 1060mcg injected into R deltoid. Pt tolerated injection well.   Next in 4 weeks. -JMA

## 2020-09-20 ENCOUNTER — Other Ambulatory Visit: Payer: Self-pay | Admitting: Family

## 2020-10-01 MED FILL — AMLODIPINE BESYLATE 5 MG TA: 5 | 30 days supply | Qty: 30 | Fill #1

## 2020-10-09 ENCOUNTER — Other Ambulatory Visit: Payer: Self-pay

## 2020-10-09 ENCOUNTER — Ambulatory Visit (INDEPENDENT_AMBULATORY_CARE_PROVIDER_SITE_OTHER): Payer: Medicare Other

## 2020-10-09 DIAGNOSIS — E538 Deficiency of other specified B group vitamins: Secondary | ICD-10-CM

## 2020-10-09 MED ORDER — CYANOCOBALAMIN 1000 MCG/ML IJ SOLN
1000.0000 ug | Freq: Once | INTRAMUSCULAR | Status: AC
  Administered 2020-10-09: 1000 ug via INTRAMUSCULAR

## 2020-10-09 NOTE — Progress Notes (Signed)
Pt here for monthly B12 injection per [name] Osullivan  B12 1000mcg given IM, and pt tolerated injection well.  Next B12 injection scheduled for next month  

## 2020-10-20 ENCOUNTER — Other Ambulatory Visit: Payer: Self-pay | Admitting: Family

## 2020-10-20 ENCOUNTER — Other Ambulatory Visit: Payer: Self-pay | Admitting: Pulmonary Disease

## 2020-10-29 MED FILL — AMLODIPINE BESYLATE 5 MG TA: 5 | 30 days supply | Qty: 30 | Fill #2

## 2020-11-02 ENCOUNTER — Other Ambulatory Visit: Payer: Self-pay

## 2020-11-02 ENCOUNTER — Ambulatory Visit (INDEPENDENT_AMBULATORY_CARE_PROVIDER_SITE_OTHER): Payer: Medicare Other | Admitting: Family

## 2020-11-02 ENCOUNTER — Telehealth: Payer: Self-pay

## 2020-11-02 ENCOUNTER — Other Ambulatory Visit: Payer: Self-pay | Admitting: Family

## 2020-11-02 VITALS — BP 154/80 | HR 80 | Temp 98.7°F | Resp 16 | Ht 62.5 in | Wt 175.0 lb

## 2020-11-02 DIAGNOSIS — L509 Urticaria, unspecified: Secondary | ICD-10-CM

## 2020-11-02 DIAGNOSIS — J4520 Mild intermittent asthma, uncomplicated: Secondary | ICD-10-CM | POA: Diagnosis not present

## 2020-11-02 DIAGNOSIS — J45909 Unspecified asthma, uncomplicated: Secondary | ICD-10-CM

## 2020-11-02 MED ORDER — PREDNISONE 10 MG PO TABS
ORAL_TABLET | ORAL | 0 refills | Status: DC
Start: 2020-11-02 — End: 2020-11-02

## 2020-11-02 MED FILL — predniSONE 10 MG TABS: 10 | 8 days supply | Qty: 20 | Fill #0

## 2020-11-02 NOTE — Progress Notes (Signed)
Subjective:    Patient ID: Sheryl [name], female    DOB: October 22, 1945, 75 y.o.   MRN: [id number]  HPI  Patient is a 75 yr old female who presents today with chief complaint of skin rash. Started 2 days ago.  Improved with benadryl.  Highly pruritic.    She reports some SOB.  Reports that symptoms improve with albuterol.    Has not taken bp medications the last 2 days.   Review of Systems    see HPI  Past Medical History:  Diagnosis Date  . Anemia    pernicious  . Aortic ectasia (HCC)    infrarenal abdomina aorta 2.6 cm  . Asthma   . Colon cancer ([place]) 2012  . Diabetes mellitus    type 2  . Fatty liver 01/19/2015  . 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 History of pericarditis 07/26/2012  . Hyperlipidemia   . Hypertension   . Pericarditis 07/26/2012  . Rheumatoid arthritis(714.0)   . Sjogren's syndrome ([place]) 03/17/2011  . Small bowel obstruction ([place]) 03/2014   ?due to adhesions from colon surgery per pt  . Thyroid disease    hypothyroid  . Tobacco abuse      Social History   Socioeconomic History  . Marital status: Single    Spouse name: Not on file  . Number of children: 1  . Years of education: Not on file  . Highest education level: Not on file  Occupational History  . Occupation: unemployed    Employer: DISABLED  Tobacco Use  . Smoking status: Current Some Day Smoker    Types: Cigarettes  . Smokeless tobacco: Never Used  . Tobacco comment: smokes occasionally but not everyday  Substance and Sexual Activity  . Alcohol use: Yes    Alcohol/week: 1.0 standard drink    Types: 1 Glasses of wine per week    Comment: occasionally  . Drug use: Yes    Comment: Smokes "medical grade" marijuana 2 puffs 3 x weekly for rheumatoid arthritis.per pt  . Sexual activity: Never  Other Topics Concern  . Not on file  Social History Narrative   Regular exercise:  Stretching exercises. Resistance bands   Caffeine: 1 mug (2cus) daily.       Social Determinants of Health   Financial Resource Strain: Low Risk   . Difficulty of Paying Living Expenses: Not hard at all  Food Insecurity: No Food Insecurity  . Worried About Charity fundraiser in the Last Year: Never true  . Ran Out of Food in the Last Year: Never true  Transportation Needs: No Transportation Needs  . Lack of Transportation (Medical): No  . Lack of Transportation (Non-Medical): No  Physical Activity: Sufficiently Active  . Days of Exercise per Week: 7 days  . Minutes of Exercise per Session: 30 min  Stress: No Stress Concern Present  . Feeling of Stress : Not at all  Social Connections: Moderately Isolated  . Frequency of Communication with Friends and Family: More than three times a week  . Frequency of Social Gatherings with Friends and Family: Once a week  . Attends Religious Services: Never  . Active Member of Clubs or Organizations: Yes  . Attends Archivist Meetings: 1 to 4 times per year  . Marital Status: Divorced  Human resources officer Violence: Not At Risk  . Fear of Current or Ex-Partner: No  . Emotionally Abused: No  . Physically Abused: No  . Sexually Abused: No  Past Surgical History:  Procedure Laterality Date  . CHOLECYSTECTOMY  10/23/2016  . COLON SURGERY  8/.23/13   tumor removed from sigmoid colon.  Marland Kitchen DILATION AND CURETTAGE OF UTERUS  1975  . HERNIA REPAIR  10/23/2016  . KNEE SURGERY  2006   arthroscopic left knee  . KNEE SURGERY  2001   right knee    Family History  Problem Relation Age of Onset  . Heart disease Other        CAD  . Diabetes Other   . Hyperlipidemia Other   . Stroke Other     Allergies  Allergen Reactions  . Levofloxacin Shortness Of Breath and Rash  . Mold Extract [Trichophyton] Anaphylaxis  . Ativan [Lorazepam]     Visual hallucinations.  Visual hallucinations.   . Cefuroxime Axetil     REACTION: asthma, cough  . Hydrocodone-Homatropine Nausea Only  . Lisinopril     diarrhea  .  Rosuvastatin Calcium     Muscle pain, syncope  . Statins     Muscle pain, syncope  . Zetia [Ezetimibe] Diarrhea  . Hydrocodone-Acetaminophen Nausea Only    Current Outpatient Medications on File Prior to Visit  Medication Sig Dispense Refill  . albuterol (VENTOLIN HFA) 108 (90 Base) MCG/ACT inhaler Inhale 2 puffs into the lungs every 6 (six) hours as needed for wheezing or shortness of breath. 54 g 1  . amLODipine (NORVASC) 5 MG tablet Take 1 tablet (5 mg total) by mouth daily. 30 tablet 3  . aspirin 81 MG tablet Take 81 mg by mouth daily.    . betamethasone dipropionate 0.05 % cream APPLY TOPICALLY TWO TIMES ADAY 30 g 1  . estradiol (ESTRACE VAGINAL) 0.1 MG/GM vaginal cream 2g daily intravaginally for 1 week, then reduce to 1 gram PV daily for 1 week , followed by a maintenance dose of 1 g PV once weekly 42.5 g 5  . fluticasone (FLONASE) 50 MCG/ACT nasal spray Place 2 sprays into both nostrils daily as needed. 29.7 g 1  . levothyroxine (SYNTHROID) 100 MCG tablet TAKE 1 TABLET DAILY 90 tablet 1  . montelukast (SINGULAIR) 10 MG tablet TAKE 1 TABLET AT BEDTIME 90 tablet 1  . multivitamin (THERAGRAN) per tablet Take 1 tablet by mouth daily.    . NON FORMULARY TUMERIC MILK.    . NON FORMULARY CBD. Derivative of marijuana without the marijuana.    . nystatin (MYCOSTATIN/NYSTOP) powder Apply topically 4 (four) times daily. 30 g 1  . pantoprazole (PROTONIX) 40 MG tablet TAKE 1 TABLET DAILY 90 tablet 1  . Probiotic Product (PROBIOTIC DAILY PO) Take 1 capsule by mouth daily.    . traZODone (DESYREL) 50 MG tablet 1/2 to 1 tab by mouth at bedtime as needed for insomnia 30 tablet 1  . TRELEGY ELLIPTA 100-62.5-25 MCG/INH AEPB USE 1 INHALATION ORALLY    DAILY 1 each 5  . VOLTAREN 1 % GEL APPLY 2 GRAMS TOPICALLY 4 TIMES AS NEEDED 100 g 2  . [DISCONTINUED] lisinopril (ZESTRIL) 10 MG tablet Take 1 tablet (10 mg total) by mouth daily. 90 tablet 1   No current facility-administered medications on file  prior to visit.    BP (!) 154/80 (BP Location: Right Arm, Patient Position: Sitting, Cuff Size: Small)   Pulse 80   Temp 98.7 F (37.1 C) (Oral)   Resp 16   Ht 5' 2.5" (1.588 m)   Wt 175 lb (79.4 kg)   SpO2 97%   BMI 31.50 kg/m  Objective:   Physical Exam Constitutional:      Appearance: She is well-developed.  Cardiovascular:     Rate and Rhythm: Normal rate and regular rhythm.     Heart sounds: Normal heart sounds. No murmur heard.   Pulmonary:     Effort: Pulmonary effort is normal. No respiratory distress.     Breath sounds: Normal breath sounds. No wheezing.  Skin:    Comments: Raised urticarial rash noted overlying bilateral rib cages and right hip  Psychiatric:        Behavior: Behavior normal.        Thought Content: Thought content normal.        Judgment: Judgment normal.              Assessment & Plan:  Urticaria- offered solumedrol IM, pt declines. Advised pt as follows:    Please begin prednisone taper.  Continue benadryl as needed.  Continue zyrtec 10mg  once daily.  Please restart amlodipine 5mg .  Do not restart lisinopril.    HTN- will restart amlodipine 5mg .  Since lisinopril is her newest medication, I prefer not to restart in case this is contributing to her hives. Will plan to bring her back in 1 week for bp/rash recheck.   Asthma- no wheezing today on exam but she notes increased symptoms. I expect that the prednisone taper will help her asthma as well.  This visit occurred during the SARS-CoV-2 public health emergency.  Safety protocols were in place, including screening questions prior to the visit, additional usage of staff PPE, and extensive cleaning of exam room while observing appropriate contact time as indicated for disinfecting solutions.

## 2020-11-02 NOTE — Patient Instructions (Addendum)
Please begin prednisone taper.  Continue benadryl as needed.  Continue zyrtec 10mg  once daily.  Please restart amlodipine 5mg .  Do not restart lisinopril.

## 2020-11-02 NOTE — Telephone Encounter (Signed)
Patient has appt today with PCP.    [facility] - Client TELEPHONE ADVICE RECORD [facility] Patient Name: Sheryl [name] Gender: Female DOB: 07-19-1946 Age: 75 Y 20 M 18 D Return Phone Number: [phone] (Primary) Address: City/State/Zip: [place] [address] Client  [facility] - Client Client Site [facility] - Day Physician [name] - NP Contact Type Call Who Is Calling Patient / Member / Family / Caregiver Call Type Triage / Clinical Relationship To Patient Self Return Phone Number [phone] (Primary) Chief Complaint Medication reaction Reason for Call Symptomatic / Request for Health Information Initial Comment Caller states she has swelling and hives to bra area. Bright red . Hx of cellulitis. She also had a medication change and thinks she is having reaction to medication. Translation No Nurse Assessment Nurse: Raphael Gibney, RN, Vanita Ingles Date/Time (Eastern Time): 11/01/2020 10:07:18 AM Confirm and document reason for call. If symptomatic, describe symptoms. ---Caller states she has hives all over her torso. had cellulitis a month ago. she is on a new BP medication. Hives are itching and puffy. no fever. oxygen level 99%. she is taking amlodipine. Does the patient have any new or worsening symptoms? ---Yes Will a triage be completed? ---Yes Related visit to physician within the last 2 weeks? ---No Does the PT have any chronic conditions? (i.e. diabetes, asthma, this includes High risk factors for pregnancy, etc.) ---Yes List chronic conditions. ---RA; HTN; allergies; asthma; prediabetes Is this a behavioral health or substance abuse call? ---No Guidelines Guideline Title Affirmed Question Affirmed Notes Nurse Date/Time (Eastern Time) Hives [1] MODERATESEVERE hives persist (i.e., hives interfere with normal activities or work) AND [2] taking antihistamine (e.g., Benadryl, Claritin) >  24 hours Raphael Gibney, Therapist, sports, [place] 11/01/2020 10:15:31 AM Disp. Time Eilene Ghazi Time) Disposition Final User 11/01/2020 9:56:21 AM Attempt made - message left Raphael Gibney, RN, Vanita Ingles PLEASE NOTE: All timestamps contained within this report are represented as [place] Standard Time. CONFIDENTIALTY NOTICE: This fax transmission is intended only for the addressee. It contains information that is legally privileged, confidential or otherwise protected from use or disclosure. If you are not the intended recipient, you are strictly prohibited from reviewing, disclosing, copying using or disseminating any of this information or taking any action in reliance on or regarding this information. If you have received this fax in error, please notify us immediately by telephone so that we can arrange for its return to [place]. Phone: [phone], Toll-Free: [phone], Fax: [phone] Page: 2 of 2 Call Id: [id number] 11/01/2020 10:28:58 AM See PCP within 24 Hours Yes Raphael Gibney, RN, Doreatha Lew Disagree/Comply Comply Caller Understands Yes PreDisposition Call Doctor Care Advice Given Per Guideline SEE PCP WITHIN 24 HOURS: * IF OFFICE WILL BE OPEN: You need to be examined within the next 24 hours. Call your doctor (or NP/PA) when the office opens and make an appointment. ANTIHISTAMINE MEDICINES FOR WIDESPREAD HIVES: * For widespread hives, take either cetirizine or loratadine. They can help reduce the rash, itching, and other allergic symptoms. CALL BACK IF: * You become worse * They are over-the-counter (OTC) antihistamine medicines. You can buy them at the drugstore. HYDROCORTISONE CREAM FOR ITCHING: * You can use hydrocortisone for very itchy spots. * Put 1% hydrocortisone cream on the itchy area(s) 3 times a day. Use it for a couple days, until it feels better. This will help decrease the itching. Comments User: Dannielle Burn, RN Date/Time Eilene Ghazi Time): 11/01/2020 10:28:05 AM pt states she has appt at 8:45 am  tomorrow. Referrals REFERRED TO PCP OFFICE

## 2020-11-06 ENCOUNTER — Ambulatory Visit (INDEPENDENT_AMBULATORY_CARE_PROVIDER_SITE_OTHER): Payer: Medicare Other

## 2020-11-06 ENCOUNTER — Other Ambulatory Visit: Payer: Self-pay

## 2020-11-06 DIAGNOSIS — E538 Deficiency of other specified B group vitamins: Secondary | ICD-10-CM | POA: Diagnosis not present

## 2020-11-06 MED ORDER — CYANOCOBALAMIN 1000 MCG/ML IJ SOLN
1000.0000 ug | Freq: Once | INTRAMUSCULAR | Status: AC
  Administered 2020-11-06: 1000 ug via INTRAMUSCULAR

## 2020-11-06 NOTE — Progress Notes (Signed)
Sheryl [name] is a 75 y.o. female presents to the office today for monthly B12 injection, per physician's orders. Original order: 04/20/2020- [name] B12 1000 mcg administered IM  (location) today. Patient tolerated injection. Patient due for follow up labs/provider appt:can not tel. Patient next injection due: 1 month , appt made yes.   Creft, Darlis Loan, RMA

## 2020-11-09 ENCOUNTER — Encounter: Payer: Self-pay | Admitting: Family

## 2020-11-09 ENCOUNTER — Other Ambulatory Visit: Payer: Self-pay

## 2020-11-09 ENCOUNTER — Ambulatory Visit (INDEPENDENT_AMBULATORY_CARE_PROVIDER_SITE_OTHER): Payer: Medicare Other | Admitting: Family

## 2020-11-09 VITALS — BP 148/92 | HR 78 | Temp 98.8°F | Resp 16 | Ht 62.5 in | Wt 175.0 lb

## 2020-11-09 DIAGNOSIS — J45909 Unspecified asthma, uncomplicated: Secondary | ICD-10-CM

## 2020-11-09 DIAGNOSIS — I10 Essential (primary) hypertension: Secondary | ICD-10-CM

## 2020-11-09 DIAGNOSIS — L509 Urticaria, unspecified: Secondary | ICD-10-CM

## 2020-11-09 NOTE — Patient Instructions (Signed)
Please check blood pressure once daily for 1 week and then send your readings via mychart.

## 2020-11-09 NOTE — Progress Notes (Signed)
Subjective:    Patient ID: Sheryl [name], female    DOB: 1945/11/29, 75 y.o.   MRN: [id number]  HPI  Patient is a 75 yr old female who presents today for follow up.   Urticaria- last visit we treated with prednisone taper/zyrtec/benadryl. Reports resolution of rash. Joint pain and asthma symptoms are improved as well.    HTN- last visit we stopped lisinopril due to rash/hives and started amlodipine 5mg .  BP Readings from Last 3 Encounters:  11/09/20 (!) 148/92  11/02/20 (!) 154/80  08/29/20 (!) 153/85     Review of Systems    see HPI  Past Medical History:  Diagnosis Date  . Anemia    pernicious  . Aortic ectasia (HCC)    infrarenal abdomina aorta 2.6 cm  . Asthma   . Colon cancer ([place]) 2012  . Diabetes mellitus    type 2  . Fatty liver 01/19/2015  . 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 History of pericarditis 07/26/2012  . Hyperlipidemia   . Hypertension   . Pericarditis 07/26/2012  . Rheumatoid arthritis(714.0)   . Sjogren's syndrome ([place]) 03/17/2011  . Small bowel obstruction ([place]) 03/2014   ?due to adhesions from colon surgery per pt  . Thyroid disease    hypothyroid  . Tobacco abuse      Social History   Socioeconomic History  . Marital status: Single    Spouse name: Not on file  . Number of children: 1  . Years of education: Not on file  . Highest education level: Not on file  Occupational History  . Occupation: unemployed    Employer: DISABLED  Tobacco Use  . Smoking status: Current Some Day Smoker    Types: Cigarettes  . Smokeless tobacco: Never Used  . Tobacco comment: smokes occasionally but not everyday  Substance and Sexual Activity  . Alcohol use: Yes    Alcohol/week: 1.0 standard drink    Types: 1 Glasses of wine per week    Comment: occasionally  . Drug use: Yes    Comment: Smokes "medical grade" marijuana 2 puffs 3 x weekly for rheumatoid arthritis.per pt  . Sexual activity: Never  Other Topics  Concern  . Not on file  Social History Narrative   Regular exercise:  Stretching exercises. Resistance bands   Caffeine: 1 mug (2cus) daily.      Social Determinants of Health   Financial Resource Strain: Low Risk   . Difficulty of Paying Living Expenses: Not hard at all  Food Insecurity: No Food Insecurity  . Worried About Charity fundraiser in the Last Year: Never true  . Ran Out of Food in the Last Year: Never true  Transportation Needs: No Transportation Needs  . Lack of Transportation (Medical): No  . Lack of Transportation (Non-Medical): No  Physical Activity: Sufficiently Active  . Days of Exercise per Week: 7 days  . Minutes of Exercise per Session: 30 min  Stress: No Stress Concern Present  . Feeling of Stress : Not at all  Social Connections: Moderately Isolated  . Frequency of Communication with Friends and Family: More than three times a week  . Frequency of Social Gatherings with Friends and Family: Once a week  . Attends Religious Services: Never  . Active Member of Clubs or Organizations: Yes  . Attends Archivist Meetings: 1 to 4 times per year  . Marital Status: Divorced  Human resources officer Violence: Not At Risk  . Fear  of Current or Ex-Partner: No  . Emotionally Abused: No  . Physically Abused: No  . Sexually Abused: No    Past Surgical History:  Procedure Laterality Date  . CHOLECYSTECTOMY  10/23/2016  . COLON SURGERY  8/.23/13   tumor removed from sigmoid colon.  Marland Kitchen DILATION AND CURETTAGE OF UTERUS  1975  . HERNIA REPAIR  10/23/2016  . KNEE SURGERY  2006   arthroscopic left knee  . KNEE SURGERY  2001   right knee    Family History  Problem Relation Age of Onset  . Heart disease Other        CAD  . Diabetes Other   . Hyperlipidemia Other   . Stroke Other     Allergies  Allergen Reactions  . Levofloxacin Shortness Of Breath and Rash  . Mold Extract [Trichophyton] Anaphylaxis  . Ativan [Lorazepam]     Visual hallucinations.   Visual hallucinations.   . Cefuroxime Axetil     REACTION: asthma, cough  . Hydrocodone-Homatropine Nausea Only  . Lisinopril     Diarrhea hives  . Rosuvastatin Calcium     Muscle pain, syncope  . Statins     Muscle pain, syncope  . Zetia [Ezetimibe] Diarrhea  . Hydrocodone-Acetaminophen Nausea Only    Current Outpatient Medications on File Prior to Visit  Medication Sig Dispense Refill  . albuterol (VENTOLIN HFA) 108 (90 Base) MCG/ACT inhaler Inhale 2 puffs into the lungs every 6 (six) hours as needed for wheezing or shortness of breath. 54 g 1  . amLODipine (NORVASC) 5 MG tablet Take 1 tablet (5 mg total) by mouth daily. 30 tablet 3  . aspirin 81 MG tablet Take 81 mg by mouth daily.    . betamethasone dipropionate 0.05 % cream APPLY TOPICALLY TWO TIMES ADAY 30 g 1  . estradiol (ESTRACE VAGINAL) 0.1 MG/GM vaginal cream 2g daily intravaginally for 1 week, then reduce to 1 gram PV daily for 1 week , followed by a maintenance dose of 1 g PV once weekly 42.5 g 5  . fluticasone (FLONASE) 50 MCG/ACT nasal spray Place 2 sprays into both nostrils daily as needed. 29.7 g 1  . levothyroxine (SYNTHROID) 100 MCG tablet TAKE 1 TABLET DAILY 90 tablet 1  . montelukast (SINGULAIR) 10 MG tablet TAKE 1 TABLET AT BEDTIME 90 tablet 1  . multivitamin (THERAGRAN) per tablet Take 1 tablet by mouth daily.    . NON FORMULARY TUMERIC MILK.    . NON FORMULARY CBD. Derivative of marijuana without the marijuana.    . nystatin (MYCOSTATIN/NYSTOP) powder Apply topically 4 (four) times daily. 30 g 1  . pantoprazole (PROTONIX) 40 MG tablet TAKE 1 TABLET DAILY 90 tablet 1  . predniSONE (DELTASONE) 10 MG tablet 4 tabs by mouth once daily for 2 days, then 3 tabs daily x 2 days, then 2 tabs daily x 2 days, then 1 tab daily x 2 days 20 tablet 0  . Probiotic Product (PROBIOTIC DAILY PO) Take 1 capsule by mouth daily.    . traZODone (DESYREL) 50 MG tablet 1/2 to 1 tab by mouth at bedtime as needed for insomnia 30 tablet  1  . TRELEGY ELLIPTA 100-62.5-25 MCG/INH AEPB USE 1 INHALATION ORALLY    DAILY 1 each 5  . VOLTAREN 1 % GEL APPLY 2 GRAMS TOPICALLY 4 TIMES AS NEEDED 100 g 2  . [DISCONTINUED] lisinopril (ZESTRIL) 10 MG tablet Take 1 tablet (10 mg total) by mouth daily. 90 tablet 1   No current facility-administered  medications on file prior to visit.    BP (!) 148/92   Pulse 78   Temp 98.8 F (37.1 C) (Oral)   Resp 16   Ht 5' 2.5" (1.588 m)   Wt 175 lb (79.4 kg)   SpO2 100%   BMI 31.50 kg/m    Objective:   Physical Exam Constitutional:      Appearance: She is well-developed.  Cardiovascular:     Rate and Rhythm: Normal rate and regular rhythm.     Heart sounds: Normal heart sounds. No murmur heard.   Pulmonary:     Effort: Pulmonary effort is normal. No respiratory distress.     Breath sounds: Normal breath sounds. No wheezing.  Skin:    General: Skin is warm and dry.     Findings: No rash.  Psychiatric:        Behavior: Behavior normal.        Thought Content: Thought content normal.        Judgment: Judgment normal.           Assessment & Plan:  HTN- bp was high upon arrival, but follow up BP was improved. Will continue amlodipine 5mg  for now. Pt advised as follows:   Please check blood pressure once daily for 1 week and then send your readings via mychart.  Urticaria- resolved following treatment with prednisone.  Monitor.  Asthma- notes improvement in her breathing with prednisone taper.  Monitor.  This visit occurred during the SARS-CoV-2 public health emergency.  Safety protocols were in place, including screening questions prior to the visit, additional usage of staff PPE, and extensive cleaning of exam room while observing appropriate contact time as indicated for disinfecting solutions.

## 2020-11-24 ENCOUNTER — Other Ambulatory Visit (HOSPITAL_BASED_OUTPATIENT_CLINIC_OR_DEPARTMENT_OTHER): Payer: Self-pay

## 2020-11-28 ENCOUNTER — Other Ambulatory Visit (HOSPITAL_BASED_OUTPATIENT_CLINIC_OR_DEPARTMENT_OTHER): Payer: Self-pay

## 2020-11-28 MED FILL — Amlodipine Besylate Tab 5 MG (Base Equivalent): ORAL | 30 days supply | Qty: 30 | Fill #0 | Status: AC

## 2020-11-29 ENCOUNTER — Other Ambulatory Visit: Payer: Self-pay

## 2020-12-11 ENCOUNTER — Other Ambulatory Visit: Payer: Self-pay

## 2020-12-11 ENCOUNTER — Ambulatory Visit (INDEPENDENT_AMBULATORY_CARE_PROVIDER_SITE_OTHER): Payer: Medicare Other

## 2020-12-11 DIAGNOSIS — E538 Deficiency of other specified B group vitamins: Secondary | ICD-10-CM | POA: Diagnosis not present

## 2020-12-11 MED ORDER — CYANOCOBALAMIN 1000 MCG/ML IJ SOLN
1000.0000 ug | Freq: Once | INTRAMUSCULAR | Status: AC
  Administered 2020-12-11: 1000 ug via INTRAMUSCULAR

## 2020-12-11 NOTE — Progress Notes (Signed)
Pt here for monthly B12 injection per Earlie Counts  B12 1083mcg given IM left deltoid, and pt tolerated injection well.  Next B12 injection scheduled for next month.

## 2020-12-17 ENCOUNTER — Other Ambulatory Visit: Payer: Self-pay | Admitting: Family

## 2020-12-27 ENCOUNTER — Other Ambulatory Visit (HOSPITAL_BASED_OUTPATIENT_CLINIC_OR_DEPARTMENT_OTHER): Payer: Self-pay

## 2020-12-27 ENCOUNTER — Other Ambulatory Visit: Payer: Self-pay | Admitting: Family

## 2020-12-28 ENCOUNTER — Other Ambulatory Visit (HOSPITAL_BASED_OUTPATIENT_CLINIC_OR_DEPARTMENT_OTHER): Payer: Self-pay

## 2020-12-28 MED ORDER — AMLODIPINE BESYLATE 5 MG PO TABS
ORAL_TABLET | Freq: Every day | ORAL | 3 refills | Status: DC
  Filled 2020-12-28: qty 30, 30d supply, fill #0
  Filled 2021-02-01: qty 30, 30d supply, fill #1
  Filled 2021-02-27: qty 30, 30d supply, fill #2
  Filled 2021-03-21 – 2021-03-22 (×2): qty 30, 30d supply, fill #3

## 2020-12-28 MED ORDER — LEVOTHYROXINE SODIUM 100 MCG PO TABS
100.0000 ug | ORAL_TABLET | Freq: Every day | ORAL | 1 refills | Status: DC
  Filled 2020-12-28: qty 90, 90d supply, fill #0

## 2020-12-31 ENCOUNTER — Other Ambulatory Visit (HOSPITAL_BASED_OUTPATIENT_CLINIC_OR_DEPARTMENT_OTHER): Payer: Self-pay

## 2021-01-10 ENCOUNTER — Other Ambulatory Visit: Payer: Self-pay

## 2021-01-10 ENCOUNTER — Ambulatory Visit (INDEPENDENT_AMBULATORY_CARE_PROVIDER_SITE_OTHER): Payer: Medicare Other

## 2021-01-10 DIAGNOSIS — E538 Deficiency of other specified B group vitamins: Secondary | ICD-10-CM

## 2021-01-10 MED ORDER — CYANOCOBALAMIN 1000 MCG/ML IJ SOLN
1000.0000 ug | Freq: Once | INTRAMUSCULAR | Status: AC
Start: 2021-01-10 — End: 2021-01-10
  Administered 2021-01-10: 1000 ug via INTRAMUSCULAR

## 2021-01-10 NOTE — Progress Notes (Signed)
Pt here today for monthly B12 injection.   Cyanocobalamin 1058mcg 36mL injected into L deltoid. Pt tolerated injection well.   Next in 1 month. Nurse visit scheduled for 02/12/21.

## 2021-02-01 ENCOUNTER — Other Ambulatory Visit (HOSPITAL_BASED_OUTPATIENT_CLINIC_OR_DEPARTMENT_OTHER): Payer: Self-pay

## 2021-02-04 ENCOUNTER — Ambulatory Visit (HOSPITAL_BASED_OUTPATIENT_CLINIC_OR_DEPARTMENT_OTHER)
Admission: RE | Admit: 2021-02-04 | Discharge: 2021-02-04 | Disposition: A | Discharge status: Home / Self Care | Payer: Medicare Other | Source: Ambulatory Visit | Attending: Family | Admitting: Family

## 2021-02-04 ENCOUNTER — Other Ambulatory Visit: Payer: Self-pay

## 2021-02-04 DIAGNOSIS — Z780 Asymptomatic menopausal state: Secondary | ICD-10-CM | POA: Insufficient documentation

## 2021-02-12 ENCOUNTER — Telehealth: Payer: Self-pay | Admitting: Family

## 2021-02-12 ENCOUNTER — Other Ambulatory Visit: Payer: Self-pay

## 2021-02-12 ENCOUNTER — Ambulatory Visit (INDEPENDENT_AMBULATORY_CARE_PROVIDER_SITE_OTHER): Payer: Medicare Other

## 2021-02-12 DIAGNOSIS — E538 Deficiency of other specified B group vitamins: Secondary | ICD-10-CM

## 2021-02-12 DIAGNOSIS — Z23 Encounter for immunization: Secondary | ICD-10-CM | POA: Diagnosis not present

## 2021-02-12 MED ORDER — CYANOCOBALAMIN 1000 MCG/ML IJ SOLN
1000.0000 ug | Freq: Once | INTRAMUSCULAR | Status: AC
  Administered 2021-02-12: 1000 ug via INTRAMUSCULAR

## 2021-02-12 NOTE — Progress Notes (Signed)
Sheryl [name] is a 75 y.o. female presents to the office today for Monthly b12 injections, per physician's orders.  Cyanocobalamin (med), 1,000 mcg (dose),  IM (route) was administered left deltoid (location) today. Patient tolerated injection. Patient next injection due: one month, appt made Yes  Patient states she is not feeling any benefit or "boost" to these injections yet. Could you advise if alternative or supplement should be given?   [name]

## 2021-02-12 NOTE — Telephone Encounter (Signed)
Please advise pt that she will not likely feel a "boost" from the b12 injections, however it helps her body to keep blood counts up, prevents some problems with memory loss, prevents neuropathy symptoms that can be caused by low level b12.

## 2021-02-14 ENCOUNTER — Telehealth: Payer: Self-pay

## 2021-02-14 NOTE — Telephone Encounter (Signed)
FYI:   Patient called nurse triage line , stating she was having side effects from her covid booster she received on 02/12/21 , stated the symptoms started the exact same day. Her main concerns are:  Fatigue  Blurred vision Low temperature readings ( nothing over 97.5) Veins popping up in weird places   Patient also states she didn't take any of her medication on 02/13/21 but has taken aspirin, pt advised if symptoms became worse then go to ED , pt has an appointment on 02/15/21 to discuss issues.

## 2021-02-14 NOTE — Telephone Encounter (Signed)
Patient notified and verbalized understanding. 

## 2021-02-14 NOTE — Telephone Encounter (Signed)
urse Assessment Nurse: Doyle Askew, RN, Beth Date/Time (Eastern Time): 02/14/2021 1:50:53 PM Confirm and document reason for call. If symptomatic, describe symptoms. ---Caller states she got a Covid booster two days ago and is now having headaches, extremely weak (feeling better than yesterday). Caller states Tuesday symptoms were worse on Tuesday after booster. Does the patient have any new or worsening symptoms? ---Yes Will a triage be completed? ---Yes Related visit to physician within the last 2 weeks? ---No Does the PT have any chronic conditions? (i.e. diabetes, asthma, this includes High risk factors for pregnancy, etc.) ---Yes List chronic conditions. ---RA, asthma Is this a behavioral health or substance abuse call? ---No Guidelines Guideline Title Affirmed Question Affirmed Notes Nurse Date/Time (Eastern Time) COVID-19 - Vaccine Questions and Reactions [1] Fever > 101 F (38.3 C) AND [2] age > 60 years AND [3] started > 48 hours after getting vaccine Doyle Askew, RN, Beth 02/14/2021 1:55:18 PM Disp. Time Eilene Ghazi Time) Disposition Final User PLEASE NOTE: All timestamps contained within this report are represented as [place] Standard Time. CONFIDENTIALTY NOTICE: This fax transmission is intended only for the addressee. It contains information that is legally privileged, confidential or otherwise protected from use or disclosure. If you are not the intended recipient, you are strictly prohibited from reviewing, disclosing, copying using or disseminating any of this information or taking any action in reliance on or regarding this information. If you have received this fax in error, please notify us immediately by telephone so that we can arrange for its return to [place]. Phone: [phone], Toll-Free: [phone], Fax: [phone] Page: 2 of 2 Call Id: [id number] 02/14/2021 2:04:45 PM See HCP within 4 Hours (or PCP triage) Yes Doyle Askew, RN, Beth Caller Disagree/Comply Comply Caller  Understands Yes PreDisposition Call Doctor Care Advice Given Per Guideline * IF OFFICE WILL BE OPEN: You need to be seen within the next 3 or 4 hours. Call your doctor (or NP/PA) now or as soon as the office opens. SEE HCP (OR PCP TRIAGE) WITHIN 4 HOURS: FEVER MEDICINES: * For fevers above 101 F (38.3 C) take either acetaminophen or ibuprofen. * They are over-the-counter (OTC) drugs that help treat both fever and pain. You can buy them at the drugstore. CALL BACK IF: CARE ADVICE given per COVID-19 - Vaccine Questions and Reactions (Adult) guideline. * You become worse * IF OFFICE WILL BE CLOSED AND NO PCP (PRIMARY CARE PROVIDER) SECOND-LEVEL TRIAGE: You need to be seen within the next 3 or 4 hours. A nearby Urgent Care Center [facility]) is often a good source of care. Another choice is to go to the ED. Go sooner if you become worse. Comments User: Melene Muller, RN Date/Time Eilene Ghazi Time): 02/14/2021 1:54:39 PM current temp 97.1 User: Melene Muller, RN Date/Time Eilene Ghazi Time): 02/14/2021 2:03:38 PM Spoke with Jarrett Soho at office warm transferred caller to office for appt Referrals Warm transfer to backline

## 2021-02-15 ENCOUNTER — Ambulatory Visit (INDEPENDENT_AMBULATORY_CARE_PROVIDER_SITE_OTHER): Payer: Medicare Other | Admitting: Family

## 2021-02-15 ENCOUNTER — Other Ambulatory Visit: Payer: Self-pay

## 2021-02-15 VITALS — BP 140/84 | HR 85 | Temp 98.7°F | Resp 16 | Wt 177.0 lb

## 2021-02-15 DIAGNOSIS — T50B95A Adverse effect of other viral vaccines, initial encounter: Secondary | ICD-10-CM

## 2021-02-15 DIAGNOSIS — J4520 Mild intermittent asthma, uncomplicated: Secondary | ICD-10-CM

## 2021-02-15 HISTORY — DX: Adverse effect of other viral vaccines, initial encounter: T50.B95A

## 2021-02-15 NOTE — Assessment & Plan Note (Signed)
Advised pt that it sounds like she had a vigorous immune reaction to the COVID vaccine. Her symptoms are stable/improving.  I do not think that she had an allergic reaction to the vaccine.  Reassurance provided.

## 2021-02-15 NOTE — Assessment & Plan Note (Signed)
Mild wheezing today, continue albuterol prn as well as Trellegy.

## 2021-02-15 NOTE — Progress Notes (Signed)
Subjective:   By signing my name below, I, Shehryar Baig, attest that this documentation has been prepared under the direction and in the presence of [name] NP. 02/15/2021      Patient ID: Sheryl [name], female    DOB: July 11, 1946, 75 y.o.   MRN: [id number]  Chief Complaint  Patient presents with   Medication Problem    Patient complains of possible reaction to Covid her moderna booster shot she received on 02-12-21.    HPI Patient is in today for a office visit. She complains of a bulging vein in her temples of her forehead followed by tightness in the area and a throbbing headache on the left side. She started feeling these symptoms since getting her most recent Covid-19 vaccine on Tuesday (02/12/2021). Her symptoms have since improved and her veins have shrunk and her headaches have decreased. Immediately following the vaccine she also felt fatigue and urgency later that day which shortly resolved. She is concerned if the headaches and bulging vein are indicative of a stroke.   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   [facility]   HERNIA REPAIR  10/23/2016   KNEE SURGERY  2006   arthroscopic left knee   KNEE SURGERY  2001   right knee    Family History  Problem Relation Age of Onset   Heart disease Other        CAD   Diabetes Other    Hyperlipidemia Other    Stroke Other     Social History   Socioeconomic History   Marital status: Single    Spouse name: Not on file   Number of children: 1   Years of education: Not on file   Highest education level: Not on file  Occupational History   Occupation: unemployed    Employer: DISABLED  Tobacco Use   Smoking status: Some Days    Pack years: 0.00    Types: Cigarettes   Smokeless tobacco: Never   Tobacco comments:    smokes occasionally but not everyday  Substance and Sexual Activity   Alcohol use: Yes    Alcohol/week: 1.0 standard drink    Types: 1 Glasses of wine per week    Comment: occasionally   Drug use: Yes    Comment: Smokes "medical grade" marijuana 2 puffs 3 x weekly for rheumatoid arthritis.per pt   Sexual  activity: Never  Other Topics Concern   Not on file  Social History Narrative   Regular exercise:  Stretching exercises. Resistance bands   Caffeine: 1 mug (2cus) daily.      Social Determinants of Health   Financial Resource Strain: Low Risk    Difficulty of Paying Living Expenses: Not hard at all  Food Insecurity: No Food Insecurity   Worried About Charity fundraiser in the Last Year: Never true   [place] in the Last Year: Never true  Transportation Needs: No Transportation Needs   Lack of Transportation (Medical): No   Lack of Transportation (Non-Medical): No  Physical Activity: Sufficiently Active   Days of Exercise per Week: 7 days   Minutes of Exercise per Session: 30 min  Stress: No Stress Concern Present   Feeling of Stress : Not at all  Social Connections: Moderately Isolated   Frequency of Communication with  Friends and Family: More than three times a week   Frequency of Social Gatherings with Friends and Family: Once a week   Attends Religious Services: Never   Marine scientist or Organizations: Yes   Attends Music therapist: 1 to 4 times per year   Marital Status: Divorced  Human resources officer Violence: Not At Risk   Fear of Current or Ex-Partner: No   Emotionally Abused: No   Physically Abused: No   Sexually Abused: No    Outpatient Medications Prior to Visit  Medication Sig Dispense Refill   albuterol (VENTOLIN HFA) 108 (90 Base) MCG/ACT inhaler Inhale 2 puffs into the lungs every 6 (six) hours as needed for wheezing or shortness of breath. 54 g 1   amLODipine (NORVASC) 5 MG tablet Take 1 tablet (5 mg total) by mouth daily. 30 tablet 3   amLODipine (NORVASC) 5 MG tablet TAKE 1 TABLET BY MOUTH ONCE DAILY 30 tablet 3   aspirin 81 MG tablet Take 81 mg by mouth daily.     betamethasone dipropionate 0.05 % cream APPLY TOPICALLY TWO TIMES ADAY 30 g 1   clindamycin (CLEOCIN) 150 MG capsule TAKE 3 CAPSULES BY MOUTH 3 TIMES DAILY FOR 7 DAYS 63 capsule 0   clindamycin (CLEOCIN) 150 MG capsule TAKE 2 CAPSULES BY MOUTH ONE HOUR PRIOR TO DENTAL APPOINTMENT 6 capsule 0   estradiol (ESTRACE VAGINAL) 0.1 MG/GM vaginal cream 2g daily intravaginally for 1 week, then reduce to 1 gram PV daily for 1 week , followed by a maintenance dose of 1 g PV once weekly 42.5 g 5   fluticasone (FLONASE) 50 MCG/ACT nasal spray Place 2 sprays into both nostrils daily as needed. 29.7 g 1   levothyroxine (SYNTHROID) 100 MCG tablet TAKE 1 TABLET BY MOUTH DAILY 90 tablet 1   montelukast (SINGULAIR) 10 MG tablet TAKE 1 TABLET AT BEDTIME 90 tablet 1   multivitamin (THERAGRAN) per tablet Take 1 tablet by mouth daily.     NON FORMULARY TUMERIC MILK.     NON FORMULARY CBD. Derivative of marijuana without the marijuana.     nystatin (MYCOSTATIN/NYSTOP) powder APPLY ON TO THE SKIN 4 TIMES DAILY 30 g 1    pantoprazole (PROTONIX) 40 MG tablet Take 1 tablet (40 mg total) by mouth daily. 90 tablet 3   predniSONE (DELTASONE) 10 MG tablet TAKE 4 TABLETS BY MOUTH ONCE DAILY FOR 2 DAYS, THEN 3 TABLETS DAILY FOR 2 DAYS, THEN 2 TABLETS DAILY FOR 2 DAYS, THEN 1 TABLET DAILY FOR 2 DAYS 20 tablet 0  Probiotic Product (PROBIOTIC DAILY PO) Take 1 capsule by mouth daily.     traZODone (DESYREL) 50 MG tablet 1/2 to 1 tab by mouth at bedtime as needed for insomnia 30 tablet 1   TRELEGY ELLIPTA 100-62.5-25 MCG/INH AEPB USE 1 INHALATION ORALLY    DAILY 1 each 5   VOLTAREN 1 % GEL APPLY 2 GRAMS TOPICALLY 4 TIMES AS NEEDED 100 g 2   No facility-administered medications prior to visit.    Allergies  Allergen Reactions   Levofloxacin Shortness Of Breath and Rash   Mold Extract [Trichophyton] Anaphylaxis   Ativan [Lorazepam]     Visual hallucinations.  Visual hallucinations.    Cefuroxime Axetil     REACTION: asthma, cough   Hydrocodone Bit-Homatrop Mbr Nausea Only   Lisinopril     Diarrhea hives   Rosuvastatin Calcium     Muscle pain, syncope   Statins     Muscle pain, syncope   Zetia [Ezetimibe] Diarrhea   Hydrocodone-Acetaminophen Nausea Only    ROS     Objective:    Physical Exam Constitutional:      General: She is not in acute distress.    Appearance: Normal appearance. She is not ill-appearing.  HENT:     Head: Normocephalic and atraumatic.     Right Ear: External ear normal.     Left Ear: External ear normal.  Eyes:     Extraocular Movements: Extraocular movements intact.     Pupils: Pupils are equal, round, and reactive to light.  Cardiovascular:     Rate and Rhythm: Normal rate and regular rhythm.     Pulses: Normal pulses.     Heart sounds: Normal heart sounds. No murmur heard.   No gallop.     Comments: Prominent veins on bilateral temples. Non-tender. Pulmonary:     Effort: Pulmonary effort is normal. No respiratory distress.     Breath sounds: Wheezing (Bilateral  expiratory wheeze R>L) present. No rhonchi or rales.  Skin:    General: Skin is warm and dry.  Neurological:     Mental Status: She is alert and oriented to person, place, and time.  Psychiatric:        Behavior: Behavior normal.    BP 140/84 (BP Location: Right Arm, Patient Position: Sitting, Cuff Size: Small)   Pulse 85   Temp 98.7 F (37.1 C) (Oral)   Resp 16   Wt 177 lb (80.3 kg)   SpO2 98%   BMI 31.86 kg/m  Wt Readings from Last 3 Encounters:  02/15/21 177 lb (80.3 kg)  11/09/20 175 lb (79.4 kg)  11/02/20 175 lb (79.4 kg)       Assessment & Plan:   Problem List Items Addressed This Visit       Unprioritized   Asthma    Mild wheezing today, continue albuterol prn as well as Trellegy.       Adverse reaction to COVID-19 vaccine - Primary    Advised pt that it sounds like she had a vigorous immune reaction to the COVID vaccine. Her symptoms are stable/improving.  I do not think that she had an allergic reaction to the vaccine.  Reassurance provided.          No orders of the defined types were placed in this encounter.   I, [name] NP, personally preformed the services described in this documentation.  All medical record entries made by the scribe were at my direction and in my presence.  I have reviewed the  chart and discharge instructions (if applicable) and agree that the record reflects my personal performance and is accurate and complete. 02/15/2021   I,Shehryar Baig,acting as a Education administrator for [name], NP.,have documented all relevant documentation on the behalf of [name], NP,as directed by  [name], NP while in the presence of [name], NP.   [name], NP

## 2021-02-27 ENCOUNTER — Other Ambulatory Visit (HOSPITAL_BASED_OUTPATIENT_CLINIC_OR_DEPARTMENT_OTHER): Payer: Self-pay

## 2021-02-27 MED FILL — Nystatin Topical Powder 100000 Unit/GM: CUTANEOUS | 15 days supply | Qty: 30 | Fill #0 | Status: AC

## 2021-03-14 ENCOUNTER — Ambulatory Visit (INDEPENDENT_AMBULATORY_CARE_PROVIDER_SITE_OTHER): Payer: Medicare Other

## 2021-03-14 ENCOUNTER — Other Ambulatory Visit: Payer: Self-pay

## 2021-03-14 DIAGNOSIS — E538 Deficiency of other specified B group vitamins: Secondary | ICD-10-CM

## 2021-03-14 MED ORDER — CYANOCOBALAMIN 1000 MCG/ML IJ SOLN
1000.0000 ug | Freq: Once | INTRAMUSCULAR | Status: AC
  Administered 2021-03-14: 1000 ug via INTRAMUSCULAR

## 2021-03-14 NOTE — Progress Notes (Signed)
Sheryl [name] is a 75 y.o. female presents to the office today for monthly b12 injfection, per physician's orders.  cyanocobalmin (med), ,000 mcg (dose),  IM (route) was administered left deltoid (location) today. Patient tolerated injection.  Patient next injection due: one month, appt made Yes  Routed to DOD in absence of PCP.   [name]

## 2021-03-21 ENCOUNTER — Other Ambulatory Visit (HOSPITAL_BASED_OUTPATIENT_CLINIC_OR_DEPARTMENT_OTHER): Payer: Self-pay

## 2021-03-22 ENCOUNTER — Other Ambulatory Visit (HOSPITAL_BASED_OUTPATIENT_CLINIC_OR_DEPARTMENT_OTHER): Payer: Self-pay

## 2021-04-16 ENCOUNTER — Other Ambulatory Visit: Payer: Self-pay

## 2021-04-16 ENCOUNTER — Telehealth: Payer: Self-pay | Admitting: Family

## 2021-04-16 ENCOUNTER — Ambulatory Visit (INDEPENDENT_AMBULATORY_CARE_PROVIDER_SITE_OTHER): Payer: Medicare Other

## 2021-04-16 DIAGNOSIS — E538 Deficiency of other specified B group vitamins: Secondary | ICD-10-CM

## 2021-04-16 MED ORDER — CYANOCOBALAMIN 1000 MCG/ML IJ SOLN
1000.0000 ug | Freq: Once | INTRAMUSCULAR | Status: AC
  Administered 2021-04-16: 1000 ug via INTRAMUSCULAR

## 2021-04-16 NOTE — Telephone Encounter (Signed)
Patient brought in form to be filled out by [facility] Placed into MO bin up front  Patient would like for it to be mailed back to her house address in the envelope given with a stamp on it (patient provided)

## 2021-04-16 NOTE — Progress Notes (Signed)
Sheryl [name] is a 75 y.o. female presents to the office today for b12 injection per physician's orders. cyanocobalamin (med), 1,000 mcg (dose),  IM (route) was administered right deltoid (location) today. Patient tolerated injection. Patient next injection due: one month, appt made Yes  [name]

## 2021-04-16 NOTE — Telephone Encounter (Signed)
Form in provider's folder

## 2021-05-01 ENCOUNTER — Other Ambulatory Visit: Payer: Self-pay | Admitting: Family

## 2021-05-01 ENCOUNTER — Other Ambulatory Visit (HOSPITAL_BASED_OUTPATIENT_CLINIC_OR_DEPARTMENT_OTHER): Payer: Self-pay

## 2021-05-01 MED ORDER — AMLODIPINE BESYLATE 5 MG PO TABS
ORAL_TABLET | Freq: Every day | ORAL | 3 refills | Status: DC
  Filled 2021-05-01: qty 30, 30d supply, fill #0

## 2021-05-02 ENCOUNTER — Other Ambulatory Visit: Payer: Self-pay | Admitting: Pulmonary Disease

## 2021-05-02 ENCOUNTER — Other Ambulatory Visit: Payer: Self-pay | Admitting: Family

## 2021-05-10 ENCOUNTER — Other Ambulatory Visit (HOSPITAL_BASED_OUTPATIENT_CLINIC_OR_DEPARTMENT_OTHER): Payer: Self-pay | Admitting: Family

## 2021-05-10 DIAGNOSIS — Z1231 Encounter for screening mammogram for malignant neoplasm of breast: Secondary | ICD-10-CM

## 2021-05-17 ENCOUNTER — Ambulatory Visit: Payer: Medicare Other

## 2021-05-17 ENCOUNTER — Other Ambulatory Visit: Payer: Self-pay

## 2021-05-17 ENCOUNTER — Ambulatory Visit (HOSPITAL_BASED_OUTPATIENT_CLINIC_OR_DEPARTMENT_OTHER)
Admission: RE | Admit: 2021-05-17 | Discharge: 2021-05-17 | Disposition: A | Discharge status: Home / Self Care | Payer: Medicare Other | Source: Ambulatory Visit | Attending: Family | Admitting: Family

## 2021-05-17 ENCOUNTER — Encounter: Payer: Self-pay | Admitting: Family

## 2021-05-17 ENCOUNTER — Other Ambulatory Visit (HOSPITAL_BASED_OUTPATIENT_CLINIC_OR_DEPARTMENT_OTHER): Payer: Self-pay

## 2021-05-17 ENCOUNTER — Ambulatory Visit (INDEPENDENT_AMBULATORY_CARE_PROVIDER_SITE_OTHER): Payer: Medicare Other | Admitting: Family

## 2021-05-17 VITALS — BP 152/74 | HR 80 | Temp 98.4°F | Resp 16 | Wt 178.0 lb

## 2021-05-17 DIAGNOSIS — E538 Deficiency of other specified B group vitamins: Secondary | ICD-10-CM | POA: Insufficient documentation

## 2021-05-17 DIAGNOSIS — Z85038 Personal history of other malignant neoplasm of large intestine: Secondary | ICD-10-CM | POA: Diagnosis not present

## 2021-05-17 DIAGNOSIS — J40 Bronchitis, not specified as acute or chronic: Secondary | ICD-10-CM

## 2021-05-17 DIAGNOSIS — E039 Hypothyroidism, unspecified: Secondary | ICD-10-CM | POA: Diagnosis not present

## 2021-05-17 DIAGNOSIS — Z23 Encounter for immunization: Secondary | ICD-10-CM | POA: Diagnosis not present

## 2021-05-17 DIAGNOSIS — E119 Type 2 diabetes mellitus without complications: Secondary | ICD-10-CM

## 2021-05-17 DIAGNOSIS — J439 Emphysema, unspecified: Secondary | ICD-10-CM | POA: Diagnosis not present

## 2021-05-17 DIAGNOSIS — I10 Essential (primary) hypertension: Secondary | ICD-10-CM

## 2021-05-17 HISTORY — DX: Deficiency of other specified B group vitamins: E53.8

## 2021-05-17 LAB — COMPREHENSIVE METABOLIC PANEL
ALT: 29 U/L (ref 0–35)
AST: 37 U/L (ref 0–37)
Albumin: 3.7 g/dL (ref 3.5–5.2)
Alkaline Phosphatase: 96 U/L (ref 39–117)
BUN: 12 mg/dL (ref 6–23)
CO2: 29 mEq/L (ref 19–32)
Calcium: 8.9 mg/dL (ref 8.4–10.5)
Chloride: 99 mEq/L (ref 96–112)
Creatinine, Ser: 0.83 mg/dL (ref 0.40–1.20)
GFR: 69.69 mL/min (ref >60.00)
Glucose, Bld: 115 mg/dL — ABNORMAL HIGH (ref 70–99)
Potassium: 4.4 mEq/L (ref 3.5–5.1)
Sodium: 135 mEq/L (ref 135–145)
Total Bilirubin: 0.7 mg/dL (ref 0.2–1.2)
Total Protein: 8 g/dL (ref 6.0–8.3)

## 2021-05-17 LAB — HEMOGLOBIN A1C: Hgb A1c MFr Bld: 6.4 % (ref 4.6–6.5)

## 2021-05-17 LAB — TSH: TSH: 1.89 u[IU]/mL (ref 0.35–5.50)

## 2021-05-17 MED ORDER — FLUTICASONE PROPIONATE 50 MCG/ACT NA SUSP: 2.0000 | Freq: Every day | NASAL | 1 refills | Status: DC | PRN

## 2021-05-17 MED ORDER — LEVOTHYROXINE SODIUM 100 MCG PO TABS: 100.0000 ug | ORAL_TABLET | Freq: Every day | ORAL | 1 refills | Status: DC

## 2021-05-17 MED ORDER — TRELEGY ELLIPTA 100-62.5-25 MCG/INH IN AEPB
INHALATION_SPRAY | RESPIRATORY_TRACT | 5 refills | Status: DC
  Filled 2021-05-17: qty 60, 30d supply, fill #0

## 2021-05-17 MED ORDER — FLUTICASONE PROPIONATE 50 MCG/ACT NA SUSP
2.0000 | Freq: Every day | NASAL | 1 refills | Status: DC | PRN
  Filled 2021-05-17: qty 29.7, fill #0

## 2021-05-17 MED ORDER — CYANOCOBALAMIN 1000 MCG/ML IJ SOLN
1000.0000 ug | Freq: Once | INTRAMUSCULAR | Status: AC
  Administered 2021-05-17: 1000 ug via INTRAMUSCULAR

## 2021-05-17 MED ORDER — LEVOTHYROXINE SODIUM 100 MCG PO TABS
100.0000 ug | ORAL_TABLET | Freq: Every day | ORAL | 1 refills | Status: DC
  Filled 2021-05-17: qty 90, 90d supply, fill #0

## 2021-05-17 MED ORDER — AMLODIPINE BESYLATE 5 MG PO TABS
ORAL_TABLET | Freq: Every day | ORAL | 1 refills | Status: DC
  Filled 2021-05-17: qty 90, fill #0

## 2021-05-17 MED ORDER — MONTELUKAST SODIUM 10 MG PO TABS
10.0000 mg | ORAL_TABLET | Freq: Every day | ORAL | 1 refills | Status: DC
  Filled 2021-05-17: qty 90, 90d supply, fill #0

## 2021-05-17 MED ORDER — AMLODIPINE BESYLATE 5 MG PO TABS: ORAL_TABLET | Freq: Every day | ORAL | 1 refills | Status: DC

## 2021-05-17 MED ORDER — ALBUTEROL SULFATE HFA 108 (90 BASE) MCG/ACT IN AERS
2.0000 | INHALATION_SPRAY | Freq: Four times a day (QID) | RESPIRATORY_TRACT | 1 refills | Status: DC | PRN
  Filled 2021-05-17: qty 54, 75d supply, fill #0

## 2021-05-17 MED ORDER — MONTELUKAST SODIUM 10 MG PO TABS: 10.0000 mg | ORAL_TABLET | Freq: Every day | ORAL | 1 refills | Status: DC

## 2021-05-17 MED ORDER — PREDNISONE 10 MG PO TABS
ORAL_TABLET | ORAL | 0 refills | Status: DC
  Filled 2021-05-17: qty 20, 8d supply, fill #0

## 2021-05-17 NOTE — Assessment & Plan Note (Signed)
Lab Results  Component Value Date   HGBA1C 6.2 08/10/2020   Clinically stable. Obtain follow up A1C.

## 2021-05-17 NOTE — Patient Instructions (Signed)
Please complete lab work prior to leaving.   

## 2021-05-17 NOTE — Assessment & Plan Note (Signed)
b12 injection today.  

## 2021-05-17 NOTE — Assessment & Plan Note (Addendum)
Lab Results  Component Value Date   TSH 2.03 08/10/2020   Clinically stable on synthroid 183mcg once daily. Obtain follow up TSH.

## 2021-05-17 NOTE — Assessment & Plan Note (Signed)
She is requesting another prednisone taper due to recent wheezing and tightness in her lungs. Rx sent. In addition will obtain a CXR to rule out PNA.

## 2021-05-17 NOTE — Progress Notes (Signed)
Subjective:   By signing my name below, I, [name], attest that this documentation has been prepared under the direction and in the presence of [name], NP, 05/17/2021   Patient ID: Sheryl [name], female    DOB: Dec 26, 1945, 75 y.o.   MRN: [id number]  Chief Complaint  Patient presents with   Hypertension    Here for follow up   Diabetes    Here for follow up    HPI Patient is in today for an office visit.  Respiratory concern: She complains of breathing concerns after visiting [place] a month ago. She was taking prednisone to help with her symptoms. She was also taking robitussin to help her loosen up and cough up the mucus. She notes the mucus is not infectious looking but has a milky appearance. She notes she has a history of intense allergies that are especially worse when she visits [place]. She notes that it has really affected her breathing.  Dizziness: She notes that she has been experiencing dizziness episodes. She notes she is well hydrated and it does not just happen when she has a low blood pressure reading.  Inflammation: She notes she has been feeling very inflamed lately and she has a history of rheumatoid arthritis.  Skin concern: She complains of skin pain on her right elbow and under the bicep of her right arm. She notes she once used a powder to help with this issue and is interested in continuing this.  Blood pressure: She presents today with a blood pressure log that she takes a reading every 2 days. She notes her systolic usually remains around 130. She is compliant with taking her 5 mg amlodipine.  Yearly cancer screening: She would like to get a yearly cancer screening that another provider has requested she gets yearly.  Medication: She is requesting to have all of her medications refilled during this visit due to complications with her pharmacy. She notes she has been using albuterol for emergency use.   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   DILATION AND CURETTAGE OF UTERUS  1975   HERNIA REPAIR  10/23/2016   KNEE SURGERY  2006   arthroscopic left knee   KNEE SURGERY  2001   right knee    Family History  Problem Relation Age of Onset  Heart disease Other        CAD   Diabetes Other    Hyperlipidemia Other    Stroke Other     Social History   Socioeconomic History   Marital status: Single    Spouse name: Not on file   Number of children: 1   Years of education: Not on file   Highest education level: Not on file  Occupational History   Occupation: unemployed    Employer: DISABLED  Tobacco Use   Smoking status: Some Days    Types: Cigarettes   Smokeless tobacco: Never   Tobacco comments:    smokes occasionally but not everyday  Substance and Sexual Activity   Alcohol use: Yes    Alcohol/week: 1.0 standard drink    Types: 1 Glasses of wine per week    Comment: occasionally   Drug use: Yes    Comment:  Smokes "medical grade" marijuana 2 puffs 3 x weekly for rheumatoid arthritis.per pt   Sexual activity: Never  Other Topics Concern   Not on file  Social History Narrative   Regular exercise:  Stretching exercises. Resistance bands   Caffeine: 1 mug (2cus) daily.      Social Determinants of Health   Financial Resource Strain: Low Risk    Difficulty of Paying Living Expenses: Not hard at all  Food Insecurity: No Food Insecurity   Worried About Charity fundraiser in the Last Year: Never true   [place] in the Last Year: Never true  Transportation Needs: No Transportation Needs   Lack of Transportation (Medical): No   Lack of Transportation (Non-Medical): No  Physical Activity: Sufficiently Active   Days of Exercise per Week: 7 days   Minutes of Exercise per Session: 30 min  Stress: No Stress Concern Present   Feeling of Stress : Not at all  Social Connections: Moderately Isolated   Frequency of Communication with Friends and Family: More than three times a week   Frequency of Social Gatherings with Friends and Family: Once a week   Attends Religious Services: Never   Marine scientist or Organizations: Yes   Attends Music therapist: 1 to 4 times per year   Marital Status: Divorced  Human resources officer Violence: Not At Risk   Fear of Current or Ex-Partner: No   Emotionally Abused: No   Physically Abused: No   Sexually Abused: No    Outpatient Medications Prior to Visit  Medication Sig Dispense Refill   aspirin 81 MG tablet Take 81 mg by mouth daily.     betamethasone dipropionate 0.05 % cream APPLY TOPICALLY TWO TIMES ADAY 30 g 1   estradiol (ESTRACE VAGINAL) 0.1 MG/GM vaginal cream 2g daily intravaginally for 1 week, then reduce to 1 gram PV daily for 1 week , followed by a maintenance dose of 1 g PV once weekly 42.5 g 5   multivitamin (THERAGRAN) per tablet Take 1 tablet by mouth daily.     NON FORMULARY TUMERIC MILK.     NON FORMULARY CBD.  Derivative of marijuana without the marijuana.     nystatin (MYCOSTATIN/NYSTOP) powder APPLY ON TO THE SKIN 4 TIMES DAILY 30 g 1   pantoprazole (PROTONIX) 40 MG tablet Take 1 tablet (40 mg total) by mouth daily. 90 tablet 3   Probiotic Product (PROBIOTIC DAILY PO) Take 1 capsule by mouth daily.     traZODone (DESYREL) 50 MG tablet 1/2 to 1 tab by mouth at bedtime as  needed for insomnia 30 tablet 1   VOLTAREN 1 % GEL APPLY 2 GRAMS TOPICALLY 4 TIMES AS NEEDED 100 g 2   albuterol (VENTOLIN HFA) 108 (90 Base) MCG/ACT inhaler Inhale 2 puffs into the lungs every 6 (six) hours as needed for wheezing or shortness of breath. 54 g 1   amLODipine (NORVASC) 5 MG tablet TAKE 1 TABLET BY MOUTH ONCE DAILY 30 tablet 3   clindamycin (CLEOCIN) 150 MG capsule TAKE 3 CAPSULES BY MOUTH 3 TIMES DAILY FOR 7 DAYS 63 capsule 0   clindamycin (CLEOCIN) 150 MG capsule TAKE 2 CAPSULES BY MOUTH ONE HOUR PRIOR TO DENTAL APPOINTMENT 6 capsule 0   fluticasone (FLONASE) 50 MCG/ACT nasal spray Place 2 sprays into both nostrils daily as needed. 29.7 g 1   levothyroxine (SYNTHROID) 100 MCG tablet TAKE 1 TABLET BY MOUTH DAILY 90 tablet 1   montelukast (SINGULAIR) 10 MG tablet TAKE 1 TABLET AT BEDTIME 90 tablet 0   predniSONE (DELTASONE) 10 MG tablet TAKE 4 TABLETS BY MOUTH ONCE DAILY FOR 2 DAYS, THEN 3 TABLETS DAILY FOR 2 DAYS, THEN 2 TABLETS DAILY FOR 2 DAYS, THEN 1 TABLET DAILY FOR 2 DAYS 20 tablet 0   TRELEGY ELLIPTA 100-62.5-25 MCG/INH AEPB USE 1 INHALATION ORALLY    DAILY 1 each 5   No facility-administered medications prior to visit.    Allergies  Allergen Reactions   Levofloxacin Shortness Of Breath and Rash   Mold Extract [Trichophyton] Anaphylaxis   Ativan [Lorazepam]     Visual hallucinations.  Visual hallucinations.    Cefuroxime Axetil     REACTION: asthma, cough   Hydrocodone Bit-Homatrop Mbr Nausea Only   Lisinopril     Diarrhea hives   Rosuvastatin Calcium     Muscle pain, syncope   Statins     Muscle  pain, syncope   Zetia [Ezetimibe] Diarrhea   Hydrocodone-Acetaminophen Nausea Only    Review of Systems  Respiratory:  Positive for cough, sputum production and shortness of breath. Hemoptysis: milky appearance.  Neurological:  Positive for dizziness.      Objective:    Physical Exam Constitutional:      General: She is not in acute distress.    Appearance: Normal appearance. She is not ill-appearing.  HENT:     Head: Normocephalic and atraumatic.     Right Ear: External ear normal. There is impacted cerumen.     Left Ear: Ear canal and external ear normal.     Ears:     Comments: (+) mild clear fluid in left ear behind TM Eyes:     Extraocular Movements: Extraocular movements intact.     Pupils: Pupils are equal, round, and reactive to light.     Comments: (-) nystagmus  Cardiovascular:     Rate and Rhythm: Normal rate and regular rhythm.     Heart sounds: Normal heart sounds. No murmur heard.   No gallop.  Pulmonary:     Effort: Pulmonary effort is normal. No respiratory distress.     Breath sounds: Normal breath sounds. No wheezing or rales.  Lymphadenopathy:     Cervical: No cervical adenopathy.  Skin:    General: Skin is warm and dry.  Neurological:     Mental Status: She is alert and oriented to person, place, and time.  Psychiatric:        Behavior: Behavior normal.        Judgment: Judgment normal.    BP (!) 152/74 (BP Location: Right Arm, Patient Position:  Sitting, Cuff Size: Small)   Pulse 80   Temp 98.4 F (36.9 C) (Oral)   Resp 16   Wt 178 lb (80.7 kg)   SpO2 99%   BMI 32.04 kg/m  Wt Readings from Last 3 Encounters:  05/17/21 178 lb (80.7 kg)  02/15/21 177 lb (80.3 kg)  11/09/20 175 lb (79.4 kg)       Assessment & Plan:   Problem List Items Addressed This Visit       Unprioritized   Vitamin B12 deficiency    b12 injection today.       Hypothyroidism    Lab Results  Component Value Date   TSH 2.03 08/10/2020  Clinically stable on  synthroid 142mg once daily. Obtain follow up TSH.       Relevant Medications   levothyroxine (SYNTHROID) 100 MCG tablet   Other Relevant Orders   TSH   Hypertension    BP Readings from Last 3 Encounters:  05/17/21 (!) 152/74  02/15/21 140/84  11/09/20 (!) 148/92  BP mildly elevated despite amlodipine 551m Reports home bp is better.  sbp 95-134/58-78. Will not change medications at this time. She tends to run a higher BP in the office.       Relevant Medications   amLODipine (NORVASC) 5 MG tablet   Other Relevant Orders   Comp Met (CMET)   Diabetes type 2, controlled ([facility]   Lab Results  Component Value Date   HGBA1C 6.2 08/10/2020  Clinically stable. Obtain follow up A1C.       Relevant Orders   Hemoglobin A1c   Bronchitis    She is requesting another prednisone taper due to recent wheezing and tightness in her lungs. Rx sent. In addition will obtain a CXR to rule out PNA.       Relevant Orders   DG Chest 2 View   Other Visit Diagnoses     Needs flu shot    -  Primary   Relevant Orders   Flu Vaccine QUAD High Dose(Fluad) (Completed)   History of colon cancer       Relevant Orders   CEA      Meds ordered this encounter  Medications   cyanocobalamin ((VITAMIN B-12)) injection 1,000 mcg   predniSONE (DELTASONE) 10 MG tablet    Sig: TAKE 4 TABLETS BY MOUTH ONCE DAILY FOR 2 DAYS, THEN 3 TABLETS DAILY FOR 2 DAYS, THEN 2 TABLETS DAILY FOR 2 DAYS, THEN 1 TABLET DAILY FOR 2 DAYS    Dispense:  20 tablet    Refill:  0    Order Specific Question:   Supervising Provider    Answer:   BLPenni Homans [[id number]]   Fluticasone-Umeclidin-Vilant (TRELEGY ELLIPTA) 100-62.5-25 MCG/INH AEPB    Sig: Inhale 1 puff by mouth everyday    Dispense:  60 each    Refill:  5    Order Specific Question:   Supervising Provider    Answer:   BLPenni Homans [[id number]]   DISCONTD: amLODipine (NORVASC) 5 MG tablet    Sig: TAKE 1 TABLET BY MOUTH ONCE DAILY    Dispense:  90 tablet    Refill:  1     Order Specific Question:   Supervising Provider    Answer:   BLPenni Homans [[id number]]   albuterol (VENTOLIN HFA) 108 (90 Base) MCG/ACT inhaler    Sig: Inhale 2 puffs by mouth into the lungs every 6 (six) hours as needed for wheezing or shortness of breath.  Dispense:  54 g    Refill:  1    Order Specific Question:   Supervising Provider    Answer:   Penni Homans A [[id number]]   DISCONTD: fluticasone (FLONASE) 50 MCG/ACT nasal spray    Sig: Place 2 sprays into both nostrils daily as needed.    Dispense:  29.7 g    Refill:  1    Order Specific Question:   Supervising Provider    Answer:   Penni Homans A [[id number]]   DISCONTD: levothyroxine (SYNTHROID) 100 MCG tablet    Sig: TAKE 1 TABLET BY MOUTH DAILY    Dispense:  90 tablet    Refill:  1    Order Specific Question:   Supervising Provider    Answer:   Penni Homans A [[id number]]   DISCONTD: montelukast (SINGULAIR) 10 MG tablet    Sig: Take 1 tablet (10 mg total) by mouth at bedtime.    Dispense:  90 tablet    Refill:  1    Order Specific Question:   Supervising Provider    Answer:   Penni Homans A [[id number]]   amLODipine (NORVASC) 5 MG tablet    Sig: TAKE 1 TABLET BY MOUTH ONCE DAILY    Dispense:  90 tablet    Refill:  1    Order Specific Question:   Supervising Provider    Answer:   Penni Homans A [[id number]]   fluticasone (FLONASE) 50 MCG/ACT nasal spray    Sig: Place 2 sprays into both nostrils daily as needed.    Dispense:  29.7 g    Refill:  1    Order Specific Question:   Supervising Provider    Answer:   Penni Homans A [[id number]]   levothyroxine (SYNTHROID) 100 MCG tablet    Sig: TAKE 1 TABLET BY MOUTH DAILY    Dispense:  90 tablet    Refill:  1    Order Specific Question:   Supervising Provider    Answer:   Penni Homans A [[id number]]   montelukast (SINGULAIR) 10 MG tablet    Sig: Take 1 tablet (10 mg total) by mouth at bedtime.    Dispense:  90 tablet    Refill:  1    Order Specific Question:   Supervising Provider    Answer:   Penni Homans A [[id number]]   I, [name], NP, personally preformed the services described in this documentation.  All medical record entries made by the scribe were at my direction and in my presence.  I have reviewed the chart and discharge instructions (if applicable) and agree that the record reflects my personal performance and is accurate and complete. 05/17/2021  I,[name],acting as a scribe for [name], NP.,have documented all relevant documentation on the behalf of [name], NP,as directed by  [name], NP while in the presence of [name], NP.  [name], NP

## 2021-05-17 NOTE — Assessment & Plan Note (Addendum)
BP Readings from Last 3 Encounters:  05/17/21 (!) 152/74  02/15/21 140/84  11/09/20 (!) 148/92   BP mildly elevated despite amlodipine 5mg . Reports home bp is better.  sbp 95-134/58-78. Will not change medications at this time. She tends to run a higher BP in the office.

## 2021-05-18 LAB — CEA: CEA: 5.1 ng/mL — ABNORMAL HIGH

## 2021-05-21 ENCOUNTER — Other Ambulatory Visit (HOSPITAL_BASED_OUTPATIENT_CLINIC_OR_DEPARTMENT_OTHER): Payer: Self-pay

## 2021-05-21 ENCOUNTER — Other Ambulatory Visit: Payer: Self-pay | Admitting: Family

## 2021-05-21 MED ORDER — FLUTICASONE PROPIONATE 50 MCG/ACT NA SUSP
2.0000 | Freq: Every day | NASAL | 0 refills | Status: DC | PRN
  Filled 2021-05-21: qty 16, 30d supply, fill #0

## 2021-05-28 ENCOUNTER — Telehealth: Payer: Self-pay

## 2021-05-28 DIAGNOSIS — R42 Dizziness and giddiness: Secondary | ICD-10-CM

## 2021-05-28 NOTE — Telephone Encounter (Signed)
Pt called in follow up to last month's appt with PCP.  She stated she continues to have issues with the dizziness and she is concerned about this as she will be flying soon.  When she bends over to do something, or moves quickly, that is when it affects her the most.  She stated it was briefly touched on during her last visit that PT may be an option.  She stated she has been dealing with this since starting her new BP medication. Please advise.

## 2021-06-03 ENCOUNTER — Ambulatory Visit: Payer: Medicare Other | Attending: Family | Admitting: Physical Therapy

## 2021-06-03 ENCOUNTER — Other Ambulatory Visit: Payer: Self-pay

## 2021-06-03 ENCOUNTER — Encounter: Payer: Self-pay | Admitting: Physical Therapy

## 2021-06-03 DIAGNOSIS — R252 Cramp and spasm: Secondary | ICD-10-CM | POA: Insufficient documentation

## 2021-06-03 DIAGNOSIS — R293 Abnormal posture: Secondary | ICD-10-CM | POA: Diagnosis not present

## 2021-06-03 DIAGNOSIS — R42 Dizziness and giddiness: Secondary | ICD-10-CM | POA: Diagnosis not present

## 2021-06-03 DIAGNOSIS — M542 Cervicalgia: Secondary | ICD-10-CM | POA: Diagnosis not present

## 2021-06-03 NOTE — Therapy (Signed)
[facility] [address]  [place], [place], [address] Phone: [phone]   Fax:  [phone]  Physical Therapy Evaluation  Patient Details  Name: Sheryl [name] MRN: [id number] Date of Birth: 11-Sep-1945 Referring Provider (PT): [name]   Encounter Date: 06/03/2021   PT End of Session - 06/03/21 1715     Visit Number 1    Number of Visits 12    Date for PT Re-Evaluation 07/15/21    Authorization Type Medicare + supplement    Progress Note Due on Visit 10    PT Start Time 1615    PT Stop Time 1705    PT Time Calculation (min) 50 min    Activity Tolerance Patient tolerated treatment well    Behavior During Therapy [facility] for tasks assessed/performed             Past Medical History:  Diagnosis Date   Anemia    pernicious   Aortic ectasia ([place])    infrarenal abdomina aorta 2.6 cm   Asthma    Calculus of gallbladder 09/13/2018   Colon cancer ([place]) 2012   Diabetes mellitus    type 2   Fatty liver 01/19/2015   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   [place]   HERNIA REPAIR  10/23/2016   KNEE SURGERY  2006   arthroscopic left knee   KNEE SURGERY  2001   right knee    There were no vitals filed for this visit.    Subjective Assessment - 06/03/21 1621     Subjective Patient reports having vertigo that started when she started a new  blood pressure medication, but said her doctor didn't think so.  She reports some fluid in her L ear and also history of Bell's Palsy on L.  She gets dizziness when she looks up or down, then 2 weeks ago started having spinning when turning and looking down, another episode when rolling over.    Pertinent History HTN, bell's palsy, vertigo    Patient Stated Goals not feel dizzy, travel    Currently in Pain? Yes    Pain Score 3     Pain Location Neck    Pain Orientation Left    Pain Descriptors / Indicators Aching;Nagging    Pain Type Chronic pain                [facility] PT Assessment - 06/03/21 0001       Assessment   Medical Diagnosis R42 (ICD-10-CM) - Vertigo    Referring Provider (PT) Inda Castle, [name]    Onset Date/Surgical Date --   flared up 2 weeks ago     Precautions   Precautions None      Restrictions   Weight Bearing Restrictions No      Balance Screen   Has the patient fallen in the past 6 months  No    Has the patient had a decrease in activity level because of a fear of falling?  Yes   very careful fearful of falling.   Is the patient reluctant to leave their home because of a fear of falling?  No      Home Environment   Living Environment Private residence    Living Arrangements Alone    Type of [place] to enter    Entrance Stairs-Number of Steps 1    Many One level    [place] - single point    Additional Comments small bathroom does not feel unsafe      Prior Function   Level of Independence Independent    Vocation On disability    Leisure paint, draw, chair yoga                    Vestibular Assessment - 06/03/21 0001       Vestibular Assessment   General Observation enteres independently with no apparent distress      Symptom Behavior   Subjective history of current problem Patient reports having some vertigo after starting a new blood pressure medication, but then two weeks ago had  severe spinning when rolling over in bed, started [employer] and since started that haven't had any trouble at night.  Continues to have feelings of lightheadedness with head movements and difficulty with driving.  Has stopped driving due to fears of having another attack with spinning.    Type of Dizziness  Lightheadedness;"Funny feeling in head";Spinning    Frequency of Dizziness lightheaded all the time, spinning 2 episodes    Duration of Dizziness brief, <1 min    Symptom Nature Motion provoked;Positional    Aggravating Factors Turning head quickly;Forward bending;Sitting in moving car;Driving    Relieving Factors Head stationary    Progression of Symptoms Better      Oculomotor Exam   Oculomotor Alignment Normal    Ocular ROM WNL    Spontaneous Absent    Gaze-induced  Left beating nystagmus with L gaze    Head shaking Horizontal Comment   nauseous   Head Shaking Vertical Absent   slightly nauseous   Smooth Pursuits Intact    Saccades Intact      Vestibulo-Ocular Reflex   VOR 1 Head Only (x 1 viewing) given for HEP      Positional Testing   Dix-Hallpike Dix-Hallpike Right;Dix-Hallpike Left    Horizontal Canal Testing Horizontal Canal Right;Horizontal Canal Left      Dix-Hallpike Right   Dix-Hallpike Right Duration negative    Dix-Hallpike Right Symptoms No nystagmus      Dix-Hallpike Left   Dix-Hallpike Left Duration 2 seconds    Dix-Hallpike Left Symptoms No nystagmus      Horizontal Canal Right   Horizontal Canal Right Duration 0    Horizontal Canal Right Symptoms Normal      Horizontal Canal Left   Horizontal Canal Left Duration 0    Horizontal Canal Left Symptoms Normal      Cognition   Cognition Orientation Level Oriented x 4      Positional Sensitivities   Sit to Supine No dizziness    Supine to Left Side No dizziness    Supine to Right Side No dizziness    Supine to Sitting No dizziness    Right Hallpike No dizziness    Up from Right Hallpike No  dizziness  Up from Left Hallpike Lightheadedness    Nose to Right Knee No dizziness    Right Knee to Sitting Lightedness    Nose to Left Knee No dizziness    Left Knee to Sitting No dizziness    Head Turning x 5 Lightheadedness    Head Nodding x 5 No dizziness                Objective measurements completed on examination: See above findings.       [facility] Adult PT Treatment/Exercise - 06/03/21 0001       Self-Care   Self-Care Other Self-Care Comments    Other Self-Care Comments  see education.      Manual Therapy   Manual Therapy Soft tissue mobilization;Manual Traction;Joint mobilization    Manual therapy comments to cervical region in supine    Joint Mobilization PA mobs to C3-C5    Soft tissue mobilization STM to L cervical paraspinals    Manual Traction gentle brief holds, patient reports decreased pain                     PT Education - 06/03/21 1714     Education Details education on findings,  POC, initial HEP    Person(s) Educated Patient    Methods Explanation;Demonstration;Handout    Comprehension Verbalized understanding;Returned demonstration              PT Short Term Goals - 06/03/21 1735       PT SHORT TERM GOAL #1   Title Ind with initial HEP    Time 2    Period Weeks    Status New    Target Date 06/17/21      PT SHORT TERM GOAL #2   Title Pt. will participate in balance screening to rule out fall risk.    Time 2    Period Weeks    Status New    Target Date 07/15/21               PT Long Term Goals - 06/03/21 1735       PT LONG TERM GOAL #1   Title Ind with progressed HEP for postural strenthening to improve outcomes.    Time 6    Period Weeks    Status New    Target Date 07/15/21      PT LONG TERM GOAL #2   Title Pt. will report 75% improvement in overall vertigo symptoms with movement.    Time 6    Period Weeks    Status New    Target Date 07/15/21      PT LONG TERM GOAL #3   Title Pt. will  report 75% improvement in neck pain.    Time 6    Period Weeks    Status New    Target Date 07/15/21      PT LONG TERM GOAL #4   Title Pt. will be able to return to all activities without limitation from vertigo symptoms.    Time 6    Period Weeks    Status New    Target Date 07/15/21      PT LONG TERM GOAL #5   Title Pt. will demonstrate decreased fall risk with goal TBD based on balance testing.    Time 6    Period Weeks    Status New    Target Date 07/15/21  Plan - 06/03/21 1716     Clinical Impression Statement Myriah Boggus is a 75 year old female referred for vertigo.  She reports symptoms consistent with BPPV starting 2 weeks ago, with spinning when rolling over in bed, however she has been doing the [employer] on herself which helped her symptoms, and tests for BPPV were all negative today.  She also reports dizziness, nausea, and lightheadedness symptoms with head movements and driving.  Noted today she had significant tenderness/trigger points in L UT/levator/SCM, and symptoms could partly be due either cervigogenic causes or vestibular hypofunction.   She also reports symptoms that may be consistent with ocular migraine, may benefit from referral to neurologist.  She responded well to manual therapy to cervical region, reporting decreased pain, and was given VOR exercises.  She would benefit from continued skilled therapy.    Personal Factors and Comorbidities Age;Comorbidity 2    Comorbidities neck pain, HTN, RA, hypothyroidism, asthma, GERD    Examination-Activity Limitations Bend;Transfers;Bed Mobility    Examination-Participation Restrictions Driving;Cleaning    Stability/Clinical Decision Making Evolving/Moderate complexity    Clinical Decision Making Moderate    Rehab Potential Good    PT Frequency 2x / week    PT Duration 6 weeks    PT Treatment/Interventions ADLs/Self Care Home Management;Canalith  Repostioning;Cryotherapy;Electrical Stimulation;Moist Heat;Ultrasound;Traction;Therapeutic activities;Therapeutic exercise;Balance training;Neuromuscular re-education;Patient/family education;Passive range of motion;Dry needling;Vestibular;Visual/perceptual remediation/compensation;Spinal Manipulations;Joint Manipulations    PT Next Visit Plan check balance (DGI or Berg), progress vestibular exercises, brandt-daroff exercise    PT Home Exercise Plan Access Code: [email or web address]    Recommended Other Services neurology to rule out ocular migraine    Consulted and Agree with Plan of Care Patient             Patient will benefit from skilled therapeutic intervention in order to improve the following deficits and impairments:  Decreased activity tolerance, Dizziness, Pain, Decreased balance, Postural dysfunction, Increased muscle spasms  Visit Diagnosis: Dizziness and giddiness  Cervicalgia  Abnormal posture  Cramp and spasm     Problem List Patient Active Problem List   Diagnosis Date Noted   Vitamin B12 deficiency 05/17/2021   Adverse reaction to COVID-19 vaccine 02/15/2021   Ventral hernia 09/13/2018   Atherosclerotic vascular disease 09/13/2018   Overweight 09/13/2018   Mixed dyslipidemia 09/13/2018   Centrilobular emphysema ([place]) 01/29/2017   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   Anemia 06/04/2016   Ectatic aorta ([place]) 03/03/2016   Fatty liver 01/19/2015   Vaginal atrophy 01/08/2015   Bronchitis 09/23/2013   Colon cancer ([place]) 05/26/2012   Psoriasis 12/29/2011   Sjogren's syndrome ([place]) 03/17/2011   Insomnia 12/25/2010   General medical examination 12/25/2010   UNSPECIFIED VITAMIN D DEFICIENCY 05/15/2010   Asthma 12/26/2009   Diabetes type 2, controlled ([place]) 09/10/2009   Depression 09/10/2009   Allergic rhinitis 09/10/2009   Hypertension 09/10/2009   Hypothyroidism 06/05/2009   Hyperlipidemia 06/05/2009   PERNICIOUS ANEMIA 06/05/2009   History of tobacco abuse  06/05/2009   GERD 06/05/2009   Rheumatoid arthritis ([place]) 06/05/2009    Rennie Natter, PT, DPT 06/03/2021, 5:49 PM  [facility] [address]  [place], [place], [address] Phone: [phone]   Fax:  [phone]  Name: Sheryl [name] MRN: [id number] Date of Birth: [birth year]

## 2021-06-03 NOTE — Patient Instructions (Signed)
Access Code: [email or web address] URL: [email or web address] Date: 06/03/2021 Prepared by: Glenetta Hew  Exercises Seated Gaze Stabilization with Head Rotation - 1 x daily - 7 x weekly - 3 sets - 10 reps Seated Gaze Stabilization with Head Nod - 1 x daily - 7 x weekly - 3 sets - 10 reps

## 2021-06-04 ENCOUNTER — Ambulatory Visit (HOSPITAL_BASED_OUTPATIENT_CLINIC_OR_DEPARTMENT_OTHER)
Admission: RE | Admit: 2021-06-04 | Discharge: 2021-06-04 | Disposition: A | Discharge status: Home / Self Care | Payer: Medicare Other | Source: Ambulatory Visit | Attending: Family | Admitting: Family

## 2021-06-04 ENCOUNTER — Encounter (HOSPITAL_BASED_OUTPATIENT_CLINIC_OR_DEPARTMENT_OTHER): Payer: Self-pay

## 2021-06-04 DIAGNOSIS — Z1231 Encounter for screening mammogram for malignant neoplasm of breast: Secondary | ICD-10-CM | POA: Diagnosis not present

## 2021-06-07 ENCOUNTER — Encounter: Payer: Self-pay | Admitting: Physical Therapy

## 2021-06-07 ENCOUNTER — Ambulatory Visit: Payer: Medicare Other | Admitting: Physical Therapy

## 2021-06-07 ENCOUNTER — Other Ambulatory Visit: Payer: Self-pay

## 2021-06-07 DIAGNOSIS — R42 Dizziness and giddiness: Secondary | ICD-10-CM

## 2021-06-07 DIAGNOSIS — R293 Abnormal posture: Secondary | ICD-10-CM | POA: Diagnosis not present

## 2021-06-07 DIAGNOSIS — R252 Cramp and spasm: Secondary | ICD-10-CM | POA: Diagnosis not present

## 2021-06-07 DIAGNOSIS — M542 Cervicalgia: Secondary | ICD-10-CM | POA: Diagnosis not present

## 2021-06-07 NOTE — Therapy (Signed)
[facility] [address]  [place], [place], [address] Phone: [phone]   Fax:  [phone]  Physical Therapy Treatment  Patient Details  Name: Sheryl [name] MRN: [id number] Date of Birth: 08/07/1946 Referring Provider (PT): [name]   Encounter Date: 06/07/2021   PT End of Session - 06/07/21 1109     Visit Number 2    Number of Visits 12    Date for PT Re-Evaluation 07/15/21    Authorization Type Medicare + supplement    Progress Note Due on Visit 10    PT Start Time 1017    PT Stop Time 1100    PT Time Calculation (min) 43 min    Activity Tolerance Patient tolerated treatment well    Behavior During Therapy [facility] for tasks assessed/performed             Past Medical History:  Diagnosis Date   Anemia    pernicious   Aortic ectasia ([place])    infrarenal abdomina aorta 2.6 cm   Asthma    Calculus of gallbladder 09/13/2018   Colon cancer ([place]) 2012   Diabetes mellitus    type 2   Fatty liver 01/19/2015   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   [place]   HERNIA REPAIR  10/23/2016   KNEE SURGERY  2006   arthroscopic left knee   KNEE SURGERY  2001   right knee    There were no vitals filed for this visit.   Subjective Assessment - 06/07/21 1107     Subjective Patient reports her symptoms are much better and she has not had any  real dizzy episodes since initial evaluation.  She does report some difficulty with the VOR exercises.  She has started driving again.    Pertinent History HTN, bell's palsy, vertigo    Patient Stated Goals not feel dizzy, travel    Currently in Pain? Yes    Pain Score 2     Pain Location Neck    Pain Orientation Left                [facility] PT Assessment - 06/07/21 0001       Balance   Balance Assessed Yes      Standardized Balance Assessment   Standardized Balance Assessment Berg Balance Test      Berg Balance Test   Sit to Stand Able to stand without using hands and stabilize independently    Standing Unsupported Able to stand safely 2 minutes    Sitting with Back Unsupported but Feet Supported on Floor or Stool Able to sit safely and securely 2 minutes    Stand to Sit Sits safely with minimal use of hands    Transfers Able to transfer safely, minor use of hands    Standing Unsupported with Eyes Closed Able to stand 10 seconds safely  Standing Unsupported with Feet Together Able to place feet together independently and stand 1 minute safely    From Standing, Reach Forward with Outstretched Arm Can reach forward >12 cm safely (5")    From Standing Position, Pick up Object from Erin to pick up shoe safely and easily    From Standing Position, Turn to Look Behind Over each Shoulder Looks behind from both sides and weight shifts well    Turn 360 Degrees Able to turn 360 degrees safely in 4 seconds or less    Standing Unsupported, Alternately Place Feet on Step/Stool Able to stand independently and safely and complete 8 steps in 20 seconds    Standing Unsupported, One Foot in Front Able to plae foot ahead of the other independently and hold 30 seconds    Standing on One Leg Able to lift leg independently and hold 5-10 seconds    Total Score 53      Functional Gait  Assessment   Gait assessed  Yes    Gait Level Surface Walks 20 ft in less than 5.5 sec, no assistive  devices, good speed, no evidence for imbalance, normal gait pattern, deviates no more than 6 in outside of the 12 in walkway width.    Change in Gait Speed Able to smoothly change walking speed without loss of balance or gait deviation. Deviate no more than 6 in outside of the 12 in walkway width.    Gait with Horizontal Head Turns Performs head turns with moderate changes in gait velocity, slows down, deviates 10-15 in outside 12 in walkway width but recovers, can continue to walk.    Gait with Vertical Head Turns Performs task with slight change in gait velocity (eg, minor disruption to smooth gait path), deviates 6 - 10 in outside 12 in walkway width or uses assistive device    Gait and Pivot Turn Pivot turns safely within 3 sec and stops quickly with no loss of balance.    Step Over Obstacle Is able to step over one shoe box (4.5 in total height) without changing gait speed. No evidence of imbalance.    Gait with Narrow Base of Support Ambulates less than 4 steps heel to toe or cannot perform without assistance.    Gait with Eyes Closed Walks 20 ft, uses assistive device, slower speed, mild gait deviations, deviates 6-10 in outside 12 in walkway width. Ambulates 20 ft in less than 9 sec but greater than 7 sec.    Ambulating Backwards Walks 20 ft, no assistive devices, good speed, no evidence for imbalance, normal gait    Steps Alternating feet, must use rail.    Total Score 21                           [facility] Adult PT Treatment/Exercise - 06/07/21 0001       Manual Therapy   Manual Therapy Soft tissue mobilization;Manual Traction;Joint mobilization    Manual therapy comments to cervical region in supine    Joint Mobilization PA mobs to C3-C5    Soft tissue mobilization STM to L cervical paraspinals    Manual Traction gentle brief holds, patient reports decreased pain             Vestibular Treatment/Exercise - 06/07/21 0001       Vestibular Treatment/Exercise    Vestibular Treatment Provided Habituation    Habituation Exercises Seated Horizontal Head Turns;Seated Vertical Head Turns    Gaze Exercises  X1 Viewing Horizontal      Seated Horizontal Head Turns   Number of Reps  30    Symptom Description  minimal lightheadedness      Seated Vertical Head Turns   Number of Reps  30    Symptom Description  slight lightheadedness                      PT Short Term Goals - 06/07/21 1114       PT SHORT TERM GOAL #1   Title Ind with initial HEP    Time 2    Period Weeks    Status On-going   10/14- reviewed and corrected   Target Date 06/17/21      PT SHORT TERM GOAL #2   Title Pt. will participate in balance screening to rule out fall risk.    Time 2    Period Weeks    Status Achieved   DGI and Berg   Target Date 06/17/21               PT Long Term Goals - 06/07/21 1113       PT LONG TERM GOAL #1   Title Ind with progressed HEP for postural strenthening to improve outcomes.    Time 6    Period Weeks    Status On-going      PT LONG TERM GOAL #2   Title Pt. will report 75% improvement in overall vertigo symptoms with movement.    Time 6    Period Weeks    Status On-going   06/07/21- improving     PT LONG TERM GOAL #3   Title Pt. will report 75% improvement in neck pain.    Time 6    Period Weeks    Status On-going      PT LONG TERM GOAL #4   Title Pt. will be able to return to all activities without limitation from vertigo symptoms.    Time 6    Period Weeks    Status On-going   10/14- has returned to driving     PT LONG TERM GOAL #5   Title Pt. will demonstrate decreased fall risk with FGA of >25/30.    Baseline 21 indicates increased risk of falls in community dwelling adults.    Time 6    Period Weeks    Status On-going                   Plan - 06/07/21 1109     Clinical Impression Statement Sheryl [name] is making good progress and reporting decreased incidence and severity of dizziness.  She  was completing VOR exercises incorrectly, reviewed and had her sit with sticky note on wall rather than holding in hand as she had difficulty keeping hand still.  She was also moving head much too rapidly.  She participated in balance testing today - she scored 53/56 on Berg but 21/30 on FGA, demonstrating increased risk of falls.  She had immediate and large LOB with head turns on FGA.  She also demonstrated dypsnea with walking, complaining of asthma.  She reported decreased symptoms and improved breathing after manual therapy.  She would benefit from continued skilled therapy.    Personal Factors and Comorbidities Age;Comorbidity 2    Comorbidities neck pain, HTN, RA, hypothyroidism, asthma, GERD    Examination-Activity Limitations Bend;Transfers;Bed Mobility    Examination-Participation Restrictions Driving;Cleaning    Stability/Clinical Decision Making Evolving/Moderate complexity    Rehab Potential  Good    PT Frequency 2x / week    PT Duration 6 weeks    PT Treatment/Interventions ADLs/Self Care Home Management;Canalith Repostioning;Cryotherapy;Electrical Stimulation;Moist Heat;Ultrasound;Traction;Therapeutic activities;Therapeutic exercise;Balance training;Neuromuscular re-education;Patient/family education;Passive range of motion;Dry needling;Vestibular;Visual/perceptual remediation/compensation;Spinal Manipulations;Joint Manipulations    PT Next Visit Plan standing corner exercises for habituation    PT Home Exercise Plan Access Code: [email or web address]    Consulted and Agree with Plan of Care Patient             Patient will benefit from skilled therapeutic intervention in order to improve the following deficits and impairments:  Decreased activity tolerance, Dizziness, Pain, Decreased balance, Postural dysfunction, Increased muscle spasms  Visit Diagnosis: Dizziness and giddiness  Cervicalgia  Abnormal posture  Cramp and spasm     Problem List Patient Active Problem List    Diagnosis Date Noted   Vitamin B12 deficiency 05/17/2021   Adverse reaction to COVID-19 vaccine 02/15/2021   Ventral hernia 09/13/2018   Atherosclerotic vascular disease 09/13/2018   Overweight 09/13/2018   Mixed dyslipidemia 09/13/2018   Centrilobular emphysema ([place]) 01/29/2017   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   Anemia 06/04/2016   Ectatic aorta ([place]) 03/03/2016   Fatty liver 01/19/2015   Vaginal atrophy 01/08/2015   Bronchitis 09/23/2013   Colon cancer ([place]) 05/26/2012   Psoriasis 12/29/2011   Sjogren's syndrome ([place]) 03/17/2011   Insomnia 12/25/2010   General medical examination 12/25/2010   UNSPECIFIED VITAMIN D DEFICIENCY 05/15/2010   Asthma 12/26/2009   Diabetes type 2, controlled ([place]) 09/10/2009   Depression 09/10/2009   Allergic rhinitis 09/10/2009   Hypertension 09/10/2009   Hypothyroidism 06/05/2009   Hyperlipidemia 06/05/2009   PERNICIOUS ANEMIA 06/05/2009   History of tobacco abuse 06/05/2009   GERD 06/05/2009   Rheumatoid arthritis ([place]) 06/05/2009    Sheryl [name], PT, DPT 06/07/2021, 11:16 AM  [facility] [address]  [place], [place], [address] Phone: [phone]   Fax:  [phone]  Name: Sheryl [name] MRN: [id number] Date of Birth: 11-12-1945

## 2021-06-12 ENCOUNTER — Encounter: Payer: Self-pay | Admitting: Physical Therapy

## 2021-06-12 ENCOUNTER — Other Ambulatory Visit: Payer: Self-pay

## 2021-06-12 ENCOUNTER — Ambulatory Visit: Payer: Medicare Other | Admitting: Physical Therapy

## 2021-06-12 DIAGNOSIS — R293 Abnormal posture: Secondary | ICD-10-CM | POA: Diagnosis not present

## 2021-06-12 DIAGNOSIS — R252 Cramp and spasm: Secondary | ICD-10-CM

## 2021-06-12 DIAGNOSIS — M542 Cervicalgia: Secondary | ICD-10-CM | POA: Diagnosis not present

## 2021-06-12 DIAGNOSIS — R42 Dizziness and giddiness: Secondary | ICD-10-CM

## 2021-06-12 NOTE — Patient Instructions (Signed)
Access Code: [email or web address] URL: [email or web address] Date: 06/12/2021 Prepared by: [place] with Counter Support - 1 x daily - 7 x weekly - 3 sets - 10 reps Standing Hip Abduction with Counter Support - 1 x daily - 7 x weekly - 3 sets - 10 reps Standing Hip Extension with Counter Support - 1 x daily - 7 x weekly - 3 sets - 10 reps Mini Squat with Counter Support - 1 x daily - 7 x weekly - 3 sets - 10 reps Standing Tandem Balance with Counter Support - 1 x daily - 7 x weekly - 3 sets - 10 reps Standing Gaze Stabilization with Head Rotation - 2 x daily - 7 x weekly - 3 sets - 10 reps Standing Gaze Stabilization with Head Nod - 2 x daily - 7 x weekly - 3 sets - 10 reps

## 2021-06-12 NOTE — Therapy (Signed)
[facility] [address]  [place], [place], [address] Phone: [phone]   Fax:  [phone]  Physical Therapy Treatment  Patient Details  Name: Sheryl [name] MRN: [id number] Date of Birth: 1946-01-28 Referring Provider (PT): [name]   Encounter Date: 06/12/2021   PT End of Session - 06/12/21 1526     Visit Number 3    Number of Visits 12    Date for PT Re-Evaluation 07/15/21    Authorization Type Medicare + supplement    Progress Note Due on Visit 10    PT Start Time 0231    PT Stop Time 0315    PT Time Calculation (min) 44 min    Activity Tolerance Patient tolerated treatment well    Behavior During Therapy [facility] for tasks assessed/performed             Past Medical History:  Diagnosis Date   Anemia    pernicious   Aortic ectasia ([place])    infrarenal abdomina aorta 2.6 cm   Asthma    Calculus of gallbladder 09/13/2018   Colon cancer ([place]) 2012   Diabetes mellitus    type 2   Fatty liver 01/19/2015   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   Cut Off   HERNIA REPAIR  10/23/2016   KNEE SURGERY  2006   arthroscopic left knee   KNEE SURGERY  2001   right knee    There were no vitals filed for this visit.   Subjective Assessment - 06/12/21 1436     Subjective Patient reports started on prednisone for her asthma.  She reports her  vertigo continues to improve.  Reports her gait is not good, like she's "drunk without the cocktail."  Trying to walk for 6 minutes, as that is what she learned in another class.    Pertinent History HTN, bell's palsy, vertigo    Patient Stated Goals not feel dizzy, travel    Currently in Pain? No/denies                               [facility] Adult PT Treatment/Exercise - 06/12/21 0001       Exercises   Exercises Neck;Knee/Hip      Neck Exercises: Machines for Strengthening   UBE (Upper Arm Bike) L1 6 min (3 min foward/3 min back)      Knee/Hip Exercises: Standing   Heel Raises Both;2 sets;10 reps    Hip Abduction Stengthening;Both;2 sets;10 reps    Hip Extension Stengthening;Both;2 sets;10 reps    Functional Squat 2 sets;10 reps             Vestibular Treatment/Exercise - 06/12/21 0001       Vestibular Treatment/Exercise   Vestibular Treatment Provided Habituation    Habituation Exercises Standing Horizontal Head Turns;Standing Vertical  Head Turns    Gaze Exercises X1 Viewing Horizontal      Standing Horizontal Head Turns   Number of Reps  20    Symptom Description  none      Standing Vertical Head Turns   Number of Reps  20    Symptom Description  none                Balance Exercises - 06/12/21 0001       Balance Exercises: Standing   Standing Eyes Opened Narrow base of support (BOS);Head turns;Foam/compliant surface;Limitations    Standing Eyes Opened Limitations standing in corner with SBA for safety, feet together firm surface x 30 sec head nods x 10, head turns x 10, repeated on airex with feet apart    Standing Eyes Closed Narrow base of support (BOS);Head turns;Foam/compliant surface;Limitations    Standing Eyes Closed Limitations standing in corner with SBA for safety, feet together firm surface x 30 sec head nods x 10, head turns x 10, repeated on airex with feet apart    Tandem Stance Eyes open;Upper extremity support 1;30 secs     Heel Raises Both;20 reps    Other Standing Exercises foward step and reach x 10 bil with 1 UE support      OTAGO PROGRAM   Hip ABductor --   UE support on counter 2 x 10 bil               PT Education - 06/12/21 1520     Education Details Progressed exercises    Person(s) Educated Patient    Methods Explanation;Demonstration;Handout;Verbal cues    Comprehension Verbalized understanding;Returned demonstration;Need further instruction              PT Short Term Goals - 06/12/21 1530       PT SHORT TERM GOAL #1   Title Ind with initial HEP    Time 2    Period Weeks    Status Achieved   10/14- reviewed and corrected   Target Date 06/17/21      PT SHORT TERM GOAL #2   Title Pt. will participate in balance screening to rule out fall risk.    Time 2    Period Weeks    Status Achieved   DGI and Berg   Target Date 06/17/21               PT Long Term Goals - 06/12/21 1530       PT LONG TERM GOAL #1   Title Ind with progressed HEP for postural strenthening to improve outcomes.    Time 6    Period Weeks    Status On-going      PT LONG TERM GOAL #2   Title Pt. will report 75% improvement in overall vertigo symptoms with movement.    Time 6    Period Weeks    Status On-going   06/07/21- improving     PT LONG TERM GOAL #3   Title Pt. will report 75% improvement in neck pain.    Time 6    Period Weeks    Status On-going      PT LONG TERM GOAL #4   Title Pt. will be able to return to all activities without limitation from vertigo symptoms.    Time 6    Period Weeks    Status On-going   10/14- has returned to driving     PT LONG TERM GOAL #5   Title Pt. will  demonstrate decreased fall risk with FGA of >25/30.    Baseline 21 indicates increased risk of falls in community dwelling adults.    Time 6    Period Weeks    Status On-going                   Plan - 06/12/21 1527     Clinical Impression Statement Sheryl [name] reports continued  improvement in overall dizziness symptoms but reports more awareness now of difficulty with head turns and gait deviations.  With corner balance exercises noted no significatn increases in sway and no LOB with all conditions, and reported it felt easier with her eyes closed and head turns compared to eyes open with head turns.  We did advance her VOR exercise today to standing to continue to address this deficit, and also added ankle/hip strengthening exercises to improve balance reactions.  She would benefit from continued skilled therapy.    Personal Factors and Comorbidities Age;Comorbidity 2    Comorbidities neck pain, HTN, RA, hypothyroidism, asthma, GERD    Examination-Activity Limitations Bend;Transfers;Bed Mobility    Examination-Participation Restrictions Driving;Cleaning    Stability/Clinical Decision Making Evolving/Moderate complexity    Rehab Potential Good    PT Frequency 2x / week    PT Duration 6 weeks    PT Treatment/Interventions ADLs/Self Care Home Management;Canalith Repostioning;Cryotherapy;Electrical Stimulation;Moist Heat;Ultrasound;Traction;Therapeutic activities;Therapeutic exercise;Balance training;Neuromuscular re-education;Patient/family education;Passive range of motion;Dry needling;Vestibular;Visual/perceptual remediation/compensation;Spinal Manipulations;Joint Manipulations    PT Next Visit Plan standing corner exercises for habituation    PT Home Exercise Plan Access Code: [email or web address]    Consulted and Agree with Plan of Care Patient             Patient will benefit from skilled therapeutic intervention in order to improve the following deficits and impairments:  Decreased activity tolerance, Dizziness, Pain, Decreased balance, Postural dysfunction, Increased muscle spasms  Visit Diagnosis: Dizziness and giddiness  Cervicalgia  Abnormal posture  Cramp and spasm     Problem List Patient Active Problem List   Diagnosis Date Noted   Vitamin B12 deficiency  05/17/2021   Adverse reaction to COVID-19 vaccine 02/15/2021   Ventral hernia 09/13/2018   Atherosclerotic vascular disease 09/13/2018   Overweight 09/13/2018   Mixed dyslipidemia 09/13/2018   Centrilobular emphysema ([place]) 01/29/2017   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   Anemia 06/04/2016   Ectatic aorta ([place]) 03/03/2016   Fatty liver 01/19/2015   Vaginal atrophy 01/08/2015   Bronchitis 09/23/2013   Colon cancer ([place]) 05/26/2012   Psoriasis 12/29/2011   Sjogren's syndrome ([place]) 03/17/2011   Insomnia 12/25/2010   General medical examination 12/25/2010   UNSPECIFIED VITAMIN D DEFICIENCY 05/15/2010   Asthma 12/26/2009   Diabetes type 2, controlled ([place]) 09/10/2009   Depression 09/10/2009   Allergic rhinitis 09/10/2009   Hypertension 09/10/2009   Hypothyroidism 06/05/2009   Hyperlipidemia 06/05/2009   PERNICIOUS ANEMIA 06/05/2009   History of tobacco abuse 06/05/2009   GERD 06/05/2009   Rheumatoid arthritis ([place]) 06/05/2009    Rennie Natter, PT, DPT 06/12/2021, 3:31 PM  Sheryl [name] [facility] [address]  [place], [place], [address] Phone: [phone]   Fax:  [phone]  Name: HIDAYA DANIEL MRN: [id number] Date of Birth: 09-Jan-1946

## 2021-06-17 ENCOUNTER — Ambulatory Visit: Payer: Medicare Other | Admitting: Physical Therapy

## 2021-06-26 ENCOUNTER — Encounter: Payer: Medicare Other | Admitting: Physical Therapy

## 2021-06-28 ENCOUNTER — Ambulatory Visit: Payer: Medicare Other | Admitting: Physical Therapy

## 2021-07-02 ENCOUNTER — Ambulatory Visit: Payer: Medicare Other | Attending: Family | Admitting: Physical Therapy

## 2021-07-02 ENCOUNTER — Other Ambulatory Visit: Payer: Self-pay

## 2021-07-02 DIAGNOSIS — R293 Abnormal posture: Secondary | ICD-10-CM | POA: Insufficient documentation

## 2021-07-02 DIAGNOSIS — R252 Cramp and spasm: Secondary | ICD-10-CM | POA: Insufficient documentation

## 2021-07-02 DIAGNOSIS — M542 Cervicalgia: Secondary | ICD-10-CM | POA: Insufficient documentation

## 2021-07-02 DIAGNOSIS — R42 Dizziness and giddiness: Secondary | ICD-10-CM | POA: Insufficient documentation

## 2021-07-02 NOTE — Therapy (Addendum)
PHYSICAL THERAPY DISCHARGE SUMMARY  Visits from Start of Care: 4  Current functional level related to goals / functional outcomes: Last seen on 07/02/2021, at that time reported significant improvement in dizziness and resolution of neck pain.     Remaining deficits: Unsteadiness with gait   Education / Equipment: HEP  Plan:  Patient goals were not met. Patient became ill and was unable to return to PT after 07/02/2021.  She will need new order to return to PT.     Rennie Natter, PT, DPT 08/28/2021   [facility] [address]  [place], [place], [address] Phone: [phone]   Fax:  [phone]  Physical Therapy Treatment  Patient Details  Name: Sheryl [name] MRN: [id number] Date of Birth: 1945-11-11 Referring Provider (PT): [name]   Encounter Date: 07/02/2021   PT End of Session - 07/02/21 1320     Visit Number 4    Number of Visits 12    Date for PT Re-Evaluation 07/15/21    Authorization Type Medicare + supplement    Progress Note Due on Visit 10    PT Start Time 1317    PT Stop Time 1347    PT Time Calculation (min) 30 min    Activity Tolerance Patient tolerated treatment well    Behavior During Therapy [facility] for tasks assessed/performed             Past Medical History:  Diagnosis Date   Anemia    pernicious   Aortic ectasia ([place])    infrarenal abdomina aorta 2.6 cm   Asthma    Calculus of gallbladder 09/13/2018   Colon cancer ([place]) 2012   Diabetes mellitus    type 2   Fatty liver 01/19/2015   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   [place]   HERNIA REPAIR  10/23/2016   KNEE SURGERY  2006   arthroscopic left knee   KNEE SURGERY  2001   right knee    There were no vitals filed for this visit.   Subjective Assessment - 07/02/21 1318     Subjective Patient reports that she went on her trip, "it took everything I had" but didn't have any real dizziness, even went on whale watching trip.  Feels unsteady now mostly walking.   Having some trouble in L foot form arthritis and swelling.    Pertinent History HTN, bell's palsy, vertigo    Patient Stated Goals not feel dizzy, travel    Currently in Pain? No/denies   some in foot                               Vestibular Treatment/Exercise - 07/02/21 0001       Vestibular Treatment/Exercise   Vestibular Treatment Provided Habituation    Habituation Exercises  Standing Horizontal Head Turns;Standing Vertical Head Turns    Gaze Exercises X1 Viewing Horizontal      Standing Horizontal Head Turns   Number of Reps  30   3 x 10 with breaks between   Symptom Description  none      Standing Vertical Head Turns   Number of Reps  30   3 x 10 with breaks between   Symptom Description  none                Balance Exercises - 07/02/21 0001       Balance Exercises: Standing   Gait with Head Turns Forward;Limitations    Gait with Head Turns Limitations in hall way, horizontal x 50' with gaze fixed, vertical x 50' with gaze fixed, vertical again without gaze fixation x 50'                  PT Short Term Goals - 06/12/21 1530       PT SHORT TERM GOAL #1   Title Ind with initial HEP    Time 2    Period Weeks    Status Achieved   10/14- reviewed and corrected   Target Date 06/17/21      PT SHORT TERM GOAL #2    Title Pt. will participate in balance screening to rule out fall risk.    Time 2    Period Weeks    Status Achieved   DGI and Berg   Target Date 06/17/21               PT Long Term Goals - 07/02/21 1321       PT LONG TERM GOAL #1   Title Ind with progressed HEP for postural strenthening to improve outcomes.    Time 6    Period Weeks    Status On-going      PT LONG TERM GOAL #2   Title Pt. will report 75% improvement in overall vertigo symptoms with movement.    Time 6    Period Weeks    Status On-going   06/07/21- improving  07/02/21- still notes symptoms if moves too fast.     PT LONG TERM GOAL #3   Title Pt. will report 75% improvement in neck pain.    Time 6    Period Weeks    Status On-going   07/02/2021- reports neck pain has resolved.     PT LONG TERM GOAL #4   Title Pt. will be able to return to all activities without limitation from vertigo symptoms.    Time 6    Period Weeks    Status On-going   10/14- has returned to driving  38/8- no dizziness with flying, driving or on boat.  feels mostly "lightheaded and unsteady" now.     PT LONG TERM GOAL #5   Title Pt. will demonstrate decreased fall risk with FGA of >25/30.    Baseline 21 indicates increased risk of falls in community dwelling adults.    Time 6    Period Weeks    Status On-going                   Plan - 07/02/21 1348     Clinical Impression Statement Sheryl [name] reports minimal dizziness during trip and resolution of neck pain, but still feels generally unsteady when walking and still has trouble with driving.  She also reports exhaustion from the trip that may be contributing/exacerbating her symptoms.  Focused today  on head turns both with walking and standing.  She demonstrated more difficulty with gaze fixation in horizontal plane, with frequent overshoots.  Progressed VOR to patterned background in sitting, which she tolerated well although noted fatigue towards end of session.  She would  benefit from continued skilled therapy.    Personal Factors and Comorbidities Age;Comorbidity 2    Comorbidities neck pain, HTN, RA, hypothyroidism, asthma, GERD    Examination-Activity Limitations Bend;Transfers;Bed Mobility    Examination-Participation Restrictions Driving;Cleaning    Stability/Clinical Decision Making Evolving/Moderate complexity    Rehab Potential Good    PT Frequency 2x / week    PT Duration 6 weeks    PT Treatment/Interventions ADLs/Self Care Home Management;Canalith Repostioning;Cryotherapy;Electrical Stimulation;Moist Heat;Ultrasound;Traction;Therapeutic activities;Therapeutic exercise;Balance training;Neuromuscular re-education;Patient/family education;Passive range of motion;Dry needling;Vestibular;Visual/perceptual remediation/compensation;Spinal Manipulations;Joint Manipulations    PT Next Visit Plan standing corner exercises for habituation    PT Home Exercise Plan Access Code: [email or web address]    Consulted and Agree with Plan of Care Patient             Patient will benefit from skilled therapeutic intervention in order to improve the following deficits and impairments:  Decreased activity tolerance, Dizziness, Pain, Decreased balance, Postural dysfunction, Increased muscle spasms  Visit Diagnosis: Dizziness and giddiness  Cervicalgia  Abnormal posture  Cramp and spasm     Problem List Patient Active Problem List   Diagnosis Date Noted   Vitamin B12 deficiency 05/17/2021   Adverse reaction to COVID-19 vaccine 02/15/2021   Ventral hernia 09/13/2018   Atherosclerotic vascular disease 09/13/2018   Overweight 09/13/2018   Mixed dyslipidemia 09/13/2018   Centrilobular emphysema ([place]) 01/29/2017   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   Anemia 06/04/2016   Ectatic aorta ([place]) 03/03/2016   Fatty liver 01/19/2015   Vaginal atrophy 01/08/2015   Bronchitis 09/23/2013   Colon cancer ([place]) 05/26/2012   Psoriasis 12/29/2011   Sjogren's syndrome ([place])  03/17/2011   Insomnia 12/25/2010   General medical examination 12/25/2010   UNSPECIFIED VITAMIN D DEFICIENCY 05/15/2010   Asthma 12/26/2009   Diabetes type 2, controlled ([place]) 09/10/2009   Depression 09/10/2009   Allergic rhinitis 09/10/2009   Hypertension 09/10/2009   Hypothyroidism 06/05/2009   Hyperlipidemia 06/05/2009   PERNICIOUS ANEMIA 06/05/2009   History of tobacco abuse 06/05/2009   GERD 06/05/2009   Rheumatoid arthritis ([place]) 06/05/2009    Rennie Natter, PT, DPT 07/02/2021, 1:53 PM  [facility] [address]  [place], [place], [address] Phone: [phone]   Fax:  [phone]  Name: Sheryl [name] MRN: [id number] Date of Birth: 08-03-1946

## 2021-07-04 ENCOUNTER — Telehealth (INDEPENDENT_AMBULATORY_CARE_PROVIDER_SITE_OTHER): Payer: Medicare Other | Admitting: Family Medicine

## 2021-07-04 ENCOUNTER — Other Ambulatory Visit (HOSPITAL_BASED_OUTPATIENT_CLINIC_OR_DEPARTMENT_OTHER): Payer: Self-pay

## 2021-07-04 DIAGNOSIS — R0981 Nasal congestion: Secondary | ICD-10-CM

## 2021-07-04 MED ORDER — DOXYCYCLINE HYCLATE 100 MG PO TABS
100.0000 mg | ORAL_TABLET | Freq: Two times a day (BID) | ORAL | 0 refills | Status: DC
  Filled 2021-07-04: qty 20, 10d supply, fill #0

## 2021-07-04 NOTE — Patient Instructions (Signed)
-  I sent the medication(s) we discussed to your pharmacy: Meds ordered this encounter  Medications   doxycycline (VIBRA-TABS) 100 MG tablet    Sig: Take 1 tablet (100 mg total) by mouth 2 (two) times daily.    Dispense:  20 tablet    Refill:  0   Use your albuterol every 6 hours as needed.  I hope you are feeling better soon!  Seek in person care promptly if your symptoms worsen, new concerns arise or you are not improving with treatment.  It was nice to meet you today. I help [facility] out with telemedicine visits on Tuesdays and Thursdays and am available for visits on those days. If you have any concerns or questions following this visit please schedule a follow up visit with your Primary Care doctor or seek care at a local urgent care clinic to avoid delays in care.

## 2021-07-04 NOTE — Progress Notes (Signed)
Virtual Visit via Video Note  I connected with Sheryl [name]  on 07/04/21 at  4:20 PM EST by a video enabled telemedicine application and verified that I am speaking with the correct person using two identifiers.  Location patient: home, [place] Location provider:work or home office Persons participating in the virtual visit: patient, provider  I discussed the limitations of evaluation and management by telemedicine and the availability of in person appointments. The patient expressed understanding and agreed to proceed.   HPI:  Acute telemedicine visit for congestion and cough: -Onset: chronic congestion this time of the year with mold allergies, worsening the last 3 days or so -covid test today was negative -Symptoms include: nasal congestion, thick sinus congestion, bad taste in the mucus, some asthma symptoms and is using her albuterol which helps - but can not use it a lot as it makes her jittery -she doesn't feel like needs prednisone but feels needs antibiotic -Denies: fevers, CP, body aches, NVD -Pertinent past medical history: has a history of allergies; hx of sinusitis -Pertinent medication allergies:  Allergies  Allergen Reactions   Levofloxacin Shortness Of Breath and Rash   Mold Extract [Trichophyton] Anaphylaxis   Ativan [Lorazepam]     Visual hallucinations.  Visual hallucinations.    Cefuroxime Axetil     REACTION: asthma, cough   Hydrocodone Bit-Homatrop Mbr Nausea Only   Lisinopril     Diarrhea hives   Rosuvastatin Calcium     Muscle pain, syncope   Statins     Muscle pain, syncope   Zetia [Ezetimibe] Diarrhea   Hydrocodone-Acetaminophen Nausea Only  -COVID-19 vaccine status:  Immunization History  Administered Date(s) Administered   Fluad Quad(high Dose 65+) 04/29/2019, 05/11/2020, 05/17/2021   Influenza Split 07/07/2011   Influenza Whole 06/05/2009, 04/24/2010   Influenza, High Dose Seasonal PF 06/29/2015, 04/30/2016, 05/19/2017, 05/24/2018   Influenza,inj,Quad  PF,6+ Mos 06/01/2013, 05/25/2014   Moderna SARS-COV2 Booster Vaccination 02/12/2021   Moderna Sars-Covid-2 Vaccination 11/02/2019, 11/30/2019, 08/02/2020   Pneumococcal Conjugate-13 06/01/2013   Pneumococcal Polysaccharide-23 10/01/2010, 09/12/2016   Td 08/25/2002   Tdap 02/14/2013     ROS: See pertinent positives and negatives per HPI.  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   [place]   HERNIA REPAIR  10/23/2016   KNEE SURGERY  2006   arthroscopic left knee   KNEE SURGERY  2001   right knee     Current Outpatient Medications:    doxycycline (VIBRA-TABS) 100 MG tablet, Take 1 tablet (100 mg total) by mouth 2 (two) times daily., Disp: 20 tablet, Rfl: 0  albuterol (VENTOLIN HFA) 108 (90 Base) MCG/ACT inhaler, Inhale 2 puffs by mouth into the lungs every 6 (six) hours as needed for wheezing or shortness of breath., Disp: 54 g, Rfl: 1   amLODipine (NORVASC) 5 MG tablet, TAKE 1 TABLET BY MOUTH ONCE DAILY, Disp: 90 tablet, Rfl: 1   aspirin 81 MG tablet, Take 81 mg by mouth daily., Disp: , Rfl:    betamethasone dipropionate 0.05 % cream, APPLY TOPICALLY TWO TIMES ADAY, Disp: 30 g,  Rfl: 1   estradiol (ESTRACE VAGINAL) 0.1 MG/GM vaginal cream, 2g daily intravaginally for 1 week, then reduce to 1 gram PV daily for 1 week , followed by a maintenance dose of 1 g PV once weekly, Disp: 42.5 g, Rfl: 5   fluticasone (FLONASE) 50 MCG/ACT nasal spray, Place 2 sprays into both nostrils daily as needed for allergies or rhinitis., Disp: 16 g, Rfl: 0   Fluticasone-Umeclidin-Vilant (TRELEGY ELLIPTA) 100-62.5-25 MCG/INH AEPB, Inhale 1 puff by mouth everyday, Disp: 60 each, Rfl: 5   levothyroxine (SYNTHROID) 100 MCG tablet, TAKE 1 TABLET BY MOUTH DAILY, Disp: 90 tablet, Rfl: 1   montelukast (SINGULAIR) 10 MG tablet, Take 1 tablet (10 mg total) by mouth at bedtime., Disp: 90 tablet, Rfl: 1   multivitamin (THERAGRAN) per tablet, Take 1 tablet by mouth daily., Disp: , Rfl:    NON FORMULARY, TUMERIC MILK., Disp: , Rfl:    NON FORMULARY, CBD. Derivative of marijuana without the marijuana., Disp: , Rfl:    nystatin (MYCOSTATIN/NYSTOP) powder, APPLY ON TO THE SKIN 4 TIMES DAILY, Disp: 30 g, Rfl: 1   pantoprazole (PROTONIX) 40 MG tablet, Take 1 tablet (40 mg total) by mouth daily., Disp: 90 tablet, Rfl: 3   predniSONE (DELTASONE) 10 MG tablet, TAKE 4 TABLETS BY MOUTH ONCE DAILY FOR 2 DAYS, THEN 3 TABLETS DAILY FOR 2 DAYS, THEN 2 TABLETS DAILY FOR 2 DAYS, THEN 1 TABLET DAILY FOR 2 DAYS, Disp: 20 tablet, Rfl: 0   Probiotic Product (PROBIOTIC DAILY PO), Take 1 capsule by mouth daily., Disp: , Rfl:    traZODone (DESYREL) 50 MG tablet, 1/2 to 1 tab by mouth at bedtime as needed for insomnia, Disp: 30 tablet, Rfl: 1   VOLTAREN 1 % GEL, APPLY 2 GRAMS TOPICALLY 4 TIMES AS NEEDED, Disp: 100 g, Rfl: 2  EXAM:  VITALS per patient if applicable: BP 419/62, T 97.5, O2 96%, P 88  GENERAL: alert, oriented, appears well and in no acute distress  HEENT: atraumatic, conjunttiva clear, no obvious abnormalities on inspection of external nose and ears  NECK: normal movements of the head and neck  LUNGS: on  inspection no signs of respiratory distress, breathing rate appears normal, no obvious gross SOB, gasping or wheezing  CV: no obvious cyanosis  MS: moves all visible extremities without noticeable abnormality  PSYCH/NEURO: pleasant and cooperative, no obvious depression or anxiety, speech and thought processing grossly intact  ASSESSMENT AND PLAN:  Discussed the following assessment and plan:  Nasal sinus congestion  -we discussed possible serious and likely etiologies, options for evaluation and workup, limitations of telemedicine visit vs in person visit, treatment, treatment risks and precautions. Pt is agreeable to treatment via telemedicine at this moment. Query developing sinusitis, VURI vs other. She feels has developed a sinus infection from the chronic congestion with the allergies and wants to try empiric abx. Sent doxy. She declined prednisone and cough medication as alb is working currently and has tessalon at home.  Advised to seek prompt follow up  vv or in person care if worsening, new symptoms arise, or if is not improving with treatment. Discussed options for inperson care if PCP office not available. Did let this patient know that I only do telemedicine on Tuesdays and Thursdays for [place]. Advised to schedule follow up visit with PCP or [facility] if any further questions or concerns to avoid delays in care.   I discussed the assessment and treatment plan with the patient. The patient was provided an opportunity to ask questions and all were answered. The patient agreed with the plan and demonstrated an understanding of the instructions.     Lucretia Kern, DO

## 2021-07-05 ENCOUNTER — Ambulatory Visit: Payer: Medicare Other | Admitting: Physical Therapy

## 2021-07-09 ENCOUNTER — Other Ambulatory Visit: Payer: Self-pay

## 2021-07-09 ENCOUNTER — Ambulatory Visit: Payer: Medicare Other | Admitting: Physical Therapy

## 2021-07-19 ENCOUNTER — Other Ambulatory Visit (HOSPITAL_BASED_OUTPATIENT_CLINIC_OR_DEPARTMENT_OTHER): Payer: Self-pay

## 2021-07-19 ENCOUNTER — Other Ambulatory Visit: Payer: Self-pay | Admitting: Family

## 2021-07-19 MED ORDER — FLUTICASONE-UMECLIDIN-VILANT 100-62.5-25 MCG/ACT IN AEPB
INHALATION_SPRAY | RESPIRATORY_TRACT | 1 refills | Status: DC
  Filled 2021-07-19: qty 180, 90d supply, fill #0

## 2021-07-22 ENCOUNTER — Other Ambulatory Visit (HOSPITAL_BASED_OUTPATIENT_CLINIC_OR_DEPARTMENT_OTHER): Payer: Self-pay

## 2021-07-23 ENCOUNTER — Other Ambulatory Visit (HOSPITAL_BASED_OUTPATIENT_CLINIC_OR_DEPARTMENT_OTHER): Payer: Self-pay

## 2021-07-25 ENCOUNTER — Other Ambulatory Visit (HOSPITAL_BASED_OUTPATIENT_CLINIC_OR_DEPARTMENT_OTHER): Payer: Self-pay

## 2021-07-29 ENCOUNTER — Telehealth: Payer: Self-pay | Admitting: Family

## 2021-07-29 NOTE — Telephone Encounter (Signed)
Left message for patient to call back and schedule Medicare Annual Wellness Visit (AWV) either virtually or in office.   Last AWV 07/26/20 please schedule at anytime with health coach

## 2021-08-02 ENCOUNTER — Ambulatory Visit (INDEPENDENT_AMBULATORY_CARE_PROVIDER_SITE_OTHER): Payer: Medicare Other | Admitting: *Deleted

## 2021-08-02 DIAGNOSIS — E538 Deficiency of other specified B group vitamins: Secondary | ICD-10-CM | POA: Diagnosis not present

## 2021-08-02 MED ORDER — CYANOCOBALAMIN 1000 MCG/ML IJ SOLN
1000.0000 ug | Freq: Once | INTRAMUSCULAR | Status: AC
  Administered 2021-08-02: 1000 ug via INTRAMUSCULAR

## 2021-08-02 NOTE — Progress Notes (Signed)
Sheryl [name] is a 75 y.o. female presents to the office today for monthly b12 injection per physician's orders.  Cyanocobalamin 1,000 mcg,  IM was administered left deltoid today. Patient tolerated injection.  Patient next injection due: one month, appt made Yes

## 2021-08-06 ENCOUNTER — Ambulatory Visit (INDEPENDENT_AMBULATORY_CARE_PROVIDER_SITE_OTHER): Payer: Medicare Other

## 2021-08-06 ENCOUNTER — Telehealth: Payer: Self-pay | Admitting: Family

## 2021-08-06 VITALS — Ht 62.5 in | Wt 174.0 lb

## 2021-08-06 DIAGNOSIS — Z0000 Encounter for general adult medical examination without abnormal findings: Secondary | ICD-10-CM | POA: Diagnosis not present

## 2021-08-06 NOTE — Telephone Encounter (Signed)
I would recommend in person visit.

## 2021-08-06 NOTE — Progress Notes (Signed)
Subjective:   Sheryl [name] is a 75 y.o. female who presents for Medicare Annual (Subsequent) preventive examination.  I connected with Maddyn today by telephone and verified that I am speaking with the correct person using two identifiers. Location patient: home Location provider: work Persons participating in the virtual visit: patient, Marine scientist.    I discussed the limitations, risks, security and privacy concerns of performing an evaluation and management service by telephone and the availability of in person appointments. I also discussed with the patient that there may be a patient responsible charge related to this service. The patient expressed understanding and verbally consented to this telephonic visit.    Interactive audio and video telecommunications were attempted between this provider and patient, however failed, due to patient having technical difficulties OR patient did not have access to video capability.  We continued and completed visit with audio only.  Some vital signs may be absent or patient reported.   Time Spent with patient on telephone encounter: 20 minutes    Review of Systems     Cardiac Risk Factors include: advanced age (>60men, >71 women);diabetes mellitus;hypertension;dyslipidemia;obesity (BMI >30kg/m2)     Objective:    Today's Vitals   08/06/21 1022  Weight: 174 lb (78.9 kg)  Height: 5' 2.5" (1.588 m)   Body mass index is 31.32 kg/m.  Advanced Directives 08/06/2021 06/03/2021 08/14/2020 07/26/2020 05/27/2019 05/20/2018 05/19/2017  Does Patient Have a Medical Advance Directive? Yes Yes Yes Yes Yes Yes Yes  Type of Advance Directive Living will;Healthcare Power of Attorney Living will - [place];Living will [place];Living will [place];Living will Living will  Does patient want to make changes to medical advance directive? - No - Patient declined - - No - Patient declined - No -  Patient declined  Copy of [place] in Chart? No - copy requested - - No - copy requested No - copy requested No - copy requested -    Current Medications (verified) Outpatient Encounter Medications as of 08/06/2021  Medication Sig   albuterol (VENTOLIN HFA) 108 (90 Base) MCG/ACT inhaler Inhale 2 puffs by mouth into the lungs every 6 (six) hours as needed for wheezing or shortness of breath.   amLODipine (NORVASC) 5 MG tablet TAKE 1 TABLET BY MOUTH ONCE DAILY   aspirin 81 MG tablet Take 81 mg by mouth daily.   betamethasone dipropionate 0.05 % cream APPLY TOPICALLY TWO TIMES ADAY   estradiol (ESTRACE VAGINAL) 0.1 MG/GM vaginal cream 2g daily intravaginally for 1 week, then reduce to 1 gram PV daily for 1 week , followed by a maintenance dose of 1 g PV once weekly   fluticasone (FLONASE) 50 MCG/ACT nasal spray Place 2 sprays into both nostrils daily as needed for allergies or rhinitis.   Fluticasone-Umeclidin-Vilant 100-62.5-25 MCG/ACT AEPB Inhale 1 puff by mouth everyday   levothyroxine (SYNTHROID) 100 MCG tablet TAKE 1 TABLET BY MOUTH DAILY   montelukast (SINGULAIR) 10 MG tablet Take 1 tablet (10 mg total) by mouth at bedtime.   multivitamin (THERAGRAN) per tablet Take 1 tablet by mouth daily.   NON FORMULARY TUMERIC MILK.   NON FORMULARY CBD. Derivative of marijuana without the marijuana.   nystatin (MYCOSTATIN/NYSTOP) powder APPLY ON TO THE SKIN 4 TIMES DAILY   pantoprazole (PROTONIX) 40 MG tablet Take 1 tablet (40 mg total) by mouth daily.   predniSONE (DELTASONE) 10 MG tablet TAKE 4 TABLETS BY MOUTH ONCE DAILY FOR 2 DAYS, THEN  3 TABLETS DAILY FOR 2 DAYS, THEN 2 TABLETS DAILY FOR 2 DAYS, THEN 1 TABLET DAILY FOR 2 DAYS   Probiotic Product (PROBIOTIC DAILY PO) Take 1 capsule by mouth daily.   traZODone (DESYREL) 50 MG tablet 1/2 to 1 tab by mouth at bedtime as needed for insomnia   VOLTAREN 1 % GEL APPLY 2 GRAMS TOPICALLY 4 TIMES AS NEEDED   [DISCONTINUED] doxycycline  (VIBRA-TABS) 100 MG tablet Take 1 tablet (100 mg total) by mouth 2 (two) times daily.   [DISCONTINUED] lisinopril (ZESTRIL) 10 MG tablet Take 1 tablet (10 mg total) by mouth daily.   No facility-administered encounter medications on file as of 08/06/2021.    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   [place]   HERNIA REPAIR  10/23/2016   KNEE SURGERY  2006   arthroscopic left knee   KNEE SURGERY  2001   right knee   Family History  Problem Relation Age of Onset   Heart disease Other        CAD   Diabetes Other    Hyperlipidemia Other    Stroke Other    Social History   Socioeconomic History   Marital status: Single    Spouse name: Not on file   Number of children: 1   Years of education: Not on file   Highest education level: Not on file  Occupational History   Occupation: unemployed    Employer: DISABLED  Tobacco Use   Smoking status:  Some Days    Types: Cigarettes   Smokeless tobacco: Never   Tobacco comments:    smokes occasionally but not everyday  Substance and Sexual Activity   Alcohol use: Yes    Alcohol/week: 1.0 standard drink    Types: 1 Glasses of wine per week    Comment: occasionally   Drug use: Yes    Comment: Smokes "medical grade" marijuana 2 puffs 3 x weekly for rheumatoid arthritis.per pt   Sexual activity: Never  Other Topics Concern   Not on file  Social History Narrative   Regular exercise:  Stretching exercises. Resistance bands   Caffeine: 1 mug (2cus) daily.      Social Determinants of Health   Financial Resource Strain: Low Risk    Difficulty of Paying Living Expenses: Not hard at all  Food Insecurity: No Food Insecurity   Worried About Charity fundraiser in the Last Year: Never true  Ran Out of Food in the Last Year: Never true  Transportation Needs: No Transportation Needs   Lack of Transportation (Medical): No   Lack of Transportation (Non-Medical): No  Physical Activity: Sufficiently Active   Days of Exercise per Week: 7 days   Minutes of Exercise per Session: 30 min  Stress: No Stress Concern Present   Feeling of Stress : Not at all  Social Connections: Not on file    Tobacco Counseling Ready to quit: Not Answered Counseling given: Not Answered Tobacco comments: smokes occasionally but not everyday   Clinical Intake:  Pre-visit preparation completed: Yes  Pain : No/denies pain     BMI - recorded: 31.32 Nutritional Status: BMI > 30  Obese Nutritional Risks: None Diabetes: Yes CBG done?: No Did pt. bring in CBG monitor from home?: No (phone visit)  How often do you need to have someone help you when you read instructions, pamphlets, or other written materials from your doctor or pharmacy?: 1 - Never  Diabetes:  Is the patient diabetic?  Yes  If diabetic, was a CBG obtained today?  No  Did the patient bring in their glucometer from home?  No phone  visit How often do you monitor your CBG's? Never.   Financial Strains and Diabetes Management:  Are you having any financial strains with the device, your supplies or your medication? No .  Does the patient want to be seen by Chronic Care Management for management of their diabetes?  No  Would the patient like to be referred to a Nutritionist or for Diabetic Management?  No   Diabetic Exams:  Diabetic Eye Exam: . Overdue for diabetic eye exam. Pt has been advised about the importance in completing this exam. Patient has an upcoming appt  Diabetic Foot Exam:  Pt has been advised about the importance in completing this exam.To be completed by PCP.   Interpreter Needed?: No  Information entered by :: Caroleen Hamman LPN   Activities of Daily Living In your present state of health, do you have any difficulty performing the following activities: 08/06/2021  Hearing? N  Vision? N  Difficulty concentrating or making decisions? N  Walking or climbing stairs? Y  Comment stairs  Dressing or bathing? N  Doing errands, shopping? N  Preparing Food and eating ? N  Using the Toilet? N  In the past six months, have you accidently leaked urine? Y  Do you have problems with loss of bowel control? N  Managing your Medications? N  Managing your Finances? N  Housekeeping or managing your Housekeeping? N  Some recent data might be hidden    Patient Care Team: [name], NP as PCP - General Kerin Salen, DDS as Consulting Physician ([place]) [place] as Consulting Physician (Ophthalmology) Lake Bells., MD as Consulting Physician (Gastroenterology) Brooks Sailors, MD as Consulting Physician (Surgery) Zebedee Iba., MD as Consulting Physician (Hematology and Oncology)  Indicate any recent Medical Services you may have received from other than [facility] providers in the past year (date may be approximate).     Assessment:   This is a routine  wellness examination for [place].  Hearing/Vision screen Hearing Screening - Comments:: No issues Vision Screening - Comments:: Last eye exam-2021-has upcoming appt in [place] issues and exercise activities discussed: Current Exercise Habits: Home exercise routine, Type of exercise: yoga;strength training/weights;walking, Time (Minutes): 30, Frequency (Times/Week): 4, Weekly Exercise (Minutes/Week): 120, Intensity: Mild   Goals Addressed  This Visit's Progress    Patient Stated   On track    Continue drinking water, continue  current activity level & healthy eating       Depression Screen PHQ 2/9 Scores 08/06/2021 07/26/2020 05/27/2019 05/20/2018 05/19/2017 03/03/2016 01/08/2015  PHQ - 2 Score 0 0 0 0 0 0 1    Fall Risk Fall Risk  08/06/2021 07/26/2020 05/27/2019 05/20/2018 07/30/2017  Falls in the past year? 0 0 0 No No  Comment - - - - [place] Telephone Survey: data to providers prior to load  Number falls in past yr: 0 0 - - -  Injury with Fall? 0 0 - - -  Follow up Falls prevention discussed Falls prevention discussed - - -    FALL RISK PREVENTION PERTAINING TO THE HOME:  Any stairs in or around the home? No  Home free of loose throw rugs in walkways, pet beds, electrical cords, etc? Yes  Adequate lighting in your home to reduce risk of falls? Yes   ASSISTIVE DEVICES UTILIZED TO PREVENT FALLS:  Life alert? No  Use of a cane, walker or w/c? No  Grab bars in the bathroom? No  Shower chair or bench in shower? Yes  Elevated toilet seat or a handicapped toilet? No   TIMED UP AND GO:  Was the test performed? No . Virtual visit   Cognitive Function:Normal cognitive status assessed by  this Nurse Health Advisor. No abnormalities found.          Immunizations Immunization History  Administered Date(s) Administered   Fluad Quad(high Dose 65+) 04/29/2019, 05/11/2020, 05/17/2021   Influenza Split 07/07/2011   Influenza Whole 06/05/2009,  04/24/2010   Influenza, High Dose Seasonal PF 06/29/2015, 04/30/2016, 05/19/2017, 05/24/2018   Influenza,inj,Quad PF,6+ Mos 06/01/2013, 05/25/2014   Moderna SARS-COV2 Booster Vaccination 02/12/2021   Moderna Sars-Covid-2 Vaccination 11/02/2019, 11/30/2019, 08/02/2020   Pneumococcal Conjugate-13 06/01/2013   Pneumococcal Polysaccharide-23 10/01/2010, 09/12/2016   Td 08/25/2002   Tdap 02/14/2013    TDAP status: Up to date  Flu Vaccine status: Up to date  Pneumococcal vaccine status: Up to date  Covid-19 vaccine status: Information provided on how to obtain vaccines.   Qualifies for Shingles Vaccine? Yes   Zostavax completed No   Shingrix Completed?: No.    Education has been provided regarding the importance of this vaccine. Patient has been advised to call insurance company to determine out of pocket expense if they have not yet received this vaccine. Advised may also receive vaccine at local pharmacy or Health Dept. Verbalized acceptance and understanding.  Screening Tests Health Maintenance  Topic Date Due   Zoster Vaccines- Shingrix (1 of 2) Never done   OPHTHALMOLOGY EXAM  12/23/2016   FOOT EXAM  05/25/2019   COVID-19 Vaccine (4 - Booster for Moderna series) 04/09/2021   URINE MICROALBUMIN  04/13/2021   HEMOGLOBIN A1C  11/14/2021   TETANUS/TDAP  02/15/2023   MAMMOGRAM  06/05/2023   COLONOSCOPY (Pts 45-19yrs Insurance coverage will need to be confirmed)  01/01/2025   Pneumonia Vaccine 56+ Years old  Completed   INFLUENZA VACCINE  Completed   DEXA SCAN  Completed   Hepatitis C Screening  Completed   HPV VACCINES  Aged Out    Health Maintenance  Health Maintenance Due  Topic Date Due   Zoster Vaccines- Shingrix (1 of 2) Never done   OPHTHALMOLOGY EXAM  12/23/2016   FOOT EXAM  05/25/2019   COVID-19 Vaccine (4 - Booster for Moderna series) 04/09/2021   URINE  MICROALBUMIN  04/13/2021    Colorectal cancer screening: Type of screening: Colonoscopy. Completed 01/02/2015.  Repeat every 10 years  Mammogram status: Completed bilateral 06/04/2021. Repeat every year  Bone Density status: Completed 02/04/2021. Results reflect: Bone density results: NORMAL. Repeat every 2 years.  Lung Cancer Screening: (Low Dose CT Chest recommended if Age 76-80 years, 30 pack-year currently smoking OR have quit w/in 15years.) does not qualify.     Additional Screening:  Hepatitis C Screening: Completed 03/07/2016  Vision Screening: Recommended annual ophthalmology exams for early detection of glaucoma and other disorders of the eye. Is the patient up to date with their annual eye exam?  No  Who is the provider or what is the name of the office in which the patient attends annual eye exams? [place] Screening: Recommended annual dental exams for proper oral hygiene  Community Resource Referral / Chronic Care Management: CRR required this visit?  No   CCM required this visit?  No      Plan:     I have personally reviewed and noted the following in the patient's chart:   Medical and social history Use of alcohol, tobacco or illicit drugs  Current medications and supplements including opioid prescriptions.  Functional ability and status Nutritional status Physical activity Advanced directives List of other physicians Hospitalizations, surgeries, and ER visits in previous 12 months Vitals Screenings to include cognitive, depression, and falls Referrals and appointments  In addition, I have reviewed and discussed with patient certain preventive protocols, quality metrics, and best practice recommendations. A written personalized care plan for preventive services as well as general preventive health recommendations were provided to patient.   Due to this being a telephonic visit, the after visit summary with patients personalized plan was offered to patient via mail or my-chart.  Patient would like to access on my-chart.   Marta Antu,  LPN   [id number]  Nurse Health Advisor  Nurse Notes: None

## 2021-08-06 NOTE — Telephone Encounter (Signed)
Patient was offered to see another provider at another location tomorrow but she prefers to be seen in [place].  She was scheduled to see [name] Friday, pending a possible cancellation tomorrow or Thursday with one of the other providers here.

## 2021-08-06 NOTE — Patient Instructions (Signed)
Sheryl [name] , Thank you for taking time to complete your Medicare Wellness Visit. I appreciate your ongoing commitment to your health goals. Please review the following plan we discussed and let me know if I can assist you in the future.   Screening recommendations/referrals: Colonoscopy: Completed 01/02/2015-Due 01/02/2015 Mammogram: Completed 06/04/2021-Due 06/04/2022 Bone Density: Completed 02/04/2021-Due 02/05/2023 Recommended yearly ophthalmology/optometry visit for glaucoma screening and checkup Recommended yearly dental visit for hygiene and checkup  Vaccinations: Influenza vaccine: Up to date Pneumococcal vaccine: Up to date Tdap vaccine: Up to date Shingles vaccine: Discuss with pharmacy   Covid-19:Booster available at the pharmacy  Advanced directives: Please bring a copy of Living Will and/or Healthcare Power of Attorney for your chart.   Conditions/risks identified: See problem list  Next appointment: Follow up in one year for your annual wellness visit 08/08/2022 @ 10:20 (phone visit)   Preventive Care 65 Years and Older, Female Preventive care refers to lifestyle choices and visits with your health care provider that can promote health and wellness. What does preventive care include? A yearly physical exam. This is also called an annual well check. Dental exams once or twice a year. Routine eye exams. Ask your health care provider how often you should have your eyes checked. Personal lifestyle choices, including: Daily care of your teeth and gums. Regular physical activity. Eating a healthy diet. Avoiding tobacco and drug use. Limiting alcohol use. Practicing safe sex. Taking low-dose aspirin every day. Taking vitamin and mineral supplements as recommended by your health care provider. What happens during an annual well check? The services and screenings done by your health care provider during your annual well check will depend on your age, overall health,  lifestyle risk factors, and family history of disease. Counseling  Your health care provider may ask you questions about your: Alcohol use. Tobacco use. Drug use. Emotional well-being. Home and relationship well-being. Sexual activity. Eating habits. History of falls. Memory and ability to understand (cognition). Work and work Statistician. Reproductive health. Screening  You may have the following tests or measurements: Height, weight, and BMI. Blood pressure. Lipid and cholesterol levels. These may be checked every 5 years, or more frequently if you are over 37 years old. Skin check. Lung cancer screening. You may have this screening every year starting at age 74 if you have a 30-pack-year history of smoking and currently smoke or have quit within the past 15 years. Fecal occult blood test (FOBT) of the stool. You may have this test every year starting at age 74. Flexible sigmoidoscopy or colonoscopy. You may have a sigmoidoscopy every 5 years or a colonoscopy every 10 years starting at age 74. Hepatitis C blood test. Hepatitis B blood test. Sexually transmitted disease (STD) testing. Diabetes screening. This is done by checking your blood sugar (glucose) after you have not eaten for a while (fasting). You may have this done every 1-3 years. Bone density scan. This is done to screen for osteoporosis. You may have this done starting at age 74. Mammogram. This may be done every 1-2 years. Talk to your health care provider about how often you should have regular mammograms. Talk with your health care provider about your test results, treatment options, and if necessary, the need for more tests. Vaccines  Your health care provider may recommend certain vaccines, such as: Influenza vaccine. This is recommended every year. Tetanus, diphtheria, and acellular pertussis (Tdap, Td) vaccine. You may need a Td booster every 10 years. Zoster vaccine. You may need this  after age  74. Pneumococcal 13-valent conjugate (PCV13) vaccine. One dose is recommended after age 74. Pneumococcal polysaccharide (PPSV23) vaccine. One dose is recommended after age 74. Talk to your health care provider about which screenings and vaccines you need and how often you need them. This information is not intended to replace advice given to you by your health care provider. Make sure you discuss any questions you have with your health care provider. Document Released: 09/07/2015 Document Revised: 04/30/2016 Document Reviewed: 06/12/2015 Elsevier Interactive Patient Education  2017 [place] Prevention in the Home Falls can cause injuries. They can happen to people of all ages. There are many things you can do to make your home safe and to help prevent falls. What can I do on the outside of my home? Regularly fix the edges of walkways and driveways and fix any cracks. Remove anything that might make you trip as you walk through a door, such as a raised step or threshold. Trim any bushes or trees on the path to your home. Use bright outdoor lighting. Clear any walking paths of anything that might make someone trip, such as rocks or tools. Regularly check to see if handrails are loose or broken. Make sure that both sides of any steps have handrails. Any raised decks and porches should have guardrails on the edges. Have any leaves, snow, or ice cleared regularly. Use sand or salt on walking paths during winter. Clean up any spills in your garage right away. This includes oil or grease spills. What can I do in the bathroom? Use night lights. Install grab bars by the toilet and in the tub and shower. Do not use towel bars as grab bars. Use non-skid mats or decals in the tub or shower. If you need to sit down in the shower, use a plastic, non-slip stool. Keep the floor dry. Clean up any water that spills on the floor as soon as it happens. Remove soap buildup in the tub or shower  regularly. Attach bath mats securely with double-sided non-slip rug tape. Do not have throw rugs and other things on the floor that can make you trip. What can I do in the bedroom? Use night lights. Make sure that you have a light by your bed that is easy to reach. Do not use any sheets or blankets that are too big for your bed. They should not hang down onto the floor. Have a firm chair that has side arms. You can use this for support while you get dressed. Do not have throw rugs and other things on the floor that can make you trip. What can I do in the kitchen? Clean up any spills right away. Avoid walking on wet floors. Keep items that you use a lot in easy-to-reach places. If you need to reach something above you, use a strong step stool that has a grab bar. Keep electrical cords out of the way. Do not use floor polish or wax that makes floors slippery. If you must use wax, use non-skid floor wax. Do not have throw rugs and other things on the floor that can make you trip. What can I do with my stairs? Do not leave any items on the stairs. Make sure that there are handrails on both sides of the stairs and use them. Fix handrails that are broken or loose. Make sure that handrails are as long as the stairways. Check any carpeting to make sure that it is firmly attached to the  stairs. Fix any carpet that is loose or worn. Avoid having throw rugs at the top or bottom of the stairs. If you do have throw rugs, attach them to the floor with carpet tape. Make sure that you have a light switch at the top of the stairs and the bottom of the stairs. If you do not have them, ask someone to add them for you. What else can I do to help prevent falls? Wear shoes that: Do not have high heels. Have rubber bottoms. Are comfortable and fit you well. Are closed at the toe. Do not wear sandals. If you use a stepladder: Make sure that it is fully opened. Do not climb a closed stepladder. Make sure that  both sides of the stepladder are locked into place. Ask someone to hold it for you, if possible. Clearly mark and make sure that you can see: Any grab bars or handrails. First and last steps. Where the edge of each step is. Use tools that help you move around (mobility aids) if they are needed. These include: Canes. Walkers. Scooters. Crutches. Turn on the lights when you go into a dark area. Replace any light bulbs as soon as they burn out. Set up your furniture so you have a clear path. Avoid moving your furniture around. If any of your floors are uneven, fix them. If there are any pets around you, be aware of where they are. Review your medicines with your doctor. Some medicines can make you feel dizzy. This can increase your chance of falling. Ask your doctor what other things that you can do to help prevent falls. This information is not intended to replace advice given to you by your health care provider. Make sure you discuss any questions you have with your health care provider. Document Released: 06/07/2009 Document Revised: 01/17/2016 Document Reviewed: 09/15/2014 Elsevier Interactive Patient Education  2017 [employer].

## 2021-08-06 NOTE — Telephone Encounter (Signed)
Pt. Called and stated she finished her 10day antibiotic about 1week ago. She stated she hasn't really gotten better when it comes to the cough and congestion. She said the congestion has gotten really hard to get out and now its to the point where even after she eats the congestion is terrible. She has been taking robitussin, her daily inhaler, her emergency inhaler. Its very bad congestion and a bad cough.

## 2021-08-07 ENCOUNTER — Ambulatory Visit: Payer: Medicare Other | Admitting: Physical Therapy

## 2021-08-09 ENCOUNTER — Ambulatory Visit (INDEPENDENT_AMBULATORY_CARE_PROVIDER_SITE_OTHER): Payer: Medicare Other | Admitting: Family

## 2021-08-09 ENCOUNTER — Other Ambulatory Visit (HOSPITAL_BASED_OUTPATIENT_CLINIC_OR_DEPARTMENT_OTHER): Payer: Self-pay

## 2021-08-09 DIAGNOSIS — I10 Essential (primary) hypertension: Secondary | ICD-10-CM

## 2021-08-09 DIAGNOSIS — J4541 Moderate persistent asthma with (acute) exacerbation: Secondary | ICD-10-CM | POA: Insufficient documentation

## 2021-08-09 MED ORDER — PREDNISONE 10 MG PO TABS
ORAL_TABLET | ORAL | 0 refills | Status: DC
  Filled 2021-08-09: qty 14, 14d supply, fill #0

## 2021-08-09 MED ORDER — AZITHROMYCIN 250 MG PO TABS
ORAL_TABLET | ORAL | 0 refills | Status: AC
  Filled 2021-08-09: qty 6, 5d supply, fill #0

## 2021-08-09 NOTE — Patient Instructions (Addendum)
Continue use of prednisone for 7 days as prescribed. Continue use of albuterol inhaler as needed.  Take one tablet extra of 5 mg amlodipine, recheck blood pressure for the next couple of days and send MyChart message with results. If blood pressure remains above 160, please continue taking two 5 mg amlodipine daily.  Continue to monitor symptoms, if worsened or not improved please follow up.

## 2021-08-09 NOTE — Assessment & Plan Note (Addendum)
Uncontrolled. Will begin azithromycin.  She declines typical prednisone taper due to side effects. Wishes to try lower dose. Will start prednisone 10mg  once daily.

## 2021-08-09 NOTE — Progress Notes (Signed)
Subjective:   By signing my name below, I, Sheryl [name], attest that this documentation has been prepared under the direction and in the presence of Sheryl Alar, NP, 08/09/2021     Patient ID: Sheryl [name], female    DOB: 1946/05/13, 75 y.o.   MRN: [id number]  Chief Complaint  Patient presents with   Follow-up    Follow Up   Breathing Problem    Complains of shortness of breath no pain     HPI Patient is in today for an office visit.  Feeling ill: She went to visit her friend in [place] and when she was there her friend was sick. She came back and began feeling similar symptoms of cough, congestion, chest tightness. She was seen in August and was prescribed prednisone. She notes that with the use of the prednisone she did not sleep well for three days straight. She went to see another provider and was prescribed doxycycline. She is here today and she is still not improving. She is experiencing chest tightness and a cough. She notes having to use her albuterol on a regular schedule to help with her breathing. She denies feeling as though her sinuses are draining any more at this point.  Medication: She reports that long periods of prednisone are not as effective for her as short periods of prednisone. She is interested in having things adjusted accordingly.  Blood pressure: Her blood pressure is elevated in the office today. It was rechecked manually and recorded at 175/95. She is compliant in taking 5 mg amlodipine at this time. She believes her high blood pressure readings are a result of her being sick and anxious about having difficulty breathing.  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   [place]   HERNIA REPAIR  10/23/2016   KNEE SURGERY  2006   arthroscopic left knee   KNEE SURGERY  2001   right knee    Family History  Problem Relation Age of Onset   Heart disease Other        CAD   Diabetes Other    Hyperlipidemia Other    Stroke Other  Social History   Socioeconomic History   Marital status: Single    Spouse name: Not on file   Number of children: 1   Years of education: Not on file   Highest education level: Not on file  Occupational History   Occupation: unemployed    Employer: DISABLED  Tobacco Use   Smoking status: Some Days    Types: Cigarettes   Smokeless tobacco: Never   Tobacco comments:    smokes occasionally but not everyday  Substance and Sexual Activity   Alcohol use: Yes    Alcohol/week: 1.0 standard drink    Types: 1 Glasses of wine per week    Comment: occasionally   Drug use: Yes    Comment: Smokes "medical grade" marijuana 2 puffs 3 x weekly for rheumatoid arthritis.per pt   Sexual activity: Never  Other Topics Concern   Not on file  Social History  Narrative   Regular exercise:  Stretching exercises. Resistance bands   Caffeine: 1 mug (2cus) daily.      Social Determinants of Health   Financial Resource Strain: Low Risk    Difficulty of Paying Living Expenses: Not hard at all  Food Insecurity: No Food Insecurity   Worried About Charity fundraiser in the Last Year: Never true   [place] in the Last Year: Never true  Transportation Needs: No Transportation Needs   Lack of Transportation (Medical): No   Lack of Transportation (Non-Medical): No  Physical Activity: Sufficiently Active   Days of Exercise per Week: 7 days   Minutes of Exercise per Session: 30 min  Stress: No Stress Concern Present   Feeling of Stress : Not at all  Social Connections: Not on file  Intimate Partner Violence: Not At Risk   Fear of Current or Ex-Partner: No   Emotionally Abused: No   Physically Abused: No   Sexually Abused: No    Outpatient Medications Prior to Visit  Medication Sig Dispense Refill   albuterol (VENTOLIN HFA) 108 (90 Base) MCG/ACT inhaler Inhale 2 puffs by mouth into the lungs every 6 (six) hours as needed for wheezing or shortness of breath. 54 g 1   amLODipine (NORVASC) 5 MG tablet TAKE 1 TABLET BY MOUTH ONCE DAILY 90 tablet 1   aspirin 81 MG tablet Take 81 mg by mouth daily.     betamethasone dipropionate 0.05 % cream APPLY TOPICALLY TWO TIMES ADAY 30 g 1   estradiol (ESTRACE VAGINAL) 0.1 MG/GM vaginal cream 2g daily intravaginally for 1 week, then reduce to 1 gram PV daily for 1 week , followed by a maintenance dose of 1 g PV once weekly 42.5 g 5   fluticasone (FLONASE) 50 MCG/ACT nasal spray Place 2 sprays into both nostrils daily as needed for allergies or rhinitis. 16 g 0   Fluticasone-Umeclidin-Vilant 100-62.5-25 MCG/ACT AEPB Inhale 1 puff by mouth everyday 180 each 1   levothyroxine (SYNTHROID) 100 MCG tablet TAKE 1 TABLET BY MOUTH DAILY 90 tablet 1   montelukast (SINGULAIR) 10 MG tablet Take 1 tablet (10 mg total) by  mouth at bedtime. 90 tablet 1   multivitamin (THERAGRAN) per tablet Take 1 tablet by mouth daily.     NON FORMULARY TUMERIC MILK.     NON FORMULARY CBD. Derivative of marijuana without the marijuana.     nystatin (MYCOSTATIN/NYSTOP) powder APPLY ON TO THE SKIN 4 TIMES DAILY 30 g 1   pantoprazole (PROTONIX) 40 MG tablet Take 1 tablet (40 mg total) by  mouth daily. 90 tablet 3   Probiotic Product (PROBIOTIC DAILY PO) Take 1 capsule by mouth daily.     traZODone (DESYREL) 50 MG tablet 1/2 to 1 tab by mouth at bedtime as needed for insomnia 30 tablet 1   VOLTAREN 1 % GEL APPLY 2 GRAMS TOPICALLY 4 TIMES AS NEEDED 100 g 2   predniSONE (DELTASONE) 10 MG tablet TAKE 4 TABLETS BY MOUTH ONCE DAILY FOR 2 DAYS, THEN 3 TABLETS DAILY FOR 2 DAYS, THEN 2 TABLETS DAILY FOR 2 DAYS, THEN 1 TABLET DAILY FOR 2 DAYS 20 tablet 0   No facility-administered medications prior to visit.    Allergies  Allergen Reactions   Levofloxacin Shortness Of Breath and Rash   Mold Extract [Trichophyton] Anaphylaxis   Ativan [Lorazepam]     Visual hallucinations.  Visual hallucinations.    Cefuroxime Axetil     REACTION: asthma, cough   Hydrocodone Bit-Homatrop Mbr Nausea Only   Lisinopril     Diarrhea hives   Rosuvastatin Calcium     Muscle pain, syncope   Statins     Muscle pain, syncope   Zetia [Ezetimibe] Diarrhea   Hydrocodone-Acetaminophen Nausea Only    Review of Systems  HENT:  Positive for congestion. Negative for ear pain, sinus pain and sore throat.   Respiratory:  Positive for cough, sputum production (mild and described as green in color), shortness of breath and wheezing.        (+) chest tightness  Cardiovascular:  Negative for chest pain.  Neurological:  Negative for dizziness (vertigo improved since last seen).      Objective:    Physical Exam Constitutional:      General: She is not in acute distress.    Appearance: Normal appearance. She is not ill-appearing.  HENT:     Head:  Normocephalic and atraumatic.     Right Ear: External ear normal.     Left Ear: External ear normal.  Eyes:     Extraocular Movements: Extraocular movements intact.     Pupils: Pupils are equal, round, and reactive to light.  Cardiovascular:     Rate and Rhythm: Normal rate and regular rhythm.     Heart sounds: Normal heart sounds. No murmur heard.   No gallop.  Pulmonary:     Effort: Pulmonary effort is normal. No respiratory distress.     Breath sounds: Wheezing (Bilteral expiratory wheeze) present. No rales.  Lymphadenopathy:     Cervical: No cervical adenopathy.  Skin:    General: Skin is warm and dry.  Neurological:     Mental Status: She is alert and oriented to person, place, and time.  Psychiatric:        Behavior: Behavior normal.        Judgment: Judgment normal.    BP (!) 162/85 (BP Location: Right Arm, Patient Position: Sitting, Cuff Size: Small)    Pulse 79    Temp 98.2 F (36.8 C) (Oral)    Resp 16    Ht 5\' 2"  (1.575 m)    Wt 185 lb (83.9 kg)    SpO2 100%    BMI 33.84 kg/m  Wt Readings from Last 3 Encounters:  08/09/21 185 lb (83.9 kg)  08/06/21 174 lb (78.9 kg)  05/17/21 178 lb (80.7 kg)       Assessment & Plan:   Problem List Items Addressed This Visit       Unprioritized   Hypertension    Uncontrolled.  Advised pt to take an  additional 5mg  of amlodipine today. Then check bp once daily for next 3 days- send me readings on Monday. Advised pt to take additional 5mg  on amlodipine if SBP >160 at home.       Bronchitis, asthmatic, moderate persistent, with acute exacerbation    Uncontrolled. Will begin azithromycin.  She declines typical prednisone taper due to side effects. Wishes to try lower dose. Will start prednisone 10mg  once daily.       Relevant Medications   predniSONE (DELTASONE) 10 MG tablet   Meds ordered this encounter  Medications   azithromycin (ZITHROMAX) 250 MG tablet    Sig: Take 2 tablets by mouth on day 1, then 1 tablet daily on  days 2 through 5    Dispense:  6 tablet    Refill:  0    Order Specific Question:   Supervising Provider    Answer:   Penni Homans A [[id number]]   predniSONE (DELTASONE) 10 MG tablet    Sig: Take 1 tablet by mouth once daily for 1 week. Then keep additional tablets on hand.    Dispense:  14 tablet    Refill:  0    Order Specific Question:   Supervising Provider    Answer:   Penni Homans A [[id number]]    I, Sheryl Alar, NP, personally preformed the services described in this documentation.  All medical record entries made by the scribe were at my direction and in my presence.  I have reviewed the chart and discharge instructions (if applicable) and agree that the record reflects my personal performance and is accurate and complete. 08/09/2021  I,Sheryl [name],acting as a Education administrator for [name], NP.,have documented all relevant documentation on the behalf of [name], NP,as directed by  [name], NP while in the presence of [name], NP.  [name], NP

## 2021-08-09 NOTE — Assessment & Plan Note (Signed)
Uncontrolled.  Advised pt to take an additional 5mg  of amlodipine today. Then check bp once daily for next 3 days- send me readings on Monday. Advised pt to take additional 5mg  on amlodipine if SBP >160 at home.

## 2021-08-11 ENCOUNTER — Other Ambulatory Visit: Payer: Self-pay | Admitting: Family

## 2021-08-30 ENCOUNTER — Ambulatory Visit: Payer: Medicare Other

## 2021-09-06 ENCOUNTER — Ambulatory Visit (INDEPENDENT_AMBULATORY_CARE_PROVIDER_SITE_OTHER): Payer: Medicare Other

## 2021-09-06 DIAGNOSIS — E538 Deficiency of other specified B group vitamins: Secondary | ICD-10-CM | POA: Diagnosis not present

## 2021-09-06 MED ORDER — CYANOCOBALAMIN 1000 MCG/ML IJ SOLN
1000.0000 ug | Freq: Once | INTRAMUSCULAR | Status: AC
  Administered 2021-09-06: 1000 ug via INTRAMUSCULAR

## 2021-09-06 NOTE — Progress Notes (Signed)
Sheryl [name] is a 76 y.o. female presents to the office today for: Monthly B12 injections, per physician's orders.  Cyanocobalamin 1013mcg/ml administered IM in left deltoid.  Patient tolerated injection. Patient due for follow up labs/provider appt: Yes. Date due: 08/2021, appt made Yes Patient next injection due: 1 month, appt made Yes  Gerilyn Nestle

## 2021-09-10 ENCOUNTER — Other Ambulatory Visit (HOSPITAL_BASED_OUTPATIENT_CLINIC_OR_DEPARTMENT_OTHER): Payer: Self-pay

## 2021-09-10 ENCOUNTER — Encounter: Payer: Self-pay | Admitting: Family

## 2021-09-10 ENCOUNTER — Ambulatory Visit (INDEPENDENT_AMBULATORY_CARE_PROVIDER_SITE_OTHER): Payer: Medicare Other | Admitting: Family

## 2021-09-10 ENCOUNTER — Other Ambulatory Visit: Payer: Self-pay

## 2021-09-10 ENCOUNTER — Ambulatory Visit (HOSPITAL_BASED_OUTPATIENT_CLINIC_OR_DEPARTMENT_OTHER)
Admission: RE | Admit: 2021-09-10 | Discharge: 2021-09-10 | Disposition: A | Discharge status: Home / Self Care | Payer: Medicare Other | Source: Ambulatory Visit | Attending: Family | Admitting: Family

## 2021-09-10 VITALS — BP 130/75 | HR 83 | Temp 98.8°F | Resp 16 | Wt 186.0 lb

## 2021-09-10 DIAGNOSIS — J4540 Moderate persistent asthma, uncomplicated: Secondary | ICD-10-CM | POA: Insufficient documentation

## 2021-09-10 DIAGNOSIS — R5383 Other fatigue: Secondary | ICD-10-CM

## 2021-09-10 DIAGNOSIS — E039 Hypothyroidism, unspecified: Secondary | ICD-10-CM

## 2021-09-10 DIAGNOSIS — R059 Cough, unspecified: Secondary | ICD-10-CM | POA: Diagnosis not present

## 2021-09-10 DIAGNOSIS — I10 Essential (primary) hypertension: Secondary | ICD-10-CM | POA: Diagnosis not present

## 2021-09-10 LAB — CBC WITH DIFFERENTIAL/PLATELET
Basophils Absolute: 0.1 10*3/uL (ref 0.0–0.1)
Basophils Relative: 0.6 % (ref 0.0–3.0)
Eosinophils Absolute: 0.1 10*3/uL (ref 0.0–0.7)
Eosinophils Relative: 0.7 % (ref 0.0–5.0)
HCT: 46.4 % — ABNORMAL HIGH (ref 36.0–46.0)
Hemoglobin: 15.4 g/dL — ABNORMAL HIGH (ref 12.0–15.0)
Lymphocytes Relative: 19 % (ref 12.0–46.0)
Lymphs Abs: 1.8 10*3/uL (ref 0.7–4.0)
MCHC: 33.1 g/dL (ref 30.0–36.0)
MCV: 93.1 fl (ref 78.0–100.0)
Monocytes Absolute: 0.5 10*3/uL (ref 0.1–1.0)
Monocytes Relative: 5.1 % (ref 3.0–12.0)
Neutro Abs: 6.9 10*3/uL (ref 1.4–7.7)
Neutrophils Relative %: 74.6 % (ref 43.0–77.0)
Platelets: 180 10*3/uL (ref 150.0–400.0)
RBC: 4.99 Mil/uL (ref 3.87–5.11)
RDW: 14 % (ref 11.5–15.5)
WBC: 9.2 10*3/uL (ref 4.0–10.5)

## 2021-09-10 LAB — COMPREHENSIVE METABOLIC PANEL
ALT: 48 U/L — ABNORMAL HIGH (ref 0–35)
AST: 38 U/L — ABNORMAL HIGH (ref 0–37)
Albumin: 4 g/dL (ref 3.5–5.2)
Alkaline Phosphatase: 89 U/L (ref 39–117)
BUN: 20 mg/dL (ref 6–23)
CO2: 30 mEq/L (ref 19–32)
Calcium: 9.2 mg/dL (ref 8.4–10.5)
Chloride: 98 mEq/L (ref 96–112)
Creatinine, Ser: 0.86 mg/dL (ref 0.40–1.20)
GFR: 66.63 mL/min (ref >60.00)
Glucose, Bld: 97 mg/dL (ref 70–99)
Potassium: 4.4 mEq/L (ref 3.5–5.1)
Sodium: 136 mEq/L (ref 135–145)
Total Bilirubin: 0.5 mg/dL (ref 0.2–1.2)
Total Protein: 7.9 g/dL (ref 6.0–8.3)

## 2021-09-10 LAB — TSH: TSH: 0.79 u[IU]/mL (ref 0.35–5.50)

## 2021-09-10 MED ORDER — ALBUTEROL SULFATE HFA 108 (90 BASE) MCG/ACT IN AERS
2.0000 | INHALATION_SPRAY | Freq: Four times a day (QID) | RESPIRATORY_TRACT | 1 refills | Status: DC | PRN
  Filled 2021-09-10: qty 18, 25d supply, fill #0

## 2021-09-10 MED ORDER — TRELEGY ELLIPTA 200-62.5-25 MCG/ACT IN AEPB
1.0000 | INHALATION_SPRAY | Freq: Two times a day (BID) | RESPIRATORY_TRACT | 5 refills | Status: DC
  Filled 2021-09-10 – 2021-12-06 (×2): qty 60, 30d supply, fill #0

## 2021-09-10 NOTE — Assessment & Plan Note (Signed)
Recheck TSH. Continue synthroid.

## 2021-09-10 NOTE — Assessment & Plan Note (Signed)
BP Readings from Last 3 Encounters:  09/10/21 130/75  08/09/21 (!) 162/85  05/17/21 (!) 152/74   Initial bp was high upon arrival but repeat BP was improved.

## 2021-09-10 NOTE — Progress Notes (Signed)
Subjective:     Patient ID: Sheryl [name], female    DOB: [birth year], 76 y.o.   MRN: [id number]  Chief Complaint  Patient presents with   Hypertension    Here for follow up   Cough    Complains of still having a cough    Hypertension  Cough   Has AM cough- Reports "no stamina" just feels weak.    Wt Readings from Last 3 Encounters:  09/10/21 186 lb (84.4 kg)  08/09/21 185 lb (83.9 kg)  08/06/21 174 lb (78.9 kg)   She has gained some weight.  Feels like this is contributing to her hypertension.   She has not smoked in a few months.     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   [place]   HERNIA REPAIR  10/23/2016   KNEE SURGERY  2006   arthroscopic left knee   KNEE SURGERY  2001   right knee    Family History  Problem Relation Age of Onset   Heart disease Other        CAD   Diabetes Other    Hyperlipidemia  Other    Stroke Other     Social History   Socioeconomic History   Marital status: Single    Spouse name: Not on file   Number of children: 1   Years of education: Not on file   Highest education level: Not on file  Occupational History   Occupation: unemployed    Employer: DISABLED  Tobacco Use   Smoking status: Former    Types: Cigarettes    Quit date: 04/25/2021    Years since quitting: 0.3   Smokeless tobacco: Never   Tobacco comments:    smokes occasionally but not everyday  Substance and Sexual Activity   Alcohol use: Yes    Alcohol/week: 1.0 standard drink    Types: 1 Glasses of wine per week    Comment: occasionally   Drug use: Yes    Comment: Smokes "medical grade" marijuana 2 puffs 3 x weekly for rheumatoid arthritis.per pt   Sexual activity: Never  Other Topics Concern   Not on file  Social History Narrative   Regular exercise:  Stretching exercises. Resistance bands   Caffeine: 1 mug (2cus) daily.  Social Determinants of Health   Financial Resource Strain: Low Risk    Difficulty of Paying Living Expenses: Not hard at all  Food Insecurity: No Food Insecurity   Worried About Charity fundraiser in the Last Year: Never true   [place] in the Last Year: Never true  Transportation Needs: No Transportation Needs   Lack of Transportation (Medical): No   Lack of Transportation (Non-Medical): No  Physical Activity: Sufficiently Active   Days of Exercise per Week: 7 days   Minutes of Exercise per Session: 30 min  Stress: No Stress Concern Present   Feeling of Stress : Not at all  Social Connections: Not on file  Intimate Partner Violence: Not At Risk   Fear of Current or Ex-Partner: No   Emotionally Abused: No   Physically Abused: No   Sexually Abused: No    Outpatient Medications Prior to Visit  Medication Sig Dispense Refill   amLODipine (NORVASC) 5 MG tablet TAKE 1 TABLET BY MOUTH ONCE DAILY 90 tablet 1   aspirin 81 MG tablet Take 81 mg by  mouth daily.     betamethasone dipropionate 0.05 % cream APPLY TOPICALLY TWO TIMES ADAY 30 g 1   estradiol (ESTRACE VAGINAL) 0.1 MG/GM vaginal cream 2g daily intravaginally for 1 week, then reduce to 1 gram PV daily for 1 week , followed by a maintenance dose of 1 g PV once weekly 42.5 g 5   fluticasone (FLONASE) 50 MCG/ACT nasal spray Place 2 sprays into both nostrils daily as needed for allergies or rhinitis. 16 g 0   levothyroxine (SYNTHROID) 100 MCG tablet TAKE 1 TABLET DAILY 90 tablet 1   montelukast (SINGULAIR) 10 MG tablet Take 1 tablet (10 mg total) by mouth at bedtime. 90 tablet 1   multivitamin (THERAGRAN) per tablet Take 1 tablet by mouth daily.     NON FORMULARY TUMERIC MILK.     NON FORMULARY CBD. Derivative of marijuana without the marijuana.     pantoprazole (PROTONIX) 40 MG tablet Take 1 tablet (40 mg total) by mouth daily. 90 tablet 3   predniSONE (DELTASONE) 10 MG tablet Take 1 tablet by mouth once daily for 1 week. Then keep additional tablets on hand. 14 tablet 0   Probiotic Product (PROBIOTIC DAILY PO) Take 1 capsule by mouth daily.     traZODone (DESYREL) 50 MG tablet 1/2 to 1 tab by mouth at bedtime as needed for insomnia 30 tablet 1   VOLTAREN 1 % GEL APPLY 2 GRAMS TOPICALLY 4 TIMES AS NEEDED 100 g 2   albuterol (VENTOLIN HFA) 108 (90 Base) MCG/ACT inhaler Inhale 2 puffs by mouth into the lungs every 6 (six) hours as needed for wheezing or shortness of breath. 54 g 1   Fluticasone-Umeclidin-Vilant 100-62.5-25 MCG/ACT AEPB Inhale 1 puff by mouth everyday 180 each 1   No facility-administered medications prior to visit.    Allergies  Allergen Reactions   Levofloxacin Shortness Of Breath and Rash   Mold Extract [Trichophyton] Anaphylaxis   Ativan [Lorazepam]     Visual hallucinations.  Visual hallucinations.    Cefuroxime Axetil     REACTION: asthma, cough   Hydrocodone Bit-Homatrop Mbr Nausea Only   Lisinopril     Diarrhea hives   Rosuvastatin Calcium      Muscle pain, syncope   Statins     Muscle pain, syncope   Zetia [Ezetimibe] Diarrhea   Hydrocodone-Acetaminophen Nausea Only    Review of Systems  Respiratory:  Positive for cough.   See HPI    Objective:    Physical Exam Constitutional:      General: She is not in acute distress.    Appearance: Normal appearance. She is well-developed.  HENT:     Head: Normocephalic and atraumatic.     Right Ear: External ear normal.     Left Ear: External ear normal.  Eyes:     General: No scleral icterus. Neck:     Thyroid: No thyromegaly.  Cardiovascular:     Rate and Rhythm: Normal rate and regular rhythm.     Heart sounds: Normal heart sounds. No murmur heard. Pulmonary:     Effort: Pulmonary effort is normal. No respiratory distress.     Breath sounds: Normal breath sounds. No wheezing.  Musculoskeletal:     Cervical back: Neck supple.  Skin:    General: Skin is warm and dry.  Neurological:     Mental Status: She is alert and oriented to person, place, and time.  Psychiatric:        Mood and Affect: Mood normal.        Behavior: Behavior normal.        Thought Content: Thought content normal.        Judgment: Judgment normal.    BP 130/75    Pulse 83    Temp 98.8 F (37.1 C) (Oral)    Resp 16    Wt 186 lb (84.4 kg)    SpO2 99%    BMI 34.02 kg/m  Wt Readings from Last 3 Encounters:  09/10/21 186 lb (84.4 kg)  08/09/21 185 lb (83.9 kg)  08/06/21 174 lb (78.9 kg)       Assessment & Plan:   Problem List Items Addressed This Visit       Unprioritized   Hypothyroidism    Recheck TSH. Continue synthroid.       Hypertension    BP Readings from Last 3 Encounters:  09/10/21 130/75  08/09/21 (!) 162/85  05/17/21 (!) 152/74  Initial bp was high upon arrival but repeat BP was improved.       Asthma    Uncontrolled. When she is able to afford it she will step up the Trelegy from to . She has been taking some prednisone that she had on hand. Advised her to  discontinue. Will refer to pulmonology. She prefers someone closer to home. Will also check cxr to rule out PNA.       Relevant Medications   Fluticasone-Umeclidin-Vilant (TRELEGY ELLIPTA) 200-62.5-25 MCG/ACT AEPB   albuterol (VENTOLIN HFA) 108 (90 Base) MCG/ACT inhaler   Other Relevant Orders   Ambulatory referral to Pulmonology   DG Chest 2 View   Other Visit Diagnoses     Fatigue, unspecified type    -  Primary   Relevant Orders   CBC with Differential/Platelet   TSH   Comp Met (CMET)   Ambulatory referral to Pulmonology       I have discontinued Lailani L. Arcidiacono's Fluticasone-Umeclidin-Vilant. I have also changed her albuterol. Additionally, I am having her start on Trelegy Ellipta. Lastly, I am having her maintain her aspirin, multivitamin, NON FORMULARY, Probiotic Product (PROBIOTIC DAILY PO), NON FORMULARY, estradiol, Voltaren, traZODone, betamethasone dipropionate, pantoprazole, amLODipine, montelukast, fluticasone, predniSONE, and levothyroxine.  Meds ordered this encounter  Medications   Fluticasone-Umeclidin-Vilant (TRELEGY ELLIPTA) 200-62.5-25 MCG/ACT AEPB    Sig: Inhale 1 puff into the lungs in the morning and at bedtime.    Dispense:  60 each    Refill:  5    Order Specific Question:   Supervising Provider    Answer:   Penni Homans A [[id number]]   albuterol (VENTOLIN HFA) 108 (90 Base) MCG/ACT inhaler    Sig: Inhale 2 puffs into the lungs every 6 (six) hours as needed for wheezing or shortness of breath.    Dispense:  54 g    Refill:  1    Order Specific Question:   Supervising Provider    Answer:   Penni Homans A [[id number]]

## 2021-09-10 NOTE — Assessment & Plan Note (Addendum)
Uncontrolled. When she is able to afford it she will step up the Trelegy from 148mcg to 290mcg. She has been taking some prednisone that she had on hand. Advised her to discontinue. Will refer to pulmonology. She prefers someone closer to home. Will also check cxr to rule out PNA.

## 2021-09-10 NOTE — Patient Instructions (Addendum)
Please increase Trelegy dose to 26mcg twice daily. Please complete chest x-ray on the first floor.

## 2021-09-11 ENCOUNTER — Telehealth: Payer: Self-pay | Admitting: Family

## 2021-09-11 DIAGNOSIS — R7989 Other specified abnormal findings of blood chemistry: Secondary | ICD-10-CM

## 2021-09-11 NOTE — Telephone Encounter (Signed)
Patient advised of results, provider's advise and [place] ordered

## 2021-09-11 NOTE — Telephone Encounter (Signed)
LFT's are elevated. Most likely cause is fatty liver in the setting of recent weight gain. Recommend low fat/low cholesterol diet, exercise and weight loss.  I would like her to complete an abdominal US please.

## 2021-09-13 ENCOUNTER — Telehealth (HOSPITAL_BASED_OUTPATIENT_CLINIC_OR_DEPARTMENT_OTHER): Payer: Self-pay

## 2021-09-26 ENCOUNTER — Ambulatory Visit (HOSPITAL_BASED_OUTPATIENT_CLINIC_OR_DEPARTMENT_OTHER)
Admission: RE | Admit: 2021-09-26 | Discharge: 2021-09-26 | Disposition: A | Discharge status: Home / Self Care | Payer: Medicare Other | Source: Ambulatory Visit | Attending: Family | Admitting: Family

## 2021-09-26 ENCOUNTER — Other Ambulatory Visit: Payer: Self-pay

## 2021-09-26 DIAGNOSIS — R7989 Other specified abnormal findings of blood chemistry: Secondary | ICD-10-CM | POA: Diagnosis not present

## 2021-09-26 DIAGNOSIS — R945 Abnormal results of liver function studies: Secondary | ICD-10-CM | POA: Diagnosis not present

## 2021-10-11 ENCOUNTER — Ambulatory Visit (INDEPENDENT_AMBULATORY_CARE_PROVIDER_SITE_OTHER): Payer: Medicare Other

## 2021-10-11 DIAGNOSIS — E538 Deficiency of other specified B group vitamins: Secondary | ICD-10-CM | POA: Diagnosis not present

## 2021-10-11 MED ORDER — CYANOCOBALAMIN 1000 MCG/ML IJ SOLN
1000.0000 ug | Freq: Once | INTRAMUSCULAR | Status: AC
  Administered 2021-10-11: 1000 ug via INTRAMUSCULAR

## 2021-10-11 NOTE — Progress Notes (Signed)
Sheryl [name] is a 76 y.o. female presents to the office today for: Monthly B12 injections, per physician's orders.   Cyanocobalamin 1054mcg/ml administered IM in left deltoid.  Patient tolerated injection. Patient due for follow up labs/provider appt: Yes. Date due: 08/2021, appt made Yes Patient next injection due: 1 month, appt made Yes

## 2021-11-08 ENCOUNTER — Ambulatory Visit (INDEPENDENT_AMBULATORY_CARE_PROVIDER_SITE_OTHER): Payer: Medicare Other

## 2021-11-08 DIAGNOSIS — E538 Deficiency of other specified B group vitamins: Secondary | ICD-10-CM

## 2021-11-08 MED ORDER — CYANOCOBALAMIN 1000 MCG/ML IJ SOLN
1000.0000 ug | Freq: Once | INTRAMUSCULAR | Status: AC
  Administered 2021-11-08: 1000 ug via INTRAMUSCULAR

## 2021-11-08 NOTE — Progress Notes (Signed)
Sheryl [name] is a 76 y.o. female presents to the office today for monthly b12 injection per physician's orders. ?  ?Cyanocobalamin 1,000 mcg,  IM was administered left deltoid today. Patient tolerated injection. ?  ?Patient next injection due: one month, appt made Yes ?

## 2021-11-15 ENCOUNTER — Ambulatory Visit: Payer: Medicare Other

## 2021-11-20 ENCOUNTER — Telehealth: Payer: Self-pay | Admitting: Family

## 2021-11-20 ENCOUNTER — Other Ambulatory Visit: Payer: Self-pay | Admitting: Family

## 2021-11-20 NOTE — Telephone Encounter (Signed)
Pt states she needs labs for vitamin d deficiency test for dental surgeon. Please advise  ?

## 2021-11-21 ENCOUNTER — Other Ambulatory Visit: Payer: Self-pay

## 2021-11-21 DIAGNOSIS — I10 Essential (primary) hypertension: Secondary | ICD-10-CM

## 2021-11-21 DIAGNOSIS — D649 Anemia, unspecified: Secondary | ICD-10-CM

## 2021-11-21 DIAGNOSIS — E559 Vitamin D deficiency, unspecified: Secondary | ICD-10-CM

## 2021-11-21 NOTE — Telephone Encounter (Signed)
Patient scheduled to come in tomorrow for labs. Orders entered ?

## 2021-11-22 ENCOUNTER — Other Ambulatory Visit (INDEPENDENT_AMBULATORY_CARE_PROVIDER_SITE_OTHER): Payer: Medicare Other

## 2021-11-22 DIAGNOSIS — E559 Vitamin D deficiency, unspecified: Secondary | ICD-10-CM | POA: Diagnosis not present

## 2021-11-26 LAB — VITAMIN D 1,25 DIHYDROXY
Vitamin D 1, 25 (OH)2 Total: 45 pg/mL (ref 18–72)
Vitamin D2 1, 25 (OH)2: <8 pg/mL
Vitamin D3 1, 25 (OH)2: 45 pg/mL

## 2021-12-06 ENCOUNTER — Other Ambulatory Visit (HOSPITAL_BASED_OUTPATIENT_CLINIC_OR_DEPARTMENT_OTHER): Payer: Self-pay

## 2021-12-11 ENCOUNTER — Other Ambulatory Visit (HOSPITAL_BASED_OUTPATIENT_CLINIC_OR_DEPARTMENT_OTHER): Payer: Self-pay

## 2021-12-13 ENCOUNTER — Ambulatory Visit (INDEPENDENT_AMBULATORY_CARE_PROVIDER_SITE_OTHER): Payer: Medicare Other

## 2021-12-13 DIAGNOSIS — E559 Vitamin D deficiency, unspecified: Secondary | ICD-10-CM

## 2021-12-13 MED ORDER — CYANOCOBALAMIN 1000 MCG/ML IJ SOLN
1000.0000 ug | Freq: Once | INTRAMUSCULAR | Status: AC
  Administered 2021-12-13: 1000 ug via INTRAMUSCULAR

## 2021-12-13 NOTE — Progress Notes (Addendum)
Sheryl [name] is a 76 y.o. female presents to the office today for monthly b12 injection per physician's orders. ? ?Cyanocobalamin 1,000 mcg, IM was administered left deltoid today. Patient tolerated injection. ? ?Patient next injection due: one month, appt made Yes ? ?

## 2022-01-02 DIAGNOSIS — Z20822 Contact with and (suspected) exposure to covid-19: Secondary | ICD-10-CM | POA: Diagnosis not present

## 2022-01-09 ENCOUNTER — Other Ambulatory Visit: Payer: Self-pay | Admitting: Family

## 2022-01-09 ENCOUNTER — Other Ambulatory Visit (HOSPITAL_BASED_OUTPATIENT_CLINIC_OR_DEPARTMENT_OTHER): Payer: Self-pay

## 2022-01-09 MED ORDER — TRELEGY ELLIPTA 200-62.5-25 MCG/ACT IN AEPB
1.0000 | INHALATION_SPRAY | Freq: Two times a day (BID) | RESPIRATORY_TRACT | 5 refills | Status: DC
  Filled 2022-01-09: qty 60, 30d supply, fill #0
  Filled 2022-02-03: qty 60, 30d supply, fill #1
  Filled 2022-03-06: qty 60, 30d supply, fill #2
  Filled 2022-04-05: qty 60, 30d supply, fill #3
  Filled 2022-05-05: qty 60, 30d supply, fill #4
  Filled 2022-06-02: qty 60, 30d supply, fill #5

## 2022-01-10 ENCOUNTER — Ambulatory Visit (INDEPENDENT_AMBULATORY_CARE_PROVIDER_SITE_OTHER): Payer: Medicare Other

## 2022-01-10 DIAGNOSIS — E538 Deficiency of other specified B group vitamins: Secondary | ICD-10-CM | POA: Diagnosis not present

## 2022-01-10 DIAGNOSIS — E559 Vitamin D deficiency, unspecified: Secondary | ICD-10-CM

## 2022-01-10 MED ORDER — CYANOCOBALAMIN 1000 MCG/ML IJ SOLN
1000.0000 ug | Freq: Once | INTRAMUSCULAR | Status: AC
  Administered 2022-01-10: 1000 ug via INTRAMUSCULAR

## 2022-01-10 NOTE — Progress Notes (Signed)
Sheryl [name] is a 76 y.o. female presents to the office today for monthly b12 injection per physician's orders.  Cyanocobalamin 1,000 mcg, IM was administered left deltoid today. Patient tolerated injection.  Patient next injection due: one month, appt made Yes

## 2022-01-13 ENCOUNTER — Other Ambulatory Visit (HOSPITAL_BASED_OUTPATIENT_CLINIC_OR_DEPARTMENT_OTHER): Payer: Self-pay

## 2022-01-13 ENCOUNTER — Ambulatory Visit (INDEPENDENT_AMBULATORY_CARE_PROVIDER_SITE_OTHER): Payer: Medicare Other | Admitting: Family

## 2022-01-13 ENCOUNTER — Ambulatory Visit: Payer: Medicare Other | Attending: Internal Medicine

## 2022-01-13 DIAGNOSIS — E039 Hypothyroidism, unspecified: Secondary | ICD-10-CM

## 2022-01-13 DIAGNOSIS — J4520 Mild intermittent asthma, uncomplicated: Secondary | ICD-10-CM

## 2022-01-13 DIAGNOSIS — I10 Essential (primary) hypertension: Secondary | ICD-10-CM | POA: Diagnosis not present

## 2022-01-13 DIAGNOSIS — I77819 Aortic ectasia, unspecified site: Secondary | ICD-10-CM

## 2022-01-13 DIAGNOSIS — K219 Gastro-esophageal reflux disease without esophagitis: Secondary | ICD-10-CM | POA: Diagnosis not present

## 2022-01-13 DIAGNOSIS — N952 Postmenopausal atrophic vaginitis: Secondary | ICD-10-CM

## 2022-01-13 DIAGNOSIS — Z23 Encounter for immunization: Secondary | ICD-10-CM | POA: Diagnosis not present

## 2022-01-13 DIAGNOSIS — E119 Type 2 diabetes mellitus without complications: Secondary | ICD-10-CM | POA: Diagnosis not present

## 2022-01-13 LAB — COMPREHENSIVE METABOLIC PANEL
ALT: 33 U/L (ref 0–35)
AST: 40 U/L — ABNORMAL HIGH (ref 0–37)
Albumin: 3.7 g/dL (ref 3.5–5.2)
Alkaline Phosphatase: 97 U/L (ref 39–117)
BUN: 13 mg/dL (ref 6–23)
CO2: 29 mEq/L (ref 19–32)
Calcium: 9.1 mg/dL (ref 8.4–10.5)
Chloride: 100 mEq/L (ref 96–112)
Creatinine, Ser: 0.8 mg/dL (ref 0.40–1.20)
GFR: 72.5 mL/min (ref >60.00)
Glucose, Bld: 108 mg/dL — ABNORMAL HIGH (ref 70–99)
Potassium: 4.3 mEq/L (ref 3.5–5.1)
Sodium: 135 mEq/L (ref 135–145)
Total Bilirubin: 0.5 mg/dL (ref 0.2–1.2)
Total Protein: 8 g/dL (ref 6.0–8.3)

## 2022-01-13 LAB — MICROALBUMIN / CREATININE URINE RATIO
Creatinine,U: 71.2 mg/dL
Microalb Creat Ratio: 1.7 mg/g (ref 0.0–30.0)
Microalb, Ur: 1.2 mg/dL (ref 0.0–1.9)

## 2022-01-13 LAB — HEMOGLOBIN A1C: Hgb A1c MFr Bld: 6.2 % (ref 4.6–6.5)

## 2022-01-13 LAB — TSH: TSH: 2 u[IU]/mL (ref 0.35–5.50)

## 2022-01-13 MED ORDER — MODERNA COVID-19 BIVAL BOOSTER 50 MCG/0.5ML IM SUSP
INTRAMUSCULAR | 0 refills | Status: DC
  Filled 2022-01-13: qty 0.5, 1d supply, fill #0

## 2022-01-13 MED ORDER — SHINGRIX 50 MCG/0.5ML IM SUSR
INTRAMUSCULAR | 1 refills | Status: DC
  Filled 2022-01-13 – 2022-02-07 (×2): qty 0.5, 1d supply, fill #0

## 2022-01-13 MED ORDER — BETAMETHASONE DIPROPIONATE 0.05 % EX CREA
TOPICAL_CREAM | CUTANEOUS | 1 refills | Status: DC
  Filled 2022-01-13: qty 45, 30d supply, fill #0

## 2022-01-13 MED ORDER — ESTRADIOL 0.1 MG/GM VA CREA
TOPICAL_CREAM | VAGINAL | 5 refills | Status: DC
  Filled 2022-01-13: qty 42.5, 30d supply, fill #0

## 2022-01-13 NOTE — Assessment & Plan Note (Signed)
BP Readings from Last 3 Encounters:  01/13/22 (!) 146/73  09/10/21 130/75  08/09/21 (!) 162/85   Fair bp.  Reports home readings 110/69 usually. Continue amlodipine '5mg'$ .

## 2022-01-13 NOTE — Patient Instructions (Addendum)
Please keep your upcoming appointment with dermatology to look at the spot at the back of your head.  Please complete lab work prior to leaving.

## 2022-01-13 NOTE — Assessment & Plan Note (Signed)
Diet controlled Check A1C 

## 2022-01-13 NOTE — Progress Notes (Signed)
Subjective:   By signing my name below, I, Zite Okoli, attest that this documentation has been prepared under the direction and in the presence of [name], NP 01/13/2022     Patient ID: Sheryl [name], female    DOB: 1946-01-02, 76 y.o.   MRN: [id number]  Chief Complaint  Patient presents with   Immunizations    Will like to get "immunizations updated, shingles and covid booster"   Skin lesion    Reports a small skin lesion on back of neck    HPI Patient is in today for an office visit.  Immunizations- She is interested in updating her shingles and Covid-19 immunizations.  Trelegy- She started using trelegy inhaler once daily instead of twice because two doses causes irritations, makes her "jittery" and she starts to feel kind of "depressed". She supplements with albuterol inhaler.  Urinary problems- She reports that the betamethasone cream helps with the itching on her labia . Notes that she often has to wear a bad because of leaking urine. She is considering seeing a urologist.  Skin- She complains of a raised lesion on the base of her neck that has been present for a couple of months. She thinks it is because of allergies and is very uncomfortable. She has used vitamin E oil and benadryl on the lesion. She would like to see a dermatologist.  She also complains of psoriasis on her shins, vaginal area and hair.   Blood pressure- She checks her blood pressure at home and reports they are stable, systolic normally 782. BP Readings from Last 3 Encounters:  01/13/22 138/75  09/10/21 130/75  08/09/21 (!) 162/85     Ankle swelling- She reports that her ankles get swollen after she stands for long periods of time.She does not think is due to fluid. She thinks it is arthritis because she she has it in her toes. Notes that ice helps to reduce the swelling.  Acid reflux- She notes she is doing well with the reflux. No heartburn or nausea. She notes she only feels  congested.   She is requesting for a refill on estradiol and betamethasone 0.05% creams.   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   [place]   HERNIA REPAIR  10/23/2016   KNEE SURGERY  2006   arthroscopic left knee   KNEE SURGERY  2001   right knee    Family History  Problem Relation Age of Onset   Heart disease Other        CAD   Diabetes Other  Hyperlipidemia Other    Stroke Other     Social History   Socioeconomic History   Marital status: Single    Spouse name: Not on file   Number of children: 1   Years of education: Not on file   Highest education level: Not on file  Occupational History   Occupation: unemployed    Employer: DISABLED  Tobacco Use   Smoking status: Former    Types: Cigarettes    Quit date: 04/25/2021    Years since quitting: 0.7   Smokeless tobacco: Never   Tobacco comments:    smokes occasionally but not everyday  Substance and Sexual Activity   Alcohol use: Yes    Alcohol/week: 1.0 standard drink    Types: 1 Glasses of wine per week    Comment: occasionally   Drug use: Yes    Comment: Smokes "medical grade" marijuana 2 puffs 3 x weekly  for rheumatoid arthritis.per pt   Sexual activity: Never  Other Topics Concern   Not on file  Social History Narrative   Regular exercise:  Stretching exercises. Resistance bands   Caffeine: 1 mug (2cus) daily.      Social Determinants of Health   Financial Resource Strain: Low Risk    Difficulty of Paying Living Expenses: Not hard at all  Food Insecurity: No Food Insecurity   Worried About Charity fundraiser in the Last Year: Never true   [place] in the Last Year: Never true  Transportation Needs: No Transportation Needs   Lack of Transportation (Medical): No   Lack of Transportation (Non-Medical): No  Physical Activity: Sufficiently Active   Days of Exercise per Week: 7 days   Minutes of Exercise per Session: 30 min  Stress: No Stress Concern Present   Feeling of Stress : Not at all  Social Connections: Not on file  Intimate Partner Violence: Not At Risk   Fear of Current or Ex-Partner: No   Emotionally Abused: No   Physically Abused: No   Sexually Abused: No    Outpatient Medications Prior to Visit  Medication Sig Dispense Refill   albuterol (VENTOLIN HFA) 108 (90 Base) MCG/ACT inhaler Inhale 2 puffs into the lungs every 6 (six) hours as needed for wheezing or shortness of breath. 54 g 1   amLODipine (NORVASC) 5 MG tablet TAKE 1 TABLET ONCE DAILY 90 tablet 1   aspirin 81 MG tablet Take 81 mg by mouth daily.     fluticasone (FLONASE) 50 MCG/ACT nasal spray Place 2 sprays into both nostrils daily as needed for allergies or rhinitis. 16 g 0   Fluticasone-Umeclidin-Vilant (TRELEGY ELLIPTA) 200-62.5-25 MCG/ACT AEPB Inhale 1 puff into the lungs in the morning and at bedtime. 60 each 5   levothyroxine (SYNTHROID) 100 MCG tablet TAKE 1 TABLET DAILY 90 tablet 1   montelukast (SINGULAIR) 10 MG tablet TAKE 1 TABLET AT BEDTIME 90 tablet 1   multivitamin (THERAGRAN) per tablet Take 1 tablet by mouth daily.     NON FORMULARY TUMERIC MILK.     NON FORMULARY CBD. Derivative of  marijuana without the marijuana.     pantoprazole (PROTONIX) 40 MG tablet TAKE 1 TABLET DAILY 90 tablet 3   Probiotic Product (PROBIOTIC DAILY PO) Take 1 capsule by mouth daily.     VOLTAREN 1 % GEL APPLY 2 GRAMS TOPICALLY 4 TIMES AS NEEDED 100 g 2   betamethasone dipropionate 0.05 % cream APPLY TOPICALLY TWO TIMES ADAY 30 g 1   estradiol (ESTRACE  VAGINAL) 0.1 MG/GM vaginal cream 2g daily intravaginally for 1 week, then reduce to 1 gram PV daily for 1 week , followed by a maintenance dose of 1 g PV once weekly 42.5 g 5   predniSONE (DELTASONE) 10 MG tablet Take 1 tablet by mouth once daily for 1 week. Then keep additional tablets on hand. 14 tablet 0   traZODone (DESYREL) 50 MG tablet 1/2 to 1 tab by mouth at bedtime as needed for insomnia 30 tablet 1   No facility-administered medications prior to visit.    Allergies  Allergen Reactions   Levofloxacin Shortness Of Breath and Rash   Mold Extract [Trichophyton] Anaphylaxis   Ativan [Lorazepam]     Visual hallucinations.  Visual hallucinations.    Cefuroxime Axetil     REACTION: asthma, cough   Hydrocodone Bit-Homatrop Mbr Nausea Only   Lisinopril     Diarrhea hives   Rosuvastatin Calcium     Muscle pain, syncope   Statins     Muscle pain, syncope   Zetia [Ezetimibe] Diarrhea   Hydrocodone-Acetaminophen Nausea Only    Review of Systems  HENT:  Positive for congestion.   Cardiovascular:  Negative for leg swelling.  Genitourinary:  Negative for frequency and urgency.       (+) "leaky" urine   Musculoskeletal:  Negative for myalgias.       (+) ankle swelling  Skin:        (+) lesion (+) psoriasis       Objective:    Physical Exam Constitutional:      General: She is not in acute distress.    Appearance: Normal appearance. She is not ill-appearing.  HENT:     Head: Normocephalic and atraumatic.     Right Ear: External ear normal.     Left Ear: External ear normal.  Cardiovascular:     Rate and Rhythm: Normal rate and  regular rhythm.     Pulses: Normal pulses.     Heart sounds: Normal heart sounds. No murmur heard. Pulmonary:     Effort: Pulmonary effort is normal. No respiratory distress.     Breath sounds: Normal breath sounds. No wheezing or rhonchi.  Feet:     Comments: Diabetic Foot Exam - Simple   No data filed    Skin:    General: Skin is warm and dry.     Findings: Lesion present.     Comments: Raised lesion at base of scalp  Psoriasis right shin   Neurological:     Mental Status: She is alert and oriented to person, place, and time.  Psychiatric:        Behavior: Behavior normal.        Judgment: Judgment normal.    BP 138/75 (BP Location: Left Arm, Patient Position: Sitting)   Pulse 75   Temp 98.4 F (36.9 C) (Oral)   Resp 16   Wt 189 lb (85.7 kg)   SpO2 99%   BMI 34.57 kg/m  Wt Readings from Last 3 Encounters:  01/13/22 189 lb (85.7 kg)  09/10/21 186 lb (84.4 kg)  08/09/21 185 lb (83.9 kg)    Diabetic Foot Exam - Simple   Simple Foot Form Diabetic Foot exam was performed with the following findings: Yes 01/13/2022 12:18 PM  Visual Inspection No deformities, no ulcerations, no other skin breakdown bilaterally: Yes Sensation Testing Intact to touch and monofilament testing bilaterally: Yes Pulse Check Posterior Tibialis and Dorsalis pulse intact bilaterally: Yes Comments  Assessment & Plan:   Problem List Items Addressed This Visit       Unprioritized   Vaginal atrophy    Continue estrace cream.        Hypothyroidism    Lab Results  Component Value Date   TSH 0.79 09/10/2021  Stable on sythroid 120mg once daily.       Relevant Orders   TSH   Hypertension    BP Readings from Last 3 Encounters:  01/13/22 (!) 146/73  09/10/21 130/75  08/09/21 (!) 162/85  Fair bp.  Reports home readings 110/69 usually. Continue amlodipine 511m       Relevant Orders   Comp Met (CMET)   GERD    Stable on protonix. Continue same.        E[place]atic aorta ([place]    Plan to repeat [place] July.        Diabetes type 2, controlled ([facility]   Diet controlled. Check A1C.        Relevant Orders   Urine Microalbumin w/creat. ratio   Hemoglobin A1c   Comp Met (CMET)   Asthma    Fair control. Can only afford to use trellegy once daily but that seems to hold her.          Meds ordered this encounter  Medications   Zoster Vaccine Adjuvanted ([facility]    Sig: Inject 0.93m80minto the muscle now and then again in 2 - 6 months.    Dispense:  0.5 mL    Refill:  1    Order Specific Question:   Supervising Provider    Answer:   BLYPenni Homans[[id number]]   estradiol (ESTRACE VAGINAL) 0.1 MG/GM vaginal cream    Sig: Apply 2 grams into the vagina daily for 1 week, then reduce to 1 gram vaginally daily for 1 week, then maintain at 1 gram once weekly    Dispense:  42.5 g    Refill:  5    Order Specific Question:   Supervising Provider    Answer:   BLYPenni Homans[[id number]]   betamethasone dipropionate 0.05 % cream    Sig: Apply on to the skin 2 times daily    Dispense:  45 g    Refill:  1    Order Specific Question:   Supervising Provider    Answer:   BLYPenni Homans[[id number]]    I,Zite Okoli,acting as a scribe for MelNance PearP.,have documented all relevant documentation on the behalf of MelNance PearP,as directed by  MelNance PearP while in the presence of MelNance PearP.   I, O'SDebbrah AlarP, personally preformed the services described in this documentation.  All medical record entries made by the scribe were at my direction and in my presence.  I have reviewed the chart and discharge instructions (if applicable) and agree that the record reflects my personal performance and is accurate and complete. 01/13/2022

## 2022-01-13 NOTE — Assessment & Plan Note (Signed)
Stable on protonix.  Continue same.  

## 2022-01-13 NOTE — Assessment & Plan Note (Signed)
Lab Results  Component Value Date   TSH 0.79 09/10/2021   Stable on sythroid 120mg once daily.

## 2022-01-13 NOTE — Assessment & Plan Note (Signed)
Fair control. Can only afford to use trellegy once daily but that seems to hold her.

## 2022-01-13 NOTE — Assessment & Plan Note (Signed)
Plan to repeat US in July.

## 2022-01-13 NOTE — Progress Notes (Signed)
   Covid-19 Vaccination Clinic  Name:  Sheryl [name]    MRN: [id number] DOB: Apr 27, 1946  01/13/2022  [name] was observed post Covid-19 immunization for 15 minutes without incident. She was provided with Vaccine Information Sheet and instruction to access the V-Safe system.   [name] was instructed to call 911 with any severe reactions post vaccine: Difficulty breathing  Swelling of face and throat  A fast heartbeat  A bad rash all over body  Dizziness and weakness   Immunizations Administered     Name Date Dose VIS Date Route   Moderna Covid-19 vaccine Bivalent Booster 01/13/2022 12:52 PM 0.5 mL 04/06/2021 Intramuscular   Manufacturer: Levan Hurst   Lot: [id number]   [place]: [id number]

## 2022-01-13 NOTE — Assessment & Plan Note (Signed)
Continue estrace cream.

## 2022-01-16 DIAGNOSIS — H524 Presbyopia: Secondary | ICD-10-CM | POA: Diagnosis not present

## 2022-01-16 DIAGNOSIS — H2513 Age-related nuclear cataract, bilateral: Secondary | ICD-10-CM | POA: Diagnosis not present

## 2022-01-16 DIAGNOSIS — G43809 Other migraine, not intractable, without status migrainosus: Secondary | ICD-10-CM | POA: Diagnosis not present

## 2022-01-16 DIAGNOSIS — H04123 Dry eye syndrome of bilateral lacrimal glands: Secondary | ICD-10-CM | POA: Diagnosis not present

## 2022-01-16 DIAGNOSIS — R7303 Prediabetes: Secondary | ICD-10-CM | POA: Diagnosis not present

## 2022-01-16 DIAGNOSIS — H52223 Regular astigmatism, bilateral: Secondary | ICD-10-CM | POA: Diagnosis not present

## 2022-01-16 DIAGNOSIS — H5213 Myopia, bilateral: Secondary | ICD-10-CM | POA: Diagnosis not present

## 2022-01-16 LAB — HM DIABETES EYE EXAM

## 2022-02-03 ENCOUNTER — Other Ambulatory Visit (HOSPITAL_BASED_OUTPATIENT_CLINIC_OR_DEPARTMENT_OTHER): Payer: Self-pay

## 2022-02-03 ENCOUNTER — Other Ambulatory Visit: Payer: Self-pay | Admitting: Family

## 2022-02-03 MED ORDER — FLUTICASONE PROPIONATE 50 MCG/ACT NA SUSP
2.0000 | Freq: Every day | NASAL | 5 refills | Status: DC | PRN
  Filled 2022-02-03: qty 16, 30d supply, fill #0

## 2022-02-07 ENCOUNTER — Other Ambulatory Visit (HOSPITAL_BASED_OUTPATIENT_CLINIC_OR_DEPARTMENT_OTHER): Payer: Self-pay

## 2022-02-07 ENCOUNTER — Ambulatory Visit (INDEPENDENT_AMBULATORY_CARE_PROVIDER_SITE_OTHER): Payer: Medicare Other

## 2022-02-07 DIAGNOSIS — E538 Deficiency of other specified B group vitamins: Secondary | ICD-10-CM

## 2022-02-07 MED ORDER — CYANOCOBALAMIN 1000 MCG/ML IJ SOLN
1000.0000 ug | Freq: Once | INTRAMUSCULAR | Status: AC
  Administered 2022-02-07: 1000 ug via INTRAMUSCULAR

## 2022-02-07 NOTE — Progress Notes (Signed)
  Sheryl [name] is a 76 y.o. female presents to the office today for monthly b12 injection per physician's orders.  Cyanocobalamin 1,000 mcg, IM was administered left deltoid today. Patient tolerated injection.  Patient next injection due: one month, appt made Yes

## 2022-02-17 ENCOUNTER — Other Ambulatory Visit (HOSPITAL_BASED_OUTPATIENT_CLINIC_OR_DEPARTMENT_OTHER): Payer: Self-pay

## 2022-02-24 ENCOUNTER — Telehealth (HOSPITAL_BASED_OUTPATIENT_CLINIC_OR_DEPARTMENT_OTHER): Payer: Self-pay

## 2022-02-24 ENCOUNTER — Telehealth: Payer: Self-pay | Admitting: Family

## 2022-02-24 DIAGNOSIS — I714 Abdominal aortic aneurysm, without rupture, unspecified: Secondary | ICD-10-CM

## 2022-02-24 NOTE — Telephone Encounter (Signed)
Patient advised of plan of care. She has a message from radiology and will call to set up

## 2022-02-24 NOTE — Telephone Encounter (Signed)
Please advise pt that I would like for her to complete an ultrasound to re-evaluate some slight widening of her aorta which was noted on [place] back in 2018.  Radiology recommended a 5 year follow up.

## 2022-03-06 ENCOUNTER — Other Ambulatory Visit (HOSPITAL_BASED_OUTPATIENT_CLINIC_OR_DEPARTMENT_OTHER): Payer: Self-pay

## 2022-03-07 ENCOUNTER — Ambulatory Visit (INDEPENDENT_AMBULATORY_CARE_PROVIDER_SITE_OTHER): Payer: Medicare Other

## 2022-03-07 DIAGNOSIS — E538 Deficiency of other specified B group vitamins: Secondary | ICD-10-CM | POA: Diagnosis not present

## 2022-03-07 MED ORDER — CYANOCOBALAMIN 1000 MCG/ML IJ SOLN
1000.0000 ug | Freq: Once | INTRAMUSCULAR | Status: AC
  Administered 2022-03-07: 1000 ug via INTRAMUSCULAR

## 2022-03-07 NOTE — Progress Notes (Signed)
Sheryl [name] is a 76 y.o. female  presents to the office today for a B12 injection, per physician's orders. Original order: per [name], NP  cyanocobalamin, 1000 mg/ml IM was administered in right deltoid today.   Patient tolerated injection well.   Next appointment:  04/11/22

## 2022-04-07 ENCOUNTER — Other Ambulatory Visit (HOSPITAL_BASED_OUTPATIENT_CLINIC_OR_DEPARTMENT_OTHER): Payer: Self-pay

## 2022-04-10 ENCOUNTER — Ambulatory Visit (HOSPITAL_BASED_OUTPATIENT_CLINIC_OR_DEPARTMENT_OTHER)
Admission: RE | Admit: 2022-04-10 | Discharge: 2022-04-10 | Disposition: A | Discharge status: Home / Self Care | Payer: Medicare Other | Source: Ambulatory Visit | Attending: Family | Admitting: Family

## 2022-04-10 ENCOUNTER — Other Ambulatory Visit (HOSPITAL_BASED_OUTPATIENT_CLINIC_OR_DEPARTMENT_OTHER): Payer: Medicare Other

## 2022-04-10 DIAGNOSIS — Z136 Encounter for screening for cardiovascular disorders: Secondary | ICD-10-CM | POA: Diagnosis not present

## 2022-04-10 DIAGNOSIS — I714 Abdominal aortic aneurysm, without rupture, unspecified: Secondary | ICD-10-CM | POA: Diagnosis not present

## 2022-04-11 ENCOUNTER — Ambulatory Visit (INDEPENDENT_AMBULATORY_CARE_PROVIDER_SITE_OTHER): Payer: Medicare Other | Admitting: *Deleted

## 2022-04-11 DIAGNOSIS — E538 Deficiency of other specified B group vitamins: Secondary | ICD-10-CM

## 2022-04-11 MED ORDER — CYANOCOBALAMIN 1000 MCG/ML IJ SOLN
1000.0000 ug | Freq: Once | INTRAMUSCULAR | Status: AC
  Administered 2022-04-11: 1000 ug via INTRAMUSCULAR

## 2022-04-11 NOTE — Progress Notes (Signed)
Sheryl [name] is a 76 y.o. female  presents to the office today for a B12 injection, per physician's orders. Original order: per [name], NP   Cyanocobalamin, 1000 mg/ml IM was administered in left deltoid today. Injection tolerated well.  Next appointment: 05/16/22

## 2022-04-22 ENCOUNTER — Telehealth: Payer: Self-pay | Admitting: Family

## 2022-04-22 NOTE — Telephone Encounter (Signed)
Ultrasound shows stable aneurysm.  Given the size of the aneurysm, it is recommended that it be repeated in 5 years.

## 2022-04-22 NOTE — Telephone Encounter (Signed)
Pt is needing to go over imaging results.

## 2022-04-24 NOTE — Telephone Encounter (Signed)
Patient advised of provider's comments. She verbalizes understanding

## 2022-04-30 ENCOUNTER — Telehealth: Payer: Self-pay | Admitting: Family

## 2022-04-30 DIAGNOSIS — U071 COVID-19: Secondary | ICD-10-CM

## 2022-04-30 NOTE — Telephone Encounter (Signed)
Pt called stating she had taken a positive COVID test on 9.6.23 but her symptoms started on 8.31.23 with a headache, sore throat, and congestion. Pt is past the 5 day mark for administering Paxlovid and is not sure if there is anything else she can do to manage symptoms. Please Advise.

## 2022-05-01 NOTE — Telephone Encounter (Signed)
Patient reports she is feeling "much better,  barely having any respiratory symptoms and just some body aches and fatigue. Taking ibuprofen and it helps"  She will continue to rest and stay hydrated and will call us back if any other symptoms develop.

## 2022-05-05 ENCOUNTER — Other Ambulatory Visit (HOSPITAL_BASED_OUTPATIENT_CLINIC_OR_DEPARTMENT_OTHER): Payer: Self-pay

## 2022-05-05 NOTE — Telephone Encounter (Signed)
Pt would like to know if she could get medication to help with covid. Please advise she is still congested and lethargic.   [facility]   [address], [place], [place] [address]  Phone:  [phone]  Fax:  [phone]

## 2022-05-05 NOTE — Telephone Encounter (Signed)
Overall feeling better but has had some intermittent fever. Overall feels like her energy is up and down.  Has yellow drainage in the back of her throat.  Recommended that she complete chest x-ray tomorrow to rule out pneumonia. Pt is agreeable.

## 2022-05-06 ENCOUNTER — Ambulatory Visit (HOSPITAL_BASED_OUTPATIENT_CLINIC_OR_DEPARTMENT_OTHER)
Admission: RE | Admit: 2022-05-06 | Discharge: 2022-05-06 | Disposition: A | Discharge status: Home / Self Care | Payer: Medicare Other | Source: Ambulatory Visit | Attending: Family | Admitting: Family

## 2022-05-06 DIAGNOSIS — U071 COVID-19: Secondary | ICD-10-CM | POA: Insufficient documentation

## 2022-05-06 DIAGNOSIS — J439 Emphysema, unspecified: Secondary | ICD-10-CM | POA: Diagnosis not present

## 2022-05-07 NOTE — Telephone Encounter (Signed)
Pt is wanting to go over her x-ray more in depth and see about medication to help with the congestion. Advised her to make an appt but there were no openings until next week. Please advise if pt can be squeezed in this week.

## 2022-05-08 NOTE — Telephone Encounter (Signed)
Can you please see if she can come 9/15 at 3PM?

## 2022-05-09 ENCOUNTER — Ambulatory Visit (INDEPENDENT_AMBULATORY_CARE_PROVIDER_SITE_OTHER): Payer: Medicare Other | Admitting: Family

## 2022-05-09 VITALS — BP 130/67 | HR 84 | Temp 98.0°F | Resp 16 | Wt 182.0 lb

## 2022-05-09 DIAGNOSIS — Z8616 Personal history of COVID-19: Secondary | ICD-10-CM

## 2022-05-09 DIAGNOSIS — J4520 Mild intermittent asthma, uncomplicated: Secondary | ICD-10-CM | POA: Diagnosis not present

## 2022-05-09 HISTORY — DX: Personal history of COVID-19: Z86.16

## 2022-05-09 NOTE — Progress Notes (Signed)
Subjective:   By signing my name below, I, Sheryl [name], attest that this documentation has been prepared under the direction and in the presence of Sheryl Chimera, NP 05/09/2022   Patient ID: Sheryl [name], female    DOB: Jun 19, 1946, 76 y.o.   MRN: [id number]  Chief Complaint  Patient presents with   Cough    Complains of still having cough after covid 2 weeks ago    HPI Patient is in today for an office visit  Cough: She complains of a cough. She tested positive for Covid on 04/24/2022. As of today, her cough is worse in the morning. She tends to spit up phlegm. She states that her coughs are usually dry. Her breathing feels tight but she contributes that to a sedentary lifestyle that she had for last three weeks. She is taking OTC Mucinex Max which helps alleviate her symptoms. She notes that she experiences severe allergies. She is currently taking 10 Mg of Singulair, Trelegy Ellipta, and Albuterol. She denies of any fever or sore throat.   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   [place]   HERNIA REPAIR  10/23/2016   KNEE SURGERY  2006   arthroscopic left knee   KNEE SURGERY  2001   right knee    Family History  Problem Relation Age of Onset   Heart disease Other        CAD   Diabetes Other    Hyperlipidemia Other    Stroke Other     Social History   Socioeconomic History   Marital status: Single    Spouse name: Not on file   Number of children: 1   Years of education: Not on file   Highest education level: Not on file  Occupational History   Occupation: unemployed    Employer: DISABLED  Tobacco Use   Smoking status: Former    Types: Cigarettes    Quit date: 04/25/2021    Years since quitting: 1.0   Smokeless tobacco: Never   Tobacco comments:    smokes occasionally but not everyday  Substance and Sexual Activity   Alcohol use: Yes    Alcohol/week: 1.0 standard drink of alcohol    Types: 1 Glasses of wine per week    Comment: occasionally   Drug use: Yes  Comment: Smokes "medical grade" marijuana 2 puffs 3 x weekly for rheumatoid arthritis.per pt   Sexual activity: Never  Other Topics Concern   Not on file  Social History Narrative   Regular exercise:  Stretching exercises. Resistance bands   Caffeine: 1 mug (2cus) daily.      Social Determinants of Health   Financial Resource Strain: Low Risk  (08/06/2021)   Overall Financial Resource Strain (CARDIA)    Difficulty of Paying Living Expenses: Not hard at all  Food Insecurity: No Food Insecurity (08/06/2021)   Hunger Vital Sign    Worried About Running Out of Food in the Last Year: Never true    Ran Out of Food in the Last Year: Never true  Transportation Needs: No Transportation Needs (08/06/2021)   PRAPARE - Hydrologist (Medical): No    Lack of Transportation (Non-Medical): No  Physical Activity: Sufficiently Active  (08/06/2021)   Exercise Vital Sign    Days of Exercise per Week: 7 days    Minutes of Exercise per Session: 30 min  Stress: No Stress Concern Present (08/06/2021)   [place]    Feeling of Stress : Not at all  Social Connections: Moderately Isolated (07/26/2020)   Social Connection and Isolation Panel [NHANES]    Frequency of Communication with Friends and Family: More than three times a week    Frequency of Social Gatherings with Friends and Family: Once a week    Attends Religious Services: Never    Marine scientist or Organizations: Yes    Attends Archivist Meetings: 1 to 4 times per year    Marital Status: Divorced  Human resources officer Violence: Not At Risk (08/06/2021)   Humiliation, Afraid, Rape, and Kick questionnaire    Fear of Current or Ex-Partner: No    Emotionally Abused: No    Physically Abused: No    Sexually Abused: No    Outpatient Medications Prior to Visit  Medication Sig Dispense Refill   albuterol (VENTOLIN HFA) 108 (90 Base) MCG/ACT inhaler Inhale 2 puffs into the lungs every 6 (six) hours as needed for wheezing or shortness of breath. 54 g 1   amLODipine (NORVASC) 5 MG tablet TAKE 1 TABLET ONCE DAILY 90 tablet 1   aspirin 81 MG tablet Take 81 mg by mouth daily.     Fluticasone-Umeclidin-Vilant (TRELEGY ELLIPTA) 200-62.5-25 MCG/ACT AEPB Inhale 1 puff into the lungs in the morning and at bedtime. 60 each 5   levothyroxine (SYNTHROID) 100 MCG tablet TAKE 1 TABLET DAILY 90 tablet 1   montelukast (SINGULAIR) 10 MG tablet TAKE 1 TABLET AT BEDTIME 90 tablet 1   multivitamin (THERAGRAN) per tablet Take 1 tablet by mouth daily.     NON FORMULARY TUMERIC MILK.     NON FORMULARY CBD. Derivative of marijuana without the marijuana.     pantoprazole (PROTONIX) 40 MG tablet TAKE 1 TABLET DAILY 90 tablet 3   Probiotic Product (PROBIOTIC DAILY PO) Take 1 capsule by mouth daily.     VOLTAREN 1 %  GEL APPLY 2 GRAMS TOPICALLY 4 TIMES AS NEEDED 100 g 2   betamethasone dipropionate 0.05 % cream Apply on to the skin 2 times daily 45 g 1   COVID-19 mRNA bivalent vaccine, Moderna, (MODERNA COVID-19 BIVAL BOOSTER) 50 MCG/0.5ML injection Inject into the muscle. 0.5 mL 0   estradiol (ESTRACE VAGINAL) 0.1 MG/GM vaginal cream Apply 2 grams into the vagina daily for  1 week, then reduce to 1 gram vaginally daily for 1 week, then maintain at 1 gram once weekly 42.5 g 5   fluticasone (FLONASE) 50 MCG/ACT nasal spray Place 2 sprays into both nostrils daily as needed for allergies or rhinitis. 16 g 5   Zoster Vaccine Adjuvanted [facility]) injection Inject 0.44mg into the muscle now and then again in 2 - 6 months. 0.5 mL 1   No facility-administered medications prior to visit.    Allergies  Allergen Reactions   Levofloxacin Shortness Of Breath and Rash   Mold Extract [Trichophyton] Anaphylaxis   Ativan [Lorazepam]     Visual hallucinations.  Visual hallucinations.    Cefuroxime Axetil     REACTION: asthma, cough   Hydrocodone Bit-Homatrop Mbr Nausea Only   Lisinopril     Diarrhea hives   Rosuvastatin Calcium     Muscle pain, syncope   Statins     Muscle pain, syncope   Zetia [Ezetimibe] Diarrhea   Hydrocodone-Acetaminophen Nausea Only    Review of Systems  Constitutional:  Negative for fever.  HENT:  Negative for sore throat.   Respiratory:  Positive for cough and sputum production.        Objective:    Physical Exam Constitutional:      General: She is not in acute distress.    Appearance: Normal appearance. She is not ill-appearing.  HENT:     Head: Normocephalic and atraumatic.     Right Ear: Tympanic membrane, ear canal and external ear normal.     Left Ear: Tympanic membrane, ear canal and external ear normal.     Mouth/Throat:     Pharynx: No oropharyngeal exudate.  Eyes:     Extraocular Movements: Extraocular movements intact.     Pupils: Pupils are equal, round, and  reactive to light.  Cardiovascular:     Rate and Rhythm: Normal rate and regular rhythm.     Heart sounds: Normal heart sounds. No murmur heard.    No gallop.  Pulmonary:     Effort: Pulmonary effort is normal. No respiratory distress.     Breath sounds: Normal breath sounds. No wheezing or rales.  Skin:    General: Skin is warm and dry.  Neurological:     Mental Status: She is alert and oriented to person, place, and time.  Psychiatric:        Mood and Affect: Mood normal.        Behavior: Behavior normal.        Judgment: Judgment normal.     BP 130/67 (BP Location: Right Arm, Patient Position: Sitting, Cuff Size: Large)   Pulse 84   Temp 98 F (36.7 C) (Oral)   Resp 16   Wt 182 lb (82.6 kg)   SpO2 98%   BMI 33.29 kg/m  Wt Readings from Last 3 Encounters:  05/09/22 182 lb (82.6 kg)  01/13/22 189 lb (85.7 kg)  09/10/21 186 lb (84.4 kg)       Assessment & Plan:   Problem List Items Addressed This Visit       Unprioritized   History of COVID-19 - Primary    Overall symptoms are improving. CXR is clear.  She is having a dry cough which is improving. Some mucous at the back of her throat at times.  No sinus pain/pressure. Suspect that mucous is related to recent COVID infection, COPD and superimposed allergy symptoms. I don't think that antibiotics are indicated at this time.  She is advised to  let us know if symptoms worsen, if she develops fever, or if cough does not continue to improve. Recommended plain mucinex prn congestion.       Asthma    Stable. Using trellegy only once a day which seems to be controlling her symptoms.       No orders of the defined types were placed in this encounter.   I, [name], NP, personally preformed the services described in this documentation.  All medical record entries made by the scribe were at my direction and in my presence.  I have reviewed the chart and discharge instructions (if applicable) and agree that the  record reflects my personal performance and is accurate and complete. 05/09/2022   I,Amber Collins,acting as a scribe for [name], NP.,have documented all relevant documentation on the behalf of [name], NP,as directed by  [name], NP while in the presence of [name], NP.    [name], NP

## 2022-05-09 NOTE — Assessment & Plan Note (Signed)
Stable. Using trellegy only once a day which seems to be controlling her symptoms.

## 2022-05-09 NOTE — Assessment & Plan Note (Addendum)
Overall symptoms are improving. CXR is clear.  She is having a dry cough which is improving. Some mucous at the back of her throat at times.  No sinus pain/pressure. Suspect that mucous is related to recent COVID infection, COPD and superimposed allergy symptoms. I don't think that antibiotics are indicated at this time.  She is advised to let us know if symptoms worsen, if she develops fever, or if cough does not continue to improve. Recommended plain mucinex prn congestion.

## 2022-05-09 NOTE — Telephone Encounter (Signed)
Patient will come in this afternoon at 3 pm

## 2022-05-16 ENCOUNTER — Other Ambulatory Visit: Payer: Self-pay

## 2022-05-16 ENCOUNTER — Ambulatory Visit (INDEPENDENT_AMBULATORY_CARE_PROVIDER_SITE_OTHER): Payer: Medicare Other

## 2022-05-16 DIAGNOSIS — Z23 Encounter for immunization: Secondary | ICD-10-CM | POA: Diagnosis not present

## 2022-05-16 DIAGNOSIS — E538 Deficiency of other specified B group vitamins: Secondary | ICD-10-CM

## 2022-05-16 MED ORDER — CYANOCOBALAMIN 1000 MCG/ML IJ SOLN
1000.0000 ug | Freq: Once | INTRAMUSCULAR | Status: AC
  Administered 2022-05-16: 1000 ug via INTRAMUSCULAR

## 2022-05-16 NOTE — Progress Notes (Signed)
  Sheryl [name] is a 76 y.o. female presents to the office today for a B12 injection, per physician's orders. Original order: per [name], NP  Cyanocobalamin, 1000 mg/ml IM was administered in left deltoid today. Injection tolerated well.  Next appointment: 05/16/22      Pt here for Influenza shot   Pt tolerated it well left deltoid

## 2022-05-29 ENCOUNTER — Other Ambulatory Visit (HOSPITAL_BASED_OUTPATIENT_CLINIC_OR_DEPARTMENT_OTHER): Payer: Self-pay

## 2022-05-29 DIAGNOSIS — L218 Other seborrheic dermatitis: Secondary | ICD-10-CM | POA: Diagnosis not present

## 2022-05-29 DIAGNOSIS — L280 Lichen simplex chronicus: Secondary | ICD-10-CM | POA: Diagnosis not present

## 2022-05-29 DIAGNOSIS — E669 Obesity, unspecified: Secondary | ICD-10-CM | POA: Diagnosis not present

## 2022-05-29 MED ORDER — CLOBETASOL PROPIONATE 0.05 % EX SOLN
CUTANEOUS | 2 refills | Status: DC
  Filled 2022-05-29: qty 50, 30d supply, fill #0

## 2022-05-29 MED ORDER — CLOBETASOL PROPIONATE 0.05 % EX CREA
TOPICAL_CREAM | CUTANEOUS | 0 refills | Status: DC
  Filled 2022-05-29: qty 60, 30d supply, fill #0

## 2022-05-30 ENCOUNTER — Other Ambulatory Visit: Payer: Self-pay | Admitting: Family

## 2022-06-02 ENCOUNTER — Other Ambulatory Visit (HOSPITAL_BASED_OUTPATIENT_CLINIC_OR_DEPARTMENT_OTHER): Payer: Self-pay

## 2022-06-06 ENCOUNTER — Other Ambulatory Visit (HOSPITAL_BASED_OUTPATIENT_CLINIC_OR_DEPARTMENT_OTHER): Payer: Self-pay

## 2022-06-13 ENCOUNTER — Ambulatory Visit (INDEPENDENT_AMBULATORY_CARE_PROVIDER_SITE_OTHER): Payer: Medicare Other

## 2022-06-13 DIAGNOSIS — E538 Deficiency of other specified B group vitamins: Secondary | ICD-10-CM

## 2022-06-13 MED ORDER — CYANOCOBALAMIN 1000 MCG/ML IJ SOLN
1000.0000 ug | Freq: Once | INTRAMUSCULAR | Status: AC
  Administered 2022-06-13: 1000 ug via INTRAMUSCULAR

## 2022-06-13 NOTE — Progress Notes (Signed)
Sheryl [name] is a 76 y.o. female presents to the office today for a B12 injection, per physician's orders. Original order: per [name], NP  Cyanocobalamin, 1000 mg/ml IM was administered in left deltoid today. Injection tolerated well.   Appointment made for 07/15/22 @ 11am

## 2022-06-19 ENCOUNTER — Other Ambulatory Visit: Payer: Self-pay | Admitting: Family

## 2022-06-19 ENCOUNTER — Other Ambulatory Visit (HOSPITAL_BASED_OUTPATIENT_CLINIC_OR_DEPARTMENT_OTHER): Payer: Self-pay

## 2022-06-19 ENCOUNTER — Telehealth: Payer: Self-pay

## 2022-06-19 MED ORDER — TRELEGY ELLIPTA 200-62.5-25 MCG/ACT IN AEPB
1.0000 | INHALATION_SPRAY | Freq: Two times a day (BID) | RESPIRATORY_TRACT | 3 refills | Status: DC
  Filled 2022-06-19 – 2022-06-27 (×2): qty 120, 30d supply, fill #0

## 2022-06-19 NOTE — Telephone Encounter (Signed)
Pa submited to cover my meds as requested, answer from them said No prior authorization needed. Information forwarded to pharmacy

## 2022-06-20 ENCOUNTER — Other Ambulatory Visit (HOSPITAL_BASED_OUTPATIENT_CLINIC_OR_DEPARTMENT_OTHER): Payer: Self-pay

## 2022-06-23 ENCOUNTER — Other Ambulatory Visit (HOSPITAL_BASED_OUTPATIENT_CLINIC_OR_DEPARTMENT_OTHER): Payer: Self-pay

## 2022-06-24 ENCOUNTER — Other Ambulatory Visit (HOSPITAL_BASED_OUTPATIENT_CLINIC_OR_DEPARTMENT_OTHER): Payer: Self-pay

## 2022-06-24 ENCOUNTER — Telehealth: Payer: Self-pay | Admitting: *Deleted

## 2022-06-24 NOTE — Telephone Encounter (Signed)
Prior auth started via cover my meds.  Awaiting determination.  Key: [id number]

## 2022-06-24 NOTE — Telephone Encounter (Signed)
Form received from [facility] and faxed back.

## 2022-06-26 NOTE — Telephone Encounter (Signed)
Prior auth approved 06/19/22 until further notice

## 2022-06-27 ENCOUNTER — Other Ambulatory Visit (HOSPITAL_BASED_OUTPATIENT_CLINIC_OR_DEPARTMENT_OTHER): Payer: Self-pay

## 2022-06-27 MED ORDER — TRELEGY ELLIPTA 200-62.5-25 MCG/ACT IN AEPB: 1.0000 | INHALATION_SPRAY | Freq: Every day | RESPIRATORY_TRACT | 3 refills | Status: DC

## 2022-06-27 MED ORDER — TRELEGY ELLIPTA 200-62.5-25 MCG/ACT IN AEPB
1.0000 | INHALATION_SPRAY | Freq: Every day | RESPIRATORY_TRACT | 3 refills | Status: DC
  Filled 2022-06-27: qty 60, fill #0

## 2022-06-27 NOTE — Addendum Note (Signed)
Addended by: [name] on: 06/27/2022 11:56 AM   Modules accepted: Orders

## 2022-06-27 NOTE — Telephone Encounter (Signed)
Spoke with pharmacy about approval.  Pharmacist stated that medication will be over $300 with insurance, but for 2 inhalers at a time.  He wanted to clarify that you want her on this medication twice a day?.  This inhaler is usually once a day.

## 2022-07-02 ENCOUNTER — Other Ambulatory Visit (HOSPITAL_BASED_OUTPATIENT_CLINIC_OR_DEPARTMENT_OTHER): Payer: Self-pay

## 2022-07-07 ENCOUNTER — Other Ambulatory Visit (HOSPITAL_BASED_OUTPATIENT_CLINIC_OR_DEPARTMENT_OTHER): Payer: Self-pay

## 2022-07-07 MED ORDER — IBUPROFEN 600 MG PO TABS
600.0000 mg | ORAL_TABLET | Freq: Four times a day (QID) | ORAL | 0 refills | Status: AC | PRN
  Filled 2022-07-07: qty 20, 5d supply, fill #0

## 2022-07-07 MED ORDER — CLINDAMYCIN HCL 150 MG PO CAPS
ORAL_CAPSULE | ORAL | 0 refills | Status: DC
  Filled 2022-07-07: qty 19, 5d supply, fill #0

## 2022-07-10 ENCOUNTER — Other Ambulatory Visit (HOSPITAL_BASED_OUTPATIENT_CLINIC_OR_DEPARTMENT_OTHER): Payer: Self-pay

## 2022-07-15 ENCOUNTER — Other Ambulatory Visit (HOSPITAL_BASED_OUTPATIENT_CLINIC_OR_DEPARTMENT_OTHER): Payer: Self-pay

## 2022-07-15 ENCOUNTER — Other Ambulatory Visit (INDEPENDENT_AMBULATORY_CARE_PROVIDER_SITE_OTHER): Payer: Medicare Other

## 2022-07-15 ENCOUNTER — Ambulatory Visit (INDEPENDENT_AMBULATORY_CARE_PROVIDER_SITE_OTHER): Payer: Medicare Other

## 2022-07-15 ENCOUNTER — Other Ambulatory Visit: Payer: Self-pay

## 2022-07-15 DIAGNOSIS — E538 Deficiency of other specified B group vitamins: Secondary | ICD-10-CM

## 2022-07-15 DIAGNOSIS — Z23 Encounter for immunization: Secondary | ICD-10-CM | POA: Diagnosis not present

## 2022-07-15 LAB — B12 AND FOLATE PANEL
Folate: >23.8 ng/mL (ref >5.9)
Vitamin B-12: 559 pg/mL (ref 211–911)

## 2022-07-15 MED ORDER — COMIRNATY 30 MCG/0.3ML IM SUSY
PREFILLED_SYRINGE | INTRAMUSCULAR | 0 refills | Status: DC
  Filled 2022-07-15: qty 0.3, 1d supply, fill #0

## 2022-07-15 MED ORDER — CYANOCOBALAMIN 1000 MCG/ML IJ SOLN
1000.0000 ug | Freq: Once | INTRAMUSCULAR | Status: AC
  Administered 2022-07-15: 1000 ug via INTRAMUSCULAR

## 2022-07-15 MED ORDER — SHINGRIX 50 MCG/0.5ML IM SUSR
INTRAMUSCULAR | 0 refills | Status: DC
  Filled 2022-07-15: qty 1, 1d supply, fill #0

## 2022-07-15 MED FILL — Pantoprazole Sodium EC Tab 40 MG (Base Equiv): ORAL | 90 days supply | Qty: 90 | Fill #0 | Status: AC

## 2022-07-15 NOTE — Progress Notes (Signed)
Sheryl [name] is a 76 y.o. female presents to the office today for Monthly B12 injection, per physician's orders. Original order: [name], NP- Lab will be checked today.  Cyanocobalamin 1000 mg/ml administered today in L deltoid. Patient tolerated injection. Patient due for follow up labs/provider appt: No.  Patient next injection due: 1 month, appt made Yes  [place], Kristine Garbe L

## 2022-07-21 ENCOUNTER — Other Ambulatory Visit: Payer: Self-pay | Admitting: Family

## 2022-07-21 ENCOUNTER — Encounter (HOSPITAL_BASED_OUTPATIENT_CLINIC_OR_DEPARTMENT_OTHER): Payer: Self-pay

## 2022-07-21 MED ORDER — AMLODIPINE BESYLATE 5 MG PO TABS: 5.0000 mg | ORAL_TABLET | Freq: Every day | ORAL | 0 refills | Status: DC

## 2022-07-22 ENCOUNTER — Other Ambulatory Visit: Payer: Self-pay

## 2022-07-22 ENCOUNTER — Telehealth: Payer: Self-pay | Admitting: Family

## 2022-07-22 MED ORDER — TRELEGY ELLIPTA 200-62.5-25 MCG/ACT IN AEPB: 1.0000 | INHALATION_SPRAY | Freq: Every day | RESPIRATORY_TRACT | 3 refills | Status: DC

## 2022-07-22 NOTE — Telephone Encounter (Signed)
Pt called stating that she needs a paper copy of her trellegy Rx to send to the company that manufactures it. Pt is looking to get assistance from them to fill it and needs an Rx to get it approved and filled. Pt would like a call when this is available for pickup.

## 2022-07-22 NOTE — Telephone Encounter (Signed)
Prescription printed out for patient, lvm for patient to pick up

## 2022-08-08 ENCOUNTER — Ambulatory Visit (INDEPENDENT_AMBULATORY_CARE_PROVIDER_SITE_OTHER): Payer: Medicare Other | Admitting: *Deleted

## 2022-08-08 DIAGNOSIS — Z0000 Encounter for general adult medical examination without abnormal findings: Secondary | ICD-10-CM | POA: Diagnosis not present

## 2022-08-08 NOTE — Patient Instructions (Signed)
Sheryl [name] , Thank you for taking time to come for your Medicare Wellness Visit. I appreciate your ongoing commitment to your health goals. Please review the following plan we discussed and let me know if I can assist you in the future.   These are the goals we discussed:  Goals      Patient Stated     Continue drinking water, continue  current activity level & healthy eating        This is a list of the screening recommended for you and due dates:  Health Maintenance  Topic Date Due   Eye exam for diabetics  12/23/2016   Hemoglobin A1C  07/16/2022   COVID-19 Vaccine (6 - 2023-24 season) 09/09/2022   Yearly kidney function blood test for diabetes  01/14/2023   Yearly kidney health urinalysis for diabetes  01/14/2023   Complete foot exam   01/14/2023   DTaP/Tdap/Td vaccine (3 - Td or Tdap) 02/15/2023   Medicare Annual Wellness Visit  08/09/2023   Colon Cancer Screening  01/01/2025   Pneumonia Vaccine  Completed   Flu Shot  Completed   DEXA scan (bone density measurement)  Completed   Hepatitis C Screening: USPSTF Recommendation to screen - Ages 33-79 yo.  Completed   Zoster (Shingles) Vaccine  Completed   HPV Vaccine  Aged Out     Next appointment: Follow up in one year for your annual wellness visit    Preventive Care 65 Years and Older, Female Preventive care refers to lifestyle choices and visits with your health care provider that can promote health and wellness. What does preventive care include? A yearly physical exam. This is also called an annual well check. Dental exams once or twice a year. Routine eye exams. Ask your health care provider how often you should have your eyes checked. Personal lifestyle choices, including: Daily care of your teeth and gums. Regular physical activity. Eating a healthy diet. Avoiding tobacco and drug use. Limiting alcohol use. Practicing safe sex. Taking low-dose aspirin every day. Taking vitamin and mineral supplements as  recommended by your health care provider. What happens during an annual well check? The services and screenings done by your health care provider during your annual well check will depend on your age, overall health, lifestyle risk factors, and family history of disease. Counseling  Your health care provider may ask you questions about your: Alcohol use. Tobacco use. Drug use. Emotional well-being. Home and relationship well-being. Sexual activity. Eating habits. History of falls. Memory and ability to understand (cognition). Work and work Statistician. Reproductive health. Screening  You may have the following tests or measurements: Height, weight, and BMI. Blood pressure. Lipid and cholesterol levels. These may be checked every 5 years, or more frequently if you are over 37 years old. Skin check. Lung cancer screening. You may have this screening every year starting at age 38 if you have a 30-pack-year history of smoking and currently smoke or have quit within the past 15 years. Fecal occult blood test (FOBT) of the stool. You may have this test every year starting at age 70. Flexible sigmoidoscopy or colonoscopy. You may have a sigmoidoscopy every 5 years or a colonoscopy every 10 years starting at age 60. Hepatitis C blood test. Hepatitis B blood test. Sexually transmitted disease (STD) testing. Diabetes screening. This is done by checking your blood sugar (glucose) after you have not eaten for a while (fasting). You may have this done every 1-3 years. Bone density scan. This is  done to screen for osteoporosis. You may have this done starting at age 48. Mammogram. This may be done every 1-2 years. Talk to your health care provider about how often you should have regular mammograms. Talk with your health care provider about your test results, treatment options, and if necessary, the need for more tests. Vaccines  Your health care provider may recommend certain vaccines, such  as: Influenza vaccine. This is recommended every year. Tetanus, diphtheria, and acellular pertussis (Tdap, Td) vaccine. You may need a Td booster every 10 years. Zoster vaccine. You may need this after age 100. Pneumococcal 13-valent conjugate (PCV13) vaccine. One dose is recommended after age 25. Pneumococcal polysaccharide (PPSV23) vaccine. One dose is recommended after age 3. Talk to your health care provider about which screenings and vaccines you need and how often you need them. This information is not intended to replace advice given to you by your health care provider. Make sure you discuss any questions you have with your health care provider. Document Released: 09/07/2015 Document Revised: 04/30/2016 Document Reviewed: 06/12/2015 Elsevier Interactive Patient Education  2017 [place] Prevention in the Home Falls can cause injuries. They can happen to people of all ages. There are many things you can do to make your home safe and to help prevent falls. What can I do on the outside of my home? Regularly fix the edges of walkways and driveways and fix any cracks. Remove anything that might make you trip as you walk through a door, such as a raised step or threshold. Trim any bushes or trees on the path to your home. Use bright outdoor lighting. Clear any walking paths of anything that might make someone trip, such as rocks or tools. Regularly check to see if handrails are loose or broken. Make sure that both sides of any steps have handrails. Any raised decks and porches should have guardrails on the edges. Have any leaves, snow, or ice cleared regularly. Use sand or salt on walking paths during winter. Clean up any spills in your garage right away. This includes oil or grease spills. What can I do in the bathroom? Use night lights. Install grab bars by the toilet and in the tub and shower. Do not use towel bars as grab bars. Use non-skid mats or decals in the tub or  shower. If you need to sit down in the shower, use a plastic, non-slip stool. Keep the floor dry. Clean up any water that spills on the floor as soon as it happens. Remove soap buildup in the tub or shower regularly. Attach bath mats securely with double-sided non-slip rug tape. Do not have throw rugs and other things on the floor that can make you trip. What can I do in the bedroom? Use night lights. Make sure that you have a light by your bed that is easy to reach. Do not use any sheets or blankets that are too big for your bed. They should not hang down onto the floor. Have a firm chair that has side arms. You can use this for support while you get dressed. Do not have throw rugs and other things on the floor that can make you trip. What can I do in the kitchen? Clean up any spills right away. Avoid walking on wet floors. Keep items that you use a lot in easy-to-reach places. If you need to reach something above you, use a strong step stool that has a grab bar. Keep electrical cords out of  the way. Do not use floor polish or wax that makes floors slippery. If you must use wax, use non-skid floor wax. Do not have throw rugs and other things on the floor that can make you trip. What can I do with my stairs? Do not leave any items on the stairs. Make sure that there are handrails on both sides of the stairs and use them. Fix handrails that are broken or loose. Make sure that handrails are as long as the stairways. Check any carpeting to make sure that it is firmly attached to the stairs. Fix any carpet that is loose or worn. Avoid having throw rugs at the top or bottom of the stairs. If you do have throw rugs, attach them to the floor with carpet tape. Make sure that you have a light switch at the top of the stairs and the bottom of the stairs. If you do not have them, ask someone to add them for you. What else can I do to help prevent falls? Wear shoes that: Do not have high heels. Have  rubber bottoms. Are comfortable and fit you well. Are closed at the toe. Do not wear sandals. If you use a stepladder: Make sure that it is fully opened. Do not climb a closed stepladder. Make sure that both sides of the stepladder are locked into place. Ask someone to hold it for you, if possible. Clearly mark and make sure that you can see: Any grab bars or handrails. First and last steps. Where the edge of each step is. Use tools that help you move around (mobility aids) if they are needed. These include: Canes. Walkers. Scooters. Crutches. Turn on the lights when you go into a dark area. Replace any light bulbs as soon as they burn out. Set up your furniture so you have a clear path. Avoid moving your furniture around. If any of your floors are uneven, fix them. If there are any pets around you, be aware of where they are. Review your medicines with your doctor. Some medicines can make you feel dizzy. This can increase your chance of falling. Ask your doctor what other things that you can do to help prevent falls. This information is not intended to replace advice given to you by your health care provider. Make sure you discuss any questions you have with your health care provider. Document Released: 06/07/2009 Document Revised: 01/17/2016 Document Reviewed: 09/15/2014 Elsevier Interactive Patient Education  2017 [employer].

## 2022-08-08 NOTE — Progress Notes (Signed)
Subjective:   Sheryl [name] is a 76 y.o. female who presents for Medicare Annual (Subsequent) preventive examination.  I connected with  Kathee Polite on 08/08/22 by a audio enabled telemedicine application and verified that I am speaking with the correct person using two identifiers.  Patient Location: Home  Provider Location: Office/Clinic  I discussed the limitations of evaluation and management by telemedicine. The patient expressed understanding and agreed to proceed.   Review of Systems    Defer to PCP Cardiac Risk Factors include: advanced age (>72mn, >>30women);diabetes mellitus;dyslipidemia;hypertension     Objective:    There were no vitals filed for this visit. There is no height or weight on file to calculate BMI.     08/08/2022   10:33 AM 08/06/2021   10:28 AM 06/03/2021    4:20 PM 08/14/2020   12:39 PM 07/26/2020   11:20 AM 05/27/2019    1:59 PM 05/20/2018   10:24 AM  Advanced Directives  Does Patient Have a Medical Advance Directive? _0  Yes Yes  Type of AParamedicof ATwiningLiving will Living will;Healthcare Power of Attorney Living will  [place] will [place] SpringsLiving will HH[place]Living will  Does patient want to make changes to medical advance directive? No - Patient declined  No - Patient declined   No - Patient declined   Copy of HLinganorein Chart? No - copy requested No - copy requested   No - copy requested No - copy requested No - copy requested    Current Medications (verified) Outpatient Encounter Medications as of 08/08/2022  Medication Sig   albuterol (VENTOLIN HFA) 108 (90 Base) MCG/ACT inhaler Inhale 2 puffs into the lungs every 6 (six) hours as needed for wheezing or shortness of breath.   amLODipine (NORVASC) 5 MG tablet Take 1 tablet (5 mg total) by mouth daily.   aspirin 81 MG tablet Take 81 mg by  mouth daily.   clindamycin (CLEOCIN) 150 MG capsule Take 2 capsules by mouth 1 hour prior to surgery then take 1 capsule 4 times a day until gone   clobetasol (TEMOVATE) 0.05 % external solution Apply topically 2 times a day.   clobetasol cream (TEMOVATE) 0.05 % Apply to affected areas 2 times a day for 2 weeks on, and 1 week off. Repeat cycle as needed.   COVID-19 mRNA vaccine 2023-2024 (COMIRNATY) syringe Inject into the muscle.   Fluticasone-Umeclidin-Vilant (TRELEGY ELLIPTA) 200-62.5-25 MCG/ACT AEPB Inhale 1 puff into the lungs daily.   ibuprofen (ADVIL) 600 MG tablet Take 1 tablet (600 mg total) by mouth every 6-8 hours as needed for pain and swelling.   levothyroxine (SYNTHROID) 100 MCG tablet TAKE 1 TABLET DAILY   montelukast (SINGULAIR) 10 MG tablet TAKE 1 TABLET AT BEDTIME   multivitamin (THERAGRAN) per tablet Take 1 tablet by mouth daily.   NON FORMULARY TUMERIC MILK.   NON FORMULARY CBD. Derivative of marijuana without the marijuana.   pantoprazole (PROTONIX) 40 MG tablet Take 1 tablet (40 mg total) by mouth daily.   Probiotic Product (PROBIOTIC DAILY PO) Take 1 capsule by mouth daily.   VOLTAREN 1 % GEL APPLY 2 GRAMS TOPICALLY 4 TIMES AS NEEDED   Zoster Vaccine Adjuvanted ([facility] injection Inject into the muscle.   [DISCONTINUED] lisinopril (ZESTRIL) 10 MG tablet Take 1 tablet (10 mg total) by mouth daily.   No facility-administered encounter medications on file as of 08/08/2022.  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   [place]   HERNIA REPAIR  10/23/2016   KNEE SURGERY  2006   arthroscopic left knee   KNEE SURGERY  2001   right knee   Family History  Problem Relation Age of Onset   Heart disease Other        CAD   Diabetes Other    Hyperlipidemia Other    Stroke Other    Social History   Socioeconomic History   Marital status: Single    Spouse name: Not on file   Number of children: 1   Years of education: Not on file   Highest education level: Not on file  Occupational History   Occupation: unemployed    Employer: DISABLED  Tobacco Use   Smoking status: Former    Types: Cigarettes    Quit date: 04/25/2021    Years since quitting: 1.2   Smokeless tobacco: Never   Tobacco comments:    smokes occasionally but not everyday  Substance and Sexual Activity   Alcohol use: Yes    Alcohol/week: 1.0 standard drink of alcohol    Types: 1 Glasses of wine per week    Comment: occasionally   Drug use: Yes    Comment: Smokes "medical grade" marijuana 2 puffs 3 x weekly for rheumatoid arthritis.per pt   Sexual activity: Never  Other Topics Concern   Not on file  Social History Narrative   Regular exercise:  Stretching exercises. Resistance bands   Caffeine: 1 mug (2cus) daily.      Social Determinants of Health   Financial Resource Strain: Low Risk  (08/06/2021)   Overall Financial Resource Strain  (CARDIA)    Difficulty of Paying Living Expenses: Not hard at all  Food Insecurity: No Food Insecurity (08/08/2022)   Hunger Vital Sign    Worried About Running Out of Food in the Last Year: Never true    Ran Out of Food in the Last Year: Never true  Transportation Needs: No Transportation Needs (08/08/2022)   PRAPARE - Hydrologist (Medical): No    Lack of Transportation (Non-Medical): No  Physical Activity: Sufficiently Active (08/06/2021)   Exercise Vital Sign    Days of Exercise per Week: 7 days    Minutes of Exercise per Session: 30 min  Stress: No Stress Concern Present (08/06/2021)   [place] -  Occupational Stress Questionnaire    Feeling of Stress : Not at all  Social Connections: Moderately Isolated (07/26/2020)   Social Connection and Isolation Panel [NHANES]    Frequency of Communication with Friends and Family: More than three times a week    Frequency of Social Gatherings with Friends and Family: Once a week    Attends Religious Services: Never    Marine scientist or Organizations: Yes    Attends Music therapist: 1 to 4 times per year    Marital Status: Divorced    Tobacco Counseling Counseling given: Not Answered Tobacco comments: smokes occasionally but not everyday   Clinical Intake:  Pre-visit preparation completed: Yes  Pain : No/denies pain  How often do you need to have someone help you when you read instructions, pamphlets, or other written materials from your doctor or pharmacy?: 1 - Never  Diabetic? Nutrition Risk Assessment:  Has the patient had any N/V/D within the last 2 months?  No  Does the patient have any non-healing wounds?  No  Has the patient had any unintentional weight loss or weight gain?  No   Diabetes:  Is the patient diabetic?  Yes  If diabetic, was a CBG obtained today?  No  Did the patient bring in their glucometer from home?  No  How often do you  monitor your CBG's? Never.   Financial Strains and Diabetes Management:  Are you having any financial strains with the device, your supplies or your medication? No .  Does the patient want to be seen by Chronic Care Management for management of their diabetes?  No  Would the patient like to be referred to a Nutritionist or for Diabetic Management?  No   Diabetic Exams:  Diabetic Eye Exam: Overdue for diabetic eye exam. Pt has been advised about the importance in completing this exam. Patient advised to call and schedule an eye exam. Diabetic Foot Exam: Completed 01/13/22   Interpreter Needed?: No  Information entered by :: Beatris Ship, [place]   Activities of Daily Living    08/08/2022   10:41 AM  In your present state of health, do you have any difficulty performing the following activities:  Hearing? 0  Vision? 1  Comment has noticed a slight change in vision  Difficulty concentrating or making decisions? 0  Walking or climbing stairs? 1  Dressing or bathing? 0  Doing errands, shopping? 0  Preparing Food and eating ? N  Using the Toilet? N  In the past six months, have you accidently leaked urine? Y  Comment some stress incontinence  Do you have problems with loss of bowel control? N  Managing your Medications? N  Managing your Finances? N  Housekeeping or managing your Housekeeping? N    Patient Care Team: [name], NP as PCP - General (Internal Medicine) Kerin Salen, DDS as Consulting Physician (Dental General Practice) Gregory as Consulting Physician (Ophthalmology) Lake Bells., MD as Consulting Physician (Gastroenterology) Brooks Sailors, MD as Consulting Physician (Surgery) Zebedee Iba., MD as Consulting Physician (Hematology and Oncology) [name], NP as Nurse Practitioner (Internal Medicine)  Indicate any recent Medical Services you may have received from other than [facility] providers in the past year (date may be  approximate).     Assessment:   This is a routine wellness examination for Satrina.  Hearing/Vision screen No results found.  Dietary issues and exercise activities discussed: Current Exercise Habits: Home exercise routine, Type of exercise: strength  training/weights, Time (Minutes): 25, Frequency (Times/Week): 2, Weekly Exercise (Minutes/Week): 50, Intensity: Mild, Exercise limited by: respiratory conditions(s)   Goals Addressed   None    Depression Screen    08/08/2022   10:37 AM 08/06/2021   10:46 AM 07/26/2020   11:28 AM 05/27/2019    2:01 PM 05/20/2018   10:29 AM 05/19/2017   10:40 AM 03/03/2016   10:17 AM  PHQ 2/9 Scores  PHQ - 2 Score 0 0 0 0 0 0 0    Fall Risk    08/08/2022   10:39 AM 08/06/2021   10:39 AM 07/26/2020   11:25 AM 05/27/2019    2:01 PM 05/20/2018   10:29 AM  Fall Risk   Falls in the past year? 0 0 0 0 No  Number falls in past yr: 0 0 0    Injury with Fall? 0 0 0    Risk for fall due to : No Fall Risks      Follow up Falls evaluation completed Falls prevention discussed Falls prevention discussed      FALL RISK PREVENTION PERTAINING TO THE HOME:  Any stairs in or around the home? No  If so, are there any without handrails? No  Home free of loose throw rugs in walkways, pet beds, electrical cords, etc? Yes  Adequate lighting in your home to reduce risk of falls? Yes   ASSISTIVE DEVICES UTILIZED TO PREVENT FALLS:  Life alert? No  Use of a cane, walker or w/c? No  Grab bars in the bathroom? No  Shower chair or bench in shower? No  Elevated toilet seat or a handicapped toilet? Yes   TIMED UP AND GO:  Was the test performed?  No, audio visit .    Cognitive Function:    08/08/2022   10:54 AM  MMSE - Mini Mental State Exam  Not completed: Unable to complete        Immunizations Immunization History  Administered Date(s) Administered   COVID-19, mRNA, vaccine(Comirnaty)12 years and older 07/15/2022   Fluad Quad(high Dose 65+)  04/29/2019, 05/11/2020, 05/17/2021, 05/16/2022   Influenza Split 07/07/2011   Influenza Whole 06/05/2009, 04/24/2010   Influenza, High Dose Seasonal PF 06/29/2015, 04/30/2016, 05/19/2017, 05/24/2018   Influenza,inj,Quad PF,6+ Mos 06/01/2013, 05/25/2014   Moderna Covid-19 Vaccine Bivalent Booster 69yr & up 01/13/2022   Moderna SARS-COV2 Booster Vaccination 02/12/2021   Moderna Sars-Covid-2 Vaccination 11/02/2019, 11/30/2019, 08/02/2020   Pneumococcal Conjugate-13 06/01/2013   Pneumococcal Polysaccharide-23 10/01/2010, 09/12/2016   Td 08/25/2002   Tdap 02/14/2013   Zoster Recombinat (Shingrix) 02/07/2022, 07/15/2022    TDAP status: Up to date  Flu Vaccine status: Up to date  Pneumococcal vaccine status: Up to date  Covid-19 vaccine status: Information provided on how to obtain vaccines.   Qualifies for Shingles Vaccine? Yes   Zostavax completed No   Shingrix Completed?: Yes  Screening Tests Health Maintenance  Topic Date Due   OPHTHALMOLOGY EXAM  12/23/2016   Medicare Annual Wellness (AWV)  08/06/2022   HEMOGLOBIN A1C  07/16/2022   COVID-19 Vaccine (6 - 2023-24 season) 09/09/2022   Diabetic kidney evaluation - eGFR measurement  01/14/2023   Diabetic kidney evaluation - Urine ACR  01/14/2023   FOOT EXAM  01/14/2023   DTaP/Tdap/Td (3 - Td or Tdap) 02/15/2023   COLONOSCOPY (Pts 45-472yrInsurance coverage will need to be confirmed)  01/01/2025   Pneumonia Vaccine 6579Years old  Completed   INFLUENZA VACCINE  Completed   DEXA SCAN  Completed   Hepatitis  C Screening  Completed   Zoster Vaccines- Shingrix  Completed   HPV VACCINES  Aged Out    Health Maintenance  Health Maintenance Due  Topic Date Due   OPHTHALMOLOGY EXAM  12/23/2016   Medicare Annual Wellness (AWV)  08/06/2022   HEMOGLOBIN A1C  07/16/2022    Colorectal cancer screening: Type of screening: Colonoscopy. Completed 01/02/15. Repeat every 10 years  Mammogram status: Completed 06/04/21. Repeat every  year  Bone Density status: Completed 02/04/21. Results reflect: Bone density results: NORMAL. Repeat every 2 years.  Lung Cancer Screening: (Low Dose CT Chest recommended if Age 48-80 years, 30 pack-year currently smoking OR have quit w/in 15years.) does not qualify.   Additional Screening:  Hepatitis C Screening: does qualify; Completed 03/07/16  Vision Screening: Recommended annual ophthalmology exams for early detection of glaucoma and other disorders of the eye. Is the patient up to date with their annual eye exam?  Yes  Who is the provider or what is the name of the office in which the patient attends annual eye exams? [facility]. If pt is not established with a provider, would they like to be referred to a provider to establish care? No .   Dental Screening: Recommended annual dental exams for proper oral hygiene  Community Resource Referral / Chronic Care Management: CRR required this visit?  No   CCM required this visit?  No      Plan:     I have personally reviewed and noted the following in the patient's chart:   Medical and social history Use of alcohol, tobacco or illicit drugs  Current medications and supplements including opioid prescriptions. Patient is not currently taking opioid prescriptions. Functional ability and status Nutritional status Physical activity Advanced directives List of other physicians Hospitalizations, surgeries, and ER visits in previous 12 months Vitals Screenings to include cognitive, depression, and falls Referrals and appointments  In addition, I have reviewed and discussed with patient certain preventive protocols, quality metrics, and best practice recommendations. A written personalized care plan for preventive services as well as general preventive health recommendations were provided to patient.   Due to this being a telephonic visit, the after visit summary with patients personalized plan was offered to patient via mail or  my-chart. Patient would like to access on my-chart.  Beatris Ship, [place]   08/08/2022   Nurse Notes: None

## 2022-08-14 ENCOUNTER — Ambulatory Visit (INDEPENDENT_AMBULATORY_CARE_PROVIDER_SITE_OTHER): Payer: Medicare Other

## 2022-08-14 DIAGNOSIS — E538 Deficiency of other specified B group vitamins: Secondary | ICD-10-CM

## 2022-08-14 MED ORDER — CYANOCOBALAMIN 1000 MCG/ML IJ SOLN
1000.0000 ug | Freq: Once | INTRAMUSCULAR | Status: AC
  Administered 2022-08-14: 1000 ug via INTRAMUSCULAR

## 2022-08-14 NOTE — Progress Notes (Signed)
Sheryl [name] is a 76 y.o. female presents to the office today for monthly B12 injection,, per physician's orders. Original order: 07/15/22 "B12 and folate levels are good. Please continue your b12 injections. " Cyanocobalamin 1000 mg/ml administered today in L  deltoid. Patient tolerated injection. Patient due for follow up labs/provider appt: No.  Patient next injection due: 1 month, appt made Yes  [place], Kristine Garbe L

## 2022-08-28 ENCOUNTER — Other Ambulatory Visit (HOSPITAL_BASED_OUTPATIENT_CLINIC_OR_DEPARTMENT_OTHER): Payer: Self-pay

## 2022-08-28 MED ORDER — CLINDAMYCIN HCL 150 MG PO CAPS
ORAL_CAPSULE | ORAL | 0 refills | Status: DC
  Filled 2022-08-28: qty 19, 5d supply, fill #0

## 2022-08-29 ENCOUNTER — Other Ambulatory Visit (HOSPITAL_BASED_OUTPATIENT_CLINIC_OR_DEPARTMENT_OTHER): Payer: Self-pay

## 2022-09-01 ENCOUNTER — Telehealth: Payer: Self-pay | Admitting: Family

## 2022-09-01 ENCOUNTER — Other Ambulatory Visit: Payer: Self-pay

## 2022-09-01 ENCOUNTER — Other Ambulatory Visit (HOSPITAL_BASED_OUTPATIENT_CLINIC_OR_DEPARTMENT_OTHER): Payer: Self-pay

## 2022-09-01 MED ORDER — PANTOPRAZOLE SODIUM 40 MG PO TBEC
40.0000 mg | DELAYED_RELEASE_TABLET | Freq: Every day | ORAL | 3 refills | Status: DC
  Filled 2022-09-01 – 2022-09-11 (×2): qty 90, 90d supply, fill #0
  Filled 2022-12-16: qty 90, 90d supply, fill #1
  Filled 2023-04-16: qty 90, 90d supply, fill #2
  Filled 2023-07-06: qty 90, 90d supply, fill #3

## 2022-09-01 NOTE — Telephone Encounter (Signed)
Rx sent to [facility]

## 2022-09-01 NOTE — Telephone Encounter (Signed)
Prescription Request  09/01/2022  Is this a "Controlled Substance" medicine? No  LOV: 05/09/2022  What is the name of the medication or equipment?   Rx #: [id number]  pantoprazole (PROTONIX) 40 MG tablet [[id number]]   Have you contacted your pharmacy to request a refill? No   Which pharmacy would you like this sent to?   [facility] [address], [place] [address] Phone: [phone] Fax: [phone]   Patient notified that their request is being sent to the clinical staff for review and that they should receive a response within 2 business days.   Please advise at Mobile [phone] (mobile)

## 2022-09-09 ENCOUNTER — Other Ambulatory Visit (HOSPITAL_BASED_OUTPATIENT_CLINIC_OR_DEPARTMENT_OTHER): Payer: Self-pay

## 2022-09-11 ENCOUNTER — Other Ambulatory Visit (HOSPITAL_BASED_OUTPATIENT_CLINIC_OR_DEPARTMENT_OTHER): Payer: Self-pay

## 2022-09-12 ENCOUNTER — Other Ambulatory Visit (HOSPITAL_BASED_OUTPATIENT_CLINIC_OR_DEPARTMENT_OTHER): Payer: Self-pay

## 2022-09-16 ENCOUNTER — Telehealth: Payer: Self-pay | Admitting: Family

## 2022-09-16 ENCOUNTER — Ambulatory Visit (INDEPENDENT_AMBULATORY_CARE_PROVIDER_SITE_OTHER): Payer: Medicare Other | Admitting: *Deleted

## 2022-09-16 DIAGNOSIS — E538 Deficiency of other specified B group vitamins: Secondary | ICD-10-CM

## 2022-09-16 MED ORDER — CYANOCOBALAMIN 1000 MCG/ML IJ SOLN
1000.0000 ug | Freq: Once | INTRAMUSCULAR | Status: AC
  Administered 2022-09-16: 1000 ug via INTRAMUSCULAR

## 2022-09-16 NOTE — Telephone Encounter (Signed)
Patient came in for NV & stated she wanted to make sure all of her medications went to [facility].  [facility]  She would also like all refills that are due.... She did not list all medications

## 2022-09-16 NOTE — Progress Notes (Signed)
Patient here for monthly b12 injection per physicians order.  Injection given in left deltoid and patient tolerated well.  

## 2022-09-19 ENCOUNTER — Other Ambulatory Visit: Payer: Self-pay

## 2022-09-19 ENCOUNTER — Other Ambulatory Visit (HOSPITAL_BASED_OUTPATIENT_CLINIC_OR_DEPARTMENT_OTHER): Payer: Self-pay

## 2022-09-19 MED ORDER — MONTELUKAST SODIUM 10 MG PO TABS
10.0000 mg | ORAL_TABLET | Freq: Every day | ORAL | 1 refills | Status: DC
  Filled 2022-09-19: qty 90, 90d supply, fill #0
  Filled 2022-12-16: qty 90, 90d supply, fill #1

## 2022-09-19 MED ORDER — TRELEGY ELLIPTA 200-62.5-25 MCG/ACT IN AEPB
1.0000 | INHALATION_SPRAY | Freq: Every day | RESPIRATORY_TRACT | 3 refills | Status: DC
  Filled 2022-09-22: qty 60, 30d supply, fill #0
  Filled 2022-11-03: qty 60, 30d supply, fill #1
  Filled 2022-12-02: qty 60, 30d supply, fill #2
  Filled 2022-12-16 – 2022-12-26 (×2): qty 60, 30d supply, fill #3

## 2022-09-19 MED ORDER — AMLODIPINE BESYLATE 5 MG PO TABS
5.0000 mg | ORAL_TABLET | Freq: Every day | ORAL | 0 refills | Status: DC
  Filled 2022-09-19: qty 90, 90d supply, fill #0

## 2022-09-19 NOTE — Telephone Encounter (Signed)
Prescriptions sent to [facility] as requested

## 2022-09-22 ENCOUNTER — Other Ambulatory Visit (HOSPITAL_BASED_OUTPATIENT_CLINIC_OR_DEPARTMENT_OTHER): Payer: Self-pay

## 2022-09-29 ENCOUNTER — Other Ambulatory Visit (HOSPITAL_BASED_OUTPATIENT_CLINIC_OR_DEPARTMENT_OTHER): Payer: Self-pay

## 2022-09-29 ENCOUNTER — Other Ambulatory Visit: Payer: Self-pay | Admitting: Family

## 2022-09-29 MED ORDER — LEVOTHYROXINE SODIUM 100 MCG PO TABS
100.0000 ug | ORAL_TABLET | Freq: Every day | ORAL | 1 refills | Status: DC
  Filled 2022-09-29: qty 90, 90d supply, fill #0
  Filled 2022-12-02 – 2022-12-19 (×5): qty 90, 90d supply, fill #1

## 2022-09-30 ENCOUNTER — Other Ambulatory Visit (HOSPITAL_BASED_OUTPATIENT_CLINIC_OR_DEPARTMENT_OTHER): Payer: Self-pay

## 2022-10-17 ENCOUNTER — Other Ambulatory Visit (HOSPITAL_BASED_OUTPATIENT_CLINIC_OR_DEPARTMENT_OTHER): Payer: Self-pay

## 2022-10-17 ENCOUNTER — Ambulatory Visit (HOSPITAL_BASED_OUTPATIENT_CLINIC_OR_DEPARTMENT_OTHER)
Admission: RE | Admit: 2022-10-17 | Discharge: 2022-10-17 | Disposition: A | Discharge status: Home / Self Care | Payer: Medicare Other | Source: Ambulatory Visit | Attending: Family | Admitting: Family

## 2022-10-17 ENCOUNTER — Ambulatory Visit (INDEPENDENT_AMBULATORY_CARE_PROVIDER_SITE_OTHER): Payer: Medicare Other | Admitting: Family

## 2022-10-17 VITALS — BP 159/81 | HR 96 | Temp 98.3°F | Resp 16 | Wt 185.0 lb

## 2022-10-17 DIAGNOSIS — F32A Depression, unspecified: Secondary | ICD-10-CM | POA: Diagnosis not present

## 2022-10-17 DIAGNOSIS — J309 Allergic rhinitis, unspecified: Secondary | ICD-10-CM | POA: Diagnosis not present

## 2022-10-17 DIAGNOSIS — E119 Type 2 diabetes mellitus without complications: Secondary | ICD-10-CM

## 2022-10-17 DIAGNOSIS — M25471 Effusion, right ankle: Secondary | ICD-10-CM | POA: Insufficient documentation

## 2022-10-17 DIAGNOSIS — I10 Essential (primary) hypertension: Secondary | ICD-10-CM

## 2022-10-17 DIAGNOSIS — E039 Hypothyroidism, unspecified: Secondary | ICD-10-CM

## 2022-10-17 DIAGNOSIS — E785 Hyperlipidemia, unspecified: Secondary | ICD-10-CM

## 2022-10-17 DIAGNOSIS — I77819 Aortic ectasia, unspecified site: Secondary | ICD-10-CM

## 2022-10-17 DIAGNOSIS — J4520 Mild intermittent asthma, uncomplicated: Secondary | ICD-10-CM

## 2022-10-17 DIAGNOSIS — E782 Mixed hyperlipidemia: Secondary | ICD-10-CM

## 2022-10-17 DIAGNOSIS — M7989 Other specified soft tissue disorders: Secondary | ICD-10-CM | POA: Diagnosis not present

## 2022-10-17 DIAGNOSIS — E538 Deficiency of other specified B group vitamins: Secondary | ICD-10-CM | POA: Diagnosis not present

## 2022-10-17 HISTORY — DX: Effusion, right ankle: M25.471

## 2022-10-17 LAB — LIPID PANEL
Cholesterol: 200 mg/dL (ref 0–200)
HDL: 31.9 mg/dL — ABNORMAL LOW (ref >39.00)
LDL Cholesterol: 134 mg/dL — ABNORMAL HIGH (ref 0–99)
NonHDL: 167.81
Total CHOL/HDL Ratio: 6
Triglycerides: 171 mg/dL — ABNORMAL HIGH (ref 0.0–149.0)
VLDL: 34.2 mg/dL (ref 0.0–40.0)

## 2022-10-17 LAB — COMPREHENSIVE METABOLIC PANEL
ALT: 39 U/L — ABNORMAL HIGH (ref 0–35)
AST: 42 U/L — ABNORMAL HIGH (ref 0–37)
Albumin: 3.7 g/dL (ref 3.5–5.2)
Alkaline Phosphatase: 115 U/L (ref 39–117)
BUN: 22 mg/dL (ref 6–23)
CO2: 28 mEq/L (ref 19–32)
Calcium: 9.3 mg/dL (ref 8.4–10.5)
Chloride: 99 mEq/L (ref 96–112)
Creatinine, Ser: 1.03 mg/dL (ref 0.40–1.20)
GFR: 53.25 mL/min — ABNORMAL LOW (ref >60.00)
Glucose, Bld: 98 mg/dL (ref 70–99)
Potassium: 4.8 mEq/L (ref 3.5–5.1)
Sodium: 136 mEq/L (ref 135–145)
Total Bilirubin: 0.6 mg/dL (ref 0.2–1.2)
Total Protein: 8.2 g/dL (ref 6.0–8.3)

## 2022-10-17 LAB — TSH: TSH: 2.91 u[IU]/mL (ref 0.35–5.50)

## 2022-10-17 LAB — HEMOGLOBIN A1C: Hgb A1c MFr Bld: 6.3 % (ref 4.6–6.5)

## 2022-10-17 MED ORDER — CLOBETASOL PROPIONATE 0.05 % EX CREA
TOPICAL_CREAM | CUTANEOUS | 0 refills | Status: DC
  Filled 2022-10-17 – 2022-11-03 (×2): qty 60, 30d supply, fill #0

## 2022-10-17 MED ORDER — CYANOCOBALAMIN 1000 MCG/ML IJ SOLN
1000.0000 ug | Freq: Once | INTRAMUSCULAR | Status: AC
  Administered 2022-10-17: 1000 ug via INTRAMUSCULAR

## 2022-10-17 NOTE — Assessment & Plan Note (Signed)
Clinically stable on synthroid. Obtain follow up TSH.

## 2022-10-17 NOTE — Assessment & Plan Note (Signed)
BP Readings from Last 3 Encounters:  10/17/22 (!) 159/81  05/09/22 130/67  01/13/22 138/75

## 2022-10-17 NOTE — Assessment & Plan Note (Signed)
?   Secondary to OA versus RA. She is also concerned about the possibility of DVT. [place] order has been placed to rule out DVT.

## 2022-10-17 NOTE — Assessment & Plan Note (Signed)
Follow up US 8/23 noted the following:   distal infrarenal abdominal aortic aneurysm measuring 2.8 cm Recommend follow-up every 5 years. Will be due 04/22/23.

## 2022-10-17 NOTE — Assessment & Plan Note (Signed)
Continue monthly b12 injections.

## 2022-10-17 NOTE — Assessment & Plan Note (Signed)
Reports mood is intermittently poor.  Trying to work through some stressors.

## 2022-10-17 NOTE — Assessment & Plan Note (Signed)
Fair control on Trelegy and albuterol.

## 2022-10-17 NOTE — Assessment & Plan Note (Addendum)
Wt Readings from Last 3 Encounters:  10/17/22 185 lb (83.9 kg)  05/09/22 182 lb (82.6 kg)  01/13/22 189 lb (85.7 kg)   Lab Results  Component Value Date   HGBA1C 6.2 01/13/2022   HGBA1C 6.4 05/17/2021   HGBA1C 6.2 08/10/2020   Lab Results  Component Value Date   MICROALBUR 1.2 01/13/2022   LDLCALC 116 (H) 04/27/2020   CREATININE 0.80 01/13/2022   Notes diet has not been as good due to the soft diet she was eating for dental issues. She is motivated to get back on track.

## 2022-10-17 NOTE — Assessment & Plan Note (Signed)
She is taking singulair, zyrtec, flonase.  Notes recent increase in nasal congestion.

## 2022-10-17 NOTE — Progress Notes (Signed)
Subjective:   By signing my name below, I, Sheryl [name], attest that this documentation has been prepared under the direction and in the presence of [name], NP 10/17/22   Patient ID: Sheryl [name], female    DOB: 1946/04/07, 77 y.o.   MRN: [id number]  Chief Complaint  Patient presents with   Joint Swelling    Complains of right ankle swelling   B12 deficiency    Due for B12 shot today   Hypertension    Here for follow up    HPI Patient is in today for a follow up.    LE Edema: She complains of bilateral swelling in her ankles and lower legs. She also endorses bilateral medial pain in her ankles. She notes that her right ankle bothers her more. She reports she has experienced this before with her arthritis flare ups but that this time it is not going away. The swelling and pain tend to temporarily improve when she rests her feet but when she stands again symptoms return. She feels she is more flat footed than she previously was.   Congestion/Allergies: She had been on anti-biotics for about 11 days. After stopping antibiotics she reports symptoms returned and worsened. She has been taking Singulair, Zyrtec, and Flonase to relieve symptoms. She constantly vacuums in order to avoid dust accumulation. She uses an air purifier and Vicks on her nose to prevent inhaling allergens.   Asthma: She is compliant with Trelegy daily and albuterol as needed.   Dental work: She went on a self diet for about 2 months in preparation for a tooth extraction.   Diet: She has not been eating vegetables. She had been struggling to eat certain foods due to her recent dental work.   Weight: She has been gaining struggled with her weight for a while.  Vision: She is UTD on her routine eye exams. She states her eyes have been bothering her lately.   Colonoscopy: She reports that she is due for a colonoscopy.   Mood: She reports that lately she has been more irritable and short  tempered. She notes that she usually is not a short tempered person.    B12 Injection: She is requesting a B12 injection today.  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   [place]   HERNIA REPAIR  10/23/2016   KNEE SURGERY  2006   arthroscopic left knee   KNEE SURGERY  2001   right knee    Family History  Problem Relation Age of Onset   Heart disease Other  CAD   Diabetes Other    Hyperlipidemia Other    Stroke Other     Social History   Socioeconomic History   Marital status: Single    Spouse name: Not on file   Number of children: 1   Years of education: Not on file   Highest education level: Not on file  Occupational History   Occupation: unemployed    Employer: DISABLED  Tobacco Use   Smoking status: Former    Types: Cigarettes    Quit date: 04/25/2021    Years since quitting: 1.4   Smokeless tobacco: Never   Tobacco comments:    smokes occasionally but not everyday  Substance and Sexual Activity   Alcohol use: Yes    Alcohol/week: 1.0 standard drink of alcohol    Types: 1 Glasses of wine per week    Comment: occasionally   Drug use: Yes    Comment:  Smokes "medical grade" marijuana 2 puffs 3 x weekly for rheumatoid arthritis.per pt   Sexual activity: Never  Other Topics Concern   Not on file  Social History Narrative   Regular exercise:  Stretching exercises. Resistance bands   Caffeine: 1 mug (2cus) daily.      Social Determinants of Health   Financial Resource Strain: Low Risk  (08/06/2021)   Overall Financial Resource Strain (CARDIA)    Difficulty of Paying Living Expenses: Not hard at all  Food Insecurity: No Food Insecurity (08/08/2022)   Hunger Vital Sign    Worried About Running Out of Food in the Last Year: Never true    Ran Out of Food in the Last Year: Never true  Transportation Needs: No Transportation Needs (08/08/2022)   PRAPARE - Hydrologist (Medical): No    Lack of Transportation (Non-Medical): No  Physical Activity: Sufficiently Active (08/06/2021)   Exercise Vital Sign    Days of Exercise per Week: 7 days    Minutes of Exercise per Session: 30 min  Stress: No Stress Concern Present (08/06/2021)   [place]    Feeling of Stress : Not at all  Social Connections: Moderately Isolated (07/26/2020)   Social Connection and Isolation Panel [NHANES]    Frequency of Communication with Friends and Family: More than three times a week    Frequency of Social Gatherings with Friends and Family: Once a week    Attends Religious Services: Never    Marine scientist or Organizations: Yes    Attends Archivist Meetings: 1 to 4 times per year    Marital Status: Divorced  Human resources officer Violence: Not At Risk (08/08/2022)   Humiliation, Afraid, Rape, and Kick questionnaire    Fear of Current or Ex-Partner: No    Emotionally Abused: No    Physically Abused: No    Sexually Abused: No    Outpatient Medications Prior to Visit  Medication Sig Dispense Refill   albuterol (VENTOLIN HFA) 108 (90 Base) MCG/ACT inhaler  Inhale 2 puffs into the lungs every 6 (six) hours as needed for wheezing or shortness of breath. 54 g 1   amLODipine (NORVASC) 5 MG tablet Take 1 tablet (5 mg total) by mouth daily. 90 tablet 0   aspirin 81 MG tablet Take 81 mg by mouth daily.     clindamycin (CLEOCIN) 150 MG capsule Take two capsules by mouth 1 hour prior to surgery then take 1 capsule by mouth four times daily until gone.  19 capsule 0   Fluticasone-Umeclidin-Vilant (TRELEGY ELLIPTA) 200-62.5-25 MCG/ACT AEPB Inhale 1 puff into the lungs daily. 60 each 3   ibuprofen (ADVIL) 600 MG tablet Take 1 tablet (600 mg total) by mouth every 6-8 hours as needed for pain and swelling. 20 tablet 0   levothyroxine (SYNTHROID) 100 MCG tablet Take 1 tablet (100 mcg total) by mouth daily. 90 tablet 1   montelukast (SINGULAIR) 10 MG tablet Take 1 tablet (10 mg total) by mouth at bedtime. 90 tablet 1   multivitamin (THERAGRAN) per tablet Take 1 tablet by mouth daily.     NON FORMULARY TUMERIC MILK.     NON FORMULARY CBD. Derivative of marijuana without the marijuana.     pantoprazole (PROTONIX) 40 MG tablet Take 1 tablet (40 mg total) by mouth daily. 90 tablet 3   Probiotic Product (PROBIOTIC DAILY PO) Take 1 capsule by mouth daily.     VOLTAREN 1 % GEL APPLY 2 GRAMS TOPICALLY 4 TIMES AS NEEDED 100 g 2   clobetasol (TEMOVATE) 0.05 % external solution Apply topically 2 times a day. 50 mL 2   clobetasol cream (TEMOVATE) 0.05 % Apply to affected areas 2 times a day for 2 weeks on, and 1 week off. Repeat cycle as needed. 60 g 0   COVID-19 mRNA vaccine 2023-2024 (COMIRNATY) syringe Inject into the muscle. 0.3 mL 0   Zoster Vaccine Adjuvanted [facility]) injection Inject into the muscle. 1 each 0   No facility-administered medications prior to visit.    Allergies  Allergen Reactions   Levofloxacin Shortness Of Breath and Rash   Mold Extract [Trichophyton] Anaphylaxis   Ativan [Lorazepam]     Visual hallucinations.  Visual hallucinations.     Cefuroxime Axetil     REACTION: asthma, cough   Hydrocodone Bit-[place] Mbr Nausea Only   Lisinopril     Diarrhea hives   Rosuvastatin Calcium     Muscle pain, syncope   Statins     Muscle pain, syncope   Zetia [Ezetimibe] Diarrhea   Hydrocodone-Acetaminophen Nausea Only    Review of Systems  HENT:  Positive for congestion.   Eyes:        (+) Eye discomfort  Cardiovascular:  Positive for leg swelling (bilateral LE).  Musculoskeletal:  Positive for joint pain (bilatera medial ankle pain and edema) and myalgias.  Endo/Heme/Allergies:  Positive for environmental allergies.       Objective:    Physical Exam Constitutional:      General: She is awake. She is not in acute distress.    Appearance: Normal appearance. She is well-developed.  HENT:     Head: Normocephalic and atraumatic.     Right Ear: External ear normal.     Left Ear: External ear normal.  Eyes:     General: No scleral icterus. Neck:     Thyroid: No thyromegaly.  Cardiovascular:     Rate and Rhythm: Normal rate and regular rhythm.     Heart sounds: Normal heart sounds. No murmur heard. Pulmonary:     Effort: Pulmonary effort is normal. No respiratory distress.     Breath sounds: Normal breath sounds. No wheezing.  Musculoskeletal:        General: Swelling (right ankle swelling) present.     Cervical back: Neck supple.  Skin:    General: Skin is warm and dry.  Neurological:     Mental Status: She is alert and oriented to person, place, and time.     Cranial Nerves: No facial  asymmetry.  Psychiatric:        Mood and Affect: Mood normal.        Speech: Speech normal.        Behavior: Behavior normal.        Thought Content: Thought content normal.        Judgment: Judgment normal.     BP (!) 159/81 (BP Location: Left Arm, Patient Position: Sitting, Cuff Size: Large)   Pulse 96   Temp 98.3 F (36.8 C) (Oral)   Resp 16   Wt 185 lb (83.9 kg)   SpO2 98%   BMI 33.84 kg/m  Wt Readings from Last 3  Encounters:  10/17/22 185 lb (83.9 kg)  05/09/22 182 lb (82.6 kg)  01/13/22 189 lb (85.7 kg)       Assessment & Plan:  Controlled type 2 diabetes mellitus without complication, without long-term current use of insulin (HCC) Assessment & Plan: Wt Readings from Last 3 Encounters:  10/17/22 185 lb (83.9 kg)  05/09/22 182 lb (82.6 kg)  01/13/22 189 lb (85.7 kg)   Lab Results  Component Value Date   HGBA1C 6.2 01/13/2022   HGBA1C 6.4 05/17/2021   HGBA1C 6.2 08/10/2020   Lab Results  Component Value Date   MICROALBUR 1.2 01/13/2022   LDLCALC 116 (H) 04/27/2020   CREATININE 0.80 01/13/2022   Notes diet has not been as good due to the soft diet she was eating for dental issues. She is motivated to get back on track.   Orders: -     Hemoglobin A1c -     Comprehensive metabolic panel  Right ankle swelling Assessment & Plan: ? Secondary to OA versus RA. She is also concerned about the possibility of DVT. [place] order has been placed to rule out DVT.   Orders: -     US Venous Img Lower Unilateral Right (DVT); Future  Hypothyroidism, unspecified type Assessment & Plan: Clinically stable on synthroid. Obtain follow up TSH.   Orders: -     TSH  Primary hypertension Assessment & Plan: BP Readings from Last 3 Encounters:  10/17/22 (!) 159/81  05/09/22 130/67  01/13/22 138/75      Allergic rhinitis, unspecified seasonality, unspecified trigger Assessment & Plan: She is taking singulair, zyrtec, flonase.  Notes recent increase in nasal congestion.    Intermittent asthma without complication, unspecified asthma severity Assessment & Plan: Fair control on Trelegy and albuterol.    Depression, unspecified depression type Assessment & Plan: Reports mood is intermittently poor.  Trying to work through some stressors.     B12 deficiency Assessment & Plan: Continue monthly b12 injections.   Orders: -     Cyanocobalamin  Ectatic aorta ([place]) Assessment & Plan: Follow  up [place] 8/23 noted the following:   distal infrarenal abdominal aortic aneurysm measuring 2.8 cm Recommend follow-up every 5 years. Will be due 04/22/23.   Hyperlipidemia, unspecified hyperlipidemia type -     Lipid panel  Mixed dyslipidemia Assessment & Plan: >>ASSESSMENT AND PLAN FOR HYPERLIPIDEMIA WRITTEN ON 10/17/2022 12:04 PM BY [name], NP  Lab Results  Component Value Date   CHOL 173 04/27/2020   HDL 26 (L) 04/27/2020   LDLCALC 116 (H) 04/27/2020   LDLDIRECT 129.0 12/04/2017   TRIG 193 (H) 04/27/2020   CHOLHDL 6.7 (H) 04/27/2020   Intolerant to statins. Continue to work on diet/exercise and weight loss.   Other orders -     Clobetasol Propionate; Apply to affected areas 2 times a day for  2 weeks on, and 1 week off. Repeat cycle as needed.  Dispense: 60 g; Refill: 0     I,Rachel Rivera,acting as a scribe for [name], NP.,have documented all relevant documentation on the behalf of [name], NP,as directed by  [name], NP while in the presence of [name], NP.   I, [name], NP, personally preformed the services described in this documentation.  All medical record entries made by the scribe were at my direction and in my presence.  I have reviewed the chart and discharge instructions (if applicable) and agree that the record reflects my personal performance and is accurate and complete. 10/17/22   [name], NP

## 2022-10-17 NOTE — Assessment & Plan Note (Signed)
>>  ASSESSMENT AND PLAN FOR HYPERLIPIDEMIA WRITTEN ON 10/17/2022 12:04 PM BY [name], NP  Lab Results  Component Value Date   CHOL 173 04/27/2020   HDL 26 (L) 04/27/2020   LDLCALC 116 (H) 04/27/2020   LDLDIRECT 129.0 12/04/2017   TRIG 193 (H) 04/27/2020   CHOLHDL 6.7 (H) 04/27/2020   Intolerant to statins. Continue to work on diet/exercise and weight loss.

## 2022-10-17 NOTE — Assessment & Plan Note (Signed)
Lab Results  Component Value Date   CHOL 173 04/27/2020   HDL 26 (L) 04/27/2020   LDLCALC 116 (H) 04/27/2020   LDLDIRECT 129.0 12/04/2017   TRIG 193 (H) 04/27/2020   CHOLHDL 6.7 (H) 04/27/2020   Intolerant to statins. Continue to work on diet/exercise and weight loss.

## 2022-10-19 ENCOUNTER — Encounter: Payer: Self-pay | Admitting: Family

## 2022-10-27 ENCOUNTER — Other Ambulatory Visit (HOSPITAL_BASED_OUTPATIENT_CLINIC_OR_DEPARTMENT_OTHER): Payer: Self-pay

## 2022-11-03 ENCOUNTER — Other Ambulatory Visit (HOSPITAL_BASED_OUTPATIENT_CLINIC_OR_DEPARTMENT_OTHER): Payer: Self-pay

## 2022-11-19 ENCOUNTER — Ambulatory Visit (INDEPENDENT_AMBULATORY_CARE_PROVIDER_SITE_OTHER): Payer: Medicare Other

## 2022-11-19 DIAGNOSIS — E538 Deficiency of other specified B group vitamins: Secondary | ICD-10-CM | POA: Diagnosis not present

## 2022-11-19 MED ORDER — CYANOCOBALAMIN 1000 MCG/ML IJ SOLN
1000.0000 ug | Freq: Once | INTRAMUSCULAR | Status: AC
  Administered 2022-11-19: 1000 ug via INTRAMUSCULAR

## 2022-11-19 NOTE — Progress Notes (Signed)
Sheryl [name] is a 77 y.o. female presents to the office today for monthly B12 injection,, per physician's orders. Original order: 07/15/22 "B12 and folate levels are good. Please continue your b12 injections. " Cyanocobalamin 1000 mg/ml administered today in L deltoid. Patient tolerated injection. Patient due for follow up labs/provider appt: No. Patient next injection due: 1 month, appt made Yes

## 2022-12-01 ENCOUNTER — Telehealth: Payer: Self-pay | Admitting: Family

## 2022-12-01 DIAGNOSIS — G8929 Other chronic pain: Secondary | ICD-10-CM

## 2022-12-01 NOTE — Telephone Encounter (Signed)
Pt stated she is still having issues with her ankles swelling and would like a referral to othro. Please advise, she stated she has discussed this with pcp before. If the referral is placed she would like  a nurse to call her back and let her know.

## 2022-12-02 ENCOUNTER — Other Ambulatory Visit (HOSPITAL_BASED_OUTPATIENT_CLINIC_OR_DEPARTMENT_OTHER): Payer: Self-pay

## 2022-12-04 ENCOUNTER — Other Ambulatory Visit (HOSPITAL_BASED_OUTPATIENT_CLINIC_OR_DEPARTMENT_OTHER): Payer: Self-pay

## 2022-12-16 ENCOUNTER — Other Ambulatory Visit (HOSPITAL_BASED_OUTPATIENT_CLINIC_OR_DEPARTMENT_OTHER): Payer: Self-pay

## 2022-12-16 ENCOUNTER — Other Ambulatory Visit: Payer: Self-pay | Admitting: Family

## 2022-12-16 MED ORDER — AMLODIPINE BESYLATE 5 MG PO TABS
5.0000 mg | ORAL_TABLET | Freq: Every day | ORAL | 0 refills | Status: DC
  Filled 2022-12-16: qty 90, 90d supply, fill #0

## 2022-12-17 ENCOUNTER — Ambulatory Visit (INDEPENDENT_AMBULATORY_CARE_PROVIDER_SITE_OTHER): Payer: Medicare Other

## 2022-12-17 ENCOUNTER — Other Ambulatory Visit (HOSPITAL_BASED_OUTPATIENT_CLINIC_OR_DEPARTMENT_OTHER): Payer: Self-pay

## 2022-12-17 DIAGNOSIS — E538 Deficiency of other specified B group vitamins: Secondary | ICD-10-CM

## 2022-12-17 MED ORDER — CYANOCOBALAMIN 1000 MCG/ML IJ SOLN
1000.0000 ug | Freq: Once | INTRAMUSCULAR | Status: AC
  Administered 2022-12-17: 1000 ug via INTRAMUSCULAR

## 2022-12-17 NOTE — Progress Notes (Signed)
Sheryl [name] is a 76 y.o. female presents to the office today for monthly B12 injection,, per physician's orders. Original order: 07/15/22 "B12 and folate levels are good. Please continue your b12 injections. " Cyanocobalamin 1000 mg/ml administered today in L deltoid. Patient tolerated injection. Patient due for follow up labs/provider appt: No. Patient next injection due: 1 month, appt made Yes 

## 2022-12-26 ENCOUNTER — Other Ambulatory Visit (HOSPITAL_BASED_OUTPATIENT_CLINIC_OR_DEPARTMENT_OTHER): Payer: Self-pay

## 2022-12-29 ENCOUNTER — Other Ambulatory Visit: Payer: Self-pay

## 2022-12-29 ENCOUNTER — Ambulatory Visit (INDEPENDENT_AMBULATORY_CARE_PROVIDER_SITE_OTHER): Payer: Medicare Other | Admitting: Orthopedic Surgery

## 2022-12-29 ENCOUNTER — Other Ambulatory Visit (INDEPENDENT_AMBULATORY_CARE_PROVIDER_SITE_OTHER): Payer: Medicare Other

## 2022-12-29 DIAGNOSIS — M25571 Pain in right ankle and joints of right foot: Secondary | ICD-10-CM | POA: Diagnosis not present

## 2022-12-29 DIAGNOSIS — M25572 Pain in left ankle and joints of left foot: Secondary | ICD-10-CM | POA: Diagnosis not present

## 2022-12-29 DIAGNOSIS — G8929 Other chronic pain: Secondary | ICD-10-CM

## 2023-01-15 ENCOUNTER — Encounter: Payer: Self-pay | Admitting: Orthopedic Surgery

## 2023-01-15 NOTE — Progress Notes (Addendum)
Office Visit Note   Patient: Sheryl [name]           Date of Birth: 11-17-1945           MRN: [id number] Visit Date: 12/29/2022              Requested by: Sandford Craze, NP [address] [place],  [place] [address] PCP: Sandford Craze, NP  Chief Complaint  Patient presents with   Right Ankle - Pain   Left Ankle - Pain      HPI: Patient is a 77 year old woman who presents with chronic bilateral ankle pain.  Patient states she is flat-footed.  Has swelling in her ankles as well as varicose veins.  Patient states she has a history of rheumatoid and psoriatic arthritis.  Patient states she is prediabetic with neuropathy.  Assessment & Plan: Visit Diagnoses:  1. Chronic ankle pain, bilateral     Plan: Will provide an ASO on the right to decrease pressure over the tibial talar joint and to provide support for the posterior tibial tendon.  Recommended range of motion for the left elbow.  Follow-Up Instructions: Return if symptoms worsen or fail to improve.   Ortho Exam  Patient is alert, oriented, no adenopathy, well-dressed, normal affect, normal respiratory effort.  Patient is ambulatory.  Examination patient lacks 20 degrees to full extension of the left elbow this appears to be more of a mechanical block and range of motion.  Patient will work on stretching.  Examination of both feet she has a good dorsalis pedis pulse.  There is no pain to palpation anteriorly over the ankle she has venous insufficiency with varicose veins bilaterally.  She does have pronation and valgus on the right foot with posterior tibial tendon insufficiency.  She has tenderness to palpation of the posterior tibial tendon and tenderness to palpation anteriorly over the ankle.  Imaging: No results found. No images are attached to the encounter.  Labs: Lab Results  Component Value Date   HGBA1C 6.3 10/17/2022   HGBA1C 6.2 01/13/2022   HGBA1C 6.4 05/17/2021    ESRSEDRATE 52 (H) 01/31/2015   ESRSEDRATE 40 (H) 09/10/2009   REPTSTATUS 04/19/2014 FINAL 04/18/2014   CULT  04/18/2014    Multiple bacterial morphotypes present, none predominant. Suggest appropriate recollection if clinically indicated. Performed at [facility]   LABORGA No Salmonella,Shigella,Campylobacter or Yersinia 10/15/2011   LABORGA isolated. 10/15/2011     Lab Results  Component Value Date   ALBUMIN 3.7 10/17/2022   ALBUMIN 3.7 01/13/2022   ALBUMIN 4.0 09/10/2021    Lab Results  Component Value Date   MG 2.3 05/23/2010   Lab Results  Component Value Date   VD25OH 52 12/29/2011    No results found for: "PREALBUMIN"    Latest Ref Rng & Units 09/10/2021   11:50 AM 08/14/2020    1:52 PM 04/27/2020   11:08 AM  CBC EXTENDED  WBC 4.0 - 10.5 K/uL 9.2  6.2  5.4   RBC 3.87 - 5.11 Mil/uL 4.99  4.91  4.77   Hemoglobin 12.0 - 15.0 g/dL 65.7  84.6  96.2   HCT 36.0 - 46.0 % 46.4  45.5  44.0   Platelets 150.0 - 400.0 K/uL 180.0  151  132   NEUT# 1.4 - 7.7 K/uL 6.9  4.0  3,175   Lymph# 0.7 - 4.0 K/uL 1.8  1.6  1,625      There is no height or weight on  file to calculate BMI.  Orders:  Orders Placed This Encounter  Procedures   XR Ankle 2 Views Right   XR Ankle 2 Views Left   No orders of the defined types were placed in this encounter.    Procedures: No procedures performed  Clinical Data: No additional findings.  ROS:  All other systems negative, except as noted in the HPI. Review of Systems  Objective: Vital Signs: There were no vitals taken for this visit.  Specialty Comments:  No specialty comments available.  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   Anemia 06/04/2016   Ectatic aorta (HCC) 03/03/2016   Fatty liver 01/19/2015   Vaginal atrophy 01/08/2015   Colon cancer (HCC) 05/26/2012   Psoriasis 12/29/2011   Sjogren's syndrome (HCC) 03/17/2011   Insomnia 12/25/2010   General medical examination 12/25/2010   UNSPECIFIED VITAMIN D DEFICIENCY 05/15/2010   Asthma 12/26/2009   Diabetes type 2, controlled (HCC) 09/10/2009   Depression 09/10/2009   Allergic rhinitis 09/10/2009   Hypertension 09/10/2009   Hypothyroidism 06/05/2009   PERNICIOUS ANEMIA 06/05/2009   History of tobacco abuse 06/05/2009   GERD 06/05/2009   Rheumatoid arthritis (HCC) 06/05/2009   Mixed dyslipidemia 06/05/2009   Past Medical History:  Diagnosis Date   Anemia    pernicious   Aortic ectasia (HCC)    infrarenal abdomina aorta 2.6 cm   Asthma    Calculus of gallbladder 09/13/2018   Colon cancer (HCC) 2012   Diabetes mellitus    type 2   Fatty liver 01/19/2015   Fatty liver    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    C282Y MUTATION (heterozygote)   History of pericarditis 07/26/2012   Hyperlipidemia    Hypertension    Pericarditis 07/26/2012   Rheumatoid arthritis(714.0)    SBO (small bowel obstruction) (HCC) 05/02/2014   Sjogren's syndrome (HCC) 03/17/2011   Small bowel obstruction (HCC) 03/2014   ?due to adhesions from colon surgery per pt   Thyroid disease    hypothyroid   Tobacco abuse     Family History  Problem Relation Age of Onset   Heart disease Other        CAD   Diabetes Other    Hyperlipidemia Other    Stroke Other     Past Surgical History:  Procedure Laterality Date   CHOLECYSTECTOMY  10/23/2016   COLON SURGERY  8/.23/13   tumor removed from sigmoid colon.   DILATION AND CURETTAGE OF UTERUS  1975   HERNIA REPAIR  10/23/2016   KNEE SURGERY  2006   arthroscopic left knee   KNEE SURGERY  2001   right knee   Social History   Occupational History    Occupation: unemployed    Employer: DISABLED  Tobacco Use   Smoking status: Former    Types: Cigarettes    Quit date: 04/25/2021    Years since quitting: 1.7   Smokeless tobacco: Never   Tobacco comments:    smokes occasionally but not everyday  Substance and Sexual Activity   Alcohol use: Yes    Alcohol/week: 1.0 standard drink of alcohol    Types: 1 Glasses of wine per week    Comment:  occasionally   Drug use: Yes    Comment: Smokes "medical grade" marijuana 2 puffs 3 x weekly for rheumatoid arthritis.per pt   Sexual activity: Never

## 2023-01-16 ENCOUNTER — Ambulatory Visit (INDEPENDENT_AMBULATORY_CARE_PROVIDER_SITE_OTHER): Payer: Medicare Other

## 2023-01-16 DIAGNOSIS — E538 Deficiency of other specified B group vitamins: Secondary | ICD-10-CM

## 2023-01-16 MED ORDER — CYANOCOBALAMIN 1000 MCG/ML IJ SOLN
1000.0000 ug | Freq: Once | INTRAMUSCULAR | Status: AC
  Administered 2023-01-16: 1000 ug via INTRAMUSCULAR

## 2023-01-16 NOTE — Progress Notes (Signed)
Sheryl [name] is a 77 y.o. female presents to the office today for monthly B12 injection,, per physician's orders. Original order: 07/15/22 "B12 and folate levels are good. Please continue your b12 injections. " Cyanocobalamin 1000 mg/ml administered today in L deltoid. Patient tolerated injection. Patient due for follow up labs/provider appt: No. Patient next injection due: 1 month, appt.Pt will get next monthly B-12 at her office visit with [name] on 02/16/23

## 2023-01-29 ENCOUNTER — Encounter: Payer: Self-pay | Admitting: Orthopedic Surgery

## 2023-01-29 ENCOUNTER — Ambulatory Visit (INDEPENDENT_AMBULATORY_CARE_PROVIDER_SITE_OTHER): Payer: Medicare Other | Admitting: Orthopedic Surgery

## 2023-01-29 DIAGNOSIS — G8929 Other chronic pain: Secondary | ICD-10-CM

## 2023-01-29 DIAGNOSIS — M25572 Pain in left ankle and joints of left foot: Secondary | ICD-10-CM

## 2023-01-29 DIAGNOSIS — M25571 Pain in right ankle and joints of right foot: Secondary | ICD-10-CM

## 2023-01-29 NOTE — Progress Notes (Signed)
Office Visit Note   Patient: Sheryl [name]           Date of Birth: 1946-04-28           MRN: [id number] Visit Date: 01/29/2023              Requested by: Sandford Craze, NP [id number] [place],  [place] [address] PCP: Sandford Craze, NP  Chief Complaint  Patient presents with   Right Ankle - Follow-up   Left Ankle - Follow-up      HPI: Patient is a 77 year old woman who is seen in follow-up for ankle pain.  Patient states that the ASO on the right ankle significantly improved her symptoms however she had a pinching sensation in the anterior aspect of the ankle from the brace.  Assessment & Plan: Visit Diagnoses:  1. Chronic ankle pain, bilateral     Plan: Will try an Aircast bilaterally this should help relieve the symptoms anteriorly yet provide her the support she needs.  She will use her compression socks as well to help with the edema.  Follow-Up Instructions: No follow-ups on file.   Ortho Exam  Patient is alert, oriented, no adenopathy, well-dressed, normal affect, normal respiratory effort. Examination patient has pitting edema of both legs with venous swelling but no open ulcers no cellulitis or dermatitis.  Her ankles have pain-free range of motion.  No ligamentous instability.  Imaging: No results found. No images are attached to the encounter.  Labs: Lab Results  Component Value Date   HGBA1C 6.3 10/17/2022   HGBA1C 6.2 01/13/2022   HGBA1C 6.4 05/17/2021   ESRSEDRATE 52 (H) 01/31/2015   ESRSEDRATE 40 (H) 09/10/2009   REPTSTATUS 04/19/2014 FINAL 04/18/2014   CULT  04/18/2014    Multiple bacterial morphotypes present, none predominant. Suggest appropriate recollection if clinically indicated. Performed at [facility]   LABORGA No Salmonella,Shigella,Campylobacter or Yersinia 10/15/2011   LABORGA isolated. 10/15/2011     Lab Results  Component Value Date   ALBUMIN 3.7 10/17/2022   ALBUMIN 3.7 01/13/2022    ALBUMIN 4.0 09/10/2021    Lab Results  Component Value Date   MG 2.3 05/23/2010   Lab Results  Component Value Date   VD25OH 52 12/29/2011    No results found for: "PREALBUMIN"    Latest Ref Rng & Units 09/10/2021   11:50 AM 08/14/2020    1:52 PM 04/27/2020   11:08 AM  CBC EXTENDED  WBC 4.0 - 10.5 K/uL 9.2  6.2  5.4   RBC 3.87 - 5.11 Mil/uL 4.99  4.91  4.77   Hemoglobin 12.0 - 15.0 g/dL 78.4  69.6  29.5   HCT 36.0 - 46.0 % 46.4  45.5  44.0   Platelets 150.0 - 400.0 K/uL 180.0  151  132   NEUT# 1.4 - 7.7 K/uL 6.9  4.0  3,175   Lymph# 0.7 - 4.0 K/uL 1.8  1.6  1,625      There is no height or weight on file to calculate BMI.  Orders:  No orders of the defined types were placed in this encounter.  No orders of the defined types were placed in this encounter.    Procedures: No procedures performed  Clinical Data: No additional findings.  ROS:  All other systems negative, except as noted in the HPI. Review of Systems  Objective: Vital Signs: There were no vitals taken for this visit.  Specialty Comments:  No specialty comments available.  PMFS  History: Patient Active Problem List   Diagnosis Date Noted   Right ankle swelling 10/17/2022   History of COVID-19 05/09/2022   B12 deficiency 05/17/2021   Adverse reaction to COVID-19 vaccine 02/15/2021   Ventral hernia 09/13/2018   Atherosclerotic vascular disease 09/13/2018   Overweight 09/13/2018   Centrilobular emphysema (HCC) 01/29/2017   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   Anemia 06/04/2016   Ectatic aorta (HCC) 03/03/2016   Fatty liver 01/19/2015   Vaginal atrophy 01/08/2015   Colon cancer (HCC) 05/26/2012   Psoriasis 12/29/2011   Sjogren's syndrome (HCC) 03/17/2011   Insomnia 12/25/2010   General medical examination 12/25/2010   UNSPECIFIED VITAMIN D DEFICIENCY 05/15/2010   Asthma 12/26/2009   Diabetes type 2, controlled (HCC) 09/10/2009   Depression 09/10/2009   Allergic rhinitis 09/10/2009    Hypertension 09/10/2009   Hypothyroidism 06/05/2009   PERNICIOUS ANEMIA 06/05/2009   History of tobacco abuse 06/05/2009   GERD 06/05/2009   Rheumatoid arthritis (HCC) 06/05/2009   Mixed dyslipidemia 06/05/2009   Past Medical History:  Diagnosis Date   Anemia    pernicious   Aortic ectasia (HCC)    infrarenal abdomina aorta 2.6 cm   Asthma    Calculus of gallbladder 09/13/2018   Colon cancer (HCC) 2012   Diabetes mellitus    type 2   Fatty liver 01/19/2015   Fatty liver    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    C282Y MUTATION (heterozygote)   History of pericarditis 07/26/2012   Hyperlipidemia    Hypertension    Pericarditis 07/26/2012   Rheumatoid arthritis(714.0)    SBO (small bowel obstruction) (HCC) 05/02/2014   Sjogren's syndrome (HCC) 03/17/2011   Small bowel obstruction (HCC) 03/2014   ?due to adhesions from colon surgery per pt   Thyroid disease    hypothyroid   Tobacco abuse     Family History  Problem Relation Age of Onset   Heart disease Other        CAD   Diabetes Other    Hyperlipidemia Other    Stroke Other     Past Surgical History:  Procedure Laterality Date   CHOLECYSTECTOMY  10/23/2016   COLON SURGERY  8/.23/13   tumor removed from sigmoid colon.   DILATION AND CURETTAGE OF UTERUS  1975   HERNIA REPAIR  10/23/2016   KNEE SURGERY  2006   arthroscopic left knee   KNEE SURGERY  2001   right knee   Social History   Occupational History   Occupation: unemployed    Employer: DISABLED  Tobacco Use   Smoking status: Former    Types: Cigarettes    Quit date: 04/25/2021    Years since quitting: 1.7   Smokeless tobacco: Never   Tobacco comments:    smokes occasionally but not everyday  Substance and Sexual Activity   Alcohol use: Yes    Alcohol/week: 1.0 standard drink of alcohol    Types: 1 Glasses of wine per week    Comment: occasionally   Drug use: Yes    Comment: Smokes "medical grade" marijuana 2 puffs 3 x weekly for rheumatoid  arthritis.per pt   Sexual activity: Never

## 2023-01-30 ENCOUNTER — Telehealth: Payer: Self-pay | Admitting: Orthopedic Surgery

## 2023-01-30 NOTE — Telephone Encounter (Signed)
Can you make an addendum to the note 12/29/2022 so that xray impression is included on the dictation. I have talked to the pt about the other issue in the message below.

## 2023-01-30 NOTE — Telephone Encounter (Signed)
Sheryl [name] called in to get clarification on a few things from her last office visits.  First thing, her office visit note from 5/6 says that "no x-rays were obtained" but she did, in fact, have x-rays at that appointment. She is concerned this will mess up her insurance claim.  Can Dr. Lajoyce Corners do an addendum?  Also, at her last visit since the ASO we gave her was too tight we suggested bilateral air casts.  She would like to know if the ones you can purchase on [place] will work for her diagnosis? She doesn't want to order the wrong thing.  Please call to discuss at [phone].

## 2023-02-02 NOTE — Telephone Encounter (Signed)
Do oyu know what we can do about this?

## 2023-02-04 ENCOUNTER — Other Ambulatory Visit (HOSPITAL_BASED_OUTPATIENT_CLINIC_OR_DEPARTMENT_OTHER): Payer: Self-pay

## 2023-02-04 ENCOUNTER — Other Ambulatory Visit: Payer: Self-pay | Admitting: Family

## 2023-02-04 MED ORDER — TRELEGY ELLIPTA 200-62.5-25 MCG/ACT IN AEPB
1.0000 | INHALATION_SPRAY | Freq: Every day | RESPIRATORY_TRACT | 3 refills | Status: DC
  Filled 2023-02-04: qty 60, 30d supply, fill #0
  Filled 2023-03-02 – 2023-03-06 (×3): qty 60, 30d supply, fill #1
  Filled 2023-03-26 – 2023-04-04 (×2): qty 60, 30d supply, fill #2
  Filled 2023-04-30: qty 60, 30d supply, fill #3

## 2023-02-16 ENCOUNTER — Ambulatory Visit (INDEPENDENT_AMBULATORY_CARE_PROVIDER_SITE_OTHER): Payer: Medicare Other | Admitting: Family

## 2023-02-16 ENCOUNTER — Other Ambulatory Visit: Payer: Self-pay | Admitting: Family

## 2023-02-16 ENCOUNTER — Other Ambulatory Visit (HOSPITAL_BASED_OUTPATIENT_CLINIC_OR_DEPARTMENT_OTHER): Payer: Self-pay

## 2023-02-16 ENCOUNTER — Telehealth: Payer: Self-pay | Admitting: Family

## 2023-02-16 VITALS — BP 138/63 | HR 86 | Temp 98.5°F | Wt 180.0 lb

## 2023-02-16 DIAGNOSIS — J4520 Mild intermittent asthma, uncomplicated: Secondary | ICD-10-CM | POA: Diagnosis not present

## 2023-02-16 DIAGNOSIS — R809 Proteinuria, unspecified: Secondary | ICD-10-CM

## 2023-02-16 DIAGNOSIS — E538 Deficiency of other specified B group vitamins: Secondary | ICD-10-CM | POA: Diagnosis not present

## 2023-02-16 DIAGNOSIS — R739 Hyperglycemia, unspecified: Secondary | ICD-10-CM

## 2023-02-16 DIAGNOSIS — I10 Essential (primary) hypertension: Secondary | ICD-10-CM

## 2023-02-16 DIAGNOSIS — C189 Malignant neoplasm of colon, unspecified: Secondary | ICD-10-CM

## 2023-02-16 DIAGNOSIS — E039 Hypothyroidism, unspecified: Secondary | ICD-10-CM | POA: Diagnosis not present

## 2023-02-16 DIAGNOSIS — E119 Type 2 diabetes mellitus without complications: Secondary | ICD-10-CM | POA: Diagnosis not present

## 2023-02-16 DIAGNOSIS — Z1211 Encounter for screening for malignant neoplasm of colon: Secondary | ICD-10-CM

## 2023-02-16 DIAGNOSIS — K219 Gastro-esophageal reflux disease without esophagitis: Secondary | ICD-10-CM | POA: Diagnosis not present

## 2023-02-16 DIAGNOSIS — E559 Vitamin D deficiency, unspecified: Secondary | ICD-10-CM | POA: Diagnosis not present

## 2023-02-16 LAB — COMPREHENSIVE METABOLIC PANEL
ALT: 36 U/L — ABNORMAL HIGH (ref 0–35)
AST: 42 U/L — ABNORMAL HIGH (ref 0–37)
Albumin: 3.7 g/dL (ref 3.5–5.2)
Alkaline Phosphatase: 94 U/L (ref 39–117)
BUN: 15 mg/dL (ref 6–23)
CO2: 28 mEq/L (ref 19–32)
Calcium: 9.2 mg/dL (ref 8.4–10.5)
Chloride: 100 mEq/L (ref 96–112)
Creatinine, Ser: 0.94 mg/dL (ref 0.40–1.20)
GFR: 59.29 mL/min — ABNORMAL LOW (ref >60.00)
Glucose, Bld: 108 mg/dL — ABNORMAL HIGH (ref 70–99)
Potassium: 4.8 mEq/L (ref 3.5–5.1)
Sodium: 134 mEq/L — ABNORMAL LOW (ref 135–145)
Total Bilirubin: 0.7 mg/dL (ref 0.2–1.2)
Total Protein: 8.3 g/dL (ref 6.0–8.3)

## 2023-02-16 LAB — TSH: TSH: 1.52 u[IU]/mL (ref 0.35–5.50)

## 2023-02-16 LAB — MICROALBUMIN / CREATININE URINE RATIO
Creatinine,U: 206.1 mg/dL
Microalb Creat Ratio: 3 mg/g (ref 0.0–30.0)
Microalb, Ur: 6.1 mg/dL — ABNORMAL HIGH (ref 0.0–1.9)

## 2023-02-16 LAB — HEMOGLOBIN A1C: Hgb A1c MFr Bld: 6 % (ref 4.6–6.5)

## 2023-02-16 MED ORDER — AMLODIPINE BESYLATE 5 MG PO TABS
5.0000 mg | ORAL_TABLET | Freq: Every day | ORAL | 1 refills | Status: DC
  Filled 2023-02-16 – 2023-03-26 (×2): qty 90, 90d supply, fill #0
  Filled 2023-07-06: qty 90, 90d supply, fill #1

## 2023-02-16 MED ORDER — CYANOCOBALAMIN 1000 MCG/ML IJ SOLN
1000.0000 ug | Freq: Once | INTRAMUSCULAR | Status: AC
Start: 2023-02-16 — End: 2023-02-16
  Administered 2023-02-16: 1000 ug via INTRAMUSCULAR

## 2023-02-16 MED ORDER — LEVOTHYROXINE SODIUM 100 MCG PO TABS
100.0000 ug | ORAL_TABLET | Freq: Every day | ORAL | 1 refills | Status: DC
  Filled 2023-02-16 – 2023-03-10 (×5): qty 90, 90d supply, fill #0
  Filled 2023-03-26 – 2023-06-04 (×2): qty 90, 90d supply, fill #1

## 2023-02-16 NOTE — Assessment & Plan Note (Signed)
Lab Results  Component Value Date   TSH 2.91 10/17/2022   Clinically stable on synthroid.

## 2023-02-16 NOTE — Addendum Note (Signed)
Addended by: Wilford Corner on: 02/16/2023 01:14 PM   Modules accepted: Orders

## 2023-02-16 NOTE — Telephone Encounter (Signed)
A1C has come down a bit to 6.0- good work.  Liver and Kidney function is stable since last check.   There is some protein in her urine. I would recommend that she begin a low dose of losartan once daily to help protect her kidneys. Repeat bmet in 1-2 weeks, dx microalbuminuria.  Rx pending.   Send me some updated BP readings after she start the losartan.

## 2023-02-16 NOTE — Telephone Encounter (Signed)
Electronic request made 

## 2023-02-16 NOTE — Assessment & Plan Note (Signed)
Having LE edema with amlodipine but still wants to continue amlodipine. BP stable.

## 2023-02-16 NOTE — Telephone Encounter (Signed)
See mychart.  

## 2023-02-16 NOTE — Assessment & Plan Note (Signed)
On multiple supplements. Check her vit D.

## 2023-02-16 NOTE — Telephone Encounter (Signed)
Please request DM eye exam from [facility].  

## 2023-02-16 NOTE — Assessment & Plan Note (Signed)
Stable on pantoprazole. Continue same.  

## 2023-02-16 NOTE — Patient Instructions (Signed)
VISIT SUMMARY:  During your visit, we discussed your worsening asthma symptoms, ankle swelling, and cold symptoms. Your rheumatoid arthritis and reflux seem to be stable. We also discussed your blood pressure medication, amlodipine, which may be contributing to your ankle swelling.  YOUR PLAN:  -ASTHMA: Your asthma symptoms have worsened, with increased wheezing and frequent use of your rescue inhaler. We will continue with your current treatment (Trelegy in the morning) and monitor your symptoms.  -ANKLE ARTHRITIS: You reported pain and swelling in your ankles. Continue using your brace and proceed with getting a new fitted brace for both ankles.  -HYPERTENSION: Your blood pressure was measured at 138/63. We may consider increasing your lisinopril dosage from 10 to 20mg  and discontinuing amlodipine if your ankle swelling becomes too bothersome.  -GENERAL HEALTH MAINTENANCE: We will check your liver enzymes and kidney function with lab tests. You received a B12 injection today. We recommend getting an RSV vaccine. Also, you are due for a mammogram and colonoscopy, so we will schedule these screenings.  INSTRUCTIONS:  Please monitor your asthma symptoms and ankle swelling. If your symptoms worsen, please contact the office. We will schedule your mammogram and colonoscopy. Please also remember to get your RSV vaccine.

## 2023-02-16 NOTE — Progress Notes (Signed)
Subjective:     Patient ID: Sheryl [name], female    DOB: 23-Aug-1946, 77 y.o.   MRN: [id number]  Chief Complaint  Patient presents with   Headache    Better with ibuprofen   Diabetes    Here for follow up   Hypertension    Here for follow up, negative covid test at home today    HPI  Discussed the use of AI scribe software for clinical note transcription with the patient, who gave verbal consent to proceed.  History of Present Illness   The patient, with a history of asthma, arthritis, and rheumatoid conditions, presents with worsening asthma symptoms. She reports increased wheezing and reliance on her rescue inhaler, which she notes only provides relief for about five to six hours. The patient also mentions experiencing cold symptoms, but is unsure if these are related to her asthma or a separate issue. She states that she tested negative for covid at home.  In addition to her respiratory concerns, the patient is dealing with edema in her ankles. She describes the swelling as significant and painful, particularly after periods of activity. The patient believes the edema may be related to her blood pressure medication, amlodipine, but also acknowledges that her arthritis and rheumatoid conditions could be contributing factors.  The patient also discusses her blood pressure, noting that while it is generally well-controlled, she has concerns about potential side effects of her medication. She expresses a desire to maintain her current medication regimen, as she does not believe the edema is significantly impacting her quality of life.  Finally, the patient mentions her ongoing management of arthritis and rheumatoid conditions. She notes that she has been less active due to pain and swelling in her ankles, and is exploring different exercise options to help manage her symptoms.          Health Maintenance Due  Topic Date Due   OPHTHALMOLOGY EXAM  12/23/2016   COVID-19  Vaccine (6 - 2023-24 season) 09/09/2022   Diabetic kidney evaluation - Urine ACR  01/14/2023   FOOT EXAM  01/14/2023   DTaP/Tdap/Td (3 - Td or Tdap) 02/15/2023    Past Medical History:  Diagnosis Date   Anemia    pernicious   Aortic ectasia (HCC)    infrarenal abdomina aorta 2.6 cm   Asthma    Calculus of gallbladder 09/13/2018   Colon cancer (HCC) 2012   Diabetes mellitus    type 2   Fatty liver 01/19/2015   Fatty liver    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   DILATION AND CURETTAGE OF UTERUS  1975   HERNIA REPAIR  10/23/2016   KNEE SURGERY  2006   arthroscopic left knee   KNEE SURGERY  2001   right knee    Family History  Problem Relation Age of Onset   Heart disease Other        CAD  Diabetes Other    Hyperlipidemia Other    Stroke Other     Social History   Socioeconomic History   Marital status: Single    Spouse name: Not on file   Number of children: 1   Years of education: Not on file   Highest education level: Some college, no degree  Occupational History   Occupation: unemployed    Employer: DISABLED  Tobacco Use   Smoking status: Former    Types: Cigarettes    Quit date: 04/25/2021    Years since quitting: 1.8   Smokeless tobacco: Never   Tobacco comments:    smokes occasionally but not everyday  Substance and Sexual Activity   Alcohol use: Yes    Alcohol/week: 1.0 standard drink of alcohol    Types: 1 Glasses  of wine per week    Comment: occasionally   Drug use: Yes    Comment: Smokes "medical grade" marijuana 2 puffs 3 x weekly for rheumatoid arthritis.per pt   Sexual activity: Never  Other Topics Concern   Not on file  Social History Narrative   Regular exercise:  Stretching exercises. Resistance bands   Caffeine: 1 mug (2cus) daily.      Social Determinants of Health   Financial Resource Strain: Low Risk  (02/09/2023)   Overall Financial Resource Strain (CARDIA)    Difficulty of Paying Living Expenses: Not very hard  Food Insecurity: No Food Insecurity (02/09/2023)   Hunger Vital Sign    Worried About Running Out of Food in the Last Year: Never true    Ran Out of Food in the Last Year: Never true  Transportation Needs: No Transportation Needs (02/09/2023)   PRAPARE - Administrator, Civil Service (Medical): No    Lack of Transportation (Non-Medical): No  Physical Activity: Unknown (02/09/2023)   Exercise Vital Sign    Days of Exercise per Week: 0 days    Minutes of Exercise per Session: Not on file  Stress: No Stress Concern Present (02/09/2023)   [employer] of Occupational Health - Occupational Stress Questionnaire    Feeling of Stress : Only a little  Social Connections: Moderately Isolated (02/09/2023)   Social Connection and Isolation Panel [NHANES]    Frequency of Communication with Friends and Family: More than three times a week    Frequency of Social Gatherings with Friends and Family: Once a week    Attends Religious Services: Never    Database administrator or Organizations: Yes    Attends Engineer, structural: More than 4 times per year    Marital Status: Divorced  Intimate Partner Violence: Not At Risk (08/08/2022)   Humiliation, Afraid, Rape, and Kick questionnaire    Fear of Current or Ex-Partner: No    Emotionally Abused: No    Physically Abused: No    Sexually Abused: No    Outpatient Medications Prior to Visit  Medication Sig  Dispense Refill   albuterol (VENTOLIN HFA) 108 (90 Base) MCG/ACT inhaler Inhale 2 puffs into the lungs every 6 (six) hours as needed for wheezing or shortness of breath. 54 g 1   aspirin 81 MG tablet Take 81 mg by mouth daily.     clobetasol cream (TEMOVATE) 0.05 % Apply to affected areas 2 times a day for 2 weeks on, and 1 week off. Repeat cycle as needed. 60 g 0   Fluticasone-Umeclidin-Vilant (TRELEGY ELLIPTA) 200-62.5-25 MCG/ACT AEPB Inhale 1 puff into the lungs daily. 60 each 3  ibuprofen (ADVIL) 600 MG tablet Take 1 tablet (600 mg total) by mouth every 6-8 hours as needed for pain and swelling. 20 tablet 0   montelukast (SINGULAIR) 10 MG tablet Take 1 tablet (10 mg total) by mouth at bedtime. 90 tablet 1   multivitamin (THERAGRAN) per tablet Take 1 tablet by mouth daily.     NON FORMULARY TUMERIC MILK.     NON FORMULARY CBD. Derivative of marijuana without the marijuana.     pantoprazole (PROTONIX) 40 MG tablet Take 1 tablet (40 mg total) by mouth daily. 90 tablet 3   Probiotic Product (PROBIOTIC DAILY PO) Take 1 capsule by mouth daily.     VOLTAREN 1 % GEL APPLY 2 GRAMS TOPICALLY 4 TIMES AS NEEDED 100 g 2   amLODipine (NORVASC) 5 MG tablet Take 1 tablet (5 mg total) by mouth daily. 90 tablet 0   clindamycin (CLEOCIN) 150 MG capsule Take two capsules by mouth 1 hour prior to surgery then take 1 capsule by mouth four times daily until gone. 19 capsule 0   levothyroxine (SYNTHROID) 100 MCG tablet Take 1 tablet (100 mcg total) by mouth daily. 90 tablet 1   No facility-administered medications prior to visit.    Allergies  Allergen Reactions   Levofloxacin Shortness Of Breath and Rash   Mold Extract [Trichophyton] Anaphylaxis   Ativan [Lorazepam]     Visual hallucinations.  Visual hallucinations.    Cefuroxime Axetil     REACTION: asthma, cough   Hydrocodone Bit-Homatrop Mbr Nausea Only   Lisinopril     Diarrhea hives   Rosuvastatin Calcium     Muscle pain, syncope   Statins      Muscle pain, syncope   Zetia [Ezetimibe] Diarrhea   Hydrocodone-Acetaminophen Nausea Only    ROS See HPI    Objective:    Physical Exam Constitutional:      General: She is not in acute distress.    Appearance: Normal appearance. She is well-developed.  HENT:     Head: Normocephalic and atraumatic.     Right Ear: External ear normal.     Left Ear: External ear normal.  Eyes:     General: No scleral icterus. Neck:     Thyroid: No thyromegaly.  Cardiovascular:     Rate and Rhythm: Normal rate and regular rhythm.     Heart sounds: Normal heart sounds. No murmur heard. Pulmonary:     Effort: Pulmonary effort is normal. No respiratory distress.     Breath sounds: Wheezing (soft/expiratory) present.  Musculoskeletal:     Cervical back: Neck supple.     Right lower leg: 2+ Edema present.     Left lower leg: 2+ Edema present.  Skin:    General: Skin is warm and dry.  Neurological:     Mental Status: She is alert and oriented to person, place, and time.  Psychiatric:        Mood and Affect: Mood normal.        Behavior: Behavior normal.        Thought Content: Thought content normal.        Judgment: Judgment normal.      BP 138/63 (BP Location: Right Arm, Patient Position: Sitting, Cuff Size: Small)   Pulse 86   Temp 98.5 F (36.9 C) (Oral)   Wt 180 lb (81.6 kg)   SpO2 96%   BMI 32.92 kg/m  Wt Readings from Last 3 Encounters:  02/16/23 180 lb (81.6 kg)  10/17/22 185 lb (  83.9 kg)  05/09/22 182 lb (82.6 kg)       Assessment & Plan:   Problem List Items Addressed This Visit       Unprioritized   Vitamin D deficiency    On multiple supplements. Check her vit D.       Hypothyroidism - Primary    Lab Results  Component Value Date   TSH 2.91 10/17/2022  Clinically stable on synthroid.       Relevant Medications   levothyroxine (SYNTHROID) 100 MCG tablet   Other Relevant Orders   TSH   Hypertension    Having LE edema with amlodipine but still wants to  continue amlodipine. BP stable.       Relevant Medications   amLODipine (NORVASC) 5 MG tablet   GERD    Stable on pantoprazole. Continue same.      Diabetes type 2, controlled (HCC)    Lab Results  Component Value Date   HGBA1C 6.3 10/17/2022   HGBA1C 6.2 01/13/2022   HGBA1C 6.4 05/17/2021   Lab Results  Component Value Date   MICROALBUR 1.2 01/13/2022   LDLCALC 134 (H) 10/17/2022   CREATININE 1.03 10/17/2022        Relevant Orders   Comp Met (CMET)   Urine Microalbumin w/creat. ratio   Colon cancer (HCC)   Relevant Orders   CEA   Asthma    Offered a steroid taper.  She declines as she feels she is managing currently with Trelegy and her rescue inhaler. She will let me know if her symptoms worsen.       Other Visit Diagnoses     Screening for colon cancer       Relevant Orders   Ambulatory referral to Gastroenterology   Hyperglycemia       Relevant Orders   HgB A1c       I have discontinued Coriana L. Bovenzi's clindamycin. I am also having her maintain her aspirin, multivitamin, NON FORMULARY, Probiotic Product (PROBIOTIC DAILY PO), NON FORMULARY, Voltaren, albuterol, ibuprofen, pantoprazole, montelukast, clobetasol cream, Trelegy Ellipta, levothyroxine, and amLODipine.  Meds ordered this encounter  Medications   levothyroxine (SYNTHROID) 100 MCG tablet    Sig: Take 1 tablet (100 mcg total) by mouth daily.    Dispense:  90 tablet    Refill:  1    Order Specific Question:   Supervising Provider    Answer:   Danise Edge A [[id number]]   amLODipine (NORVASC) 5 MG tablet    Sig: Take 1 tablet (5 mg total) by mouth daily.    Dispense:  90 tablet    Refill:  1    Order Specific Question:   Supervising Provider    Answer:   Danise Edge A [[id number]]

## 2023-02-16 NOTE — Assessment & Plan Note (Signed)
Offered a steroid taper.  She declines as she feels she is managing currently with Trelegy and her rescue inhaler. She will let me know if her symptoms worsen.

## 2023-02-16 NOTE — Assessment & Plan Note (Signed)
Lab Results  Component Value Date   HGBA1C 6.3 10/17/2022   HGBA1C 6.2 01/13/2022   HGBA1C 6.4 05/17/2021   Lab Results  Component Value Date   MICROALBUR 1.2 01/13/2022   LDLCALC 134 (H) 10/17/2022   CREATININE 1.03 10/17/2022

## 2023-02-17 ENCOUNTER — Other Ambulatory Visit: Payer: Self-pay | Admitting: Family

## 2023-02-17 ENCOUNTER — Encounter: Payer: Self-pay | Admitting: Family

## 2023-02-17 ENCOUNTER — Telehealth: Payer: Self-pay | Admitting: Family

## 2023-02-17 ENCOUNTER — Other Ambulatory Visit (HOSPITAL_BASED_OUTPATIENT_CLINIC_OR_DEPARTMENT_OTHER): Payer: Self-pay

## 2023-02-17 ENCOUNTER — Other Ambulatory Visit: Payer: Self-pay

## 2023-02-17 LAB — CEA: CEA: 5.4 ng/mL — ABNORMAL HIGH

## 2023-02-17 MED ORDER — PAXLOVID (300/100) 20 X 150 MG & 10 X 100MG PO TBPK
ORAL_TABLET | ORAL | 0 refills | Status: DC
  Filled 2023-02-17: qty 30, 5d supply, fill #0

## 2023-02-17 MED ORDER — LOSARTAN POTASSIUM 25 MG PO TABS
25.0000 mg | ORAL_TABLET | Freq: Every day | ORAL | 0 refills | Status: DC
  Filled 2023-02-17 – 2023-02-18 (×9): qty 90, 90d supply, fill #0

## 2023-02-17 MED ORDER — PAXLOVID (300/100) 20 X 150 MG & 10 X 100MG PO TBPK: ORAL_TABLET | ORAL | 0 refills | Status: DC

## 2023-02-17 NOTE — Telephone Encounter (Signed)
Rx sent to [facility] as requested, patient notified.

## 2023-02-17 NOTE — Telephone Encounter (Signed)
Patient notified of results, new prescription and need to repeat labs. She will like to wait until she recovers from covid to start medication.  She will call to schedule repeat bmet 2 weeks after she starts taking

## 2023-02-17 NOTE — Telephone Encounter (Signed)
Pt said that [facility] system is down so patient is requesting her Paxlovid prescription to be [facility] to [facility] [address] so she can have pharmacy deliver this medication to her. Pt would also like to have [facility] [address] [facility] on as a secondary pharmacy

## 2023-02-18 ENCOUNTER — Other Ambulatory Visit (HOSPITAL_COMMUNITY): Payer: Self-pay

## 2023-02-18 ENCOUNTER — Other Ambulatory Visit: Payer: Self-pay

## 2023-02-18 ENCOUNTER — Other Ambulatory Visit (HOSPITAL_BASED_OUTPATIENT_CLINIC_OR_DEPARTMENT_OTHER): Payer: Self-pay

## 2023-02-18 ENCOUNTER — Encounter: Payer: Self-pay | Admitting: Family

## 2023-02-19 ENCOUNTER — Encounter: Payer: Self-pay | Admitting: Family

## 2023-03-02 ENCOUNTER — Other Ambulatory Visit (HOSPITAL_BASED_OUTPATIENT_CLINIC_OR_DEPARTMENT_OTHER): Payer: Self-pay | Admitting: Family

## 2023-03-02 DIAGNOSIS — Z1231 Encounter for screening mammogram for malignant neoplasm of breast: Secondary | ICD-10-CM

## 2023-03-06 ENCOUNTER — Other Ambulatory Visit (HOSPITAL_COMMUNITY): Payer: Self-pay

## 2023-03-06 ENCOUNTER — Other Ambulatory Visit (HOSPITAL_BASED_OUTPATIENT_CLINIC_OR_DEPARTMENT_OTHER): Payer: Self-pay

## 2023-03-09 ENCOUNTER — Other Ambulatory Visit (HOSPITAL_COMMUNITY): Payer: Self-pay

## 2023-03-09 ENCOUNTER — Other Ambulatory Visit (HOSPITAL_BASED_OUTPATIENT_CLINIC_OR_DEPARTMENT_OTHER): Payer: Self-pay

## 2023-03-10 ENCOUNTER — Other Ambulatory Visit (HOSPITAL_BASED_OUTPATIENT_CLINIC_OR_DEPARTMENT_OTHER): Payer: Self-pay

## 2023-03-18 ENCOUNTER — Ambulatory Visit (INDEPENDENT_AMBULATORY_CARE_PROVIDER_SITE_OTHER): Payer: Medicare Other

## 2023-03-18 DIAGNOSIS — E538 Deficiency of other specified B group vitamins: Secondary | ICD-10-CM | POA: Diagnosis not present

## 2023-03-18 MED ORDER — CYANOCOBALAMIN 1000 MCG/ML IJ SOLN
1000.0000 ug | Freq: Once | INTRAMUSCULAR | Status: AC
Start: 2023-03-18 — End: 2023-03-18
  Administered 2023-03-18: 1000 ug via INTRAMUSCULAR

## 2023-03-18 NOTE — Progress Notes (Signed)
Pt here for monthly B12 injection per [name]  B12 given IM, and pt tolerated injection well.  Next B12 injection scheduled for next month

## 2023-03-19 ENCOUNTER — Encounter (HOSPITAL_BASED_OUTPATIENT_CLINIC_OR_DEPARTMENT_OTHER): Payer: Self-pay

## 2023-03-19 ENCOUNTER — Ambulatory Visit (HOSPITAL_BASED_OUTPATIENT_CLINIC_OR_DEPARTMENT_OTHER)
Admission: RE | Admit: 2023-03-19 | Discharge: 2023-03-19 | Disposition: A | Discharge status: Home / Self Care | Payer: Medicare Other | Source: Ambulatory Visit | Attending: Family | Admitting: Family

## 2023-03-19 DIAGNOSIS — Z1231 Encounter for screening mammogram for malignant neoplasm of breast: Secondary | ICD-10-CM | POA: Insufficient documentation

## 2023-03-25 ENCOUNTER — Encounter (INDEPENDENT_AMBULATORY_CARE_PROVIDER_SITE_OTHER): Payer: Self-pay

## 2023-03-26 ENCOUNTER — Other Ambulatory Visit (HOSPITAL_BASED_OUTPATIENT_CLINIC_OR_DEPARTMENT_OTHER): Payer: Self-pay

## 2023-03-26 ENCOUNTER — Other Ambulatory Visit: Payer: Self-pay | Admitting: Family

## 2023-03-26 MED ORDER — MONTELUKAST SODIUM 10 MG PO TABS
10.0000 mg | ORAL_TABLET | Freq: Every day | ORAL | 0 refills | Status: DC
  Filled 2023-03-26: qty 90, 90d supply, fill #0

## 2023-04-05 ENCOUNTER — Other Ambulatory Visit (HOSPITAL_BASED_OUTPATIENT_CLINIC_OR_DEPARTMENT_OTHER): Payer: Self-pay

## 2023-04-08 ENCOUNTER — Other Ambulatory Visit (HOSPITAL_BASED_OUTPATIENT_CLINIC_OR_DEPARTMENT_OTHER): Payer: Self-pay

## 2023-04-16 ENCOUNTER — Other Ambulatory Visit: Payer: Self-pay

## 2023-04-17 ENCOUNTER — Ambulatory Visit (INDEPENDENT_AMBULATORY_CARE_PROVIDER_SITE_OTHER): Payer: Medicare Other

## 2023-04-17 DIAGNOSIS — E538 Deficiency of other specified B group vitamins: Secondary | ICD-10-CM | POA: Diagnosis not present

## 2023-04-17 MED ORDER — CYANOCOBALAMIN 1000 MCG/ML IJ SOLN
1000.0000 ug | Freq: Once | INTRAMUSCULAR | Status: AC
Start: 2023-04-17 — End: 2023-04-17
  Administered 2023-04-17: 1000 ug via INTRAMUSCULAR

## 2023-04-17 NOTE — Progress Notes (Signed)
Pt here for monthly B12 injection per [name]   B12 1000mcg given L deltoid IM, and pt tolerated injection well.   Next B12 injection scheduled for 1 month.  

## 2023-04-30 ENCOUNTER — Telehealth: Payer: Self-pay | Admitting: Family

## 2023-04-30 ENCOUNTER — Other Ambulatory Visit (HOSPITAL_BASED_OUTPATIENT_CLINIC_OR_DEPARTMENT_OTHER): Payer: Self-pay

## 2023-04-30 DIAGNOSIS — I10 Essential (primary) hypertension: Secondary | ICD-10-CM

## 2023-04-30 NOTE — Telephone Encounter (Signed)
Pt called & would like for [name] to clarify if she needs to have a urinalysis done since recently being prescribed losartan (COZAAR) 25 MG tablet. She stated her bp has been normal since taking the medications however, she has noticed her urine has slightly changed. Please call & advise pt or leave MyChart message to pt.

## 2023-05-01 NOTE — Telephone Encounter (Signed)
See mychart.  

## 2023-05-01 NOTE — Addendum Note (Signed)
Addended by: Sandford Craze on: 05/01/2023 02:26 PM   Modules accepted: Orders

## 2023-05-04 ENCOUNTER — Other Ambulatory Visit (HOSPITAL_BASED_OUTPATIENT_CLINIC_OR_DEPARTMENT_OTHER): Payer: Self-pay

## 2023-05-04 MED ORDER — LOSARTAN POTASSIUM 25 MG PO TABS
25.0000 mg | ORAL_TABLET | Freq: Every day | ORAL | 0 refills | Status: DC
  Filled 2023-05-04 – 2023-05-14 (×3): qty 90, 90d supply, fill #0

## 2023-05-04 NOTE — Addendum Note (Signed)
Addended by: Sandford Craze on: 05/04/2023 01:12 PM   Modules accepted: Orders

## 2023-05-05 ENCOUNTER — Other Ambulatory Visit (HOSPITAL_BASED_OUTPATIENT_CLINIC_OR_DEPARTMENT_OTHER): Payer: Self-pay

## 2023-05-07 DIAGNOSIS — Z85038 Personal history of other malignant neoplasm of large intestine: Secondary | ICD-10-CM | POA: Diagnosis not present

## 2023-05-07 DIAGNOSIS — Z980 Intestinal bypass and anastomosis status: Secondary | ICD-10-CM | POA: Diagnosis not present

## 2023-05-07 DIAGNOSIS — K648 Other hemorrhoids: Secondary | ICD-10-CM | POA: Diagnosis not present

## 2023-05-07 DIAGNOSIS — K635 Polyp of colon: Secondary | ICD-10-CM | POA: Diagnosis not present

## 2023-05-07 DIAGNOSIS — Z9889 Other specified postprocedural states: Secondary | ICD-10-CM | POA: Diagnosis not present

## 2023-05-07 DIAGNOSIS — D123 Benign neoplasm of transverse colon: Secondary | ICD-10-CM | POA: Diagnosis not present

## 2023-05-07 DIAGNOSIS — Z1211 Encounter for screening for malignant neoplasm of colon: Secondary | ICD-10-CM | POA: Diagnosis not present

## 2023-05-07 DIAGNOSIS — D120 Benign neoplasm of cecum: Secondary | ICD-10-CM | POA: Diagnosis not present

## 2023-05-11 ENCOUNTER — Other Ambulatory Visit (HOSPITAL_BASED_OUTPATIENT_CLINIC_OR_DEPARTMENT_OTHER): Payer: Self-pay

## 2023-05-14 ENCOUNTER — Other Ambulatory Visit (HOSPITAL_BASED_OUTPATIENT_CLINIC_OR_DEPARTMENT_OTHER): Payer: Self-pay

## 2023-05-22 ENCOUNTER — Ambulatory Visit: Payer: Medicare Other

## 2023-05-25 ENCOUNTER — Ambulatory Visit (INDEPENDENT_AMBULATORY_CARE_PROVIDER_SITE_OTHER): Payer: Medicare Other | Admitting: Neurology

## 2023-05-25 DIAGNOSIS — E538 Deficiency of other specified B group vitamins: Secondary | ICD-10-CM | POA: Diagnosis not present

## 2023-05-25 MED ORDER — CYANOCOBALAMIN 1000 MCG/ML IJ SOLN
1000.0000 ug | Freq: Once | INTRAMUSCULAR | Status: AC
Start: 2023-05-25 — End: 2023-05-25
  Administered 2023-05-25: 1000 ug via INTRAMUSCULAR

## 2023-05-25 NOTE — Progress Notes (Signed)
Sheryl [name] is a 76 yo here for monthly vitamin B12 injection per [name].  Original order: 07/15/22 "B12 and folate levels are good. Please continue your b12 injections. "  Denies gastrointestinal problems or dizziness.  B12 injection to right deltoid with no apparent complications.  Patient scheduled for visit with [name] in one month and will have B12 injection at that appointment.

## 2023-06-04 ENCOUNTER — Other Ambulatory Visit: Payer: Self-pay

## 2023-06-04 ENCOUNTER — Other Ambulatory Visit: Payer: Self-pay | Admitting: Family

## 2023-06-04 ENCOUNTER — Telehealth: Payer: Self-pay | Admitting: Family

## 2023-06-04 ENCOUNTER — Other Ambulatory Visit (HOSPITAL_BASED_OUTPATIENT_CLINIC_OR_DEPARTMENT_OTHER): Payer: Self-pay

## 2023-06-04 MED ORDER — TRELEGY ELLIPTA 200-62.5-25 MCG/ACT IN AEPB
1.0000 | INHALATION_SPRAY | Freq: Every day | RESPIRATORY_TRACT | 3 refills | Status: DC
  Filled 2023-06-04: qty 60, 30d supply, fill #0
  Filled 2023-06-21 – 2023-07-06 (×2): qty 60, 30d supply, fill #1
  Filled 2023-08-01: qty 60, 30d supply, fill #2
  Filled 2023-08-27: qty 60, 30d supply, fill #3

## 2023-06-04 NOTE — Telephone Encounter (Signed)
Rx sent 

## 2023-06-04 NOTE — Telephone Encounter (Signed)
Prescription Request  06/04/2023  Is this a "Controlled Substance" medicine? No  LOV: 02/16/2023 -- pt down to last 6 puffs  What is the name of the medication or equipment?   Fluticasone-Umeclidin-Vilant (TRELEGY ELLIPTA) 200-62.5-25 MCG/ACT AEPB  Have you contacted your pharmacy to request a refill? Yes   Which pharmacy would you like this sent to?  [facility] [facility] [address] [place] [address] Phone: [phone] Fax: [phone]     Patient notified that their request is being sent to the clinical staff for review and that they should receive a response within 2 business days.   Please advise at  [facility] [phone]

## 2023-06-19 ENCOUNTER — Ambulatory Visit (INDEPENDENT_AMBULATORY_CARE_PROVIDER_SITE_OTHER): Payer: Medicare Other | Admitting: Family

## 2023-06-19 ENCOUNTER — Other Ambulatory Visit (HOSPITAL_BASED_OUTPATIENT_CLINIC_OR_DEPARTMENT_OTHER): Payer: Self-pay

## 2023-06-19 VITALS — BP 139/66 | HR 75 | Temp 97.7°F | Resp 16 | Ht 62.0 in | Wt 183.0 lb

## 2023-06-19 DIAGNOSIS — E039 Hypothyroidism, unspecified: Secondary | ICD-10-CM

## 2023-06-19 DIAGNOSIS — Z23 Encounter for immunization: Secondary | ICD-10-CM | POA: Diagnosis not present

## 2023-06-19 DIAGNOSIS — E119 Type 2 diabetes mellitus without complications: Secondary | ICD-10-CM

## 2023-06-19 DIAGNOSIS — E559 Vitamin D deficiency, unspecified: Secondary | ICD-10-CM | POA: Diagnosis not present

## 2023-06-19 DIAGNOSIS — D582 Other hemoglobinopathies: Secondary | ICD-10-CM | POA: Diagnosis not present

## 2023-06-19 DIAGNOSIS — E782 Mixed hyperlipidemia: Secondary | ICD-10-CM

## 2023-06-19 DIAGNOSIS — Z2911 Encounter for prophylactic immunotherapy for respiratory syncytial virus (RSV): Secondary | ICD-10-CM | POA: Diagnosis not present

## 2023-06-19 DIAGNOSIS — E538 Deficiency of other specified B group vitamins: Secondary | ICD-10-CM | POA: Diagnosis not present

## 2023-06-19 DIAGNOSIS — I10 Essential (primary) hypertension: Secondary | ICD-10-CM

## 2023-06-19 DIAGNOSIS — J309 Allergic rhinitis, unspecified: Secondary | ICD-10-CM | POA: Diagnosis not present

## 2023-06-19 DIAGNOSIS — M069 Rheumatoid arthritis, unspecified: Secondary | ICD-10-CM

## 2023-06-19 LAB — CBC WITH DIFFERENTIAL/PLATELET
Basophils Absolute: 0.1 10*3/uL (ref 0.0–0.1)
Basophils Relative: 0.9 % (ref 0.0–3.0)
Eosinophils Absolute: 0.1 10*3/uL (ref 0.0–0.7)
Eosinophils Relative: 1.8 % (ref 0.0–5.0)
HCT: 44.1 % (ref 36.0–46.0)
Hemoglobin: 14.2 g/dL (ref 12.0–15.0)
Lymphocytes Relative: 26 % (ref 12.0–46.0)
Lymphs Abs: 1.7 10*3/uL (ref 0.7–4.0)
MCHC: 32.2 g/dL (ref 30.0–36.0)
MCV: 95.1 fL (ref 78.0–100.0)
Monocytes Absolute: 0.5 10*3/uL (ref 0.1–1.0)
Monocytes Relative: 8.2 % (ref 3.0–12.0)
Neutro Abs: 4 10*3/uL (ref 1.4–7.7)
Neutrophils Relative %: 63.1 % (ref 43.0–77.0)
Platelets: 151 10*3/uL (ref 150.0–400.0)
RBC: 4.64 Mil/uL (ref 3.87–5.11)
RDW: 14.2 % (ref 11.5–15.5)
WBC: 6.4 10*3/uL (ref 4.0–10.5)

## 2023-06-19 LAB — BASIC METABOLIC PANEL
BUN: 20 mg/dL (ref 6–23)
CO2: 26 meq/L (ref 19–32)
Calcium: 8.5 mg/dL (ref 8.4–10.5)
Chloride: 101 meq/L (ref 96–112)
Creatinine, Ser: 0.84 mg/dL (ref 0.40–1.20)
GFR: 67.69 mL/min (ref >60.00)
Glucose, Bld: 109 mg/dL — ABNORMAL HIGH (ref 70–99)
Potassium: 4.2 meq/L (ref 3.5–5.1)
Sodium: 136 meq/L (ref 135–145)

## 2023-06-19 LAB — HEMOGLOBIN A1C: Hgb A1c MFr Bld: 6 % (ref 4.6–6.5)

## 2023-06-19 LAB — VITAMIN D 25 HYDROXY (VIT D DEFICIENCY, FRACTURES): VITD: 86.03 ng/mL (ref 30.00–100.00)

## 2023-06-19 LAB — TSH: TSH: 2.56 u[IU]/mL (ref 0.35–5.50)

## 2023-06-19 LAB — VITAMIN B12: Vitamin B-12: 654 pg/mL (ref 211–911)

## 2023-06-19 MED ORDER — CYANOCOBALAMIN 1000 MCG/ML IJ SOLN
1000.0000 ug | Freq: Once | INTRAMUSCULAR | Status: AC
  Administered 2023-06-19: 1000 ug via INTRAMUSCULAR

## 2023-06-19 MED ORDER — AREXVY 120 MCG/0.5ML IM SUSR
0.5000 mL | Freq: Once | INTRAMUSCULAR | 0 refills | Status: AC
  Filled 2023-06-19: qty 0.5, 1d supply, fill #0

## 2023-06-19 MED ORDER — COMIRNATY 30 MCG/0.3ML IM SUSP
0.3000 mL | Freq: Once | INTRAMUSCULAR | 0 refills | Status: AC
  Filled 2023-06-19: qty 0.3, 1d supply, fill #0

## 2023-06-19 NOTE — Progress Notes (Signed)
Subjective:     Patient ID: Sheryl [name], female    DOB: 1946-07-15, 77 y.o.   MRN: [id number]  Chief Complaint  Patient presents with   Diabetes    Here for follow up   Hypertension    Here for follow up    HPI  Discussed the use of AI scribe software for clinical note transcription with the patient, who gave verbal consent to proceed.  History of Present Illness   The patient, with a history of hypertension, presents for a follow-up visit regarding her blood pressure medication (losartan). The patient reports that her blood pressure has significantly improved, with readings ranging from 113 to 120 over 60 to 70.  The patient also reports severe ankle swelling that has limited her mobility and led to weight gain. She has been diagnosed with stretched ligaments in both ankles, likely due to arthritis. The patient is managing the condition with braces and compression stockings, which have alleviated the pain. However, she reports difficulty with balance and is unable to stand for extended periods.  The patient also reports chronic congestion, which she believes started three years ago after a visit to a friend's house where a baby had RSV. The patient manages the congestion with Trellegy, a rescue inhaler, Flonase, Singulair, and Zyrtec. The patient also mentions a previous history of smoking but does not believe the congestion is related to this.  The patient is currently receiving monthly B12 injections and is due for a flu shot. She also mentions a history of dental issues, including bone loss and fallen implants. The patient is currently on a regimen of vitamins recommended by her dental surgeon to address the bone loss.     Lab Results  Component Value Date   WBC 9.2 09/10/2021   HGB 15.4 (H) 09/10/2021   HCT 46.4 (H) 09/10/2021   MCV 93.1 09/10/2021   PLT 180.0 09/10/2021         Health Maintenance Due  Topic Date Due   FOOT EXAM  01/14/2023   OPHTHALMOLOGY  EXAM  01/17/2023   DTaP/Tdap/Td (3 - Td or Tdap) 02/15/2023   COVID-19 Vaccine (6 - 2023-24 season) 04/26/2023   Medicare Annual Wellness (AWV)  08/09/2023    Past Medical History:  Diagnosis Date   Anemia    pernicious   Aortic ectasia (HCC)    infrarenal abdomina aorta 2.6 cm   Asthma    Calculus of gallbladder 09/13/2018   Colon cancer (HCC) 2012   Diabetes mellitus    type 2   Fatty liver 01/19/2015   Fatty liver    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   DILATION AND CURETTAGE OF UTERUS  1975   HERNIA REPAIR  10/23/2016   KNEE SURGERY  2006   arthroscopic left knee   KNEE SURGERY  2001   right knee    Family History  Problem Relation Age of Onset  Heart disease Other        CAD   Diabetes Other    Hyperlipidemia Other    Stroke Other     Social History   Socioeconomic History   Marital status: Single    Spouse name: Not on file   Number of children: 1   Years of education: Not on file   Highest education level: Associate degree: academic program  Occupational History   Occupation: unemployed    Employer: DISABLED  Tobacco Use   Smoking status: Former    Current packs/day: 0.00    Types: Cigarettes    Quit date: 04/25/2021    Years since quitting: 2.1   Smokeless tobacco: Never   Tobacco comments:    smokes occasionally but not everyday  Substance and Sexual Activity   Alcohol use: Yes    Alcohol/week: 1.0 standard  drink of alcohol    Types: 1 Glasses of wine per week    Comment: occasionally   Drug use: Yes    Comment: Smokes "medical grade" marijuana 2 puffs 3 x weekly for rheumatoid arthritis.per pt   Sexual activity: Never  Other Topics Concern   Not on file  Social History Narrative   Regular exercise:  Stretching exercises. Resistance bands   Caffeine: 1 mug (2cus) daily.      Social Determinants of Health   Financial Resource Strain: Low Risk  (06/18/2023)   Overall Financial Resource Strain (CARDIA)    Difficulty of Paying Living Expenses: Not hard at all  Food Insecurity: No Food Insecurity (06/18/2023)   Hunger Vital Sign    Worried About Running Out of Food in the Last Year: Never true    Ran Out of Food in the Last Year: Never true  Transportation Needs: No Transportation Needs (06/18/2023)   PRAPARE - Administrator, Civil Service (Medical): No    Lack of Transportation (Non-Medical): No  Physical Activity: Unknown (06/18/2023)   Exercise Vital Sign    Days of Exercise per Week: 0 days    Minutes of Exercise per Session: Not on file  Stress: No Stress Concern Present (06/18/2023)   [employer] of Occupational Health - Occupational Stress Questionnaire    Feeling of Stress : Only a little  Social Connections: Moderately Isolated (06/18/2023)   Social Connection and Isolation Panel [NHANES]    Frequency of Communication with Friends and Family: More than three times a week    Frequency of Social Gatherings with Friends and Family: Once a week    Attends Religious Services: Never    Database administrator or Organizations: Yes    Attends Engineer, structural: More than 4 times per year    Marital Status: Divorced  Intimate Partner Violence: Not At Risk (08/08/2022)   Humiliation, Afraid, Rape, and Kick questionnaire    Fear of Current or Ex-Partner: No    Emotionally Abused: No    Physically Abused: No    Sexually Abused: No    Outpatient  Medications Prior to Visit  Medication Sig Dispense Refill   albuterol (VENTOLIN HFA) 108 (90 Base) MCG/ACT inhaler Inhale 2 puffs into the lungs every 6 (six) hours as needed for wheezing or shortness of breath. 54 g 1   amLODipine (NORVASC) 5 MG tablet Take 1 tablet (5 mg total) by mouth daily. 90 tablet 1   aspirin 81 MG tablet Take 81 mg by mouth daily.     clobetasol cream (TEMOVATE) 0.05 % Apply to affected areas  2 times a day for 2 weeks on, and 1 week off. Repeat cycle as needed. 60 g 0   Fluticasone-Umeclidin-Vilant (TRELEGY ELLIPTA) 200-62.5-25 MCG/ACT AEPB Inhale 1 puff into the lungs daily. 60 each 3   ibuprofen (ADVIL) 600 MG tablet Take 1 tablet (600 mg total) by mouth every 6-8 hours as needed for pain and swelling. 20 tablet 0   levothyroxine (SYNTHROID) 100 MCG tablet Take 1 tablet (100 mcg total) by mouth daily. 90 tablet 1   losartan (COZAAR) 25 MG tablet Take 1 tablet (25 mg total) by mouth daily. 90 tablet 0   montelukast (SINGULAIR) 10 MG tablet Take 1 tablet (10 mg total) by mouth at bedtime. 90 tablet 0   multivitamin (THERAGRAN) per tablet Take 1 tablet by mouth daily.     nirmatrelvir & ritonavir (PAXLOVID, 300/100,) 20 x 150 MG & 10 x 100MG  TBPK Take per package instructions. 30 tablet 0   NON FORMULARY TUMERIC MILK.     NON FORMULARY CBD. Derivative of marijuana without the marijuana.     pantoprazole (PROTONIX) 40 MG tablet Take 1 tablet (40 mg total) by mouth daily. 90 tablet 3   Probiotic Product (PROBIOTIC DAILY PO) Take 1 capsule by mouth daily.     VOLTAREN 1 % GEL APPLY 2 GRAMS TOPICALLY 4 TIMES AS NEEDED 100 g 2   No facility-administered medications prior to visit.    Allergies  Allergen Reactions   Levofloxacin Shortness Of Breath and Rash   Mold Extract [Trichophyton] Anaphylaxis   Ativan [Lorazepam]     Visual hallucinations.  Visual hallucinations.    Cefuroxime Axetil     REACTION: asthma, cough   Hydrocodone Bit-Homatrop Mbr Nausea Only    Lisinopril     Diarrhea hives   Rosuvastatin Calcium     Muscle pain, syncope   Statins     Muscle pain, syncope   Zetia [Ezetimibe] Diarrhea   Hydrocodone-Acetaminophen Nausea Only    ROS See HPI    Objective:    Physical Exam Constitutional:      General: She is not in acute distress.    Appearance: Normal appearance. She is well-developed.  HENT:     Head: Normocephalic and atraumatic.     Right Ear: External ear normal.     Left Ear: External ear normal.  Eyes:     General: No scleral icterus. Neck:     Thyroid: No thyromegaly.  Cardiovascular:     Rate and Rhythm: Normal rate and regular rhythm.     Heart sounds: Normal heart sounds. No murmur heard. Pulmonary:     Effort: Pulmonary effort is normal. No respiratory distress.     Breath sounds: Normal breath sounds. No wheezing.  Musculoskeletal:        General: Swelling (2+ bilateral LE edema) present.     Cervical back: Neck supple.  Skin:    General: Skin is warm and dry.  Neurological:     Mental Status: She is alert and oriented to person, place, and time.  Psychiatric:        Mood and Affect: Mood normal.        Behavior: Behavior normal.        Thought Content: Thought content normal.        Judgment: Judgment normal.      BP 139/66 (BP Location: Right Arm, Patient Position: Sitting, Cuff Size: Normal)   Pulse 75   Temp 97.7 F (36.5 C) (Oral)   Resp 16  Ht 5\' 2"  (1.575 m)   Wt 183 lb (83 kg)   SpO2 100%   BMI 33.47 kg/m  Wt Readings from Last 3 Encounters:  06/19/23 183 lb (83 kg)  02/16/23 180 lb (81.6 kg)  10/17/22 185 lb (83.9 kg)       Assessment & Plan:   Problem List Items Addressed This Visit       Unprioritized   Vitamin D deficiency - Primary   Relevant Orders   VITAMIN D 25 Hydroxy (Vit-D Deficiency, Fractures)   Rheumatoid arthritis (HCC)    Flares on occasion- has declined therapy with Rheumatology.       Mixed dyslipidemia    Lab Results  Component Value Date    CHOL 200 10/17/2022   HDL 31.90 (L) 10/17/2022   LDLCALC 134 (H) 10/17/2022   LDLDIRECT 129.0 12/04/2017   TRIG 171.0 (H) 10/17/2022   CHOLHDL 6 10/17/2022   Fair lipid panel- continue dietary modification efforts.        Hypothyroidism    Lab Results  Component Value Date   TSH 1.52 02/16/2023   Clinically stable on synthroid. Update tsh.       Relevant Orders   TSH   Hypertension    BP Readings from Last 3 Encounters:  06/19/23 139/66  02/16/23 138/63  10/17/22 (!) 159/81   BP stable on losartan and amlodipine. Update renal function.      Relevant Orders   Basic Metabolic Panel (BMET)   Diabetes type 2, controlled (HCC)    Lab Results  Component Value Date   HGBA1C 6.0 02/16/2023   HGBA1C 6.3 10/17/2022   HGBA1C 6.2 01/13/2022   Lab Results  Component Value Date   MICROALBUR 6.1 (H) 02/16/2023   LDLCALC 134 (H) 10/17/2022   CREATININE 0.94 02/16/2023   Diet controlled.       Relevant Orders   HgB A1c   B12 deficiency    Continues b12 injections monthly.       Relevant Orders   B12   Allergic rhinitis    Fair control on zyrtec, flonase and singulair. Declines referral to allergist.        Other Visit Diagnoses     Need for RSV vaccination       Relevant Medications   RSV vaccine recomb adjuvanted (AREXVY) 120 MCG/0.5ML injection   Need for COVID-19 vaccine       Relevant Medications   COVID-19 mRNA Vac-TriS, Pfizer, (COMIRNATY) SUSP injection   Elevated hemoglobin (HCC)       Relevant Orders   Iron, TIBC and Ferritin Panel   CBC w/Diff   Hemochromatosis DNA-PCR(c282y,h63d)   Need for influenza vaccination       Relevant Orders   Flu Vaccine Trivalent High Dose (Fluad) (Completed)       I am having Wayne L. Deans start on Comirnaty and Arexvy. I am also having her maintain her aspirin, multivitamin, NON FORMULARY, Probiotic Product (PROBIOTIC DAILY PO), NON FORMULARY, Voltaren, albuterol, ibuprofen, pantoprazole, clobetasol  cream, levothyroxine, amLODipine, Paxlovid (300/100), montelukast, losartan, and Trelegy Ellipta. We administered cyanocobalamin.  Meds ordered this encounter  Medications   COVID-19 mRNA Vac-TriS, Pfizer, (COMIRNATY) SUSP injection    Sig: Inject 0.3 mLs into the muscle once for 1 dose.    Dispense:  0.3 mL    Refill:  0    Order Specific Question:   Supervising Provider    Answer:   Danise Edge A [[id number]]   RSV vaccine recomb adjuvanted (AREXVY) 120  MCG/0.5ML injection    Sig: Inject 0.5 mLs into the muscle once for 1 dose.    Dispense:  0.5 mL    Refill:  0    Order Specific Question:   Supervising Provider    Answer:   Danise Edge A [[id number]]   cyanocobalamin (VITAMIN B12) injection 1,000 mcg

## 2023-06-19 NOTE — Assessment & Plan Note (Signed)
Lab Results  Component Value Date   TSH 1.52 02/16/2023   Clinically stable on synthroid. Update tsh.

## 2023-06-19 NOTE — Assessment & Plan Note (Signed)
Lab Results  Component Value Date   HGBA1C 6.0 02/16/2023   HGBA1C 6.3 10/17/2022   HGBA1C 6.2 01/13/2022   Lab Results  Component Value Date   MICROALBUR 6.1 (H) 02/16/2023   LDLCALC 134 (H) 10/17/2022   CREATININE 0.94 02/16/2023   Diet controlled.

## 2023-06-19 NOTE — Assessment & Plan Note (Signed)
Fair control on zyrtec, flonase and singulair. Declines referral to allergist.

## 2023-06-19 NOTE — Patient Instructions (Signed)
VISIT SUMMARY:  During today's visit, we discussed your current health concerns, including your blood pressure, ankle pain, chronic congestion, and general health maintenance. We reviewed your medications and made plans for further management and follow-up tests.  YOUR PLAN:  -HYPERTENSION: Hypertension, or high blood pressure, is being well-controlled with your current medication. You should continue taking your medication as prescribed.  -ANKLE ARTHRITIS: Arthritis in your ankles is causing pain and swelling, which affects your mobility. Continue using braces and compression stockings as part of your management plan.  -HYPERLIPIDEMIA: Hyperlipidemia, or high cholesterol, is currently at acceptable levels. Continue with your healthy diet, and no medication is needed at this time.  -CHRONIC CONGESTION: Your chronic congestion is likely due to allergies and asthma. You are managing it with Trelegy, a rescue inhaler, Flonase, Singulair, and Zyrtec. If symptoms worsen, we may refer you to an allergist for potential allergy shots.  -VITAMIN D DEFICIENCY: Vitamin D deficiency can lead to bone loss. We will check your Vitamin D levels today to ensure they are adequate.  -GENERAL HEALTH MAINTENANCE: We will administer a B12 shot and a flu shot today. We will also check your thyroid function, screen for hemochromatosis due to your family history, and order labs to check your kidney function. Additionally, consider scheduling an eye exam due to changes in your vision.  INSTRUCTIONS:  Please continue with your current medications and management plans. We will check your Vitamin D levels, thyroid function, and kidney function today. You will also receive a B12 shot and a flu shot. Consider scheduling an eye exam. Follow up with [place] if your symptoms worsen or if you have any new concerns.

## 2023-06-19 NOTE — Assessment & Plan Note (Signed)
Flares on occasion- has declined therapy with Rheumatology.

## 2023-06-19 NOTE — Assessment & Plan Note (Signed)
Lab Results  Component Value Date   CHOL 200 10/17/2022   HDL 31.90 (L) 10/17/2022   LDLCALC 134 (H) 10/17/2022   LDLDIRECT 129.0 12/04/2017   TRIG 171.0 (H) 10/17/2022   CHOLHDL 6 10/17/2022   Fair lipid panel- continue dietary modification efforts.

## 2023-06-19 NOTE — Assessment & Plan Note (Addendum)
BP Readings from Last 3 Encounters:  06/19/23 139/66  02/16/23 138/63  10/17/22 (!) 159/81   BP stable on losartan and amlodipine. Update renal function.

## 2023-06-19 NOTE — Assessment & Plan Note (Signed)
Continues b12 injections monthly.

## 2023-06-20 LAB — IRON,TIBC AND FERRITIN PANEL
%SAT: 24 % (ref 16–45)
Ferritin: 85 ng/mL (ref 16–288)
Iron: 74 ug/dL (ref 45–160)
TIBC: 304 ug/dL (ref 250–450)

## 2023-06-21 ENCOUNTER — Other Ambulatory Visit: Payer: Self-pay | Admitting: Family

## 2023-06-21 MED ORDER — MONTELUKAST SODIUM 10 MG PO TABS
10.0000 mg | ORAL_TABLET | Freq: Every day | ORAL | 1 refills | Status: DC
  Filled 2023-06-21 – 2023-08-01 (×2): qty 90, 90d supply, fill #0
  Filled 2023-11-17: qty 90, 90d supply, fill #1

## 2023-06-22 ENCOUNTER — Other Ambulatory Visit (HOSPITAL_BASED_OUTPATIENT_CLINIC_OR_DEPARTMENT_OTHER): Payer: Self-pay

## 2023-07-01 LAB — HEMOCHROMATOSIS DNA-PCR(C282Y,H63D)

## 2023-07-06 ENCOUNTER — Other Ambulatory Visit (HOSPITAL_BASED_OUTPATIENT_CLINIC_OR_DEPARTMENT_OTHER): Payer: Self-pay

## 2023-07-31 ENCOUNTER — Telehealth: Payer: Self-pay | Admitting: Family

## 2023-07-31 NOTE — Telephone Encounter (Signed)
Copied from CRM [id number]. Topic: Medicare AWV >> Jul 31, 2023 10:35 AM Payton Doughty wrote: Reason for CRM: Called LVM 07/31/2023 to schedule AWV TELEHEALTH ONLY  Verlee Rossetti; Care Guide Ambulatory Clinical Support [place] [facility] Direct Dial: [phone]

## 2023-08-01 ENCOUNTER — Other Ambulatory Visit: Payer: Self-pay | Admitting: Family

## 2023-08-03 ENCOUNTER — Other Ambulatory Visit (HOSPITAL_BASED_OUTPATIENT_CLINIC_OR_DEPARTMENT_OTHER): Payer: Self-pay

## 2023-08-03 MED ORDER — LOSARTAN POTASSIUM 25 MG PO TABS
25.0000 mg | ORAL_TABLET | Freq: Every day | ORAL | 0 refills | Status: DC
  Filled 2023-08-03: qty 90, 90d supply, fill #0

## 2023-08-12 ENCOUNTER — Ambulatory Visit (INDEPENDENT_AMBULATORY_CARE_PROVIDER_SITE_OTHER): Payer: Medicare Other

## 2023-08-12 ENCOUNTER — Other Ambulatory Visit (HOSPITAL_BASED_OUTPATIENT_CLINIC_OR_DEPARTMENT_OTHER): Payer: Self-pay

## 2023-08-12 DIAGNOSIS — E538 Deficiency of other specified B group vitamins: Secondary | ICD-10-CM

## 2023-08-12 DIAGNOSIS — Z23 Encounter for immunization: Secondary | ICD-10-CM | POA: Diagnosis not present

## 2023-08-12 MED ORDER — COVID-19 MRNA VAC-TRIS(PFIZER) 30 MCG/0.3ML IM SUSY
0.3000 mL | PREFILLED_SYRINGE | Freq: Once | INTRAMUSCULAR | 0 refills | Status: AC
  Filled 2023-08-12: qty 0.3, 1d supply, fill #0

## 2023-08-12 MED ORDER — RSVPREF3 VAC RECOMB ADJUVANTED 120 MCG/0.5ML IM SUSR
0.5000 mL | Freq: Once | INTRAMUSCULAR | 0 refills | Status: AC
Start: 2023-08-12 — End: 2023-08-13
  Filled 2023-08-12: qty 0.5, 1d supply, fill #0

## 2023-08-12 MED ORDER — CYANOCOBALAMIN 1000 MCG/ML IJ SOLN
1000.0000 ug | Freq: Once | INTRAMUSCULAR | Status: AC
  Administered 2023-08-12: 1000 ug via INTRAMUSCULAR

## 2023-08-12 NOTE — Progress Notes (Signed)
Pt here for monthly B12 injection per  [name]  B12 given IM, and pt tolerated injection well.  Next B12 injection scheduled for 09/10/23

## 2023-08-27 ENCOUNTER — Other Ambulatory Visit (HOSPITAL_BASED_OUTPATIENT_CLINIC_OR_DEPARTMENT_OTHER): Payer: Self-pay

## 2023-08-27 ENCOUNTER — Other Ambulatory Visit: Payer: Self-pay | Admitting: Family

## 2023-08-27 MED ORDER — LEVOTHYROXINE SODIUM 100 MCG PO TABS
100.0000 ug | ORAL_TABLET | Freq: Every day | ORAL | 1 refills | Status: DC
  Filled 2023-08-27: qty 90, 90d supply, fill #0
  Filled 2023-11-17: qty 90, 90d supply, fill #1

## 2023-08-27 MED ORDER — LOSARTAN POTASSIUM 25 MG PO TABS
25.0000 mg | ORAL_TABLET | Freq: Every day | ORAL | 0 refills | Status: DC
  Filled 2023-08-27 – 2023-11-17 (×2): qty 90, 90d supply, fill #0

## 2023-08-27 MED ORDER — AMLODIPINE BESYLATE 5 MG PO TABS
5.0000 mg | ORAL_TABLET | Freq: Every day | ORAL | 0 refills | Status: DC
  Filled 2023-08-27 – 2023-09-30 (×2): qty 90, 90d supply, fill #0

## 2023-09-10 ENCOUNTER — Ambulatory Visit (INDEPENDENT_AMBULATORY_CARE_PROVIDER_SITE_OTHER): Payer: Medicare Other | Admitting: Emergency Medicine

## 2023-09-10 DIAGNOSIS — E538 Deficiency of other specified B group vitamins: Secondary | ICD-10-CM

## 2023-09-10 MED ORDER — CYANOCOBALAMIN 1000 MCG/ML IJ SOLN
1000.0000 ug | Freq: Once | INTRAMUSCULAR | Status: AC
  Administered 2023-09-10: 1000 ug via INTRAMUSCULAR

## 2023-09-10 NOTE — Progress Notes (Signed)
Patient here for monthly b12 injection per physicians order.  Injection given in left deltoid and patient tolerated well.  

## 2023-09-30 ENCOUNTER — Other Ambulatory Visit: Payer: Self-pay

## 2023-09-30 ENCOUNTER — Other Ambulatory Visit (HOSPITAL_BASED_OUTPATIENT_CLINIC_OR_DEPARTMENT_OTHER): Payer: Self-pay

## 2023-09-30 ENCOUNTER — Other Ambulatory Visit: Payer: Self-pay | Admitting: Family

## 2023-09-30 MED ORDER — TRELEGY ELLIPTA 200-62.5-25 MCG/ACT IN AEPB
1.0000 | INHALATION_SPRAY | Freq: Every day | RESPIRATORY_TRACT | 5 refills | Status: DC
  Filled 2023-09-30: qty 60, 30d supply, fill #0
  Filled 2023-11-02: qty 60, 30d supply, fill #1
  Filled 2023-11-17 – 2023-11-29 (×2): qty 60, 30d supply, fill #2
  Filled 2023-12-29: qty 60, 30d supply, fill #3

## 2023-09-30 MED ORDER — ALBUTEROL SULFATE HFA 108 (90 BASE) MCG/ACT IN AERS
2.0000 | INHALATION_SPRAY | Freq: Four times a day (QID) | RESPIRATORY_TRACT | 5 refills | Status: AC | PRN
  Filled 2023-09-30: qty 6.7, 20d supply, fill #0
  Filled 2023-12-29: qty 6.7, 20d supply, fill #1
  Filled 2024-06-08: qty 6.7, 20d supply, fill #2
  Filled 2024-06-28: qty 6.7, 20d supply, fill #3
  Filled 2024-08-07: qty 6.7, 20d supply, fill #4
  Filled 2024-09-27: qty 6.7, 20d supply, fill #5

## 2023-10-03 ENCOUNTER — Other Ambulatory Visit: Payer: Self-pay

## 2023-10-03 ENCOUNTER — Emergency Department (HOSPITAL_BASED_OUTPATIENT_CLINIC_OR_DEPARTMENT_OTHER)
Admission: EM | Admit: 2023-10-03 | Discharge: 2023-10-03 | Disposition: A | Discharge status: Home / Self Care | Payer: Medicare Other | Attending: Emergency Medicine | Admitting: Emergency Medicine

## 2023-10-03 ENCOUNTER — Encounter (HOSPITAL_BASED_OUTPATIENT_CLINIC_OR_DEPARTMENT_OTHER): Payer: Self-pay | Admitting: *Deleted

## 2023-10-03 ENCOUNTER — Emergency Department (HOSPITAL_BASED_OUTPATIENT_CLINIC_OR_DEPARTMENT_OTHER): Payer: Medicare Other

## 2023-10-03 DIAGNOSIS — X501XXA Overexertion from prolonged static or awkward postures, initial encounter: Secondary | ICD-10-CM | POA: Insufficient documentation

## 2023-10-03 DIAGNOSIS — S93401A Sprain of unspecified ligament of right ankle, initial encounter: Secondary | ICD-10-CM | POA: Diagnosis not present

## 2023-10-03 DIAGNOSIS — Z7982 Long term (current) use of aspirin: Secondary | ICD-10-CM | POA: Diagnosis not present

## 2023-10-03 DIAGNOSIS — S99911A Unspecified injury of right ankle, initial encounter: Secondary | ICD-10-CM | POA: Diagnosis present

## 2023-10-03 DIAGNOSIS — Y92019 Unspecified place in single-family (private) house as the place of occurrence of the external cause: Secondary | ICD-10-CM | POA: Diagnosis not present

## 2023-10-03 DIAGNOSIS — M1711 Unilateral primary osteoarthritis, right knee: Secondary | ICD-10-CM | POA: Diagnosis not present

## 2023-10-03 DIAGNOSIS — M25561 Pain in right knee: Secondary | ICD-10-CM | POA: Diagnosis not present

## 2023-10-03 DIAGNOSIS — M25571 Pain in right ankle and joints of right foot: Secondary | ICD-10-CM | POA: Diagnosis not present

## 2023-10-03 NOTE — ED Provider Notes (Signed)
 [facility] Provider Note   CSN: [id number] Arrival date & time: 10/03/23  1751     History Chief Complaint  Patient presents with   [name]    Sheryl [name] is a 78 y.o. female.  Patient presents the emergency department concerns of ankle pain.  She reports that she had rolled her ankle while at home.  States that the right ankle folic it gave out and she rolled the ankle inwards.  States that she is able to bear weight on the ankle and foot but some difficulty with ambulation due to pain.  Also noticing some pain in the right knee but denies any direct impact to the area.  Tried icing the ankle and side ibuprofen  at home with some minimal improvement in symptoms.  She does have a history of chronic swelling in the lower extremities.   Fall       Home Medications Prior to Admission medications   Medication Sig Start Date End Date Taking? Authorizing Provider  albuterol  (VENTOLIN  HFA) 108 (90 Base) MCG/ACT inhaler Inhale 2 puffs into the lungs every 6 (six) hours as needed for wheezing or shortness of breath. 09/30/23   O'Sullivan, [name], NP  amLODipine  (NORVASC ) 5 MG tablet Take 1 tablet (5 mg total) by mouth daily. 08/27/23   O'Sullivan, [name], NP  aspirin  81 MG tablet Take 81 mg by mouth daily.    [provider]  clobetasol  cream (TEMOVATE ) 0.05 % Apply to affected areas 2 times a day for 2 weeks on, and 1 week off. Repeat cycle as needed. 10/17/22   O'Sullivan, [name], NP  Fluticasone -Umeclidin-Vilant (TRELEGY ELLIPTA ) 200-62.5-25 MCG/ACT AEPB Inhale 1 puff into the lungs daily. 09/30/23   O'Sullivan, [name], NP  ibuprofen  (ADVIL ) 600 MG tablet Take 1 tablet (600 mg total) by mouth every 6-8 hours as needed for pain and swelling. 07/07/22     levothyroxine  (SYNTHROID ) 100 MCG tablet Take 1 tablet (100 mcg total) by mouth daily before breakfast. 08/27/23   O'Sullivan, [name], NP  losartan  (COZAAR ) 25 MG tablet Take 1 tablet  (25 mg total) by mouth daily. 08/27/23   O'Sullivan, [name], NP  montelukast  (SINGULAIR ) 10 MG tablet Take 1 tablet (10 mg total) by mouth at bedtime. 06/21/23   O'Sullivan, [name], NP  multivitamin (THERAGRAN) per tablet Take 1 tablet by mouth daily.    [provider]  nirmatrelvir  & ritonavir  (PAXLOVID , 300/100,) 20 x 150 MG & 10 x 100MG  TBPK Take per package instructions. 02/17/23   Daryl Setter, NP  NON FORMULARY TUMERIC MILK.    [provider]  NON FORMULARY CBD. Derivative of marijuana without the marijuana.    [provider]  pantoprazole  (PROTONIX ) 40 MG tablet Take 1 tablet (40 mg total) by mouth daily. 09/01/22   O'Sullivan, [name], NP  Probiotic Product (PROBIOTIC DAILY PO) Take 1 capsule by mouth daily.    [provider]  VOLTAREN  1 % GEL APPLY 2 GRAMS TOPICALLY 4 TIMES AS NEEDED 12/09/17   Daryl Setter, NP  lisinopril  (ZESTRIL ) 10 MG tablet Take 1 tablet (10 mg total) by mouth daily. 08/06/20 08/14/20  O'Sullivan, [name], NP      Allergies    Levofloxacin, Mold extract [trichophyton], Ativan [lorazepam], Cefuroxime axetil, Hydrocodone  bit-homatrop mbr, Lisinopril , Rosuvastatin calcium, Statins, Zetia  [ezetimibe ], and Hydrocodone -acetaminophen    Review of Systems   Review of Systems  Musculoskeletal:  Positive for joint swelling.  All other systems reviewed and are negative.   Physical  Exam Updated Vital Signs BP 121/66 (BP Location: Left Arm)   Pulse 81   Temp 98 F (36.7 C) (Oral)   Resp 18   SpO2 94%  Physical Exam Vitals and nursing note reviewed.  Constitutional:      General: She is not in acute distress.    Appearance: She is well-developed.  HENT:     Head: Normocephalic and atraumatic.  Eyes:     Conjunctiva/sclera: Conjunctivae normal.  Cardiovascular:     Rate and Rhythm: Normal rate and regular rhythm.     Heart sounds: No murmur heard. Pulmonary:     Effort: Pulmonary effort is normal. No  respiratory distress.     Breath sounds: Normal breath sounds.  Abdominal:     Palpations: Abdomen is soft.     Tenderness: There is no abdominal tenderness.  Musculoskeletal:        General: Swelling and tenderness present. No deformity or signs of injury. Normal range of motion.     Cervical back: Neck supple.       Legs:     Comments: TTP along the lateral malleolus and surrounding areas, but primarily inferior and anterior to the lateral malleolus. Medial malleolus unremarkable. Passive ROM normal. Active ROM limited due to pain.  Skin:    General: Skin is warm and dry.     Capillary Refill: Capillary refill takes less than 2 seconds.  Neurological:     Mental Status: She is alert.  Psychiatric:        Mood and Affect: Mood normal.     ED Results / Procedures / Treatments   Labs (all labs ordered are listed, but only abnormal results are displayed) Labs Reviewed - No data to display  EKG None  Radiology DG Ankle Complete Right Result Date: 10/03/2023 CLINICAL DATA:  Fall, ankle pain EXAM: RIGHT ANKLE - COMPLETE 3+ VIEW COMPARISON:  None Available. FINDINGS: No acute bony abnormality. Specifically, no fracture, subluxation, or dislocation. Mild spurring in the ankle joint. Joint space maintained. Soft tissues are intact with mild diffuse soft tissue swelling. IMPRESSION: No acute bony abnormality. Electronically Signed   By: Franky Crease M.D.   On: 10/03/2023 19:24   DG Knee Complete 4 Views Right Result Date: 10/03/2023 CLINICAL DATA:  Fall, knee pain EXAM: RIGHT KNEE - COMPLETE 4+ VIEW COMPARISON:  None Available. FINDINGS: Degenerative changes in the medial and patellofemoral compartments with joint space narrowing and spurring. No acute bony abnormality. Specifically, no fracture, subluxation, or dislocation. No joint effusion. IMPRESSION: Moderate degenerative changes.  No acute bony abnormality. Electronically Signed   By: Franky Crease M.D.   On: 10/03/2023 19:23     Procedures Procedures    Medications Ordered in ED Medications - No data to display  ED Course/ Medical Decision Making/ A&P Clinical Course as of 10/03/23 2253  Sat Oct 03, 2023  2251 DG Ankle Complete Right [OZ]    Clinical Course User Index [OZ] Sheryl Legrand LABOR, Sheryl [name]                                 Medical Decision Making Amount and/or Complexity of Data Reviewed Radiology: ordered.    This patient presents to the ED for concern of ankle pain. Differential diagnosis includes ankle sprain, malleolar fracture, tibial fracture, ankle dislocation, ankle swelling    Imaging Studies ordered:  I ordered imaging studies including x-ray of the right ankle and right knee  I independently visualized and interpreted imaging which showed no acute bony abnormalities on ankle x-ray or knee x-ray, evidence of arthritis in right knee I agree with the radiologist interpretation   Problem List / ED Course:  Patient presents the emergency department concerns of ankle pain.  She reports that she had a reorder of the right ankle while she was ambulating in her home.  She states that she is able to bear weight on the ankle but somewhat difficult to walk due to pain.  Tried icing the ankle taken and Profen at home without improvement in symptoms.  Denies any feelings of tingling, weakness, or numbness. On exam, there is some notable swelling primarily to the lateral malleolus of the right ankle.  No significant bruising seen.  Passive range of motion intact unremarkable.  Active range of motion some limited due to pain.  Neurovascular intact.  Given reassuring x-ray imaging, I do suspect this is an ankle sprain that should be managed with an ankle brace.  ASO lace up brace ordered for support.  Given patient's age, do not feel that crutches will be beneficial and instead encourage patient to use a walker.  She reports that her friend that brought her and has a walker that available to her and she  will plan on using this before reaching out to her primary care provider on Monday for an order for a walker.  Advised patient that she is having failure of improvement in symptoms, she should be evaluated by orthopedics for further assessment.  Return precautions discussed.  Patient otherwise stable and discharged home.   Social Determinants of Health:  Patient still lives independently at home  Final Clinical Impression(s) / ED Diagnoses Final diagnoses:  Sprain of right ankle, unspecified ligament, initial encounter    Rx / DC Orders ED Discharge Orders     None         Sheryl [name] 10/03/23 2253    Nicholaus Cassondra DEL, MD 10/04/23 1459

## 2023-10-03 NOTE — Discharge Instructions (Signed)
 You are seen in the emergency department today for concerns of a fall.  Your x-rays of the right ankle and the right knee were thankfully reassuring although there are some clear signs of arthritis that are present there.  There is no sign of any fracture or dislocation.  I do suspect this is all due to an ankle sprain from the rolling her ankle.  You are provided with an ankle brace for added support.  Given that I would not recommend using crutches in your situation, I would suggest using a walker.  You may use her friend's walker but I would reach out to your primary care provider to have a walker prescribed for you.  Unfortunately do not have a way of getting you a walker from the emergency department.  For pain, you may take Tylenol and ibuprofen .  If any new or worsening symptoms arise, return to the emergency department.  Otherwise, please follow-up with your primary care provider.

## 2023-10-03 NOTE — ED Triage Notes (Signed)
 Pt fell pta.  She states that she thinks her right ankle gave out.  She reports that she is concerned about her right ankle. Pt has pain in right knee also from fall. Pt states she iced it at home and took ibuprofen .

## 2023-10-13 ENCOUNTER — Ambulatory Visit: Payer: Medicare Other | Admitting: Family

## 2023-10-13 ENCOUNTER — Ambulatory Visit: Payer: Medicare Other

## 2023-10-13 ENCOUNTER — Ambulatory Visit (INDEPENDENT_AMBULATORY_CARE_PROVIDER_SITE_OTHER): Payer: Medicare Other | Admitting: Family

## 2023-10-13 VITALS — BP 155/85 | HR 80 | Temp 98.0°F | Resp 16 | Ht 62.0 in | Wt 189.0 lb

## 2023-10-13 DIAGNOSIS — E538 Deficiency of other specified B group vitamins: Secondary | ICD-10-CM

## 2023-10-13 DIAGNOSIS — M069 Rheumatoid arthritis, unspecified: Secondary | ICD-10-CM | POA: Diagnosis not present

## 2023-10-13 DIAGNOSIS — I10 Essential (primary) hypertension: Secondary | ICD-10-CM

## 2023-10-13 DIAGNOSIS — E039 Hypothyroidism, unspecified: Secondary | ICD-10-CM

## 2023-10-13 DIAGNOSIS — E119 Type 2 diabetes mellitus without complications: Secondary | ICD-10-CM | POA: Diagnosis not present

## 2023-10-13 DIAGNOSIS — S93401A Sprain of unspecified ligament of right ankle, initial encounter: Secondary | ICD-10-CM

## 2023-10-13 HISTORY — DX: Sprain of unspecified ligament of right ankle, initial encounter: S93.401A

## 2023-10-13 MED ORDER — CYANOCOBALAMIN 1000 MCG/ML IJ SOLN
1000.0000 ug | Freq: Once | INTRAMUSCULAR | Status: AC
  Administered 2023-10-13: 1000 ug via INTRAMUSCULAR

## 2023-10-13 NOTE — Assessment & Plan Note (Signed)
BP elevated today but pt attributes to stress. Continue amlodipine. Notes bp better at home. Repeat bp here in the office in 1 month.

## 2023-10-13 NOTE — Assessment & Plan Note (Signed)
Imaging negative for fracture.  -If no improvement in another week or two, consider referral to sports medicine.

## 2023-10-13 NOTE — Assessment & Plan Note (Signed)
Negative imaging noted in ED on 2/8.  Continue ace wrap, elevation, pt to call if ankle pain does not start to improve in the next few weks.

## 2023-10-13 NOTE — Patient Instructions (Signed)
VISIT SUMMARY:  Today, we discussed the recent fall you experienced, which resulted in swelling and pain in your right knee. We also reviewed your blood pressure and general health maintenance.  YOUR PLAN:  -FALL WITH RIGHT ankle sprain:  If there is no improvement in another week or two, we may refer you to a sports medicine specialist.  -HYPERTENSION: Your blood pressure was elevated today, likely due to emotional stress. At home, your readings are usually within the normal range. Please recheck your blood pressure in one month and continue to monitor it at home. Let us know if it remains consistently elevated.  -GENERAL HEALTH MAINTENANCE: We will order routine labs to check your A1C, kidney function, thyroid function, and urine protein to ensure your overall health is maintained.

## 2023-10-13 NOTE — Progress Notes (Unsigned)
Subjective:     Patient ID: Sheryl [name], female    DOB: [birth year], 78 y.o.   MRN: [id number]  Chief Complaint  Patient presents with   Follow-up    Follow up from er visit after falling 10/03/23    HPI  Discussed the use of AI scribe software for clinical note transcription with the patient, who gave verbal consent to proceed.  History of Present Illness    Sheryl [name] is a 78 year old female who presents for ED visit follow up. Approximately two weeks ago, she experienced a fall, possibly due to distraction and wearing shoes with thinner soles. During the fall, she landed on her hands and knees, with most of the impact on her right side. She did not hit her head and was able to brace herself with her hands.She was seen in the ED on 2/8 and imaging of knee and ankle were negative for fracture.   Following the fall, she experienced immediate swelling in her right knee, which prompted her to seek medical attention. The swelling has improved but remains more than expected after two weeks, affecting her entire leg and possibly causing bruising. Pain is more pronounced on the lateral aspect of the right ankle and is most comfortable after icing. She attributes a flare-up of arthritis in her knee to the stress of the fall. Prior to the fall, she had been following instructions from an orthopedic doctor in [place] and felt her tendon was mending well, allowing her to participate in activities such as attending a protest.  She mentions using retention hose, which had caused a minor infection that has since resolved. She expresses frustration with the healing process, noting that sprains take a long time to heal.         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   DILATION AND CURETTAGE OF UTERUS  1975   HERNIA REPAIR  10/23/2016   KNEE SURGERY  2006   arthroscopic left knee   KNEE SURGERY  2001   right knee    Family History  Problem Relation Age of Onset   Heart disease Other        CAD   Diabetes Other    Hyperlipidemia Other    Stroke Other  Social History   Socioeconomic History   Marital status: Single    Spouse name: Not on file   Number of children: 1   Years of education: Not on file   Highest education level: Associate degree: academic program  Occupational History   Occupation: unemployed    Employer: DISABLED  Tobacco Use   Smoking status: Former    Current packs/day: 0.00    Types: Cigarettes    Quit date: 04/25/2021    Years since quitting: 2.4   Smokeless tobacco: Never   Tobacco comments:    smokes occasionally but not everyday  Substance and Sexual Activity   Alcohol use: Yes    Alcohol/week: 1.0 standard drink of alcohol    Types: 1 Glasses of wine per week    Comment: occasionally   Drug use: Yes    Comment: Smokes "medical grade" marijuana 2 puffs 3 x weekly for rheumatoid  arthritis.per pt   Sexual activity: Never  Other Topics Concern   Not on file  Social History Narrative   Regular exercise:  Stretching exercises. Resistance bands   Caffeine: 1 mug (2cus) daily.      Social Drivers of Corporate investment banker Strain: Low Risk  (06/18/2023)   Overall Financial Resource Strain (CARDIA)    Difficulty of Paying Living Expenses: Not hard at all  Food Insecurity: No Food Insecurity (06/18/2023)   Hunger Vital Sign    Worried About Running Out of Food in the Last Year: Never true    Ran Out of Food in the Last Year: Never true  Transportation Needs: No Transportation Needs (06/18/2023)   PRAPARE - Administrator, Civil Service (Medical): No    Lack of Transportation (Non-Medical): No  Physical Activity: Unknown (06/18/2023)   Exercise Vital Sign    Days of Exercise per Week: 0 days    Minutes of Exercise per Session: Not on file  Stress: No Stress Concern Present (06/18/2023)   [employer] of Occupational Health - Occupational Stress Questionnaire    Feeling of Stress : Only a little  Social Connections: Moderately Isolated (06/18/2023)   Social Connection and Isolation Panel [NHANES]    Frequency of Communication with Friends and Family: More than three times a week    Frequency of Social Gatherings with Friends and Family: Once a week    Attends Religious Services: Never    Database administrator or Organizations: Yes    Attends Engineer, structural: More than 4 times per year    Marital Status: Divorced  Intimate Partner Violence: Not At Risk (08/08/2022)   Humiliation, Afraid, Rape, and Kick questionnaire    Fear of Current or Ex-Partner: No    Emotionally Abused: No    Physically Abused: No    Sexually Abused: No    Outpatient Medications Prior to Visit  Medication Sig Dispense Refill   albuterol (VENTOLIN HFA) 108 (90 Base) MCG/ACT inhaler Inhale 2 puffs into the lungs every 6 (six) hours as needed for  wheezing or shortness of breath. 6.7 g 5   amLODipine (NORVASC) 5 MG tablet Take 1 tablet (5 mg total) by mouth daily. 90 tablet 0   aspirin 81 MG tablet Take 81 mg by mouth daily.     clobetasol cream (TEMOVATE) 0.05 % Apply to affected areas 2 times a day for 2 weeks on, and 1 week off. Repeat cycle as needed. 60 g 0   Fluticasone-Umeclidin-Vilant (TRELEGY ELLIPTA) 200-62.5-25 MCG/ACT AEPB Inhale 1  puff into the lungs daily. 60 each 5   ibuprofen (ADVIL) 600 MG tablet Take 1 tablet (600 mg total) by mouth every 6-8 hours as needed for pain and swelling. 20 tablet 0   levothyroxine (SYNTHROID) 100 MCG tablet Take 1 tablet (100 mcg total) by mouth daily before breakfast. 90 tablet 1   losartan (COZAAR) 25 MG tablet Take 1 tablet (25 mg total) by mouth daily. 90 tablet 0   montelukast (SINGULAIR) 10 MG tablet Take 1 tablet (10 mg total) by mouth at bedtime. 90 tablet 1   multivitamin (THERAGRAN) per tablet Take 1 tablet by mouth daily.     nirmatrelvir & ritonavir (PAXLOVID, 300/100,) 20 x 150 MG & 10 x 100MG  TBPK Take per package instructions. 30 tablet 0   NON FORMULARY TUMERIC MILK.     NON FORMULARY CBD. Derivative of marijuana without the marijuana.     pantoprazole (PROTONIX) 40 MG tablet Take 1 tablet (40 mg total) by mouth daily. 90 tablet 3   Probiotic Product (PROBIOTIC DAILY PO) Take 1 capsule by mouth daily.     VOLTAREN 1 % GEL APPLY 2 GRAMS TOPICALLY 4 TIMES AS NEEDED 100 g 2   No facility-administered medications prior to visit.    Allergies  Allergen Reactions   Levofloxacin Shortness Of Breath and Rash   Mold Extract [Trichophyton] Anaphylaxis   Ativan [Lorazepam]     Visual hallucinations.  Visual hallucinations.    Cefuroxime Axetil     REACTION: asthma, cough   Hydrocodone Bit-Homatrop Mbr Nausea Only   Lisinopril     Diarrhea hives   Rosuvastatin Calcium     Muscle pain, syncope   Statins     Muscle pain, syncope   Zetia [Ezetimibe] Diarrhea    Hydrocodone-Acetaminophen Nausea Only    ROS     Objective:    Physical Exam Constitutional:      General: She is not in acute distress.    Appearance: Normal appearance. She is well-developed.  HENT:     Head: Normocephalic and atraumatic.     Right Ear: External ear normal.     Left Ear: External ear normal.  Eyes:     General: No scleral icterus. Neck:     Thyroid: No thyromegaly.  Cardiovascular:     Rate and Rhythm: Normal rate and regular rhythm.     Heart sounds: Normal heart sounds. No murmur heard. Pulmonary:     Effort: Pulmonary effort is normal. No respiratory distress.     Breath sounds: Normal breath sounds. No wheezing.  Musculoskeletal:     Cervical back: Neck supple.     Comments: Swelling right ankle  Skin:    General: Skin is warm and dry.     Comments: Abrasion right shin, right knee, bruising at base of right 3rd, 4th, 5th toes  Neurological:     Mental Status: She is alert and oriented to person, place, and time.  Psychiatric:        Mood and Affect: Mood normal.        Behavior: Behavior normal.        Thought Content: Thought content normal.        Judgment: Judgment normal.      BP (!) 155/85   Pulse 80   Temp 98 F (36.7 C) (Oral)   Resp 16   Ht 5\' 2"  (1.575 m)   Wt 189 lb (85.7 kg)   SpO2 98%   BMI 34.57 kg/m  Wt Readings  from Last 3 Encounters:  10/13/23 189 lb (85.7 kg)  06/19/23 183 lb (83 kg)  02/16/23 180 lb (81.6 kg)       Assessment & Plan:   Problem List Items Addressed This Visit       Unprioritized   Sprain of right ankle   Imaging negative for fracture.  -If no improvement in another week or two, consider referral to sports medicine.      Rheumatoid arthritis (HCC)   Reports that it feels that her RA is flaring- she has declined treatment in the past.       Hypothyroidism   Relevant Orders   TSH   Hypertension   BP elevated today but pt attributes to stress. Continue amlodipine. Notes bp better at  home. Repeat bp here in the office in 1 month.       Diabetes type 2, controlled (HCC) - Primary   Relevant Orders   HgB A1c   Basic Metabolic Panel (BMET)   Urine Microalbumin w/creat. ratio   B12 deficiency   B12 injection today.        I am having Sheetal L. Woodcox maintain her aspirin, multivitamin, NON FORMULARY, Probiotic Product (PROBIOTIC DAILY PO), NON FORMULARY, Voltaren, ibuprofen, pantoprazole, clobetasol cream, Paxlovid (300/100), montelukast, levothyroxine, losartan, amLODipine, albuterol, and Trelegy Ellipta.  No orders of the defined types were placed in this encounter.

## 2023-10-13 NOTE — Assessment & Plan Note (Signed)
Reports that it feels that her RA is flaring- she has declined treatment in the past.

## 2023-10-13 NOTE — Progress Notes (Unsigned)
Subjective:     Patient ID: Sheryl [name], female    DOB: 09/29/1945, 78 y.o.   MRN: [id number]  Chief Complaint  Patient presents with   Follow-up    Follow up from er visit after falling 10/03/23    HPI  Discussed the use of AI scribe software for clinical note transcription with the patient, who gave verbal consent to proceed.  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   DILATION AND CURETTAGE OF UTERUS  1975   HERNIA REPAIR  10/23/2016   KNEE SURGERY  2006   arthroscopic left knee   KNEE SURGERY  2001   right knee    Family History  Problem Relation Age of Onset   Heart disease Other        CAD   Diabetes Other    Hyperlipidemia Other    Stroke Other     Social History   Socioeconomic History   Marital status: Single    Spouse name: Not on file   Number of children: 1    Years of education: Not on file   Highest education level: Associate degree: academic program  Occupational History   Occupation: unemployed    Employer: DISABLED  Tobacco Use   Smoking status: Former    Current packs/day: 0.00    Types: Cigarettes    Quit date: 04/25/2021    Years since quitting: 2.4   Smokeless tobacco: Never   Tobacco comments:    smokes occasionally but not everyday  Substance and Sexual Activity   Alcohol use: Yes    Alcohol/week: 1.0 standard drink of alcohol    Types: 1 Glasses of wine per week    Comment: occasionally   Drug use: Yes    Comment: Smokes "medical grade" marijuana 2 puffs 3 x weekly for rheumatoid arthritis.per pt   Sexual activity: Never  Other Topics Concern   Not on file  Social History Narrative   Regular exercise:  Stretching exercises. Resistance bands   Caffeine: 1 mug (2cus) daily.      Social Drivers of Health   Financial Resource Strain: Low Risk  (06/18/2023)   Overall Financial Resource Strain (CARDIA)  Difficulty of Paying Living Expenses: Not hard at all  Food Insecurity: No Food Insecurity (06/18/2023)   Hunger Vital Sign    Worried About Running Out of Food in the Last Year: Never true    Ran Out of Food in the Last Year: Never true  Transportation Needs: No Transportation Needs (06/18/2023)   PRAPARE - Administrator, Civil Service (Medical): No    Lack of Transportation (Non-Medical): No  Physical Activity: Unknown (06/18/2023)   Exercise Vital Sign    Days of Exercise per Week: 0 days    Minutes of Exercise per Session: Not on file  Stress: No Stress Concern Present (06/18/2023)   [employer] of Occupational Health - Occupational Stress Questionnaire    Feeling of Stress : Only a little  Social Connections: Moderately Isolated (06/18/2023)   Social Connection and Isolation Panel [NHANES]    Frequency of Communication with Friends and Family: More than three times a week    Frequency of  Social Gatherings with Friends and Family: Once a week    Attends Religious Services: Never    Database administrator or Organizations: Yes    Attends Engineer, structural: More than 4 times per year    Marital Status: Divorced  Intimate Partner Violence: Not At Risk (08/08/2022)   Humiliation, Afraid, Rape, and Kick questionnaire    Fear of Current or Ex-Partner: No    Emotionally Abused: No    Physically Abused: No    Sexually Abused: No    Outpatient Medications Prior to Visit  Medication Sig Dispense Refill   albuterol (VENTOLIN HFA) 108 (90 Base) MCG/ACT inhaler Inhale 2 puffs into the lungs every 6 (six) hours as needed for wheezing or shortness of breath. 6.7 g 5   amLODipine (NORVASC) 5 MG tablet Take 1 tablet (5 mg total) by mouth daily. 90 tablet 0   aspirin 81 MG tablet Take 81 mg by mouth daily.     clobetasol cream (TEMOVATE) 0.05 % Apply to affected areas 2 times a day for 2 weeks on, and 1 week off. Repeat cycle as needed. 60 g 0   Fluticasone-Umeclidin-Vilant (TRELEGY ELLIPTA) 200-62.5-25 MCG/ACT AEPB Inhale 1 puff into the lungs daily. 60 each 5   ibuprofen (ADVIL) 600 MG tablet Take 1 tablet (600 mg total) by mouth every 6-8 hours as needed for pain and swelling. 20 tablet 0   levothyroxine (SYNTHROID) 100 MCG tablet Take 1 tablet (100 mcg total) by mouth daily before breakfast. 90 tablet 1   losartan (COZAAR) 25 MG tablet Take 1 tablet (25 mg total) by mouth daily. 90 tablet 0   montelukast (SINGULAIR) 10 MG tablet Take 1 tablet (10 mg total) by mouth at bedtime. 90 tablet 1   multivitamin (THERAGRAN) per tablet Take 1 tablet by mouth daily.     nirmatrelvir & ritonavir (PAXLOVID, 300/100,) 20 x 150 MG & 10 x 100MG  TBPK Take per package instructions. 30 tablet 0   NON FORMULARY TUMERIC MILK.     NON FORMULARY CBD. Derivative of marijuana without the marijuana.     pantoprazole (PROTONIX) 40 MG tablet Take 1 tablet (40 mg total) by mouth daily. 90 tablet 3    Probiotic Product (PROBIOTIC DAILY PO) Take 1 capsule by mouth daily.     VOLTAREN 1 % GEL APPLY 2 GRAMS TOPICALLY 4 TIMES AS NEEDED 100 g 2   No facility-administered medications prior to visit.    Allergies  Allergen Reactions  Levofloxacin Shortness Of Breath and Rash   Mold Extract [Trichophyton] Anaphylaxis   Ativan [Lorazepam]     Visual hallucinations.  Visual hallucinations.    Cefuroxime Axetil     REACTION: asthma, cough   Hydrocodone Bit-Homatrop Mbr Nausea Only   Lisinopril     Diarrhea hives   Rosuvastatin Calcium     Muscle pain, syncope   Statins     Muscle pain, syncope   Zetia [Ezetimibe] Diarrhea   Hydrocodone-Acetaminophen Nausea Only    ROS     Objective:    Physical Exam   BP (!) 142/83 (BP Location: Right Arm, Patient Position: Sitting, Cuff Size: Small)   Pulse 80   Temp 98 F (36.7 C) (Oral)   Resp 16   Ht 5\' 2"  (1.575 m)   Wt 189 lb (85.7 kg)   SpO2 98%   BMI 34.57 kg/m  Wt Readings from Last 3 Encounters:  10/13/23 189 lb (85.7 kg)  06/19/23 183 lb (83 kg)  02/16/23 180 lb (81.6 kg)       Assessment & Plan:   Problem List Items Addressed This Visit       Unprioritized   Hypothyroidism   Hypertension   Diabetes type 2, controlled (HCC) - Primary    I am having Sheryl [name] maintain her aspirin, multivitamin, NON FORMULARY, Probiotic Product (PROBIOTIC DAILY PO), NON FORMULARY, Voltaren, ibuprofen, pantoprazole, clobetasol cream, Paxlovid (300/100), montelukast, levothyroxine, losartan, amLODipine, albuterol, and Trelegy Ellipta.  No orders of the defined types were placed in this encounter.

## 2023-10-13 NOTE — Assessment & Plan Note (Signed)
 B12 injection today

## 2023-10-14 LAB — BASIC METABOLIC PANEL
BUN: 23 mg/dL (ref 7–25)
CO2: 23 mmol/L (ref 20–32)
Calcium: 9.3 mg/dL (ref 8.6–10.4)
Chloride: 101 mmol/L (ref 98–110)
Creat: 0.97 mg/dL (ref 0.60–1.00)
Glucose, Bld: 95 mg/dL (ref 65–99)
Potassium: 4.9 mmol/L (ref 3.5–5.3)
Sodium: 135 mmol/L (ref 135–146)

## 2023-10-14 LAB — MICROALBUMIN / CREATININE URINE RATIO
Creatinine, Urine: 101 mg/dL (ref 20–275)
Microalb Creat Ratio: 9 mg/g{creat} (ref <30)
Microalb, Ur: 0.9 mg/dL

## 2023-10-14 LAB — HEMOGLOBIN A1C
Hgb A1c MFr Bld: 6.3 %{Hb} — ABNORMAL HIGH (ref <5.7)
Mean Plasma Glucose: 134 mg/dL
eAG (mmol/L): 7.4 mmol/L

## 2023-10-14 LAB — VITAMIN B12: Vitamin B-12: 649 pg/mL (ref 200–1100)

## 2023-10-14 LAB — TSH: TSH: 2.28 m[IU]/L (ref 0.40–4.50)

## 2023-10-15 ENCOUNTER — Encounter: Payer: Self-pay | Admitting: Family

## 2023-10-21 ENCOUNTER — Ambulatory Visit (INDEPENDENT_AMBULATORY_CARE_PROVIDER_SITE_OTHER): Payer: Medicare Other | Admitting: *Deleted

## 2023-10-21 VITALS — BP 120/70

## 2023-10-21 DIAGNOSIS — Z0000 Encounter for general adult medical examination without abnormal findings: Secondary | ICD-10-CM | POA: Diagnosis not present

## 2023-10-21 NOTE — Progress Notes (Signed)
 Subjective:   Sheryl [name] is a 78 y.o. female who presents for Medicare Annual (Subsequent) preventive examination.  Visit Complete: Virtual I connected with  Sheryl [name] on 10/21/23 by a audio enabled telemedicine application and verified that I am speaking with the correct person using two identifiers.  Patient Location: Home  Provider Location: Office/Clinic  I discussed the limitations of evaluation and management by telemedicine. The patient expressed understanding and agreed to proceed.  Vital Signs: Because this visit was a virtual/telehealth visit, some criteria may be missing or patient reported. Any vitals not documented were not able to be obtained and vitals that have been documented are patient reported.   Cardiac Risk Factors include: advanced age (>25men, >45 women);diabetes mellitus;dyslipidemia;hypertension     Objective:    Today's Vitals   10/21/23 1349  BP: 120/70   There is no height or weight on file to calculate BMI.     10/21/2023    1:03 PM 10/03/2023    6:37 PM 08/08/2022   10:33 AM 08/06/2021   10:28 AM 06/03/2021    4:20 PM 08/14/2020   12:39 PM 07/26/2020   11:20 AM  Advanced Directives  Does Patient Have a Medical Advance Directive? Yes Yes Yes Yes Yes Yes Yes  Type of Estate agent of [place];Living will Living will Healthcare Power of [place];Living will Living will;Healthcare Power of Attorney Living will  Healthcare Power of [place];Living will  Does patient want to make changes to medical advance directive? No - Patient declined  No - Patient declined  No - Patient declined    Copy of Healthcare Power of Attorney in Chart? No - copy requested  No - copy requested No - copy requested   No - copy requested    Current Medications (verified) Outpatient Encounter Medications as of 10/21/2023  Medication Sig   albuterol (VENTOLIN HFA) 108 (90 Base) MCG/ACT inhaler Inhale 2 puffs into the lungs every 6  (six) hours as needed for wheezing or shortness of breath.   amLODipine (NORVASC) 5 MG tablet Take 1 tablet (5 mg total) by mouth daily.   aspirin 81 MG tablet Take 81 mg by mouth daily.   clobetasol cream (TEMOVATE) 0.05 % Apply to affected areas 2 times a day for 2 weeks on, and 1 week off. Repeat cycle as needed.   Fluticasone-Umeclidin-Vilant (TRELEGY ELLIPTA) 200-62.5-25 MCG/ACT AEPB Inhale 1 puff into the lungs daily.   ibuprofen (ADVIL) 600 MG tablet Take 1 tablet (600 mg total) by mouth every 6-8 hours as needed for pain and swelling.   levothyroxine (SYNTHROID) 100 MCG tablet Take 1 tablet (100 mcg total) by mouth daily before breakfast.   losartan (COZAAR) 25 MG tablet Take 1 tablet (25 mg total) by mouth daily.   montelukast (SINGULAIR) 10 MG tablet Take 1 tablet (10 mg total) by mouth at bedtime.   multivitamin (THERAGRAN) per tablet Take 1 tablet by mouth daily.   NON FORMULARY TUMERIC MILK.   NON FORMULARY CBD. Derivative of marijuana without the marijuana.   pantoprazole (PROTONIX) 40 MG tablet Take 1 tablet (40 mg total) by mouth daily.   Probiotic Product (PROBIOTIC DAILY PO) Take 1 capsule by mouth daily.   VOLTAREN 1 % GEL APPLY 2 GRAMS TOPICALLY 4 TIMES AS NEEDED   [DISCONTINUED] lisinopril (ZESTRIL) 10 MG tablet Take 1 tablet (10 mg total) by mouth daily.   [DISCONTINUED] nirmatrelvir & ritonavir (PAXLOVID, 300/100,) 20 x 150 MG & 10 x 100MG  TBPK Take per  package instructions.   No facility-administered encounter medications on file as of 10/21/2023.    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   DILATION AND CURETTAGE OF UTERUS  1975   HERNIA REPAIR  10/23/2016   KNEE SURGERY  2006   arthroscopic left knee   KNEE SURGERY  2001   right knee   Family History  Problem Relation Age of Onset   Heart disease Other        CAD   Diabetes Other    Hyperlipidemia Other    Stroke Other    Social History   Socioeconomic History   Marital status: Single    Spouse name: Not on file   Number of children: 1   Years of education: Not on file   Highest education level: Associate degree: occupational, Scientist, product/process development, or vocational program  Occupational History   Occupation: unemployed    Employer: DISABLED  Tobacco Use   Smoking status: Former    Current packs/day: 0.00    Types: Cigarettes    Quit date: 04/25/2021    Years since quitting: 2.4   Smokeless tobacco: Never   Tobacco comments:    smokes occasionally but not everyday  Substance and Sexual Activity   Alcohol use: Yes    Alcohol/week: 1.0 standard drink of alcohol    Types: 1 Glasses of wine per week    Comment: occasionally   Drug use: Yes    Comment: Smokes "medical grade" marijuana 2 puffs 3 x weekly for rheumatoid arthritis.per pt   Sexual activity: Never  Other Topics Concern   Not on file  Social History Narrative   Regular exercise:  Stretching exercises.  Resistance bands   Caffeine: 1 mug (2cus) daily.      Social Drivers of Corporate investment banker Strain: Low Risk  (10/21/2023)   Overall Financial Resource Strain (CARDIA)    Difficulty of Paying Living Expenses: Not hard at all  Food Insecurity: No Food Insecurity (10/21/2023)   Hunger Vital Sign    Worried About Running Out of Food in the Last Year: Never true    Ran Out of Food in the Last Year: Never true  Transportation Needs: No Transportation Needs (10/21/2023)   PRAPARE - Administrator, Civil Service (Medical): No    Lack of Transportation (Non-Medical): No  Physical Activity: Inactive (10/21/2023)   Exercise Vital Sign    Days of  Exercise per Week: 0 days    Minutes of Exercise per Session: 0 min  Stress: No Stress Concern Present (10/21/2023)   [employer] of Occupational Health - Occupational Stress Questionnaire    Feeling of Stress : Only a little  Social Connections: Moderately Isolated (10/21/2023)   Social Connection and Isolation Panel [NHANES]    Frequency of Communication with Friends and Family: More than three times a week    Frequency of Social Gatherings with Friends and Family: Once a week    Attends Religious Services: Never    Database administrator or Organizations: Yes    Attends Engineer, structural: More than 4 times per year    Marital Status: Divorced    Tobacco Counseling Counseling given: Not Answered Tobacco comments: smokes occasionally but not everyday   Clinical Intake:  Pre-visit preparation completed: Yes  Pain : No/denies pain  Nutritional Risks: None Diabetes: Yes CBG done?: No Did pt. bring in CBG monitor from home?: No  How often do you need to have someone help you when you read instructions, pamphlets, or other written materials from your doctor or pharmacy?: 1 - Never  Interpreter Needed?: No  Information entered by :: Donne Anon, CMA   Activities of Daily Living    10/21/2023    1:21  PM  In your present state of health, do you have any difficulty performing the following activities:  Hearing? 0  Vision? 0  Difficulty concentrating or making decisions? 0  Walking or climbing stairs? 1  Dressing or bathing? 0  Doing errands, shopping? 1  Comment friend Insurance claims handler and eating ? N  Using the Toilet? N  In the past six months, have you accidently leaked urine? Y  Comment stress incontinence  Do you have problems with loss of bowel control? N  Managing your Medications? N  Managing your Finances? N  Housekeeping or managing your Housekeeping? N    Patient Care Team: Sandford Craze, NP as PCP - General (Internal Medicine) Fransico Michael, DDS as Consulting Physician (Dental General Practice) [facility] as Consulting Physician (Ophthalmology) Erling Cruz., MD as Consulting Physician (Gastroenterology) Bonner Puna, MD as Consulting Physician (Surgery) Tresa Res., MD as Consulting Physician (Hematology and Oncology) Sandford Craze, NP as Nurse Practitioner (Internal Medicine)  Indicate any recent Medical Services you may have received from other than [facility] providers in the past year (date may be approximate).     Assessment:   This is a routine wellness examination for [place].  Hearing/Vision screen No results found.   Goals Addressed   None    Depression Screen    10/21/2023    1:07 PM 10/13/2023    1:22 PM 08/08/2022   10:37 AM 08/06/2021   10:46 AM 07/26/2020   11:28 AM 05/27/2019    2:01 PM 05/20/2018   10:29 AM  PHQ 2/9 Scores  PHQ - 2 Score 0 0 0 0 0 0 0  PHQ- 9 Score  0         Fall Risk    10/21/2023    1:09 PM 10/13/2023    1:19 PM 08/08/2022   10:39 AM 08/06/2021   10:39 AM 07/26/2020   11:25 AM  Fall Risk   Falls in the past year? 1 1 0 0 0  Number falls in past yr: 1 1 0 0 0  Injury with Fall? 1 1 0 0 0  Risk for fall due to : History  of fall(s) No Fall Risks No Fall Risks    Follow up Falls  evaluation completed Falls evaluation completed Falls evaluation completed Falls prevention discussed Falls prevention discussed    MEDICARE RISK AT HOME: Medicare Risk at Home Any stairs in or around the home?: No If so, are there any without handrails?: No Home free of loose throw rugs in walkways, pet beds, electrical cords, etc?: No Adequate lighting in your home to reduce risk of falls?: Yes Life alert?: No Use of a cane, walker or w/c?: Yes Grab bars in the bathroom?: No Shower chair or bench in shower?: Yes Elevated toilet seat or a handicapped toilet?: Yes (comfort height)  TIMED UP AND GO:  Was the test performed?  No    Cognitive Function:    08/08/2022   10:54 AM  MMSE - Mini Mental State Exam  Not completed: Unable to complete        10/21/2023    1:40 PM  6CIT Screen  What Year? 0 points  What month? 0 points  What time? 0 points  Count back from 20 0 points  Months in reverse 0 points  Repeat phrase 0 points  Total Score 0 points    Immunizations Immunization History  Administered Date(s) Administered   Fluad Quad(high Dose 65+) 04/29/2019, 05/11/2020, 05/17/2021, 05/16/2022   Fluad Trivalent(High Dose 65+) 06/19/2023   Influenza Split 07/07/2011   Influenza Whole 06/05/2009, 04/24/2010   Influenza, High Dose Seasonal PF 06/29/2015, 04/30/2016, 05/19/2017, 05/24/2018   Influenza,inj,Quad PF,6+ Mos 06/01/2013, 05/25/2014   Moderna Covid-19 Vaccine Bivalent Booster 22yrs & up 01/13/2022   Moderna SARS-COV2 Booster Vaccination 02/12/2021   Moderna Sars-Covid-2 Vaccination 11/02/2019, 11/30/2019, 08/02/2020   Pfizer(Comirnaty)Fall Seasonal Vaccine 12 years and older 07/15/2022, 08/12/2023   Pneumococcal Conjugate-13 06/01/2013   Pneumococcal Polysaccharide-23 10/01/2010, 09/12/2016   Respiratory Syncytial Virus Vaccine,Recomb Aduvanted(Arexvy) 08/12/2023   Td 08/25/2002   Tdap 02/14/2013   Zoster Recombinant(Shingrix) 02/07/2022, 07/15/2022     TDAP status: Due, Education has been provided regarding the importance of this vaccine. Advised may receive this vaccine at local pharmacy or Health Dept. Aware to provide a copy of the vaccination record if obtained from local pharmacy or Health Dept. Verbalized acceptance and understanding.  Flu Vaccine status: Up to date  Pneumococcal vaccine status: Up to date  Covid-19 vaccine status: Information provided on how to obtain vaccines.   Qualifies for Shingles Vaccine? Yes   Zostavax completed No   Shingrix Completed?: Yes  Screening Tests Health Maintenance  Topic Date Due   FOOT EXAM  01/14/2023   OPHTHALMOLOGY EXAM  01/17/2023   DTaP/Tdap/Td (3 - Td or Tdap) 02/15/2023   Medicare Annual Wellness (AWV)  08/09/2023   COVID-19 Vaccine (7 - 2024-25 season) 10/07/2023   HEMOGLOBIN A1C  04/11/2024   Diabetic kidney evaluation - eGFR measurement  10/12/2024   Diabetic kidney evaluation - Urine ACR  10/12/2024   Pneumonia Vaccine 12+ Years old  Completed   INFLUENZA VACCINE  Completed   DEXA SCAN  Completed   Hepatitis C Screening  Completed   Zoster Vaccines- Shingrix  Completed   HPV VACCINES  Aged Out   Colonoscopy  Discontinued    Health Maintenance  Health Maintenance Due  Topic Date Due   FOOT EXAM  01/14/2023   OPHTHALMOLOGY EXAM  01/17/2023   DTaP/Tdap/Td (3 - Td or Tdap) 02/15/2023   Medicare Annual Wellness (AWV)  08/09/2023   COVID-19 Vaccine (7 - 2024-25 season) 10/07/2023    Colorectal cancer  screening: No longer required.   Mammogram status: Completed 03/19/23. Repeat every year  Bone Density status: Pt declined at this time  Lung Cancer Screening: (Low Dose CT Chest recommended if Age 51-80 years, 20 pack-year currently smoking OR have quit w/in 15years.) does not qualify.   Additional Screening:  Hepatitis C Screening: does qualify; Completed 03/07/16  Vision Screening: Recommended annual ophthalmology exams for early detection of glaucoma and  other disorders of the eye. Is the patient up to date with their annual eye exam?  No  Who is the provider or what is the name of the office in which the patient attends annual eye exams? [facility]. If pt is not established with a provider, would they like to be referred to a provider to establish care? No .   Dental Screening: Recommended annual dental exams for proper oral hygiene  Diabetic Foot Exam: Diabetic Foot Exam: Overdue, Pt has been advised about the importance in completing this exam. Pt is scheduled for diabetic foot exam on N/a.  Community Resource Referral / Chronic Care Management: CRR required this visit?  No   CCM required this visit?  No     Plan:     I have personally reviewed and noted the following in the patient's chart:   Medical and social history Use of alcohol, tobacco or illicit drugs  Current medications and supplements including opioid prescriptions. Patient is not currently taking opioid prescriptions. Functional ability and status Nutritional status Physical activity Advanced directives List of other physicians Hospitalizations, surgeries, and ER visits in previous 12 months Vitals Screenings to include cognitive, depression, and falls Referrals and appointments  In addition, I have reviewed and discussed with patient certain preventive protocols, quality metrics, and best practice recommendations. A written personalized care plan for preventive services as well as general preventive health recommendations were provided to patient.     Donne Anon, CMA   10/21/2023   After Visit Summary: (MyChart) Due to this being a telephonic visit, the after visit summary with patients personalized plan was offered to patient via MyChart   Nurse Notes: None

## 2023-10-21 NOTE — Patient Instructions (Signed)
 [name] , Thank you for taking time to come for your Medicare Wellness Visit. I appreciate your ongoing commitment to your health goals. Please review the following plan we discussed and let me know if I can assist you in the future.     This is a list of the screening recommended for you and due dates:  Health Maintenance  Topic Date Due   Complete foot exam   01/14/2023   Eye exam for diabetics  01/17/2023   DTaP/Tdap/Td vaccine (3 - Td or Tdap) 02/15/2023   COVID-19 Vaccine (7 - 2024-25 season) 10/07/2023   Hemoglobin A1C  04/11/2024   Yearly kidney function blood test for diabetes  10/12/2024   Yearly kidney health urinalysis for diabetes  10/12/2024   Medicare Annual Wellness Visit  10/20/2024   Pneumonia Vaccine  Completed   Flu Shot  Completed   DEXA scan (bone density measurement)  Completed   Hepatitis C Screening  Completed   Zoster (Shingles) Vaccine  Completed   HPV Vaccine  Aged Out   Colon Cancer Screening  Discontinued    Next appointment: Follow up in one year for your annual wellness visit.   Preventive Care 41 Years and Older, Female Preventive care refers to lifestyle choices and visits with your health care provider that can promote health and wellness. What does preventive care include? A yearly physical exam. This is also called an annual well check. Dental exams once or twice a year. Routine eye exams. Ask your health care provider how often you should have your eyes checked. Personal lifestyle choices, including: Daily care of your teeth and gums. Regular physical activity. Eating a healthy diet. Avoiding tobacco and drug use. Limiting alcohol use. Practicing safe sex. Taking low-dose aspirin every day. Taking vitamin and mineral supplements as recommended by your health care provider. What happens during an annual well check? The services and screenings done by your health care provider during your annual well check will depend on your age,  overall health, lifestyle risk factors, and family history of disease. Counseling  Your health care provider may ask you questions about your: Alcohol use. Tobacco use. Drug use. Emotional well-being. Home and relationship well-being. Sexual activity. Eating habits. History of falls. Memory and ability to understand (cognition). Work and work Astronomer. Reproductive health. Screening  You may have the following tests or measurements: Height, weight, and BMI. Blood pressure. Lipid and cholesterol levels. These may be checked every 5 years, or more frequently if you are over 28 years old. Skin check. Lung cancer screening. You may have this screening every year starting at age 88 if you have a 30-pack-year history of smoking and currently smoke or have quit within the past 15 years. Fecal occult blood test (FOBT) of the stool. You may have this test every year starting at age 67. Flexible sigmoidoscopy or colonoscopy. You may have a sigmoidoscopy every 5 years or a colonoscopy every 10 years starting at age 58. Hepatitis C blood test. Hepatitis B blood test. Sexually transmitted disease (STD) testing. Diabetes screening. This is done by checking your blood sugar (glucose) after you have not eaten for a while (fasting). You may have this done every 1-3 years. Bone density scan. This is done to screen for osteoporosis. You may have this done starting at age 74. Mammogram. This may be done every 1-2 years. Talk to your health care provider about how often you should have regular mammograms. Talk with your health care provider about your test  results, treatment options, and if necessary, the need for more tests. Vaccines  Your health care provider may recommend certain vaccines, such as: Influenza vaccine. This is recommended every year. Tetanus, diphtheria, and acellular pertussis (Tdap, Td) vaccine. You may need a Td booster every 10 years. Zoster vaccine. You may need this after age  30. Pneumococcal 13-valent conjugate (PCV13) vaccine. One dose is recommended after age 32. Pneumococcal polysaccharide (PPSV23) vaccine. One dose is recommended after age 67. Talk to your health care provider about which screenings and vaccines you need and how often you need them. This information is not intended to replace advice given to you by your health care provider. Make sure you discuss any questions you have with your health care provider. Document Released: 09/07/2015 Document Revised: 04/30/2016 Document Reviewed: 06/12/2015 Elsevier Interactive Patient Education  2017 [employer].  Fall Prevention in the Home Falls can cause injuries. They can happen to people of all ages. There are many things you can do to make your home safe and to help prevent falls. What can I do on the outside of my home? Regularly fix the edges of walkways and driveways and fix any cracks. Remove anything that might make you trip as you walk through a door, such as a raised step or threshold. Trim any bushes or trees on the path to your home. Use bright outdoor lighting. Clear any walking paths of anything that might make someone trip, such as rocks or tools. Regularly check to see if handrails are loose or broken. Make sure that both sides of any steps have handrails. Any raised decks and porches should have guardrails on the edges. Have any leaves, snow, or ice cleared regularly. Use sand or salt on walking paths during winter. Clean up any spills in your garage right away. This includes oil or grease spills. What can I do in the bathroom? Use night lights. Install grab bars by the toilet and in the tub and shower. Do not use towel bars as grab bars. Use non-skid mats or decals in the tub or shower. If you need to sit down in the shower, use a plastic, non-slip stool. Keep the floor dry. Clean up any water that spills on the floor as soon as it happens. Remove soap buildup in the tub or shower  regularly. Attach bath mats securely with double-sided non-slip rug tape. Do not have throw rugs and other things on the floor that can make you trip. What can I do in the bedroom? Use night lights. Make sure that you have a light by your bed that is easy to reach. Do not use any sheets or blankets that are too big for your bed. They should not hang down onto the floor. Have a firm chair that has side arms. You can use this for support while you get dressed. Do not have throw rugs and other things on the floor that can make you trip. What can I do in the kitchen? Clean up any spills right away. Avoid walking on wet floors. Keep items that you use a lot in easy-to-reach places. If you need to reach something above you, use a strong step stool that has a grab bar. Keep electrical cords out of the way. Do not use floor polish or wax that makes floors slippery. If you must use wax, use non-skid floor wax. Do not have throw rugs and other things on the floor that can make you trip. What can I do with my stairs?  Do not leave any items on the stairs. Make sure that there are handrails on both sides of the stairs and use them. Fix handrails that are broken or loose. Make sure that handrails are as long as the stairways. Check any carpeting to make sure that it is firmly attached to the stairs. Fix any carpet that is loose or worn. Avoid having throw rugs at the top or bottom of the stairs. If you do have throw rugs, attach them to the floor with carpet tape. Make sure that you have a light switch at the top of the stairs and the bottom of the stairs. If you do not have them, ask someone to add them for you. What else can I do to help prevent falls? Wear shoes that: Do not have high heels. Have rubber bottoms. Are comfortable and fit you well. Are closed at the toe. Do not wear sandals. If you use a stepladder: Make sure that it is fully opened. Do not climb a closed stepladder. Make sure that  both sides of the stepladder are locked into place. Ask someone to hold it for you, if possible. Clearly mark and make sure that you can see: Any grab bars or handrails. First and last steps. Where the edge of each step is. Use tools that help you move around (mobility aids) if they are needed. These include: Canes. Walkers. Scooters. Crutches. Turn on the lights when you go into a dark area. Replace any light bulbs as soon as they burn out. Set up your furniture so you have a clear path. Avoid moving your furniture around. If any of your floors are uneven, fix them. If there are any pets around you, be aware of where they are. Review your medicines with your doctor. Some medicines can make you feel dizzy. This can increase your chance of falling. Ask your doctor what other things that you can do to help prevent falls. This information is not intended to replace advice given to you by your health care provider. Make sure you discuss any questions you have with your health care provider. Document Released: 06/07/2009 Document Revised: 01/17/2016 Document Reviewed: 09/15/2014 Elsevier Interactive Patient Education  2017 [employer].

## 2023-10-26 ENCOUNTER — Other Ambulatory Visit (HOSPITAL_BASED_OUTPATIENT_CLINIC_OR_DEPARTMENT_OTHER): Payer: Self-pay

## 2023-10-26 ENCOUNTER — Ambulatory Visit (INDEPENDENT_AMBULATORY_CARE_PROVIDER_SITE_OTHER): Payer: Medicare Other | Admitting: Family

## 2023-10-26 VITALS — BP 146/68 | HR 80 | Temp 98.0°F | Resp 16 | Ht 62.0 in | Wt 187.0 lb

## 2023-10-26 DIAGNOSIS — M25471 Effusion, right ankle: Secondary | ICD-10-CM

## 2023-10-26 DIAGNOSIS — E119 Type 2 diabetes mellitus without complications: Secondary | ICD-10-CM | POA: Diagnosis not present

## 2023-10-26 DIAGNOSIS — I10 Essential (primary) hypertension: Secondary | ICD-10-CM | POA: Diagnosis not present

## 2023-10-26 DIAGNOSIS — E538 Deficiency of other specified B group vitamins: Secondary | ICD-10-CM

## 2023-10-26 NOTE — Assessment & Plan Note (Signed)
 Continues b12 injections- too soon to give today.

## 2023-10-26 NOTE — Assessment & Plan Note (Addendum)
 Likely sprain. Pain and swelling persist, but improved from initial injury. No fracture on X-ray. Swelling decreases with rest and increases with use. Using brace for support. -Continue conservative management with rest, elevation, and brace use. -Consider referral to sports medicine if no further improvement.

## 2023-10-26 NOTE — Patient Instructions (Signed)
 VISIT SUMMARY:  Today, we discussed the ongoing issues with your right ankle pain and swelling, as well as your overall health management including diabetes, vitamin B12 deficiency, and hypertension.  YOUR PLAN:  -RIGHT ANKLE INJURY: You have persistent pain and swelling in your right ankle, but there is no fracture as confirmed by an x-ray. The swelling decreases with rest and increases with activity. Continue to manage this with rest, elevation, and using the brace for support. If there is no further improvement, we may consider referring you to a sports medicine specialist.  -TYPE 2 DIABETES MELLITUS: Your blood sugar levels are stable with an A1c of 6.3. However, your diet has been less controlled due to your ankle injury and limited mobility. As your ankle improves, try to return to a healthier diet and regular exercise to better manage your diabetes.  -VITAMIN B12 DEFICIENCY: You have a vitamin B12 deficiency, and your last injection was given at the previous visit. Your next B12 injection is due in approximately one week.  -HYPERTENSION: Your blood pressure was potentially elevated during this visit. We will consider rechecking your blood pressure at the next visit to monitor it more closely.  INSTRUCTIONS:  Please continue with the conservative management for your ankle, including rest, elevation, and brace use. Return for your next B12 injection in approximately one week. We will recheck your blood pressure at your next visit. If your ankle does not improve, we may refer you to a sports medicine specialist.

## 2023-10-26 NOTE — Assessment & Plan Note (Signed)
 Lab Results  Component Value Date   HGBA1C 6.3 (H) 10/13/2023   HGBA1C 6.0 06/19/2023   HGBA1C 6.0 02/16/2023   Lab Results  Component Value Date   MICROALBUR 0.9 10/13/2023   LDLCALC 134 (H) 10/17/2022   CREATININE 0.97 10/13/2023   A1C stable, continue to work on exercise.

## 2023-10-26 NOTE — Progress Notes (Signed)
 Subjective:     Patient ID: Sheryl [name], female    DOB: Jun 29, 1946, 78 y.o.   MRN: [id number]  Chief Complaint  Patient presents with   Joint Swelling    Patient here for follow up on right ankle swelling     HPI  Discussed the use of AI scribe software for clinical note transcription with the patient, who gave verbal consent to proceed.  History of Present Illness   Sheryl [name] is a 78 year old female who presents with right ankle pain and swelling.  She has persistent pain and swelling in her right ankle. The ankle is not broken, as confirmed by a recent x-ray. The pain is described as somewhat bad, and the swelling is significant, especially after standing or sitting for prolonged periods. Swelling reduces overnight when lying flat but increases with activity during the day. The ankle is red in certain areas, particularly after showering and getting ready in the morning.  The use of a brace has been beneficial, providing the best support compared to others she has tried. She experiences pain when walking with the brace, which varies depending on the duration of activity. She has stopped using a walker and only occasionally uses a cane, particularly later in the day when the swelling worsens and she feels less steady.  She has been performing balance exercises, which she believes have helped manage the instability caused by the ankle issue. Her diet has not been optimal due to limited mobility, often resorting to quick meals like peanut butter and jelly sandwiches and grilled cheese, which she acknowledges are high in calories. Standing to cook exacerbates the swelling.    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   DILATION AND CURETTAGE OF UTERUS  1975   HERNIA REPAIR  10/23/2016   KNEE SURGERY  2006   arthroscopic left knee   KNEE SURGERY  2001   right knee    Family History  Problem Relation Age of Onset   Heart disease Other        CAD   Diabetes Other    Hyperlipidemia Other    Stroke Other     Social History   Socioeconomic History   Marital status: Single    Spouse name: Not on file   Number of children: 1  Years of education: Not on file   Highest education level: Associate degree: occupational, Scientist, product/process development, or vocational program  Occupational History   Occupation: unemployed    Employer: DISABLED  Tobacco Use   Smoking status: Former    Current packs/day: 0.00    Types: Cigarettes    Quit date: 04/25/2021    Years since quitting: 2.5   Smokeless tobacco: Never   Tobacco comments:    smokes occasionally but not everyday  Substance and Sexual Activity   Alcohol use: Yes    Alcohol/week: 1.0 standard drink of alcohol    Types: 1 Glasses of wine per week    Comment: occasionally   Drug use: Yes    Comment: Smokes "medical grade" marijuana 2 puffs 3 x weekly for rheumatoid arthritis.per pt   Sexual activity: Never  Other  Topics Concern   Not on file  Social History Narrative   Regular exercise:  Stretching exercises. Resistance bands   Caffeine: 1 mug (2cus) daily.      Social Drivers of Corporate investment banker Strain: Low Risk  (10/21/2023)   Overall Financial Resource Strain (CARDIA)    Difficulty of Paying Living Expenses: Not hard at all  Food Insecurity: No Food Insecurity (10/21/2023)   Hunger Vital Sign    Worried About Running Out of Food in the Last Year: Never true    Ran Out of Food in the Last Year: Never true  Transportation Needs: No Transportation Needs (10/21/2023)   PRAPARE - Administrator, Civil Service (Medical): No    Lack of Transportation (Non-Medical): No  Physical Activity: Inactive (10/21/2023)   Exercise Vital Sign    Days of Exercise per Week: 0 days    Minutes of Exercise per Session: 0 min  Stress: No Stress Concern Present (10/21/2023)   [employer] of Occupational Health - Occupational Stress Questionnaire    Feeling of Stress : Only a little  Social Connections: Moderately Isolated (10/21/2023)   Social Connection and Isolation Panel [NHANES]    Frequency of Communication with Friends and Family: More than three times a week    Frequency of Social Gatherings with Friends and Family: Once a week    Attends Religious Services: Never    Database administrator or Organizations: Yes    Attends Engineer, structural: More than 4 times per year    Marital Status: Divorced  Intimate Partner Violence: Not At Risk (10/21/2023)   Humiliation, Afraid, Rape, and Kick questionnaire    Fear of Current or Ex-Partner: No    Emotionally Abused: No    Physically Abused: No    Sexually Abused: No    Outpatient Medications Prior to Visit  Medication Sig Dispense Refill   albuterol (VENTOLIN HFA) 108 (90 Base) MCG/ACT inhaler Inhale 2 puffs into the lungs every 6 (six) hours as needed for wheezing or shortness of breath. 6.7 g 5   amLODipine (NORVASC) 5  MG tablet Take 1 tablet (5 mg total) by mouth daily. 90 tablet 0   aspirin 81 MG tablet Take 81 mg by mouth daily.     clobetasol cream (TEMOVATE) 0.05 % Apply to affected areas 2 times a day for 2 weeks on, and 1 week off. Repeat cycle as needed. 60 g 0   Fluticasone-Umeclidin-Vilant (TRELEGY ELLIPTA) 200-62.5-25 MCG/ACT AEPB Inhale 1 puff into the lungs daily. 60 each 5   ibuprofen (ADVIL) 600 MG tablet Take 1 tablet (600 mg total) by mouth every 6-8  hours as needed for pain and swelling. 20 tablet 0   levothyroxine (SYNTHROID) 100 MCG tablet Take 1 tablet (100 mcg total) by mouth daily before breakfast. 90 tablet 1   losartan (COZAAR) 25 MG tablet Take 1 tablet (25 mg total) by mouth daily. 90 tablet 0   montelukast (SINGULAIR) 10 MG tablet Take 1 tablet (10 mg total) by mouth at bedtime. 90 tablet 1   multivitamin (THERAGRAN) per tablet Take 1 tablet by mouth daily.     NON FORMULARY TUMERIC MILK.     NON FORMULARY CBD. Derivative of marijuana without the marijuana.     pantoprazole (PROTONIX) 40 MG tablet Take 1 tablet (40 mg total) by mouth daily. 90 tablet 3   Probiotic Product (PROBIOTIC DAILY PO) Take 1 capsule by mouth daily.     VOLTAREN 1 % GEL APPLY 2 GRAMS TOPICALLY 4 TIMES AS NEEDED 100 g 2   No facility-administered medications prior to visit.    Allergies  Allergen Reactions   Levofloxacin Shortness Of Breath and Rash   Mold Extract [Trichophyton] Anaphylaxis   Ativan [Lorazepam]     Visual hallucinations.  Visual hallucinations.    Cefuroxime Axetil     REACTION: asthma, cough   Hydrocodone Bit-Homatrop Mbr Nausea Only   Lisinopril     Diarrhea hives   Rosuvastatin Calcium     Muscle pain, syncope   Statins     Muscle pain, syncope   Zetia [Ezetimibe] Diarrhea   Hydrocodone-Acetaminophen Nausea Only    ROS     Objective:    Physical Exam Constitutional:      Appearance: Normal appearance.  Cardiovascular:     Rate and Rhythm: Normal rate.  Pulmonary:      Effort: Pulmonary effort is normal.  Musculoskeletal:     Comments: Mild swelling right ankle laterally  Neurological:     Mental Status: She is alert and oriented to person, place, and time.      BP (!) 146/68 (BP Location: Right Arm, Patient Position: Sitting)   Pulse 80   Temp 98 F (36.7 C) (Oral)   Resp 16   Ht 5\' 2"  (1.575 m)   Wt 187 lb (84.8 kg)   SpO2 99%   BMI 34.20 kg/m  Wt Readings from Last 3 Encounters:  10/26/23 187 lb (84.8 kg)  10/13/23 189 lb (85.7 kg)  06/19/23 183 lb (83 kg)       Assessment & Plan:   Problem List Items Addressed This Visit       Unprioritized   Right ankle swelling - Primary   Likely sprain. Pain and swelling persist, but improved from initial injury. No fracture on X-ray. Swelling decreases with rest and increases with use. Using brace for support. -Continue conservative management with rest, elevation, and brace use. -Consider referral to sports medicine if no further improvement.      Hypertension   BP Readings from Last 3 Encounters:  10/26/23 (!) 146/68  10/21/23 120/70  10/13/23 (!) 155/85   Was better last time, declined repeat bp. Will continue to monitor.       Diabetes type 2, controlled (HCC)   Lab Results  Component Value Date   HGBA1C 6.3 (H) 10/13/2023   HGBA1C 6.0 06/19/2023   HGBA1C 6.0 02/16/2023   Lab Results  Component Value Date   MICROALBUR 0.9 10/13/2023   LDLCALC 134 (H) 10/17/2022   CREATININE 0.97 10/13/2023   A1C stable, continue to work on exercise.  B12 deficiency   Continues b12 injections- too soon to give today.        I am having Kyra L. Blass maintain her aspirin, multivitamin, NON FORMULARY, Probiotic Product (PROBIOTIC DAILY PO), NON FORMULARY, Voltaren, ibuprofen, pantoprazole, clobetasol cream, montelukast, levothyroxine, losartan, amLODipine, albuterol, and Trelegy Ellipta.  No orders of the defined types were placed in this encounter.

## 2023-10-26 NOTE — Assessment & Plan Note (Signed)
 BP Readings from Last 3 Encounters:  10/26/23 (!) 146/68  10/21/23 120/70  10/13/23 (!) 155/85   Was better last time, declined repeat bp. Will continue to monitor.

## 2023-10-27 ENCOUNTER — Telehealth: Payer: Self-pay

## 2023-10-27 NOTE — Telephone Encounter (Signed)
 Copied from CRM [id number]. Topic: Clinical - Medication Question >> Oct 27, 2023 11:25 AM Sonny Dandy B wrote: Reason for CRM: pt called to request a prescription for a walker, pt states she forgot to ask for one yesterday at her visit. Please call pt back [phone]

## 2023-10-28 ENCOUNTER — Other Ambulatory Visit: Payer: Self-pay

## 2023-10-28 DIAGNOSIS — S93401A Sprain of unspecified ligament of right ankle, initial encounter: Secondary | ICD-10-CM

## 2023-10-28 NOTE — Telephone Encounter (Signed)
 Ok per provider, order entered as dme. Community message sent to [employer] for processing

## 2023-11-02 ENCOUNTER — Other Ambulatory Visit (HOSPITAL_BASED_OUTPATIENT_CLINIC_OR_DEPARTMENT_OTHER): Payer: Self-pay

## 2023-11-17 ENCOUNTER — Other Ambulatory Visit: Payer: Self-pay | Admitting: Family

## 2023-11-17 ENCOUNTER — Other Ambulatory Visit (HOSPITAL_BASED_OUTPATIENT_CLINIC_OR_DEPARTMENT_OTHER): Payer: Self-pay

## 2023-11-17 MED ORDER — PANTOPRAZOLE SODIUM 40 MG PO TBEC
40.0000 mg | DELAYED_RELEASE_TABLET | Freq: Every day | ORAL | 1 refills | Status: DC
  Filled 2023-11-17: qty 90, 90d supply, fill #0
  Filled 2024-02-12: qty 90, 90d supply, fill #1

## 2023-11-18 ENCOUNTER — Other Ambulatory Visit (HOSPITAL_BASED_OUTPATIENT_CLINIC_OR_DEPARTMENT_OTHER): Payer: Self-pay

## 2023-12-02 ENCOUNTER — Telehealth: Payer: Self-pay | Admitting: Family

## 2023-12-02 NOTE — Telephone Encounter (Signed)
 If you guys have coverage and an order I can add her, I assume she means for today.

## 2023-12-02 NOTE — Telephone Encounter (Signed)
 Copied from CRM [id number]. Topic: Appointments - Scheduling Inquiry for Clinic >> Dec 02, 2023 10:59 AM Sheryl [name] wrote: Reason for CRM: Patient called in stating she would like to be schedule for her b12 shot, would like to know if someone can call her to be schedule 2pm to 2:30pm is when she will be out

## 2023-12-03 ENCOUNTER — Other Ambulatory Visit (HOSPITAL_BASED_OUTPATIENT_CLINIC_OR_DEPARTMENT_OTHER): Payer: Self-pay

## 2023-12-03 ENCOUNTER — Ambulatory Visit (INDEPENDENT_AMBULATORY_CARE_PROVIDER_SITE_OTHER)

## 2023-12-03 DIAGNOSIS — E538 Deficiency of other specified B group vitamins: Secondary | ICD-10-CM

## 2023-12-03 MED ORDER — CYANOCOBALAMIN 1000 MCG/ML IJ SOLN
1000.0000 ug | Freq: Once | INTRAMUSCULAR | Status: AC
  Administered 2023-12-03: 1000 ug via INTRAMUSCULAR

## 2023-12-03 NOTE — Progress Notes (Signed)
 Sheryl [name] is a 78 y.o. female presents to the office today for monthly B12:injections, per physician's orders. Original order: Per Sandford Craze NP Cyanocobalamin (med), 1000 mg/ml (dose),  IM (route) was administered left deltoid (location) today. Patient tolerated injection.  Patient next injection due: in one month, appt made Yes.  [place]

## 2023-12-29 ENCOUNTER — Other Ambulatory Visit: Payer: Self-pay

## 2023-12-29 ENCOUNTER — Other Ambulatory Visit (HOSPITAL_BASED_OUTPATIENT_CLINIC_OR_DEPARTMENT_OTHER): Payer: Self-pay

## 2023-12-29 ENCOUNTER — Other Ambulatory Visit: Payer: Self-pay | Admitting: Family

## 2023-12-29 DIAGNOSIS — I10 Essential (primary) hypertension: Secondary | ICD-10-CM

## 2023-12-29 MED ORDER — TRELEGY ELLIPTA 200-62.5-25 MCG/ACT IN AEPB
1.0000 | INHALATION_SPRAY | Freq: Every day | RESPIRATORY_TRACT | 1 refills | Status: DC
  Filled 2023-12-29: qty 180, 90d supply, fill #0
  Filled 2024-03-23: qty 180, 90d supply, fill #1

## 2023-12-29 MED ORDER — AMLODIPINE BESYLATE 5 MG PO TABS
5.0000 mg | ORAL_TABLET | Freq: Every day | ORAL | 0 refills | Status: DC
  Filled 2023-12-29: qty 90, 90d supply, fill #0

## 2024-01-01 ENCOUNTER — Other Ambulatory Visit (HOSPITAL_BASED_OUTPATIENT_CLINIC_OR_DEPARTMENT_OTHER): Payer: Self-pay

## 2024-01-05 ENCOUNTER — Ambulatory Visit (INDEPENDENT_AMBULATORY_CARE_PROVIDER_SITE_OTHER): Admitting: Emergency Medicine

## 2024-01-05 DIAGNOSIS — E538 Deficiency of other specified B group vitamins: Secondary | ICD-10-CM | POA: Diagnosis not present

## 2024-01-05 MED ORDER — CYANOCOBALAMIN 1000 MCG/ML IJ SOLN
1000.0000 ug | Freq: Once | INTRAMUSCULAR | Status: AC
  Administered 2024-01-05: 1000 ug via INTRAMUSCULAR

## 2024-01-05 NOTE — Progress Notes (Signed)
 Patient here for monthly b12 injection per physicians order.  Injection given in left deltoid and patient tolerated well.

## 2024-01-13 DIAGNOSIS — H04123 Dry eye syndrome of bilateral lacrimal glands: Secondary | ICD-10-CM | POA: Diagnosis not present

## 2024-01-13 DIAGNOSIS — G43809 Other migraine, not intractable, without status migrainosus: Secondary | ICD-10-CM | POA: Diagnosis not present

## 2024-01-13 DIAGNOSIS — H2513 Age-related nuclear cataract, bilateral: Secondary | ICD-10-CM | POA: Diagnosis not present

## 2024-01-13 LAB — HM DIABETES EYE EXAM

## 2024-01-27 ENCOUNTER — Telehealth: Payer: Self-pay | Admitting: Family

## 2024-01-27 NOTE — Telephone Encounter (Signed)
 Copied from CRM [id number]. Topic: Medicare AWV >> Jan 27, 2024  3:22 PM Juliana Ocean wrote: Reason for CRM: LVM 01/27/2024 to schedule AWV. Please schedule Virtual or Telehealth visits ONLY  Rosalee Collins; Care Guide Ambulatory Clinical Support [place] [facility] Direct Dial: [phone]

## 2024-02-05 ENCOUNTER — Ambulatory Visit (INDEPENDENT_AMBULATORY_CARE_PROVIDER_SITE_OTHER)

## 2024-02-05 DIAGNOSIS — E538 Deficiency of other specified B group vitamins: Secondary | ICD-10-CM | POA: Diagnosis not present

## 2024-02-05 MED ORDER — CYANOCOBALAMIN 1000 MCG/ML IJ SOLN
1000.0000 ug | Freq: Once | INTRAMUSCULAR | Status: AC
  Administered 2024-02-05: 1000 ug via INTRAMUSCULAR

## 2024-02-05 NOTE — Progress Notes (Signed)
 Pt here for monthly B12 injection per original order dated: per [name] O'Sullivan,NP 07/15/2022:B12 and folate levels are good. Please continue your b12 injections.   Last B12 injection: 01/05/24  Last B12 level:  10/10/23  B12 1000mcg given IM, left deltoid and pt tolerated injection well.  Next B12 injection scheduled for: 03/07/24

## 2024-02-10 ENCOUNTER — Ambulatory Visit: Payer: Self-pay

## 2024-02-10 NOTE — Telephone Encounter (Signed)
 FYI Only or Action Required?: FYI only for provider  Patient was last seen in primary care on 10/26/2023 by Sheryl Gaucher, NP. Called Nurse Triage reporting Shortness of Breath, Fatigue, and Dizziness. Symptoms began several weeks ago. Interventions attempted: Prescription medications: inhalers and Rest, hydration, or home remedies. Symptoms are: unchanged.  Triage Disposition: See PCP When Office is Open (Within 3 Days), See HCP Within 4 Hours (Or PCP Triage)  Patient/caregiver understands and will follow disposition?: Yes, but will wait  Copied from CRM [id number]. Topic: Clinical - Red Word Triage >> Feb 10, 2024 11:56 AM Chasity T wrote: [facility] that prompted transfer to Nurse Triage: patient is calling in because she is having extreme fatigue, shortness of breath, wheezing (does has asthma), and dizziness. Reason for Disposition  [1] MODERATE weakness (i.e., interferes with work, school, normal activities) AND [2] cause unknown  (Exceptions: Weakness from acute minor illness or poor fluid intake; weakness is chronic and not worse.)  [1] MILD longstanding difficulty breathing AND [2]  SAME as normal  Answer Assessment - Initial Assessment Questions 1. DESCRIPTION: Describe how you are feeling.     Fatigues easily 2. SEVERITY: How bad is it?  Can you stand and walk?   - MILD (0-3): Feels weak or tired, but does not interfere with work, school or normal activities.   - MODERATE (4-7): Able to stand and walk; weakness interferes with work, school, or normal activities.   - SEVERE (8-10): Unable to stand or walk; unable to do usual activities.     moderate 3. ONSET: When did these symptoms begin? (e.g., hours, days, weeks, months)     Awhile 4. CAUSE: What do you think is causing the weakness or fatigue? (e.g., not drinking enough fluids, medical problem, trouble sleeping)     Asthma  5. OTHER SYMPTOMS: Do you have any other symptoms? (e.g., chest pain, fever, cough, SOB,  vomiting, diarrhea, bleeding, other areas of pain)     Occasional dizziness  Additional info: Asthma bad this season.  Intermittent use of rescue inhaler, not more frequent than prescribed. Breathing worse when outside, breathing well in cool house. Speaking full sentences on call. History of pericarditis. Offered acute visit today but she declines appointment due to no ride, she request appointment on June 27 and will seek emergent treatment if symptoms worsen.  Answer Assessment - Initial Assessment Questions 1. RESPIRATORY STATUS: Describe your breathing? (e.g., wheezing, shortness of breath, unable to speak, severe coughing)      SOB  2. ONSET: When did this breathing problem begin?      Chronic-asthma 3. PATTERN Does the difficult breathing come and go, or has it been constant since it started?      intermittent 4. SEVERITY: How bad is your breathing? (e.g., mild, moderate, severe)    - MILD: No SOB at rest, mild SOB with walking, speaks normally in sentences, can lie down, no retractions, pulse < 100.    - MODERATE: SOB at rest, SOB with minimal exertion and prefers to sit, cannot lie down flat, speaks in phrases, mild retractions, audible wheezing, pulse 100-120.    - SEVERE: Very SOB at rest, speaks in single words, struggling to breathe, sitting hunched forward, retractions, pulse > 120      Mild-moderate 5. RECURRENT SYMPTOM: Have you had difficulty breathing before? If Yes, ask: When was the last time? and What happened that time?      Yes 6. CARDIAC HISTORY: Do you have any history of heart disease? (  e.g., heart attack, angina, bypass surgery, angioplasty)      pericarditis 7. LUNG HISTORY: Do you have any history of lung disease?  (e.g., pulmonary embolus, asthma, emphysema)     asthma 8. CAUSE: What do you think is causing the breathing problem?      Pollen, mold 9. OTHER SYMPTOMS: Do you have any other symptoms? (e.g., dizziness, runny nose, cough, chest  pain, fever)     Fatigue, occasional dizziness. Additional info: 124/73, 85 pulse, O2 95%  Protocols used: Weakness (Generalized) and Fatigue-A-AH, Breathing Difficulty-A-AH

## 2024-02-12 ENCOUNTER — Other Ambulatory Visit (HOSPITAL_BASED_OUTPATIENT_CLINIC_OR_DEPARTMENT_OTHER): Payer: Self-pay

## 2024-02-12 ENCOUNTER — Other Ambulatory Visit: Payer: Self-pay | Admitting: Family

## 2024-02-12 ENCOUNTER — Other Ambulatory Visit: Payer: Self-pay

## 2024-02-12 MED ORDER — LOSARTAN POTASSIUM 25 MG PO TABS
25.0000 mg | ORAL_TABLET | Freq: Every day | ORAL | 0 refills | Status: DC
  Filled 2024-02-12: qty 90, 90d supply, fill #0

## 2024-02-12 MED ORDER — MONTELUKAST SODIUM 10 MG PO TABS
10.0000 mg | ORAL_TABLET | Freq: Every day | ORAL | 0 refills | Status: DC
  Filled 2024-02-12: qty 90, 90d supply, fill #0

## 2024-02-15 ENCOUNTER — Other Ambulatory Visit (HOSPITAL_BASED_OUTPATIENT_CLINIC_OR_DEPARTMENT_OTHER): Payer: Self-pay

## 2024-02-17 ENCOUNTER — Other Ambulatory Visit (HOSPITAL_BASED_OUTPATIENT_CLINIC_OR_DEPARTMENT_OTHER): Payer: Self-pay

## 2024-02-17 ENCOUNTER — Other Ambulatory Visit: Payer: Self-pay | Admitting: Family

## 2024-02-17 MED ORDER — LEVOTHYROXINE SODIUM 100 MCG PO TABS
100.0000 ug | ORAL_TABLET | Freq: Every day | ORAL | 0 refills | Status: DC
  Filled 2024-02-17: qty 90, 90d supply, fill #0

## 2024-02-19 ENCOUNTER — Other Ambulatory Visit (HOSPITAL_BASED_OUTPATIENT_CLINIC_OR_DEPARTMENT_OTHER): Payer: Self-pay | Admitting: Family

## 2024-02-19 ENCOUNTER — Ambulatory Visit (INDEPENDENT_AMBULATORY_CARE_PROVIDER_SITE_OTHER): Admitting: Family

## 2024-02-19 VITALS — BP 133/68 | HR 80 | Temp 98.1°F | Resp 16 | Ht 62.0 in | Wt 185.0 lb

## 2024-02-19 DIAGNOSIS — I10 Essential (primary) hypertension: Secondary | ICD-10-CM | POA: Diagnosis not present

## 2024-02-19 DIAGNOSIS — Z1231 Encounter for screening mammogram for malignant neoplasm of breast: Secondary | ICD-10-CM

## 2024-02-19 DIAGNOSIS — R5383 Other fatigue: Secondary | ICD-10-CM | POA: Diagnosis not present

## 2024-02-19 DIAGNOSIS — R609 Edema, unspecified: Secondary | ICD-10-CM | POA: Diagnosis not present

## 2024-02-19 DIAGNOSIS — E538 Deficiency of other specified B group vitamins: Secondary | ICD-10-CM | POA: Diagnosis not present

## 2024-02-19 DIAGNOSIS — D649 Anemia, unspecified: Secondary | ICD-10-CM

## 2024-02-19 DIAGNOSIS — J4520 Mild intermittent asthma, uncomplicated: Secondary | ICD-10-CM | POA: Diagnosis not present

## 2024-02-19 DIAGNOSIS — E119 Type 2 diabetes mellitus without complications: Secondary | ICD-10-CM | POA: Diagnosis not present

## 2024-02-19 DIAGNOSIS — E039 Hypothyroidism, unspecified: Secondary | ICD-10-CM

## 2024-02-19 DIAGNOSIS — R0609 Other forms of dyspnea: Secondary | ICD-10-CM

## 2024-02-19 HISTORY — DX: Edema, unspecified: R60.9

## 2024-02-19 NOTE — Assessment & Plan Note (Addendum)
 Lab Results  Component Value Date   HGBA1C 6.3 (H) 10/13/2023   HGBA1C 6.0 06/19/2023   HGBA1C 6.0 02/16/2023   Lab Results  Component Value Date   MICROALBUR 0.9 10/13/2023   LDLCALC 134 (H) 10/17/2022   CREATININE 0.97 10/13/2023   Diet controlled.

## 2024-02-19 NOTE — Assessment & Plan Note (Signed)
 Lab Results  Component Value Date   WBC 6.4 06/19/2023   HGB 14.2 06/19/2023   HCT 44.1 06/19/2023   MCV 95.1 06/19/2023   PLT 151.0 06/19/2023

## 2024-02-19 NOTE — Assessment & Plan Note (Signed)
 Continues b12 injections once month.

## 2024-02-19 NOTE — Patient Instructions (Signed)
 VISIT SUMMARY:  During your visit, we discussed your severe fatigue, sleep disturbances, leg swelling, and other health concerns. We have made several plans to address these issues, including ordering tests and adjusting your medications.  YOUR PLAN:  SLEEP DISTURBANCE: You have been experiencing sleep disturbances but have recently managed to get about five hours of sleep per night. -Continue with your current sleep routine to maintain at least five hours of sleep per night.  PERIPHERAL EDEMA: You have significant leg swelling, likely due to your medication and circulation issues. -We will order an echocardiogram to assess your heart function and check for fluid around your heart. -Be aware that amlodipine  can cause swelling. We discussed this as a potential side effect.  RHEUMATOID ARTHRITIS: Your fatigue and ankle swelling are limiting your mobility. -Allow your ligament to heal naturally by reducing stress on it.  ASTHMA: You are using albuterol  more frequently, especially at night, and there are concerns about the effectiveness and cost of Trelegy. -Continue using albuterol  as needed. We will monitor the effectiveness of Trelegy.  HYPERTENSION: Your blood pressure is well-controlled with your current medications, but amlodipine  may be causing leg swelling. -We discussed the possibility that amlodipine  is causing your leg swelling.  OSTEOPOROSIS: You have severe bone loss in your mouth and are taking magnesium and collagen supplements as recommended. -Continue taking your magnesium and collagen supplements as recommended.  GENERAL HEALTH MAINTENANCE: You are due for a tetanus vaccination, which is covered by Medicare through the pharmacy. -Please obtain your tetanus vaccination at the pharmacy.  FOLLOW-UP: We need to monitor your progress and perform additional tests. -Schedule a follow-up appointment in three months. -We will order an EKG for you. -Please obtain a mammogram.

## 2024-02-19 NOTE — Addendum Note (Signed)
 Addended by: TRUDY CURVIN RAMAN on: 02/19/2024 02:50 PM   Modules accepted: Orders

## 2024-02-19 NOTE — Assessment & Plan Note (Signed)
 Update TSH, continue synthroid

## 2024-02-19 NOTE — Progress Notes (Signed)
 Subjective:     Patient ID: Sheryl [name], female    DOB: 12/30/1945, 78 y.o.   MRN: [id number]  Chief Complaint  Patient presents with   Fatigue    Patient complains of fatigue   Edema    Patient complains of bilateral leg edema    HPI  Discussed the use of AI scribe software for clinical note transcription with the patient, who gave verbal consent to proceed.  History of Present Illness  Sheryl [name] is a 78 year old female with rheumatoid arthritis who presents with severe fatigue and sleep disturbances. Severe fatigue has persisted for three weeks, more intense than her usual fatigue associated with rheumatoid arthritis. She experiences sleep disturbances, including a period of three days with minimal sleep, and has recently managed to establish a routine allowing for about five hours of sleep per night. Her legs are swollen, particularly when standing, and she uses a home remedy to help reduce the swelling. She uses albuterol  more frequently, especially at night, and takes Trelegy in the morning. She experiences tightness in the upper abdomen and a sensation of being on the verge of passing out, which she does not associate with asthma. Current medications include amlodipine , losartan , and a magnesium supplement, along with monthly B12 shots and a protein drink with collagen.      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   DILATION AND CURETTAGE OF UTERUS  1975   HERNIA REPAIR  10/23/2016   KNEE SURGERY  2006   arthroscopic left knee   KNEE SURGERY  2001   right knee    Family History  Problem Relation Age of Onset   Heart disease Other        CAD   Diabetes Other    Hyperlipidemia Other    Stroke Other     Social History   Socioeconomic History   Marital status: Single    Spouse name: Not on file   Number of children: 1   Years of education: Not on file   Highest education level: Associate degree: academic program  Occupational History   Occupation: unemployed    Employer: DISABLED  Tobacco Use   Smoking status: Former    Current packs/day: 0.00    Types: Cigarettes    Quit date: 04/25/2021    Years since quitting: 2.8   Smokeless tobacco: Never  Tobacco comments:    smokes occasionally but not everyday  Substance and Sexual Activity   Alcohol use: Yes    Alcohol/week: 1.0 standard drink of alcohol    Types: 1 Glasses of wine per week    Comment: occasionally   Drug use: Yes    Comment: Smokes medical grade marijuana 2 puffs 3 x weekly for rheumatoid arthritis.per pt   Sexual activity: Never  Other Topics Concern   Not on file  Social History Narrative   Regular exercise:  Stretching exercises. Resistance bands   Caffeine: 1 mug (2cus) daily.      Social Drivers of Corporate investment banker Strain: Low Risk  (02/17/2024)   Overall Financial Resource Strain (CARDIA)    Difficulty of Paying Living Expenses: Not hard at all  Food Insecurity: No Food Insecurity  (02/17/2024)   Hunger Vital Sign    Worried About Running Out of Food in the Last Year: Never true    Ran Out of Food in the Last Year: Never true  Transportation Needs: No Transportation Needs (02/17/2024)   PRAPARE - Administrator, Civil Service (Medical): No    Lack of Transportation (Non-Medical): No  Physical Activity: Inactive (02/17/2024)   Exercise Vital Sign    Days of Exercise per Week: 0 days    Minutes of Exercise per Session: Not on file  Stress: No Stress Concern Present (02/17/2024)   [employer] of Occupational Health - Occupational Stress Questionnaire    Feeling of Stress: Only a little  Social Connections: Moderately Isolated (02/17/2024)   Social Connection and Isolation Panel    Frequency of Communication with Friends and Family: More than three times a week    Frequency of Social Gatherings with Friends and Family: Once a week    Attends Religious Services: Never    Database administrator or Organizations: Yes    Attends Engineer, structural: More than 4 times per year    Marital Status: Divorced  Intimate Partner Violence: Not At Risk (10/21/2023)   Humiliation, Afraid, Rape, and Kick questionnaire    Fear of Current or Ex-Partner: No    Emotionally Abused: No    Physically Abused: No    Sexually Abused: No    Outpatient Medications Prior to Visit  Medication Sig Dispense Refill   albuterol  (VENTOLIN  HFA) 108 (90 Base) MCG/ACT inhaler Inhale 2 puffs into the lungs every 6 (six) hours as needed for wheezing or shortness of breath. 6.7 g 5   amLODipine  (NORVASC ) 5 MG tablet Take 1 tablet (5 mg total) by mouth daily. 90 tablet 0   aspirin  81 MG tablet Take 81 mg by mouth daily.     clobetasol  cream (TEMOVATE ) 0.05 % Apply to affected areas 2 times a day for 2 weeks on, and 1 week off. Repeat cycle as needed. 60 g 0   Fluticasone -Umeclidin-Vilant (TRELEGY ELLIPTA ) 200-62.5-25 MCG/ACT AEPB Inhale 1 puff into the lungs daily. 180 each 1    ibuprofen  (ADVIL ) 600 MG tablet Take 1 tablet (600 mg total) by mouth every 6-8 hours as needed for pain and swelling. 20 tablet 0   levothyroxine  (SYNTHROID ) 100 MCG tablet Take 1 tablet (100 mcg total) by mouth daily before breakfast. 90 tablet 0   losartan  (COZAAR ) 25 MG tablet Take 1 tablet (25 mg total) by mouth daily. 90 tablet 0   montelukast  (SINGULAIR ) 10 MG tablet Take 1 tablet (10 mg total) by mouth at bedtime. 90 tablet 0  multivitamin (THERAGRAN) per tablet Take 1 tablet by mouth daily.     NON FORMULARY TUMERIC MILK.     NON FORMULARY CBD. Derivative of marijuana without the marijuana.     pantoprazole  (PROTONIX ) 40 MG tablet Take 1 tablet (40 mg total) by mouth daily. 90 tablet 1   Probiotic Product (PROBIOTIC DAILY PO) Take 1 capsule by mouth daily.     VOLTAREN  1 % GEL APPLY 2 GRAMS TOPICALLY 4 TIMES AS NEEDED 100 g 2   No facility-administered medications prior to visit.    Allergies  Allergen Reactions   Levofloxacin Shortness Of Breath and Rash   Mold Extract [Trichophyton] Anaphylaxis   Ativan [Lorazepam]     Visual hallucinations.  Visual hallucinations.    Cefuroxime Axetil     REACTION: asthma, cough   Hydrocodone  Bit-Homatrop Mbr Nausea Only   Lisinopril      Diarrhea hives   Rosuvastatin Calcium     Muscle pain, syncope   Statins     Muscle pain, syncope   Zetia  [Ezetimibe ] Diarrhea   Hydrocodone -Acetaminophen Nausea Only    ROS See HPI    Objective:    Physical Exam Constitutional:      Appearance: She is well-developed.   Cardiovascular:     Rate and Rhythm: Normal rate and regular rhythm.     Heart sounds: Normal heart sounds. No murmur heard. Pulmonary:     Effort: Pulmonary effort is normal. No respiratory distress.     Breath sounds: Normal breath sounds. No wheezing.   Musculoskeletal:        General: Swelling (2-3+ bilateral LE edema) present.   Psychiatric:        Behavior: Behavior normal.        Thought Content: Thought  content normal.        Judgment: Judgment normal.      BP 133/68 (BP Location: Right Arm, Patient Position: Sitting, Cuff Size: Normal)   Pulse 80   Temp 98.1 F (36.7 C) (Oral)   Resp 16   Ht 5' 2 (1.575 m)   Wt 185 lb (83.9 kg)   SpO2 97%   BMI 33.84 kg/m  Wt Readings from Last 3 Encounters:  02/19/24 185 lb (83.9 kg)  10/26/23 187 lb (84.8 kg)  10/13/23 189 lb (85.7 kg)       Assessment & Plan:   Problem List Items Addressed This Visit       Unprioritized   Hypothyroidism   Update TSH, continue synthroid .      Relevant Orders   TSH   Hypertension   BP Readings from Last 3 Encounters:  02/19/24 133/68  10/26/23 (!) 146/68  10/21/23 120/70   BP stable on amlodipine , losartan .      Edema - Primary    Significant leg swelling likely due to amlodipine  and venous stasis.   - Order echocardiogram to assess heart function and check for fluid around the heart (has previous hx of pericarditis) - Educated on potential side effects of amlodipine , including swelling.  - EKG is performed today and compared to previous EKG on file. Notes NSR and appears unchanged.       Diabetes type 2, controlled (HCC)   Lab Results  Component Value Date   HGBA1C 6.3 (H) 10/13/2023   HGBA1C 6.0 06/19/2023   HGBA1C 6.0 02/16/2023   Lab Results  Component Value Date   MICROALBUR 0.9 10/13/2023   LDLCALC 134 (H) 10/17/2022   CREATININE 0.97 10/13/2023  Relevant Orders   Urine Microalbumin w/creat. ratio   B12 deficiency   Continues b12 injections once month.      Asthma   Fair control, on trelegy and prn albuterol .        Anemia   Lab Results  Component Value Date   WBC 6.4 06/19/2023   HGB 14.2 06/19/2023   HCT 44.1 06/19/2023   MCV 95.1 06/19/2023   PLT 151.0 06/19/2023         Other Visit Diagnoses       Fatigue, unspecified type       Relevant Orders   CBC w/Diff   Comp Met (CMET)     DOE (dyspnea on exertion)       Relevant Orders    ECHOCARDIOGRAM COMPLETE   EKG 12-Lead (Completed)       I am having Luv L. Tooker maintain her aspirin , multivitamin, NON FORMULARY, Probiotic Product (PROBIOTIC DAILY PO), NON FORMULARY, Voltaren , ibuprofen , clobetasol  cream, albuterol , pantoprazole , amLODipine , Trelegy Ellipta , montelukast , losartan , and levothyroxine .  No orders of the defined types were placed in this encounter.

## 2024-02-19 NOTE — Assessment & Plan Note (Signed)
 BP Readings from Last 3 Encounters:  02/19/24 133/68  10/26/23 (!) 146/68  10/21/23 120/70   BP stable on amlodipine , losartan .

## 2024-02-19 NOTE — Assessment & Plan Note (Signed)
 Fair control, on trelegy and prn albuterol .

## 2024-02-19 NOTE — Assessment & Plan Note (Signed)
  Significant leg swelling likely due to amlodipine  and venous stasis.   - Order echocardiogram to assess heart function and check for fluid around the heart (has previous hx of pericarditis) - Educated on potential side effects of amlodipine , including swelling.  - EKG is performed today and compared to previous EKG on file. Notes NSR and appears unchanged.

## 2024-02-20 ENCOUNTER — Ambulatory Visit: Payer: Self-pay | Admitting: Family

## 2024-02-20 LAB — CBC WITH DIFFERENTIAL/PLATELET
Absolute Lymphocytes: 2036 {cells}/uL (ref 850–3900)
Absolute Monocytes: 431 {cells}/uL (ref 200–950)
Basophils Absolute: 41 {cells}/uL (ref 0–200)
Basophils Relative: 0.7 %
Eosinophils Absolute: 118 {cells}/uL (ref 15–500)
Eosinophils Relative: 2 %
HCT: 43.3 % (ref 35.0–45.0)
Hemoglobin: 14.5 g/dL (ref 11.7–15.5)
MCH: 30.6 pg (ref 27.0–33.0)
MCHC: 33.5 g/dL (ref 32.0–36.0)
MCV: 91.4 fL (ref 80.0–100.0)
MPV: 10.4 fL (ref 7.5–12.5)
Monocytes Relative: 7.3 %
Neutro Abs: 3275 {cells}/uL (ref 1500–7800)
Neutrophils Relative %: 55.5 %
Platelets: 148 10*3/uL (ref 140–400)
RBC: 4.74 10*6/uL (ref 3.80–5.10)
RDW: 13.4 % (ref 11.0–15.0)
Total Lymphocyte: 34.5 %
WBC: 5.9 10*3/uL (ref 3.8–10.8)

## 2024-02-20 LAB — COMPREHENSIVE METABOLIC PANEL WITH GFR
AG Ratio: 1 (calc) (ref 1.0–2.5)
ALT: 28 U/L (ref 6–29)
AST: 34 U/L (ref 10–35)
Albumin: 3.8 g/dL (ref 3.6–5.1)
Alkaline phosphatase (APISO): 87 U/L (ref 37–153)
BUN: 18 mg/dL (ref 7–25)
CO2: 27 mmol/L (ref 20–32)
Calcium: 9.1 mg/dL (ref 8.6–10.4)
Chloride: 100 mmol/L (ref 98–110)
Creat: 0.96 mg/dL (ref 0.60–1.00)
Globulin: 4 g/dL — ABNORMAL HIGH (ref 1.9–3.7)
Glucose, Bld: 103 mg/dL — ABNORMAL HIGH (ref 65–99)
Potassium: 4.6 mmol/L (ref 3.5–5.3)
Sodium: 134 mmol/L — ABNORMAL LOW (ref 135–146)
Total Bilirubin: 0.5 mg/dL (ref 0.2–1.2)
Total Protein: 7.8 g/dL (ref 6.1–8.1)
eGFR: 61 mL/min/{1.73_m2} (ref >=60)

## 2024-02-20 LAB — HEMOGLOBIN A1C
Hgb A1c MFr Bld: 6 % — ABNORMAL HIGH (ref <5.7)
Mean Plasma Glucose: 126 mg/dL
eAG (mmol/L): 7 mmol/L

## 2024-02-20 LAB — MICROALBUMIN / CREATININE URINE RATIO
Creatinine, Urine: 78 mg/dL (ref 20–275)
Microalb Creat Ratio: 3 mg/g{creat} (ref <30)
Microalb, Ur: 0.2 mg/dL

## 2024-02-20 LAB — TSH: TSH: 1.22 m[IU]/L (ref 0.40–4.50)

## 2024-03-07 ENCOUNTER — Ambulatory Visit

## 2024-03-15 ENCOUNTER — Ambulatory Visit (INDEPENDENT_AMBULATORY_CARE_PROVIDER_SITE_OTHER)

## 2024-03-15 DIAGNOSIS — E538 Deficiency of other specified B group vitamins: Secondary | ICD-10-CM

## 2024-03-15 MED ORDER — CYANOCOBALAMIN 1000 MCG/ML IJ SOLN
1000.0000 ug | Freq: Once | INTRAMUSCULAR | Status: AC
  Administered 2024-03-15: 1000 ug via INTRAMUSCULAR

## 2024-03-15 NOTE — Progress Notes (Signed)
 Pt here for monthly B12 injection per original order dated: 10/13/23  Last B12 injection:02/05/24  Last B12 level:  10/13/23  B12 1000mcg given IM, and pt tolerated injection well.  Next B12 injection scheduled for:  next month

## 2024-03-23 ENCOUNTER — Other Ambulatory Visit (HOSPITAL_BASED_OUTPATIENT_CLINIC_OR_DEPARTMENT_OTHER): Payer: Self-pay

## 2024-03-28 ENCOUNTER — Ambulatory Visit (HOSPITAL_BASED_OUTPATIENT_CLINIC_OR_DEPARTMENT_OTHER)

## 2024-03-29 ENCOUNTER — Ambulatory Visit (HOSPITAL_BASED_OUTPATIENT_CLINIC_OR_DEPARTMENT_OTHER)

## 2024-04-04 ENCOUNTER — Other Ambulatory Visit (HOSPITAL_BASED_OUTPATIENT_CLINIC_OR_DEPARTMENT_OTHER): Payer: Self-pay

## 2024-04-04 ENCOUNTER — Other Ambulatory Visit: Payer: Self-pay | Admitting: Family

## 2024-04-04 DIAGNOSIS — I10 Essential (primary) hypertension: Secondary | ICD-10-CM

## 2024-04-04 MED ORDER — AMLODIPINE BESYLATE 5 MG PO TABS
5.0000 mg | ORAL_TABLET | Freq: Every day | ORAL | 0 refills | Status: DC
  Filled 2024-04-04: qty 90, 90d supply, fill #0

## 2024-04-06 ENCOUNTER — Ambulatory Visit (HOSPITAL_BASED_OUTPATIENT_CLINIC_OR_DEPARTMENT_OTHER)
Admission: RE | Admit: 2024-04-06 | Discharge: 2024-04-06 | Disposition: A | Discharge status: Home / Self Care | Source: Ambulatory Visit | Attending: Family | Admitting: Family

## 2024-04-06 ENCOUNTER — Encounter (HOSPITAL_BASED_OUTPATIENT_CLINIC_OR_DEPARTMENT_OTHER): Payer: Self-pay

## 2024-04-06 DIAGNOSIS — Z1231 Encounter for screening mammogram for malignant neoplasm of breast: Secondary | ICD-10-CM | POA: Diagnosis not present

## 2024-04-07 ENCOUNTER — Ambulatory Visit: Payer: Self-pay | Admitting: Family

## 2024-05-03 ENCOUNTER — Telehealth: Payer: Self-pay

## 2024-05-03 DIAGNOSIS — R0609 Other forms of dyspnea: Secondary | ICD-10-CM

## 2024-05-03 NOTE — Telephone Encounter (Signed)
 I believe Pt will need a new order for echo.

## 2024-05-03 NOTE — Telephone Encounter (Signed)
 Copied from CRM #[id number]. Topic: Appointments - Scheduling Inquiry for Clinic >> Apr 26, 2024  1:54 PM Sheryl [name] wrote: Reason for CRM: patient needing to schedule again for an ECHOCARDIOGRAM  She had to cancel the last one due to family coming in town

## 2024-05-04 ENCOUNTER — Ambulatory Visit

## 2024-05-04 NOTE — Telephone Encounter (Signed)
 Order has been placed.

## 2024-05-17 ENCOUNTER — Other Ambulatory Visit: Payer: Self-pay | Admitting: Family

## 2024-05-17 ENCOUNTER — Other Ambulatory Visit (HOSPITAL_BASED_OUTPATIENT_CLINIC_OR_DEPARTMENT_OTHER): Payer: Self-pay

## 2024-05-17 MED ORDER — MONTELUKAST SODIUM 10 MG PO TABS
10.0000 mg | ORAL_TABLET | Freq: Every day | ORAL | 0 refills | Status: DC
  Filled 2024-05-17: qty 90, 90d supply, fill #0

## 2024-05-17 MED ORDER — LEVOTHYROXINE SODIUM 100 MCG PO TABS
100.0000 ug | ORAL_TABLET | Freq: Every day | ORAL | 0 refills | Status: DC
  Filled 2024-05-17: qty 90, 90d supply, fill #0

## 2024-05-17 MED ORDER — PANTOPRAZOLE SODIUM 40 MG PO TBEC
40.0000 mg | DELAYED_RELEASE_TABLET | Freq: Every day | ORAL | 1 refills | Status: DC
  Filled 2024-05-17: qty 90, 90d supply, fill #0
  Filled 2024-08-11: qty 90, 90d supply, fill #1

## 2024-05-17 MED ORDER — LOSARTAN POTASSIUM 25 MG PO TABS
25.0000 mg | ORAL_TABLET | Freq: Every day | ORAL | 0 refills | Status: DC
  Filled 2024-05-17: qty 90, 90d supply, fill #0

## 2024-05-20 ENCOUNTER — Other Ambulatory Visit (HOSPITAL_BASED_OUTPATIENT_CLINIC_OR_DEPARTMENT_OTHER): Payer: Self-pay

## 2024-05-20 ENCOUNTER — Ambulatory Visit: Admitting: Family

## 2024-05-20 VITALS — BP 139/58 | HR 81 | Temp 98.7°F | Ht 62.0 in | Wt 183.0 lb

## 2024-05-20 DIAGNOSIS — J309 Allergic rhinitis, unspecified: Secondary | ICD-10-CM

## 2024-05-20 DIAGNOSIS — R296 Repeated falls: Secondary | ICD-10-CM | POA: Diagnosis not present

## 2024-05-20 DIAGNOSIS — E119 Type 2 diabetes mellitus without complications: Secondary | ICD-10-CM

## 2024-05-20 DIAGNOSIS — E039 Hypothyroidism, unspecified: Secondary | ICD-10-CM

## 2024-05-20 DIAGNOSIS — M069 Rheumatoid arthritis, unspecified: Secondary | ICD-10-CM | POA: Diagnosis not present

## 2024-05-20 DIAGNOSIS — E538 Deficiency of other specified B group vitamins: Secondary | ICD-10-CM

## 2024-05-20 DIAGNOSIS — Z23 Encounter for immunization: Secondary | ICD-10-CM

## 2024-05-20 LAB — LIPID PANEL
Cholesterol: 193 mg/dL (ref 0–200)
HDL: 31.6 mg/dL — ABNORMAL LOW (ref >39.00)
LDL Cholesterol: 125 mg/dL — ABNORMAL HIGH (ref 0–99)
NonHDL: 161.3
Total CHOL/HDL Ratio: 6
Triglycerides: 180 mg/dL — ABNORMAL HIGH (ref 0.0–149.0)
VLDL: 36 mg/dL (ref 0.0–40.0)

## 2024-05-20 LAB — BASIC METABOLIC PANEL WITH GFR
BUN: 15 mg/dL (ref 6–23)
CO2: 29 meq/L (ref 19–32)
Calcium: 8.9 mg/dL (ref 8.4–10.5)
Chloride: 100 meq/L (ref 96–112)
Creatinine, Ser: 0.98 mg/dL (ref 0.40–1.20)
GFR: 55.9 mL/min — ABNORMAL LOW (ref >60.00)
Glucose, Bld: 111 mg/dL — ABNORMAL HIGH (ref 70–99)
Potassium: 4.9 meq/L (ref 3.5–5.1)
Sodium: 135 meq/L (ref 135–145)

## 2024-05-20 LAB — HEMOGLOBIN A1C: Hgb A1c MFr Bld: 6.4 % (ref 4.6–6.5)

## 2024-05-20 MED ORDER — COMIRNATY 30 MCG/0.3ML IM SUSY
0.3000 mL | PREFILLED_SYRINGE | Freq: Once | INTRAMUSCULAR | 0 refills | Status: AC
  Filled 2024-05-20: qty 0.3, 1d supply, fill #0

## 2024-05-20 MED ORDER — CYANOCOBALAMIN 1000 MCG/ML IJ SOLN
1000.0000 ug | Freq: Once | INTRAMUSCULAR | Status: AC
  Administered 2024-05-20: 1000 ug via INTRAMUSCULAR

## 2024-05-20 NOTE — Assessment & Plan Note (Signed)
 Declines medication therapy. Overall stable.

## 2024-05-20 NOTE — Patient Instructions (Signed)
 VISIT SUMMARY:  Today, we discussed your arthritis flare-up, balance issues, and other ongoing health concerns. We also reviewed your medications and made plans for physical therapy and vaccinations.  YOUR PLAN:  IMPAIRED BALANCE AND UNSTEADINESS: You have been experiencing balance issues and unsteadiness, especially after falls. -We will refer you to physical therapy for gait and balance training.  OSTEOARTHRITIS: You have swelling and discomfort in your ankle likely due to osteoarthritis. -Continue with the exercises that provide relief.  TYPE 2 DIABETES MELLITUS: Your A1c has improved to 6.0 from 6.3. You are mindful of your diet and practice intermittent fasting. -We will recheck your A1c today.  CHRONIC OBSTRUCTIVE PULMONARY DISEASE (COPD): You experience occasional shortness of breath. -Continue using your current inhaler regimen.  HYPERTENSION: Your blood pressure is managed with losartan . -Continue taking losartan  as prescribed.  HYPOTHYROIDISM: Your thyroid  condition is stable with levothyroxine  100 mcg. -Continue taking levothyroxine  as prescribed.  OBESITY: You have lost 23 pounds since COVID and report improved symptoms. -Continue practicing intermittent fasting.  ALLERGIC RHINITIS: You experience eye irritation and use Zyrtec and Flonase  as needed. -Continue using Zyrtec and Flonase  as needed.  DEPRESSION: You feel depressed due to concerns about the world. -Try to avoid distressing topics.  GENERAL HEALTH MAINTENANCE: We discussed your vaccinations and supplements. -We will administer the flu shot today. -Please get your COVID-19 booster at the pharmacy. -You are overdue for B12 supplementation.

## 2024-05-20 NOTE — Assessment & Plan Note (Signed)
 Stable on zyrtec flonase  and singulair .

## 2024-05-20 NOTE — Assessment & Plan Note (Signed)
 B12 shot today.

## 2024-05-20 NOTE — Assessment & Plan Note (Signed)
 Lab Results  Component Value Date   HGBA1C 6.0 (H) 02/19/2024   HGBA1C 6.3 (H) 10/13/2023   HGBA1C 6.0 06/19/2023   Lab Results  Component Value Date   MICROALBUR 0.2 02/19/2024   LDLCALC 134 (H) 10/17/2022   CREATININE 0.96 02/19/2024  Continues to work on diet.

## 2024-05-20 NOTE — Assessment & Plan Note (Signed)
 Lab Results  Component Value Date   TSH 1.22 02/19/2024   Clinically stable on synthroid .

## 2024-05-20 NOTE — Progress Notes (Signed)
 Subjective:     Patient ID: Sheryl [name], female    DOB: 17-May-1946, 78 y.o.   MRN: [id number]  Chief Complaint  Patient presents with   Hypertension    Here for follow up   Asthma     Having a flare up with allergy season     HPI  Discussed the use of AI scribe software for clinical note transcription with the patient, who gave verbal consent to proceed.  History of Present Illness  Sheryl [name] is a 78 year old female who presents for a routine follow-up visit.  She experiences a flare of arthritis with swelling and discomfort in her ankle. Exercises such as rotating her toes and pumping her feet provide some relief. She has balance issues and unsteadiness, particularly after falls, and has been cautious while walking. Exercises found online have not significantly improved her balance. She takes levothyroxine  100 mcg, losartan , a generic for Singulair , Protonix , Zyrtec, and occasionally Flonase . She uses Vicks for nasal relief and mentions her eyes feel red and tired. Her thyroid  condition is stable with levothyroxine . She notes a liver spot on her leg that has changed, feeling like it has a scab.    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   DILATION AND CURETTAGE OF UTERUS  1975   HERNIA REPAIR  10/23/2016   KNEE SURGERY  2006   arthroscopic left knee   KNEE SURGERY  2001   right knee    Family History  Problem Relation Age of Onset   Heart disease Other        CAD   Diabetes Other    Hyperlipidemia Other    Stroke Other     Social History   Socioeconomic History   Marital status: Single    Spouse name: Not on file   Number of children: 1   Years of education: Not on file   Highest education level: Associate degree: academic program  Occupational History   Occupation: unemployed    Employer: DISABLED  Tobacco Use   Smoking status: Former    Current packs/day: 0.00    Types: Cigarettes    Quit date: 04/25/2021    Years since quitting: 3.0   Smokeless tobacco: Never   Tobacco comments:    smokes occasionally but not everyday  Substance and Sexual Activity   Alcohol use: Yes    Alcohol/week: 1.0 standard drink of alcohol    Types: 1 Glasses  of wine per week    Comment: occasionally   Drug use: Yes    Comment: Smokes medical grade marijuana 2 puffs 3 x weekly for rheumatoid arthritis.per pt   Sexual activity: Never  Other Topics Concern   Not on file  Social History Narrative   Regular exercise:  Stretching exercises. Resistance bands   Caffeine: 1 mug (2cus) daily.      Social Drivers of Corporate investment banker Strain: Low Risk  (02/17/2024)   Overall Financial Resource Strain (CARDIA)    Difficulty of Paying Living Expenses: Not hard at all  Food Insecurity: No Food Insecurity (02/17/2024)   Hunger Vital Sign    Worried About Running Out of Food in the Last Year: Never true    Ran Out of Food in the Last Year: Never true  Transportation Needs: No Transportation Needs (02/17/2024)   PRAPARE - Administrator, Civil Service  (Medical): No    Lack of Transportation (Non-Medical): No  Physical Activity: Inactive (02/17/2024)   Exercise Vital Sign    Days of Exercise per Week: 0 days    Minutes of Exercise per Session: Not on file  Stress: No Stress Concern Present (02/17/2024)   [employer] of Occupational Health - Occupational Stress Questionnaire    Feeling of Stress: Only a little  Social Connections: Moderately Isolated (02/17/2024)   Social Connection and Isolation Panel    Frequency of Communication with Friends and Family: More than three times a week    Frequency of Social Gatherings with Friends and Family: Once a week    Attends Religious Services: Never    Database administrator or Organizations: Yes    Attends Engineer, structural: More than 4 times per year    Marital Status: Divorced  Intimate Partner Violence: Not At Risk (10/21/2023)   Humiliation, Afraid, Rape, and Kick questionnaire    Fear of Current or Ex-Partner: No    Emotionally Abused: No    Physically Abused: No    Sexually Abused: No    Outpatient Medications Prior to Visit  Medication Sig Dispense Refill   albuterol  (VENTOLIN  HFA) 108 (90 Base) MCG/ACT inhaler Inhale 2 puffs into the lungs every 6 (six) hours as needed for wheezing or shortness of breath. 6.7 g 5   amLODipine  (NORVASC ) 5 MG tablet Take 1 tablet (5 mg total) by mouth daily. 90 tablet 0   aspirin  81 MG tablet Take 81 mg by mouth daily.     clobetasol  cream (TEMOVATE ) 0.05 % Apply to affected areas 2 times a day for 2 weeks on, and 1 week off. Repeat cycle as needed. 60 g 0   Fluticasone -Umeclidin-Vilant (TRELEGY ELLIPTA ) 200-62.5-25 MCG/ACT AEPB Inhale 1 puff into the lungs daily. 180 each 1   ibuprofen  (ADVIL ) 600 MG tablet Take 1 tablet (600 mg total) by mouth every 6-8 hours as needed for pain and swelling. 20 tablet 0   levothyroxine  (SYNTHROID ) 100 MCG tablet Take 1 tablet (100 mcg total) by mouth daily before breakfast. 90 tablet 0   losartan   (COZAAR ) 25 MG tablet Take 1 tablet (25 mg total) by mouth daily. 90 tablet 0   montelukast  (SINGULAIR ) 10 MG tablet Take 1 tablet (10 mg total) by mouth at bedtime. 90 tablet 0   multivitamin (THERAGRAN) per tablet Take 1 tablet by mouth daily.     NON FORMULARY TUMERIC MILK.     NON FORMULARY CBD. Derivative of marijuana without the marijuana.  pantoprazole  (PROTONIX ) 40 MG tablet Take 1 tablet (40 mg total) by mouth daily. 90 tablet 1   Probiotic Product (PROBIOTIC DAILY PO) Take 1 capsule by mouth daily.     VOLTAREN  1 % GEL APPLY 2 GRAMS TOPICALLY 4 TIMES AS NEEDED 100 g 2   No facility-administered medications prior to visit.    Allergies  Allergen Reactions   Levofloxacin Shortness Of Breath and Rash   Mold Extract [Trichophyton] Anaphylaxis   Ativan [Lorazepam]     Visual hallucinations.  Visual hallucinations.    Cefuroxime Axetil     REACTION: asthma, cough   Hydrocodone  Bit-Homatrop Mbr Nausea Only   Lisinopril      Diarrhea hives   Rosuvastatin Calcium     Muscle pain, syncope   Statins     Muscle pain, syncope   Zetia  [Ezetimibe ] Diarrhea   Hydrocodone -Acetaminophen Nausea Only    ROS See HPI    Objective:    Physical Exam Constitutional:      General: She is not in acute distress.    Appearance: Normal appearance. She is well-developed.  HENT:     Head: Normocephalic and atraumatic.     Right Ear: External ear normal.     Left Ear: External ear normal.  Eyes:     General: No scleral icterus. Neck:     Thyroid : No thyromegaly.  Cardiovascular:     Rate and Rhythm: Normal rate and regular rhythm.     Heart sounds: Normal heart sounds. No murmur heard. Pulmonary:     Effort: Pulmonary effort is normal. No respiratory distress.     Breath sounds: Normal breath sounds. No wheezing.  Musculoskeletal:     Cervical back: Neck supple.  Skin:    General: Skin is warm and dry.  Neurological:     Mental Status: She is alert and oriented to person,  place, and time.  Psychiatric:        Mood and Affect: Mood normal.        Behavior: Behavior normal.        Thought Content: Thought content normal.        Judgment: Judgment normal.      BP (!) 139/58 (BP Location: Right Arm, Patient Position: Sitting, Cuff Size: Normal)   Pulse 81   Temp 98.7 F (37.1 C) (Oral)   Ht 5' 2 (1.575 m)   Wt 183 lb (83 kg)   HC 16 (40.6 cm)   SpO2 99%   BMI 33.47 kg/m  Wt Readings from Last 3 Encounters:  05/20/24 183 lb (83 kg)  02/19/24 185 lb (83.9 kg)  10/26/23 187 lb (84.8 kg)       Assessment & Plan:   Problem List Items Addressed This Visit       Unprioritized   Rheumatoid arthritis (HCC)   Declines medication therapy. Overall stable.       Hypothyroidism   Lab Results  Component Value Date   TSH 1.22 02/19/2024   Clinically stable on synthroid .       Diabetes type 2, controlled (HCC) - Primary   Lab Results  Component Value Date   HGBA1C 6.0 (H) 02/19/2024   HGBA1C 6.3 (H) 10/13/2023   HGBA1C 6.0 06/19/2023   Lab Results  Component Value Date   MICROALBUR 0.2 02/19/2024   LDLCALC 134 (H) 10/17/2022   CREATININE 0.96 02/19/2024  Continues to work on diet.        Relevant Orders   HgB A1c (Completed)   Basic  Metabolic Panel (BMET) (Completed)   Lipid panel (Completed)   B12 deficiency   B12 shot today.      Allergic rhinitis   Stable on zyrtec flonase  and singulair .      Other Visit Diagnoses       Needs flu shot       Relevant Orders   Flu vaccine HIGH DOSE PF(Fluzone Trivalent) (Completed)     Falls       Relevant Orders   Ambulatory referral to Physical Therapy       I am having Chalisa L. Bise maintain her aspirin , multivitamin, NON FORMULARY, Probiotic Product (PROBIOTIC DAILY PO), NON FORMULARY, Voltaren , ibuprofen , clobetasol  cream, albuterol , Trelegy Ellipta , amLODipine , pantoprazole , montelukast , losartan , and levothyroxine . We administered cyanocobalamin .  Meds ordered this  encounter  Medications   cyanocobalamin  (VITAMIN B12) injection 1,000 mcg

## 2024-05-20 NOTE — Assessment & Plan Note (Signed)
 BP Readings from Last 3 Encounters:  05/20/24 (!) 139/58  02/19/24 133/68  10/26/23 (!) 146/68   BP stable on amlodipine , losartan .

## 2024-05-23 ENCOUNTER — Ambulatory Visit: Payer: Self-pay | Admitting: Family

## 2024-06-01 ENCOUNTER — Ambulatory Visit (HOSPITAL_BASED_OUTPATIENT_CLINIC_OR_DEPARTMENT_OTHER)
Admission: RE | Admit: 2024-06-01 | Discharge: 2024-06-01 | Disposition: A | Discharge status: Home / Self Care | Source: Ambulatory Visit | Attending: Family | Admitting: Family

## 2024-06-01 DIAGNOSIS — R0609 Other forms of dyspnea: Secondary | ICD-10-CM | POA: Diagnosis not present

## 2024-06-01 LAB — ECHOCARDIOGRAM COMPLETE
AR max vel: 1.52 cm2
AV Area VTI: 1.37 cm2
AV Area mean vel: 1.34 cm2
AV Mean grad: 7.5 mmHg
AV Peak grad: 13 mmHg
AV Vena cont: 0.3 cm
Ao pk vel: 1.8 m/s
Area-P 1/2: 1.86 cm2
Calc EF: 62.9 %
MV M vel: 2.8 m/s
MV Peak grad: 31.4 mmHg
MV VTI: 1.26 cm2
S' Lateral: 2.9 cm
Single Plane A2C EF: 63.4 %
Single Plane A4C EF: 63.7 %

## 2024-06-02 ENCOUNTER — Ambulatory Visit: Payer: Self-pay | Admitting: Family

## 2024-06-02 DIAGNOSIS — I341 Nonrheumatic mitral (valve) prolapse: Secondary | ICD-10-CM | POA: Insufficient documentation

## 2024-06-02 HISTORY — DX: Nonrheumatic mitral (valve) prolapse: I34.1

## 2024-06-02 NOTE — Telephone Encounter (Signed)
 Please let pt know that echo shows that her mitral valve is degenerated which is not functioning properly.  This could be why she is more short of breath. I would like to refer her to cardiology for further evaluation.

## 2024-06-07 ENCOUNTER — Ambulatory Visit: Attending: Family

## 2024-06-07 ENCOUNTER — Other Ambulatory Visit: Payer: Self-pay

## 2024-06-07 DIAGNOSIS — R296 Repeated falls: Secondary | ICD-10-CM | POA: Diagnosis not present

## 2024-06-07 DIAGNOSIS — R262 Difficulty in walking, not elsewhere classified: Secondary | ICD-10-CM | POA: Insufficient documentation

## 2024-06-07 NOTE — Therapy (Signed)
 OUTPATIENT PHYSICAL THERAPY LOWER EXTREMITY EVALUATION   Patient Name: Sheryl [name] MRN: [id number] DOB:11-23-1945, 78 y.o., female Today's Date: 06/07/2024  END OF SESSION:  PT End of Session - 06/07/24 1103     Visit Number 1    Date for Recertification  08/30/24    PT Start Time 1102    PT Stop Time 1142    PT Time Calculation (min) 40 min    Activity Tolerance Patient tolerated treatment well    Behavior During Therapy [facility] for tasks assessed/performed          Past Medical History:  Diagnosis Date   Anemia    pernicious   Aortic ectasia    infrarenal abdomina aorta 2.6 cm   Asthma    Calculus of gallbladder 09/13/2018   Colon cancer (HCC) 2012   Diabetes mellitus    type 2   Fatty liver 01/19/2015   Fatty liver    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   DILATION AND CURETTAGE OF UTERUS  1975   HERNIA REPAIR  10/23/2016   KNEE SURGERY  2006   arthroscopic left knee   KNEE SURGERY  2001   right knee   Patient Active Problem List   Diagnosis Date Noted   Degenerative mitral valve prolapse 06/02/2024   Edema 02/19/2024   Sprain of right ankle 10/13/2023   Right ankle swelling 10/17/2022   History of COVID-19 05/09/2022   B12 deficiency 05/17/2021   Adverse reaction to COVID-19 vaccine 02/15/2021   Ventral hernia 09/13/2018   Atherosclerotic vascular disease 09/13/2018   Overweight 09/13/2018   Centrilobular emphysema (HCC)  01/29/2017   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   Anemia 06/04/2016   Ectatic aorta 03/03/2016   Fatty liver 01/19/2015   Vaginal atrophy 01/08/2015   Colon cancer (HCC) 05/26/2012   Psoriasis 12/29/2011   Sjogren's syndrome 03/17/2011   Insomnia 12/25/2010   General medical examination 12/25/2010   Vitamin D  deficiency 05/15/2010   Asthma 12/26/2009   Diabetes type 2, controlled (HCC) 09/10/2009   Depression 09/10/2009   Allergic rhinitis 09/10/2009   Hypertension 09/10/2009   Hypothyroidism 06/05/2009   PERNICIOUS ANEMIA 06/05/2009   History of tobacco abuse 06/05/2009   GERD 06/05/2009   Rheumatoid arthritis (HCC) 06/05/2009   Mixed dyslipidemia 06/05/2009    PCP: Daryl Setter, NP  REFERRING PROVIDER: same  REFERRING DIAG: h/o falls, DM, new dx of mitral valve dysfunction  THERAPY DIAG:  Difficulty in walking, not elsewhere classified - Plan: PT plan of care cert/re-cert  Rationale for Evaluation and Treatment: Rehabilitation  ONSET DATE: 05/20/24: date of referral  SUBJECTIVE:   SUBJECTIVE STATEMENT: Reports B ankles roll over due to her rheumatoid arthritis, especially her R, she feels like its not really muscle weakness, more joint stability particularly with rheumatoid flares.  No longer taking the rheumatoid medication due to all of the negative side effects.  Has flares that last 3 to 4 days, occurs  approximately once a month  PERTINENT HISTORY: referred by PCP due to falls   PAIN:  Are you having pain? Yes: NPRS scale: 3 to 9 Pain location: multiple jts  Pain description: constant dull ache, some sharp pains R ankle and R lateral lower leg Aggravating factors: more with RA flares, and with excessive walking Relieving factors: resting, uses brace as needed  PRECAUTIONS: Fall  RED FLAGS: None   WEIGHT BEARING RESTRICTIONS: No  FALLS:  Has patient fallen in last 6 months? Yes. Number of falls 3  LIVING ENVIRONMENT: Lives with: lives  alone Lives in: House/apartment Stairs: No Has following equipment at home: Single point cane and Environmental consultant - 4 wheeled  OCCUPATION: retired  PLOF: Independent with basic ADLs, Independent with household mobility with device, and Independent with household mobility without device  PATIENT GOALS: prevent more falls  NEXT MD VISIT: 11/19/23  OBJECTIVE:  Note: Objective measures were completed at Evaluation unless otherwise noted.  DIAGNOSTIC FINDINGS: na  PATIENT SURVEYS:  PSFS: THE PATIENT SPECIFIC FUNCTIONAL SCALE  Place score of 0-10 (0 = unable to perform activity and 10 = able to perform activity at the same level as before injury or problem)  Activity Date: 06/07/24    Walking in her yard/ garden 0    2. Walking through grocery store 0    3.driving 0    4.      Total Score 0      Total Score = Sum of activity scores/number of activities  Minimally Detectable Change: 3 points (for single activity); 2 points (for average score)  [facility] (nd). The Patient Specific Functional Scale . Retrieved from [email or web address]   COGNITION: Overall cognitive status: Within functional limits for tasks assessed     SENSATION: Reports neuropathy B feet  EDEMA:  Noted prominent edema R lateral lower leg , mild B ankles  MUSCLE LENGTH: Hamstrings: Right wfl deg; Left wfl deg Debby test: Right wfl deg; Left wfl deg  POSTURE: numerous arthritic changes B hands, knees, ankles with loss/collapse of arch, valgum deformity  PALPATION: Tender with light touch throughout R lower leg  LOWER EXTREMITY MNF:hmnddob wfl except B ankles limited to 50% plantarflexion  LOWER EXTREMITY MMT:  MMT Right eval Left eval  Hip flexion 4 4  Hip extension    Hip abduction 4 4  Hip adduction    Hip internal rotation    Hip external rotation    Knee flexion 4-   Knee extension 4-   Ankle dorsiflexion 3- 3+  Ankle  plantarflexion 2+ 2+  Ankle inversion 2+ 2+  Ankle eversion     (Blank rows = not tested)  LOWER EXTREMITY SPECIAL TESTS:  na  FUNCTIONAL TESTS:  5 times sit to stand: unable due to pain R ankle, LE's   GAIT: Distance walked: in clinic, pt without device, she requested assistance with gait with st cane so instructed her and she was able to walk short distances with st cnae, more comfortable with cane in R hand although R ankle more unstable  TREATMENT DATE: 06/07/24:  Eval, POC discussion Did recommend possible heel cup for each shoe to provide more stability calcaneus B , pt demonstrated the wrap for her R ankle , she is limited in her foot wear due to frequent fluctuating edema with her RA   PATIENT EDUCATION:  Education details: POC, goals Person educated: Patient Education method: Explanation, Demonstration, Tactile cues, and Verbal cues Education comprehension: verbalized understanding, returned demonstration, verbal cues required, tactile cues required, and needs further education  HOME EXERCISE PROGRAM: Access Code: [email or web address] URL: [email or web address] Date: 06/07/2024 Prepared by: Ledia Hanford  Exercises - Isometric Ankle Inversion at Wall  - 1 x daily - 7 x weekly - 1 sets - 10 reps - Ankle and Toe Plantarflexion with Resistance  - 1 x daily - 7 x weekly - 1 sets - 10 reps - Seated Long Arc Quad  - 1 x daily - 3 x weekly - 1 sets - 10 reps  ASSESSMENT:  CLINICAL IMPRESSION: Patient is a 78 y.o. female who was evaluated today by physical therapy due to frequent falls, referred by her PCP. She is limited with her strength and stability primarily due to weakness and changes in structure B ankles.  She reports rheumatoid flares about once a month which cause her to have to be incapacitated for about 3-4 days .  Reports that her ankles are  more unstable during those flares, and then she has more falls.  In discussion with her she is quite limited in her ability to exit the house, and tolerate the time to attend today's appt, fatigues, has to have someone drive her due to her ankle pain/dysfunction.  We discussed home health PT, she does qualify as home bound and she should benefit from someone working with her in her home environment to assist her with a home safety evaluation as well. Contacted the referring provider, Eleanor Ponto to request order sent to home health care.   OBJECTIVE IMPAIRMENTS: cardiopulmonary status limiting activity, decreased activity tolerance, decreased balance, decreased endurance, decreased knowledge of condition, decreased mobility, difficulty walking, decreased ROM, decreased strength, increased edema, impaired perceived functional ability, improper body mechanics, postural dysfunction, and pain.   ACTIVITY LIMITATIONS: carrying, lifting, standing, squatting, stairs, transfers, bathing, dressing, hygiene/grooming, locomotion level, and caring for others  PARTICIPATION LIMITATIONS: meal prep, cleaning, laundry, driving, shopping, community activity, and yard work  PERSONAL FACTORS: Age, Fitness, Past/current experiences, Time since onset of injury/illness/exacerbation, Transportation, and 1-2 comorbidities: RA, mitral valve dysfunction are also affecting patient's functional outcome.   REHAB POTENTIAL: na  CLINICAL DECISION MAKING: Evolving/moderate complexity  EVALUATION COMPLEXITY: Moderate   GOALS: Goals reviewed with patient? Yes  SHORT TERM GOALS: Target date: 06/07/24 I HEP as instructed today Baseline: Goal status: INITIAL    PLAN:  PT FREQUENCY: one time visit  PT DURATION: 1 sessions  PLANNED INTERVENTIONS: 97110-Therapeutic exercises, 97530- Therapeutic activity, [id number]- Neuromuscular re-education, 97535- Self Care, [id number]- Manual therapy, and Patient/Family education  PLAN  FOR NEXT SESSION: recommend home health PT instead of outpt   Melva Faux L Alexander Mcauley, PT, DPT, OCS 06/07/2024, 2:57 PM

## 2024-06-08 ENCOUNTER — Other Ambulatory Visit: Payer: Self-pay | Admitting: Family

## 2024-06-08 ENCOUNTER — Other Ambulatory Visit (HOSPITAL_BASED_OUTPATIENT_CLINIC_OR_DEPARTMENT_OTHER): Payer: Self-pay

## 2024-06-08 MED ORDER — TRELEGY ELLIPTA 200-62.5-25 MCG/ACT IN AEPB
1.0000 | INHALATION_SPRAY | Freq: Every day | RESPIRATORY_TRACT | 1 refills | Status: DC
  Filled 2024-06-08: qty 180, 90d supply, fill #0
  Filled 2024-08-07 – 2024-08-15 (×2): qty 180, 90d supply, fill #1

## 2024-06-09 ENCOUNTER — Telehealth: Payer: Self-pay | Admitting: Family

## 2024-06-09 DIAGNOSIS — R296 Repeated falls: Secondary | ICD-10-CM

## 2024-06-09 NOTE — Telephone Encounter (Signed)
-----   Message from Greig CROME Speaks sent at 06/07/2024  2:46 PM EDT ----- Erskin, Sheryl Daryl,   I evaluated Sheryl [name] today for physical therapy due to her falls history.  She does have many deficits which contribute to her fall risk.  In discussion with her, she would have a difficult time attended outpatient PT consistently she was quite taxed today to make it to this appt, has to rely on her house keeper to drive her, and typically only goes out for her doctor appts.  We discussed home health PT, and if it is possible she would like to pursue home health care, for home safety eval, and to work with her in her home environment.  Thanks.   Amy

## 2024-06-10 ENCOUNTER — Telehealth: Payer: Self-pay

## 2024-06-10 NOTE — Telephone Encounter (Signed)
 Copied from CRM [id number]. Topic: Clinical - Medical Advice >> Jun 10, 2024  8:32 AM Logan F wrote: Reason for CRM: Jon from [facility] is calling to inform office that they will be seeing pt for PT starting Monday 10/20 instead of today. Per pt request     [phone]

## 2024-06-10 NOTE — Telephone Encounter (Signed)
 Noted

## 2024-06-13 ENCOUNTER — Telehealth: Payer: Self-pay

## 2024-06-13 NOTE — Telephone Encounter (Signed)
 Okay to give verbal orders?  ?

## 2024-06-13 NOTE — Telephone Encounter (Signed)
 Copied from CRM [id number]. Topic: Clinical - Home Health Verbal Orders >> Jun 13, 2024  3:04 PM Ashley SAUNDERS wrote: Caller/Agency: [facility]  Callback Number: [phone] Confidential VM Service Requested: Physical Therapy Frequency: 1x week for 9 weeks Any new concerns about the patient? No

## 2024-06-14 NOTE — Progress Notes (Unsigned)
 Patient is here for a vitamin B12 injection per orders from Eleanor Ponto, NP:  05/20/2024: Return in about 6 months (around 11/17/2024) for follow up visit, 1 month for b12 visit.  Last injection: 05/20/2024. Denies gastrointestinal problems or dizziness.  B12 injection to left deltoid with no apparent complications.  Scheduled next injection in one month: 07/20/2024.

## 2024-06-14 NOTE — Telephone Encounter (Signed)
 Yes please

## 2024-06-14 NOTE — Telephone Encounter (Signed)
 Phone # is [phone] LVM with verbal orders. Per pcp ok to start PT

## 2024-06-15 ENCOUNTER — Ambulatory Visit (INDEPENDENT_AMBULATORY_CARE_PROVIDER_SITE_OTHER): Admitting: Neurology

## 2024-06-15 DIAGNOSIS — E538 Deficiency of other specified B group vitamins: Secondary | ICD-10-CM

## 2024-06-15 MED ORDER — CYANOCOBALAMIN 1000 MCG/ML IJ SOLN
1000.0000 ug | Freq: Once | INTRAMUSCULAR | Status: AC
  Administered 2024-06-15: 1000 ug via INTRAMUSCULAR

## 2024-06-28 ENCOUNTER — Other Ambulatory Visit: Payer: Self-pay

## 2024-06-28 ENCOUNTER — Other Ambulatory Visit: Payer: Self-pay | Admitting: Family

## 2024-06-28 ENCOUNTER — Other Ambulatory Visit (HOSPITAL_BASED_OUTPATIENT_CLINIC_OR_DEPARTMENT_OTHER): Payer: Self-pay

## 2024-06-28 DIAGNOSIS — I10 Essential (primary) hypertension: Secondary | ICD-10-CM

## 2024-06-28 MED ORDER — AMLODIPINE BESYLATE 5 MG PO TABS
5.0000 mg | ORAL_TABLET | Freq: Every day | ORAL | 1 refills | Status: AC
  Filled 2024-06-28: qty 90, 90d supply, fill #0
  Filled 2024-08-11 – 2024-09-27 (×2): qty 90, 90d supply, fill #1

## 2024-06-28 MED ORDER — CLOBETASOL PROPIONATE 0.05 % EX CREA
TOPICAL_CREAM | CUTANEOUS | 0 refills | Status: AC
  Filled 2024-06-28: qty 60, 30d supply, fill #0

## 2024-07-14 ENCOUNTER — Other Ambulatory Visit: Payer: Self-pay

## 2024-07-14 DIAGNOSIS — E079 Disorder of thyroid, unspecified: Secondary | ICD-10-CM | POA: Insufficient documentation

## 2024-07-14 DIAGNOSIS — I77819 Aortic ectasia, unspecified site: Secondary | ICD-10-CM | POA: Insufficient documentation

## 2024-07-14 DIAGNOSIS — Z720 Tobacco use: Secondary | ICD-10-CM | POA: Insufficient documentation

## 2024-07-14 DIAGNOSIS — E785 Hyperlipidemia, unspecified: Secondary | ICD-10-CM | POA: Insufficient documentation

## 2024-07-20 ENCOUNTER — Ambulatory Visit

## 2024-07-20 ENCOUNTER — Encounter: Payer: Self-pay | Admitting: Cardiology

## 2024-07-20 ENCOUNTER — Other Ambulatory Visit (HOSPITAL_BASED_OUTPATIENT_CLINIC_OR_DEPARTMENT_OTHER): Payer: Self-pay

## 2024-07-20 ENCOUNTER — Ambulatory Visit: Attending: Cardiology | Admitting: Cardiology

## 2024-07-20 ENCOUNTER — Ambulatory Visit (INDEPENDENT_AMBULATORY_CARE_PROVIDER_SITE_OTHER)

## 2024-07-20 VITALS — BP 134/70 | HR 78 | Ht 62.6 in | Wt 184.0 lb

## 2024-07-20 DIAGNOSIS — R0609 Other forms of dyspnea: Secondary | ICD-10-CM | POA: Diagnosis not present

## 2024-07-20 DIAGNOSIS — E538 Deficiency of other specified B group vitamins: Secondary | ICD-10-CM | POA: Diagnosis not present

## 2024-07-20 DIAGNOSIS — I351 Nonrheumatic aortic (valve) insufficiency: Secondary | ICD-10-CM | POA: Insufficient documentation

## 2024-07-20 DIAGNOSIS — E782 Mixed hyperlipidemia: Secondary | ICD-10-CM | POA: Diagnosis not present

## 2024-07-20 DIAGNOSIS — I10 Essential (primary) hypertension: Secondary | ICD-10-CM | POA: Diagnosis not present

## 2024-07-20 MED ORDER — METOPROLOL TARTRATE 100 MG PO TABS
ORAL_TABLET | ORAL | 0 refills | Status: DC
  Filled 2024-07-20: qty 1, 1d supply, fill #0

## 2024-07-20 MED ORDER — CYANOCOBALAMIN 1000 MCG/ML IJ SOLN
1000.0000 ug | Freq: Once | INTRAMUSCULAR | Status: AC
  Administered 2024-07-20: 1000 ug via INTRAMUSCULAR

## 2024-07-20 NOTE — Progress Notes (Signed)
 Pt here today for monthly B12 injection per [name].  Cyanocobalamin  1mL injected into L deltoid IM. Pt tolerated injection well.   Next in 1 month.

## 2024-07-20 NOTE — Patient Instructions (Addendum)
 Medication Instructions:  Your physician recommends that you continue on your current medications as directed. Please refer to the Current Medication list given to you today.  *If you need a refill on your cardiac medications before your next appointment, please call your pharmacy*   Lab Work: None ordered If you have labs (blood work) drawn today and your tests are completely normal, you will receive your results only by: MyChart Message (if you have MyChart) OR A paper copy in the mail If you have any lab test that is abnormal or we need to change your treatment, we will call you to review the results.   Testing/Procedures:   Your cardiac CT will be scheduled at one of the below locations:   [facility] [address] [place], [place] [address] [phone]   If scheduled at [facility], please arrive 30 minutes early for check-in and test prep.  Please follow these instructions carefully (unless otherwise directed):   On the Night Before the Test: Be sure to Drink plenty of water. Do not consume any caffeinated/decaffeinated beverages or chocolate 12 hours prior to your test. Do not take any antihistamines 12 hours prior to your test.  On the Day of the Test: Drink plenty of water until 1 hour prior to the test. Do not eat any food 1 hour prior to test. You may take your regular medications prior to the test.  Take metoprolol  (Lopressor ) 100 mg two hours prior to test. FEMALES- please wear underwire-free bra if available, avoid dresses & tight clothing       After the Test: Drink plenty of water. After receiving IV contrast, you may experience a mild flushed feeling. This is normal. On occasion, you may experience a mild rash up to 24 hours after the test. This is not dangerous. If this occurs, you can take Benadryl 25 mg, Zyrtec, Claritin, or Allegra and increase your fluid intake. (Patients taking Tikosyn should avoid Benadryl, and may take  Zyrtec, Claritin, or Allegra) If you experience trouble breathing, this can be serious. If it is severe call 911 IMMEDIATELY. If it is mild, please call our office.  We will call to schedule your test 2-4 weeks out understanding that some insurance companies will need an authorization prior to the service being performed.   For more information and frequently asked questions, please visit our website : [email or web address]  For non-scheduling related questions, please contact the cardiac imaging nurse navigator should you have any questions/concerns: Cardiac Imaging Nurse Navigators Direct Office Dial: [phone]   For scheduling needs, including cancellations and rescheduling, please call Brittany, [phone].    Follow-Up: At [facility], you and your health needs are our priority.  As part of our continuing mission to provide you with exceptional heart care, we have created designated Provider Care Teams.  These Care Teams include your primary Cardiologist (physician) and Advanced Practice Providers (APPs -  Physician Assistants and Nurse Practitioners) who all work together to provide you with the care you need, when you need it.  We recommend signing up for the patient portal called MyChart.  Sign up information is provided on this After Visit Summary.  MyChart is used to connect with patients for Virtual Visits (Telemedicine).  Patients are able to view lab/test results, encounter notes, upcoming appointments, etc.  Non-urgent messages can be sent to your provider as well.   To learn more about what you can do with MyChart, go to [email or web address].    Your  next appointment:   9 month(s)  The format for your next appointment:   In Person  Provider:   Jennifer Crape, MD    Other Instructions none  Important Information About Sugar

## 2024-07-20 NOTE — Progress Notes (Signed)
 Cardiology Office Note:    Date:  07/20/2024   ID:  Sheryl [name], DOB 08-Mar-1946, MRN [id number]  PCP:  Sheryl Setter, NP  Cardiologist:  Sheryl JONELLE Crape, MD   Referring MD: Sheryl Setter, NP    ASSESSMENT:    1. Mixed dyslipidemia   2. Primary hypertension   3. DOE (dyspnea on exertion)   4. Mild aortic regurgitation    PLAN:    In order of problems listed above:  Primary prevention stressed with the patient.  Importance of compliance with diet medication stressed and patient verbalized standing. Dyspnea on exertion: She does not appear to be having problems with asthma at this point.  Lungs are clear.  I most certainly would like to evaluate her for any ischemic substrate from a coronary standpoint.  I suggested CT coronary angiography and she is agreeable. Mild aortic regurgitation: Stable and we will continue to monitor.  Echo reports were discussed with the patient at length. Essential hypertension: Blood pressure is stable and diet was emphasized. Mixed dyslipidemia and diabetes mellitus: Numbers were reviewed and discussed with the patient at length. Obesity: Weight reduction stressed diet emphasized and she promises to do better.  Risks of obesity explained. Patient will be seen in follow-up appointment in 6 months or earlier if the patient has any concerns.    Medication Adjustments/Labs and Tests Ordered: Current medicines are reviewed at length with the patient today.  Concerns regarding medicines are outlined above.  Orders Placed This Encounter  Procedures   EKG 12-Lead   No orders of the defined types were placed in this encounter.    History of Present Illness:    Sheryl [name] is a 78 y.o. female who is being seen today for the evaluation of dyspnea on exertion at the request of Sheryl Setter, NP.  Patient is a pleasant 78 year old female.  She has past medical history of essential hypertension and mixed dyslipidemia.  She  has history of bronchial asthma.  She has had asthma issues in the past but she tells me that this is different.  She mentions to me that her asthma is well-controlled with medications.  She is having dyspnea on exertion and it is progressively getting worse.  No orthopnea or PND no chest pain.  No syncope.  No palpitations.  At the time of my evaluation, the patient is alert awake oriented and in no distress.  Past Medical History:  Diagnosis Date   Adverse reaction to COVID-19 vaccine 02/15/2021   Allergic rhinitis 09/10/2009   Qualifier: Diagnosis of   By: Daryl FNP, [name] S        Anemia    pernicious   Aortic ectasia    infrarenal abdomina aorta 2.6 cm   Asthma 12/26/2009   Qualifier: Diagnosis of   By: Daryl FNP, [name] S        Atherosclerotic vascular disease 09/13/2018   B12 deficiency 05/17/2021   Centrilobular emphysema (HCC) 01/29/2017   FEV1 50% 01/2017     Colon cancer (HCC) 2012   Degenerative mitral valve prolapse 06/02/2024   Depression 09/10/2009   Qualifier: Diagnosis of   By: Daryl FNP, [name] S        Diabetes type 2, controlled (HCC) 09/10/2009   Qualifier: Diagnosis of   By: Daryl FNP, [name] S        Ectatic aorta 03/03/2016   2.6 CM, (infrarenal) Needs follow up ultrasound 7/23     Edema 02/19/2024   Fatty liver 01/19/2015  Fatty liver    General medical examination 12/25/2010   Overview:   Last Assessment & Plan:   78 yr old female presents today for her Medicare wellness exam. She was counseled on exercise and weight loss. She will be referred for screening colonoscopy. Other health care maintenance is up-to-date.     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)   History of COVID-19 05/09/2022   History of tobacco abuse 06/05/2009   Qualifier: Diagnosis of   By: Daryl FNP, [name] S        Hyperlipidemia    Hypertension     Hypothyroidism 06/05/2009   Qualifier: Diagnosis of   By: Anice CMA, Darlene         Insomnia 12/25/2010   Mixed dyslipidemia 06/05/2009   >>OVERVIEW FOR HYPERLIPIDEMIA WRITTEN ON 10/02/2010  7:18 PM BY INTERFACE, PROBLEM LIST IN     Qualifier: Diagnosis of   By: Anice CMA, Darlene         Overweight 09/13/2018   Pericarditis 07/26/2012   PERNICIOUS ANEMIA 06/05/2009   Qualifier: Diagnosis of   By: Daryl FNP, [name] S        Psoriasis 12/29/2011   Rheumatoid arthritis (HCC) 06/05/2009   Qualifier: Diagnosis of   By: Daryl FNP, [name] S        Right ankle swelling 10/17/2022   Sjogren's syndrome 03/17/2011   Sprain of right ankle 10/13/2023   Thyroid  disease    hypothyroid   Tobacco abuse    Vaginal atrophy 01/08/2015   Ventral hernia 09/13/2018   Vitamin D  deficiency 05/15/2010   Qualifier: Diagnosis of   By: Daryl FNP, Eleanor RAMAN         Past Surgical History:  Procedure Laterality Date   CHOLECYSTECTOMY  10/23/2016   COLON SURGERY  8/.23/13   tumor removed from sigmoid colon.   DILATION AND CURETTAGE OF UTERUS  1975   HERNIA REPAIR  10/23/2016   KNEE SURGERY  2006   arthroscopic left knee   KNEE SURGERY  2001   right knee    Current Medications: Current Meds  Medication Sig   albuterol  (VENTOLIN  HFA) 108 (90 Base) MCG/ACT inhaler Inhale 2 puffs into the lungs every 6 (six) hours as needed for wheezing or shortness of breath.   amLODipine  (NORVASC ) 5 MG tablet Take 1 tablet (5 mg total) by mouth daily.   aspirin  81 MG tablet Take 81 mg by mouth daily.   clobetasol  cream (TEMOVATE ) 0.05 % Apply to affected areas 2 times a day for 2 weeks on, and 1 week off. Repeat cycle as needed.   Fluticasone -Umeclidin-Vilant (TRELEGY ELLIPTA ) 200-62.5-25 MCG/ACT AEPB Inhale 1 puff into the lungs daily.   ibuprofen  (ADVIL ) 600 MG tablet Take 1 tablet (600 mg total) by mouth every 6-8 hours as needed for pain and swelling.   levothyroxine  (SYNTHROID ) 100 MCG tablet  Take 1 tablet (100 mcg total) by mouth daily before breakfast.   losartan  (COZAAR ) 25 MG tablet Take 1 tablet (25 mg total) by mouth daily.   montelukast  (SINGULAIR ) 10 MG tablet Take 1 tablet (10 mg total) by mouth at bedtime.   multivitamin (THERAGRAN) per tablet Take 1 tablet by mouth daily.   NON FORMULARY TUMERIC MILK.   NON FORMULARY CBD. Derivative of marijuana without the marijuana.   pantoprazole  (PROTONIX ) 40 MG tablet Take 1 tablet (40 mg  total) by mouth daily.   Probiotic Product (PROBIOTIC DAILY PO) Take 1 capsule by mouth daily.   VOLTAREN  1 % GEL APPLY 2 GRAMS TOPICALLY 4 TIMES AS NEEDED     Allergies:   Levofloxacin, Mold extract [trichophyton], Ativan [lorazepam], Cefuroxime axetil, Hydrocodone  bit-homatrop mbr, Lisinopril , Rosuvastatin calcium, Statins, Zetia  [ezetimibe ], and Hydrocodone -acetaminophen   Social History   Socioeconomic History   Marital status: Single    Spouse name: Not on file   Number of children: 1   Years of education: Not on file   Highest education level: Associate degree: academic program  Occupational History   Occupation: unemployed    Employer: DISABLED  Tobacco Use   Smoking status: Former    Current packs/day: 0.00    Types: Cigarettes    Quit date: 04/25/2021    Years since quitting: 3.2   Smokeless tobacco: Never   Tobacco comments:    smokes occasionally but not everyday  Substance and Sexual Activity   Alcohol use: Yes    Alcohol/week: 1.0 standard drink of alcohol    Types: 1 Glasses of wine per week    Comment: occasionally   Drug use: Yes    Comment: Smokes medical grade marijuana 2 puffs 3 x weekly for rheumatoid arthritis.per pt   Sexual activity: Never  Other Topics Concern   Not on file  Social History Narrative   Regular exercise:  Stretching exercises. Resistance bands   Caffeine: 1 mug (2cus) daily.      Social Drivers of Corporate Investment Banker Strain: Low Risk  (02/17/2024)   Overall Financial Resource  Strain (CARDIA)    Difficulty of Paying Living Expenses: Not hard at all  Food Insecurity: No Food Insecurity (02/17/2024)   Hunger Vital Sign    Worried About Running Out of Food in the Last Year: Never true    Ran Out of Food in the Last Year: Never true  Transportation Needs: No Transportation Needs (02/17/2024)   PRAPARE - Administrator, Civil Service (Medical): No    Lack of Transportation (Non-Medical): No  Physical Activity: Inactive (02/17/2024)   Exercise Vital Sign    Days of Exercise per Week: 0 days    Minutes of Exercise per Session: Not on file  Stress: No Stress Concern Present (02/17/2024)   [employer] of Occupational Health - Occupational Stress Questionnaire    Feeling of Stress: Only a little  Social Connections: Moderately Isolated (02/17/2024)   Social Connection and Isolation Panel    Frequency of Communication with Friends and Family: More than three times a week    Frequency of Social Gatherings with Friends and Family: Once a week    Attends Religious Services: Never    Database Administrator or Organizations: Yes    Attends Engineer, Structural: More than 4 times per year    Marital Status: Divorced     Family History: The patient's family history includes Diabetes in an other family member; Heart disease in an other family member; Hyperlipidemia in an other family member; Stroke in an other family member.  ROS:   Please see the history of present illness.    All other systems reviewed and are negative.  EKGs/Labs/Other Studies Reviewed:    The following studies were reviewed today:  EKG Interpretation Date/Time:  Wednesday July 20 2024 13:53:30 EST Ventricular Rate:  78 PR Interval:  190 QRS Duration:  76 QT Interval:  422 QTC Calculation: 481 R Axis:  43  Text Interpretation: Normal sinus rhythm Low voltage QRS Nonspecific T wave abnormality When compared with ECG of 11-Jul-2012 02:14, Nonspecific T wave  abnormality no longer evident in Inferior leads Nonspecific T wave abnormality has replaced inverted T waves in Lateral leads Confirmed by Edwyna Backers [phone]) on 07/20/2024 2:05:37 PM     Recent Labs: 02/19/2024: ALT 28; Hemoglobin 14.5; Platelets 148; TSH 1.22 05/20/2024: BUN 15; Creatinine, Ser 0.98; Potassium 4.9; Sodium 135  Recent Lipid Panel    Component Value Date/Time   CHOL 193 05/20/2024 1141   TRIG 180.0 (H) 05/20/2024 1141   HDL 31.60 (L) 05/20/2024 1141   CHOLHDL 6 05/20/2024 1141   VLDL 36.0 05/20/2024 1141   LDLCALC 125 (H) 05/20/2024 1141   LDLCALC 116 (H) 04/27/2020 1108   LDLDIRECT 129.0 12/04/2017 1107    Physical Exam:    VS:  BP 134/70   Pulse 78   Ht 5' 2.6 (1.59 m)   Wt 184 lb 0.6 oz (83.5 kg)   SpO2 94%   BMI 33.02 kg/m     Wt Readings from Last 3 Encounters:  07/20/24 184 lb 0.6 oz (83.5 kg)  05/20/24 183 lb (83 kg)  02/19/24 185 lb (83.9 kg)     GEN: Patient is in no acute distress HEENT: Normal NECK: No JVD; No carotid bruits LYMPHATICS: No lymphadenopathy CARDIAC: S1 S2 regular, 2/6 systolic murmur at the apex. RESPIRATORY:  Clear to auscultation without rales, wheezing or rhonchi  ABDOMEN: Soft, non-tender, non-distended MUSCULOSKELETAL:  No edema; No deformity  SKIN: Warm and dry NEUROLOGIC:  Alert and oriented x 3 PSYCHIATRIC:  Normal affect    Signed, Backers JONELLE Edwyna, MD  07/20/2024 2:22 PM    [facility]

## 2024-07-25 ENCOUNTER — Telehealth: Payer: Self-pay

## 2024-07-25 NOTE — Telephone Encounter (Signed)
 Copied from CRM #[id number]. Topic: Clinical - Medical Advice >> Jul 25, 2024 11:45 AM Berneda FALCON wrote: Reason for CRM: Patient states she had a Cardiology appt last Wednesday and feels the DR. Did not know anything about her, was condescending, and cut her off when she tried to talk. States she does not feel safe with this patient, and that he only spent 9 min with her. She states she was nervous because she felt uncomfortable with him and was having severe breathing issues-echo showed she had mitral valve malfunction-and he did not know about this. States he wrote things in the notes that they did not discuss and that he ordered a CAT scan for the heart.  Patient was prescribed side effects with new medication to lower blood pressure-was nervous about the infection around the heart as a side effect and he dismissed her concerns. Medication: metoprolol  tartrate (LOPRESSOR ) 100 MG tablet  Can she please get a callback to discuss her concerns about this (Can also message patient on MyChart if needed) Patient call back number is [phone]

## 2024-07-26 DIAGNOSIS — M069 Rheumatoid arthritis, unspecified: Secondary | ICD-10-CM | POA: Diagnosis not present

## 2024-07-26 DIAGNOSIS — E119 Type 2 diabetes mellitus without complications: Secondary | ICD-10-CM | POA: Diagnosis not present

## 2024-07-26 DIAGNOSIS — D510 Vitamin B12 deficiency anemia due to intrinsic factor deficiency: Secondary | ICD-10-CM | POA: Diagnosis not present

## 2024-07-26 DIAGNOSIS — E663 Overweight: Secondary | ICD-10-CM | POA: Diagnosis not present

## 2024-07-26 DIAGNOSIS — I10 Essential (primary) hypertension: Secondary | ICD-10-CM | POA: Diagnosis not present

## 2024-07-26 DIAGNOSIS — J432 Centrilobular emphysema: Secondary | ICD-10-CM | POA: Diagnosis not present

## 2024-07-26 DIAGNOSIS — I341 Nonrheumatic mitral (valve) prolapse: Secondary | ICD-10-CM | POA: Diagnosis not present

## 2024-07-26 DIAGNOSIS — I7090 Unspecified atherosclerosis: Secondary | ICD-10-CM | POA: Diagnosis not present

## 2024-07-26 DIAGNOSIS — E039 Hypothyroidism, unspecified: Secondary | ICD-10-CM | POA: Diagnosis not present

## 2024-07-26 DIAGNOSIS — J45909 Unspecified asthma, uncomplicated: Secondary | ICD-10-CM | POA: Diagnosis not present

## 2024-07-26 DIAGNOSIS — M19071 Primary osteoarthritis, right ankle and foot: Secondary | ICD-10-CM | POA: Diagnosis not present

## 2024-07-26 DIAGNOSIS — F32A Depression, unspecified: Secondary | ICD-10-CM | POA: Diagnosis not present

## 2024-08-03 ENCOUNTER — Telehealth: Payer: Self-pay

## 2024-08-03 NOTE — Telephone Encounter (Signed)
 Copied from CRM #[id number]. Topic: General - Other >> Aug 03, 2024 12:21 PM Thersia BROCKS wrote: Reason for CRM: [facility] signature plan of care wanted a call to confirms provider signature would like a callback  [phone]

## 2024-08-07 ENCOUNTER — Other Ambulatory Visit: Payer: Self-pay | Admitting: Family

## 2024-08-08 ENCOUNTER — Other Ambulatory Visit (HOSPITAL_BASED_OUTPATIENT_CLINIC_OR_DEPARTMENT_OTHER): Payer: Self-pay

## 2024-08-08 ENCOUNTER — Encounter (HOSPITAL_COMMUNITY): Payer: Self-pay

## 2024-08-08 ENCOUNTER — Telehealth (HOSPITAL_COMMUNITY): Payer: Self-pay | Admitting: *Deleted

## 2024-08-08 ENCOUNTER — Other Ambulatory Visit: Payer: Self-pay

## 2024-08-08 MED ORDER — LEVOTHYROXINE SODIUM 100 MCG PO TABS
100.0000 ug | ORAL_TABLET | Freq: Every day | ORAL | 1 refills | Status: AC
  Filled 2024-08-08: qty 90, 90d supply, fill #0
  Filled 2024-09-06 – 2024-09-27 (×2): qty 90, 90d supply, fill #1

## 2024-08-08 NOTE — Telephone Encounter (Signed)

## 2024-08-09 ENCOUNTER — Ambulatory Visit: Payer: Self-pay | Admitting: Cardiology

## 2024-08-09 ENCOUNTER — Telehealth: Payer: Self-pay

## 2024-08-09 ENCOUNTER — Ambulatory Visit (HOSPITAL_BASED_OUTPATIENT_CLINIC_OR_DEPARTMENT_OTHER): Admission: RE | Admit: 2024-08-09 | Discharge: 2024-08-09 | Attending: Cardiology | Admitting: Cardiology

## 2024-08-09 ENCOUNTER — Encounter (HOSPITAL_BASED_OUTPATIENT_CLINIC_OR_DEPARTMENT_OTHER): Payer: Self-pay

## 2024-08-09 DIAGNOSIS — I351 Nonrheumatic aortic (valve) insufficiency: Secondary | ICD-10-CM | POA: Insufficient documentation

## 2024-08-09 DIAGNOSIS — R0609 Other forms of dyspnea: Secondary | ICD-10-CM | POA: Diagnosis present

## 2024-08-09 DIAGNOSIS — R931 Abnormal findings on diagnostic imaging of heart and coronary circulation: Secondary | ICD-10-CM | POA: Insufficient documentation

## 2024-08-09 LAB — POCT I-STAT CREATININE: Creatinine, Ser: 1.1 mg/dL — ABNORMAL HIGH (ref 0.44–1.00)

## 2024-08-09 MED ORDER — IOHEXOL 350 MG/ML SOLN
100.0000 mL | Freq: Once | INTRAVENOUS | Status: AC | PRN
  Administered 2024-08-09: 13:00:00 95 mL via INTRAVENOUS

## 2024-08-09 MED ORDER — NITROGLYCERIN 0.4 MG SL SUBL
0.8000 mg | SUBLINGUAL_TABLET | Freq: Once | SUBLINGUAL | Status: AC
  Administered 2024-08-09: 12:00:00 0.8 mg via SUBLINGUAL

## 2024-08-09 NOTE — Telephone Encounter (Signed)
 Please advise patient that this measures her kidney function which is required before they administer the contrast. It was similar to her previous kidney function test which was very mildly decreased.  I recommend that she be sure to stay well hydrated and avoid anti-inflammatories such as aleve/motrin  which can be harsh on kidneys.

## 2024-08-09 NOTE — Telephone Encounter (Signed)
 Copied from CRM [id number]. Topic: Clinical - Lab/Test Results >> Aug 09, 2024  2:42 PM Franky GRADE wrote: Reason for CRM: Patient is calling because she received an I stat during her CT scan that came up abnormal. She is concerned because she wasn't aware of any test beside he CT scan being done or what an I State.

## 2024-08-10 ENCOUNTER — Other Ambulatory Visit: Payer: Self-pay | Admitting: Cardiology

## 2024-08-10 ENCOUNTER — Ambulatory Visit (HOSPITAL_BASED_OUTPATIENT_CLINIC_OR_DEPARTMENT_OTHER)
Admission: RE | Admit: 2024-08-10 | Discharge: 2024-08-10 | Disposition: A | Discharge status: Home / Self Care | Source: Ambulatory Visit | Attending: Cardiology | Admitting: Cardiology

## 2024-08-10 ENCOUNTER — Ambulatory Visit: Payer: Self-pay | Admitting: Cardiology

## 2024-08-10 ENCOUNTER — Other Ambulatory Visit (HOSPITAL_BASED_OUTPATIENT_CLINIC_OR_DEPARTMENT_OTHER): Payer: Self-pay

## 2024-08-10 DIAGNOSIS — R931 Abnormal findings on diagnostic imaging of heart and coronary circulation: Secondary | ICD-10-CM

## 2024-08-10 MED ORDER — NITROGLYCERIN 0.4 MG SL SUBL
0.4000 mg | SUBLINGUAL_TABLET | SUBLINGUAL | 6 refills | Status: AC | PRN
  Filled 2024-08-10: qty 25, 1d supply, fill #0
  Filled 2024-08-10: qty 25, 8d supply, fill #0

## 2024-08-10 NOTE — Telephone Encounter (Signed)
 Pt declined appointment in [place] and states that she will have her PCP refer her to a cardiologist closer to home. Pt verbalized understanding of seriousness of the CT findings and stated that she would call back if anything changed. NTG sent and pt verbalized understanding of use.

## 2024-08-10 NOTE — Telephone Encounter (Signed)
 Patient notified of this information, she verbalized understanding

## 2024-08-10 NOTE — Progress Notes (Signed)
 FFR order

## 2024-08-11 ENCOUNTER — Other Ambulatory Visit: Payer: Self-pay | Admitting: Family

## 2024-08-11 ENCOUNTER — Other Ambulatory Visit (HOSPITAL_BASED_OUTPATIENT_CLINIC_OR_DEPARTMENT_OTHER): Payer: Self-pay

## 2024-08-11 ENCOUNTER — Other Ambulatory Visit: Payer: Self-pay

## 2024-08-11 MED ORDER — MONTELUKAST SODIUM 10 MG PO TABS
10.0000 mg | ORAL_TABLET | Freq: Every day | ORAL | 0 refills | Status: DC
  Filled 2024-08-11: qty 90, 90d supply, fill #0

## 2024-08-11 MED ORDER — LOSARTAN POTASSIUM 25 MG PO TABS
25.0000 mg | ORAL_TABLET | Freq: Every day | ORAL | 0 refills | Status: DC
  Filled 2024-08-11: qty 90, 90d supply, fill #0

## 2024-08-11 NOTE — Telephone Encounter (Signed)
 Patient stated she is following-up with RN Melene

## 2024-08-11 NOTE — Telephone Encounter (Signed)
 Pt called to see how soon she needed to be seen and advised sooner is best. Advised that I will send to the [place] office to see if they can get the pt in as she no longer drives and getting transportation to [place] would be difficult.

## 2024-08-12 NOTE — Progress Notes (Unsigned)
 " Cardiology Office Note:  .   Date:  08/16/2024  ID:  Sheryl [name], DOB 1946-02-04, MRN [id number] PCP: Daryl Setter, NP  [facility] Providers Cardiologist:  None   History of Present Illness: .    Chief Complaint  Patient presents with   Follow-up    Sheryl [name] is a 78 y.o. female with below history who presents for follow-up of CAD.   History of Present Illness   Sheryl [name] is a 78 year old female with coronary artery disease who presents for follow-up after a coronary CTA showed severe stenosis in the mid circumflex artery. She is accompanied by her niece. She was referred by Doctor Ravencar for initial evaluation.  She has a history of coronary artery disease with a recent coronary CTA showing severe stenosis in the mid circumflex artery and a CT FFR of 0.72. She experiences significant shortness of breath on exertion, described as 'super short, short winded,' which she differentiates from her asthma symptoms. She denies chest pain but notes a fluttery feeling in her heart without pain.  Her hypertension has been present for about a year and a half, currently managed with amlodipine  5 mg and losartan  25 mg. She has tried statins for hyperlipidemia but experienced severe muscle aches, and Zetia  caused diarrhea, so she is not on any cholesterol medication currently.  She has a history of colon cancer, treated surgically without complications, and rheumatoid arthritis, which initially led to disability. She is currently retired.  She has a significant family history of heart disease; her father died of heart disease, and her mother died of a stroke. Most of her siblings had cancer.  She smokes two to three cigarettes a day and has been smoking since age 18. She has a history of moderate COPD diagnosed in 2018, with severe airway obstruction noted on spirometry. She uses Trelegy for asthma management, which is well-controlled unless exposed to  allergens like mold. She has not followed with a pulmonologist.   She denies alcohol and drug use, though she occasionally used marijuana in the past. She is divorced, has one son, and two granddaughters.  No chest pain, significant shortness of breath on exertion, denies current use of nitroglycerin , reports ankle swelling that resolves with rest.           Problem List CAD -severe mLCX (CT FFR 0.72) 2. HLD -T chol 193, HDL 31, LDL 125, TG 180 3. HTN    ROS: All other ROS reviewed and negative. Pertinent positives noted in the HPI.     Studies Reviewed: Sheryl [name]       TTE 06/01/2024  1. Left ventricular ejection fraction, by estimation, is 60 to 65%. Left  ventricular ejection fraction by 3D volume is 62 %. The left ventricle has  normal function. The left ventricle has no regional wall motion  abnormalities. Left ventricular diastolic   parameters are consistent with Grade I diastolic dysfunction (impaired  relaxation). Elevated left ventricular end-diastolic pressure. The average  left ventricular global longitudinal strain is -21.2 %. The global  longitudinal strain is normal.   2. Right ventricular systolic function is normal. The right ventricular  size is normal.   3. The mitral valve is degenerative. No evidence of mitral valve  regurgitation. No evidence of mitral stenosis. Moderate mitral annular  calcification.   4. The aortic valve is tricuspid. Aortic valve regurgitation is mild.  Aortic valve sclerosis/calcification is present, without any evidence of  aortic stenosis.  5. The inferior vena cava is normal in size with greater than 50%  respiratory variability, suggesting right atrial pressure of 3 mmHg.   CCTA 08/10/2024 IMPRESSION: 1. Coronary calcium score of 638. This was 25 percentile for age and sex matched control.   2. Normal coronary origin with right dominance.   3. CAD-RADS 4 Severe stenosis. (70-99%) mid CX. Cardiac catheterization or CT FFR is  recommended. Consider symptom-guided anti-ischemic pharmacotherapy as well as risk factor modification per guideline directed care.   4.  Severe mitral annulus calcifications noted   5.  Advanced atherosclerosis of the descending thoracic aorta.  CT FFR 08/10/2024 1. LM FFR: 0.99 2. LAD FFR: Prox 0.99, mid 0.92, distal 0.87 3. CX FFR: Prox 0.99, mid 0.72, distal 0.69 4. RCA FFR: Prox 0.99, mid 0.88, distal 085   Physical Exam:   VS:  BP 136/80   Pulse 82   Ht 5' 2 (1.575 m)   Wt 185 lb (83.9 kg)   SpO2 99%   BMI 33.84 kg/m    Wt Readings from Last 3 Encounters:  08/16/24 185 lb (83.9 kg)  07/20/24 184 lb 0.6 oz (83.5 kg)  05/20/24 183 lb (83 kg)    GEN: Well nourished, well developed in no acute distress NECK: No JVD; No carotid bruits CARDIAC: RRR, no murmurs, rubs, gallops RESPIRATORY:  Clear to auscultation without rales, wheezing or rhonchi  ABDOMEN: Soft, non-tender, non-distended EXTREMITIES:  No edema; No deformity  ASSESSMENT AND PLAN: .   Assessment and Plan    Coronary artery disease with stable angina Severe stenosis in mid circumflex artery with CT FFR positive at 0.72. Symptoms of exertional dyspnea likely related to asthma or COPD. Normal heart function on echocardiogram. Medications chosen over stenting after shared decision-making. - She was offered medical management versus invasive strategy with PCI. For now, she would like to try medical management. Her symptoms to me are more likely COPD related. I will see her back closely.  - Continue aspirin  therapy. - Continue norvasc  5 mg daily. Add imdur  30 mg daily. No BB given COPD.  - Scheduled follow-up in six weeks to assess response to medication. - Discussed potential for catheterization and stenting if symptoms persist or worsen.  Chronic obstructive pulmonary disease Severe COPD with exertional dyspnea. Symptoms likely more related to COPD than coronary artery disease. Need for pulmonary evaluation  discussed. - Referred to pulmonologist for further evaluation and management. - Continue Trelegy for asthma control.  Mixed hyperlipidemia Elevated LDL cholesterol with previous intolerance to statins and Zetia . Discussed importance of lowering LDL due to heart disease and smoking history. Recommended injectable cholesterol medications. - Referred to pharmacist for consultation on injectable cholesterol medications. - Continue to monitor cholesterol levels and adjust treatment as necessary.  Tobacco use disorder Long-term tobacco use with current smoking of 2-3 cigarettes per day. Discussed impact on cardiovascular and pulmonary health. - 3 min smoking cessation counseling provided in office today. - Discussed the risks of continued smoking and benefits of quitting.  Hypertension Managed with amlodipine  and losartan . Blood pressure control crucial due to coronary artery disease and COPD. - Continue current antihypertensive regimen with amlodipine  and losartan .                Follow-up: Return in about 6 weeks (around 09/27/2024).  Signed, Darryle DASEN. Barbaraann, MD, [facility]  [facility]  [address] [place], [place] [address] [phone]  10:53 AM   "

## 2024-08-15 ENCOUNTER — Other Ambulatory Visit: Payer: Self-pay

## 2024-08-15 ENCOUNTER — Other Ambulatory Visit (HOSPITAL_BASED_OUTPATIENT_CLINIC_OR_DEPARTMENT_OTHER): Payer: Self-pay

## 2024-08-16 ENCOUNTER — Encounter: Payer: Self-pay | Admitting: Cardiovascular Disease

## 2024-08-16 ENCOUNTER — Ambulatory Visit (INDEPENDENT_AMBULATORY_CARE_PROVIDER_SITE_OTHER): Admitting: *Deleted

## 2024-08-16 ENCOUNTER — Ambulatory Visit: Admitting: Cardiovascular Disease

## 2024-08-16 ENCOUNTER — Other Ambulatory Visit (HOSPITAL_BASED_OUTPATIENT_CLINIC_OR_DEPARTMENT_OTHER): Payer: Self-pay

## 2024-08-16 ENCOUNTER — Ambulatory Visit: Admitting: Cardiology

## 2024-08-16 VITALS — BP 136/80 | HR 82 | Ht 62.0 in | Wt 185.0 lb

## 2024-08-16 DIAGNOSIS — I25118 Atherosclerotic heart disease of native coronary artery with other forms of angina pectoris: Secondary | ICD-10-CM | POA: Diagnosis not present

## 2024-08-16 DIAGNOSIS — Z720 Tobacco use: Secondary | ICD-10-CM | POA: Insufficient documentation

## 2024-08-16 DIAGNOSIS — R0609 Other forms of dyspnea: Secondary | ICD-10-CM | POA: Diagnosis not present

## 2024-08-16 DIAGNOSIS — E538 Deficiency of other specified B group vitamins: Secondary | ICD-10-CM

## 2024-08-16 DIAGNOSIS — E782 Mixed hyperlipidemia: Secondary | ICD-10-CM | POA: Diagnosis not present

## 2024-08-16 DIAGNOSIS — I150 Renovascular hypertension: Secondary | ICD-10-CM | POA: Insufficient documentation

## 2024-08-16 MED ORDER — ISOSORBIDE MONONITRATE ER 30 MG PO TB24
30.0000 mg | ORAL_TABLET | Freq: Every day | ORAL | 3 refills | Status: AC
  Filled 2024-08-16: qty 90, 90d supply, fill #0

## 2024-08-16 MED ORDER — CYANOCOBALAMIN 1000 MCG/ML IJ SOLN
1000.0000 ug | Freq: Once | INTRAMUSCULAR | Status: AC
  Administered 2024-08-16: 1000 ug via INTRAMUSCULAR

## 2024-08-16 NOTE — Progress Notes (Signed)
 Pt here for monthly b12 injection per pcp order. Given LD without complications.

## 2024-08-16 NOTE — Patient Instructions (Addendum)
 Medication Instructions:  Your physician has recommended you make the following change in your medication:   -Start taking isosorbide  mononitrate (Imdur ) 30mg  once daily.  *If you need a refill on your cardiac medications before your next appointment, please call your pharmacy*   Follow-Up: At [facility], you and your health needs are our priority.  As part of our continuing mission to provide you with exceptional heart care, our providers are all part of one team.  This team includes your primary Cardiologist (physician) and Advanced Practice Providers or APPs (Physician Assistants and Nurse Practitioners) who all work together to provide you with the care you need, when you need it.  Your next appointment:   6 week(s)  Provider:   Darryle T. O'Neal, MD   We recommend signing up for the patient portal called MyChart.  Sign up information is provided on this After Visit Summary.  MyChart is used to connect with patients for Virtual Visits (Telemedicine).  Patients are able to view lab/test results, encounter notes, upcoming appointments, etc.  Non-urgent messages can be sent to your provider as well.   To learn more about what you can do with MyChart, go to [email or web address].   Other Instructions We will schedule you an appointment to see a clinical PharmD to discuss cholesterol therapy.

## 2024-08-17 ENCOUNTER — Ambulatory Visit

## 2024-08-22 ENCOUNTER — Telehealth: Payer: Self-pay

## 2024-08-22 NOTE — Telephone Encounter (Signed)
 Copied from CRM #[id number]. Topic: Clinical - Lab/Test Results >> Aug 22, 2024 10:10 AM Sheryl [name] wrote: Reason for CRM: Patient is wanting to discuss issues with issue with cardiologists; wants to go over tests Echo and Heart CAT Scan. She also has questions about what condition valve is in and questions about starting new medication (Isosorbide  mononitartate). Pt is requesting a callback from [facility].    ----------------------------------------------------------------------- From previous Reason for Contact - Scheduling: Patient/patient representative is calling to schedule an appointment. Refer to attachments for appointment information.

## 2024-08-22 NOTE — Telephone Encounter (Signed)
 Patient notified PCP is out of the office this week, she insist she will like to hear from her directly and she will wait until next week.  Patient advised she should come in to see provider in person for consultation, no appointment available at this time on the schedule.

## 2024-08-29 NOTE — Telephone Encounter (Signed)
 Called patient to get her in tomorrow with pcp, patient reports she spoke to her son who works at a medical office and he answered a lot of the questions. She will wait until her next appointment to discuss with provider.

## 2024-09-06 ENCOUNTER — Other Ambulatory Visit: Payer: Self-pay | Admitting: Family

## 2024-09-06 ENCOUNTER — Other Ambulatory Visit (HOSPITAL_COMMUNITY): Payer: Self-pay

## 2024-09-06 ENCOUNTER — Telehealth: Payer: Self-pay

## 2024-09-06 ENCOUNTER — Other Ambulatory Visit (HOSPITAL_BASED_OUTPATIENT_CLINIC_OR_DEPARTMENT_OTHER): Payer: Self-pay

## 2024-09-06 MED ORDER — LOSARTAN POTASSIUM 25 MG PO TABS
25.0000 mg | ORAL_TABLET | Freq: Every day | ORAL | 1 refills | Status: AC
  Filled 2024-09-06 – 2024-09-27 (×2): qty 90, 90d supply, fill #0

## 2024-09-06 MED ORDER — PANTOPRAZOLE SODIUM 40 MG PO TBEC
40.0000 mg | DELAYED_RELEASE_TABLET | Freq: Every day | ORAL | 1 refills | Status: AC
  Filled 2024-09-06 – 2024-09-27 (×2): qty 90, 90d supply, fill #0

## 2024-09-06 MED ORDER — TRELEGY ELLIPTA 200-62.5-25 MCG/ACT IN AEPB
1.0000 | INHALATION_SPRAY | Freq: Every day | RESPIRATORY_TRACT | 1 refills | Status: AC
  Filled 2024-09-06 – 2024-09-27 (×2): qty 180, 90d supply, fill #0

## 2024-09-06 MED ORDER — MONTELUKAST SODIUM 10 MG PO TABS
10.0000 mg | ORAL_TABLET | Freq: Every day | ORAL | 1 refills | Status: AC
  Filled 2024-09-06 – 2024-09-27 (×2): qty 90, 90d supply, fill #0

## 2024-09-06 NOTE — Telephone Encounter (Signed)
 Pharmacy Patient Advocate Encounter  Insurance verification completed.   The patient is insured through [employer]   Ran test claim for Trelegy. Currently a quantity of 60 is a 30 day supply and the co-pay is $47 .  This test claim was processed through [facility]- copay amounts may vary at other pharmacies due to pharmacy/plan contracts, or as the patient moves through the different stages of their insurance plan.

## 2024-09-06 NOTE — Telephone Encounter (Signed)
Needs PA please.

## 2024-09-06 NOTE — Telephone Encounter (Signed)
 Copied from CRM [id number]. Topic: Clinical - Medication Prior Auth >> Sep 06, 2024  2:14 PM Alfonso ORN wrote: Reason for CRM: pt has new rx insurance [employer] drug plan told pt need PA for Fluticasone -Umeclidin-Vilant (TRELEGY ELLIPTA ) fax number: [phone] callback: [phone] Member id: [id number] group [id number] rx [id number]

## 2024-09-20 ENCOUNTER — Ambulatory Visit

## 2024-09-20 ENCOUNTER — Telehealth: Payer: Self-pay | Admitting: Family

## 2024-09-20 NOTE — Telephone Encounter (Signed)
 Copied from CRM #[id number]. Topic: General - Call Back - No Documentation >> Sep 19, 2024 11:31 AM Rea BROCKS wrote: Reason for CRM: Pt called in and cancelled her 1/27 for B12. She wanted to add her B12 shot to her 10/05/24 appt. I added to the notes but patient just wanted to be sure that it was okay to do it then.    [phone] (M)

## 2024-09-20 NOTE — Telephone Encounter (Signed)
 Patient reports is hard for her to come in due to transportation issues. Patient advised ok to wait, B12 levels mey drop some. She understands.

## 2024-09-26 ENCOUNTER — Other Ambulatory Visit (HOSPITAL_BASED_OUTPATIENT_CLINIC_OR_DEPARTMENT_OTHER): Payer: Self-pay

## 2024-09-28 ENCOUNTER — Other Ambulatory Visit: Payer: Self-pay

## 2024-09-28 ENCOUNTER — Other Ambulatory Visit (HOSPITAL_COMMUNITY): Payer: Self-pay

## 2024-10-05 ENCOUNTER — Ambulatory Visit: Admitting: Family

## 2024-10-10 ENCOUNTER — Ambulatory Visit: Admitting: Pharmacist Clinician (PhC)/ Clinical Pharmacy Specialist

## 2024-10-10 ENCOUNTER — Ambulatory Visit: Admitting: Cardiovascular Disease

## 2024-11-18 ENCOUNTER — Ambulatory Visit: Admitting: Family
